# Patient Record
Sex: Male | Born: 1940 | Race: White | Hispanic: No | Marital: Married | Smoking: Former smoker
Health system: Southern US, Community
[De-identification: ages and names within clinical notes are randomized; demographics above are authoritative.]

## PROBLEM LIST (undated history)

## (undated) DIAGNOSIS — E119 Type 2 diabetes mellitus without complications: Secondary | ICD-10-CM

## (undated) DIAGNOSIS — M6281 Muscle weakness (generalized): Secondary | ICD-10-CM

## (undated) DIAGNOSIS — E7800 Pure hypercholesterolemia, unspecified: Secondary | ICD-10-CM

## (undated) DIAGNOSIS — Z77098 Contact with and (suspected) exposure to other hazardous, chiefly nonmedicinal, chemicals: Secondary | ICD-10-CM

## (undated) DIAGNOSIS — Z7739 Contact with and (suspected) exposure to other war theater: Secondary | ICD-10-CM

## (undated) DIAGNOSIS — Z86010 Personal history of colonic polyps: Secondary | ICD-10-CM

## (undated) DIAGNOSIS — Z860101 Personal history of adenomatous and serrated colon polyps: Secondary | ICD-10-CM

## (undated) DIAGNOSIS — C4490 Unspecified malignant neoplasm of skin, unspecified: Secondary | ICD-10-CM

## (undated) DIAGNOSIS — I872 Venous insufficiency (chronic) (peripheral): Secondary | ICD-10-CM

## (undated) DIAGNOSIS — J189 Pneumonia, unspecified organism: Secondary | ICD-10-CM

## (undated) DIAGNOSIS — I739 Peripheral vascular disease, unspecified: Secondary | ICD-10-CM

## (undated) DIAGNOSIS — E785 Hyperlipidemia, unspecified: Secondary | ICD-10-CM

## (undated) DIAGNOSIS — E559 Vitamin D deficiency, unspecified: Secondary | ICD-10-CM

## (undated) DIAGNOSIS — R0600 Dyspnea, unspecified: Secondary | ICD-10-CM

## (undated) DIAGNOSIS — Z8551 Personal history of malignant neoplasm of bladder: Secondary | ICD-10-CM

## (undated) DIAGNOSIS — N529 Male erectile dysfunction, unspecified: Secondary | ICD-10-CM

## (undated) DIAGNOSIS — E669 Obesity, unspecified: Secondary | ICD-10-CM

## (undated) DIAGNOSIS — Z961 Presence of intraocular lens: Secondary | ICD-10-CM

## (undated) DIAGNOSIS — I219 Acute myocardial infarction, unspecified: Secondary | ICD-10-CM

## (undated) DIAGNOSIS — N200 Calculus of kidney: Secondary | ICD-10-CM

## (undated) DIAGNOSIS — I2510 Atherosclerotic heart disease of native coronary artery without angina pectoris: Secondary | ICD-10-CM

## (undated) DIAGNOSIS — E1151 Type 2 diabetes mellitus with diabetic peripheral angiopathy without gangrene: Secondary | ICD-10-CM

## (undated) DIAGNOSIS — R32 Unspecified urinary incontinence: Secondary | ICD-10-CM

## (undated) DIAGNOSIS — D696 Thrombocytopenia, unspecified: Secondary | ICD-10-CM

## (undated) DIAGNOSIS — I10 Essential (primary) hypertension: Secondary | ICD-10-CM

## (undated) DIAGNOSIS — R062 Wheezing: Secondary | ICD-10-CM

## (undated) DIAGNOSIS — R809 Proteinuria, unspecified: Secondary | ICD-10-CM

## (undated) DIAGNOSIS — J449 Chronic obstructive pulmonary disease, unspecified: Secondary | ICD-10-CM

## (undated) HISTORY — DX: Venous insufficiency (chronic) (peripheral): I87.2

## (undated) HISTORY — DX: Dyspnea, unspecified: R06.00

## (undated) HISTORY — DX: Unspecified malignant neoplasm of skin, unspecified: C44.90

## (undated) HISTORY — DX: Peripheral vascular disease, unspecified: I73.9

## (undated) HISTORY — DX: Obesity, unspecified: E66.9

## (undated) HISTORY — DX: Male erectile dysfunction, unspecified: N52.9

## (undated) HISTORY — DX: Unspecified urinary incontinence: R32

## (undated) HISTORY — DX: Type 2 diabetes mellitus with diabetic peripheral angiopathy without gangrene: E11.51

## (undated) HISTORY — DX: Personal history of colonic polyps: Z86.010

## (undated) HISTORY — DX: Personal history of adenomatous and serrated colon polyps: Z86.0101

## (undated) HISTORY — DX: Proteinuria, unspecified: R80.9

## (undated) HISTORY — DX: Essential (primary) hypertension: I10

## (undated) HISTORY — DX: Hyperlipidemia, unspecified: E78.5

## (undated) HISTORY — DX: Personal history of malignant neoplasm of bladder: Z85.51

## (undated) HISTORY — DX: Contact with and (suspected) exposure to other hazardous, chiefly nonmedicinal, chemicals: Z77.098

## (undated) HISTORY — PX: OTHER SURGICAL HISTORY: SHX169

## (undated) HISTORY — DX: Pure hypercholesterolemia, unspecified: E78.00

## (undated) HISTORY — DX: Calculus of kidney: N20.0

## (undated) HISTORY — DX: Thrombocytopenia, unspecified: D69.6

## (undated) HISTORY — DX: Vitamin D deficiency, unspecified: E55.9

## (undated) HISTORY — DX: Muscle weakness (generalized): M62.81

## (undated) HISTORY — DX: Wheezing: R06.2

## (undated) HISTORY — DX: Pneumonia, unspecified organism: J18.9

## (undated) HISTORY — DX: Atherosclerotic heart disease of native coronary artery without angina pectoris: I25.10

## (undated) HISTORY — DX: Acute myocardial infarction, unspecified: I21.9

## (undated) HISTORY — DX: Presence of intraocular lens: Z96.1

## (undated) HISTORY — DX: Contact with and (suspected) exposure to other war theater: Z77.39

## (undated) HISTORY — DX: Type 2 diabetes mellitus without complications: E11.9

## (undated) HISTORY — PX: CARDIAC CATHETERIZATION: SHX172

---

## 2010-09-09 ENCOUNTER — Encounter: Payer: Self-pay | Admitting: Interventional Cardiology

## 2011-01-25 ENCOUNTER — Emergency Department (HOSPITAL_BASED_OUTPATIENT_CLINIC_OR_DEPARTMENT_OTHER)
Admission: EM | Admit: 2011-01-25 | Discharge: 2011-01-25 | Disposition: A | Discharge status: Home / Self Care | Payer: Medicare Other | Attending: Emergency Medicine | Admitting: Emergency Medicine

## 2011-01-25 DIAGNOSIS — I10 Essential (primary) hypertension: Secondary | ICD-10-CM | POA: Insufficient documentation

## 2011-01-25 DIAGNOSIS — R220 Localized swelling, mass and lump, head: Secondary | ICD-10-CM | POA: Insufficient documentation

## 2011-01-25 DIAGNOSIS — I2510 Atherosclerotic heart disease of native coronary artery without angina pectoris: Secondary | ICD-10-CM | POA: Insufficient documentation

## 2011-01-25 DIAGNOSIS — Z79899 Other long term (current) drug therapy: Secondary | ICD-10-CM | POA: Insufficient documentation

## 2011-01-25 DIAGNOSIS — X58XXXA Exposure to other specified factors, initial encounter: Secondary | ICD-10-CM | POA: Insufficient documentation

## 2011-01-25 DIAGNOSIS — T783XXA Angioneurotic edema, initial encounter: Secondary | ICD-10-CM | POA: Insufficient documentation

## 2011-09-29 ENCOUNTER — Other Ambulatory Visit: Payer: Self-pay | Admitting: Gastroenterology

## 2011-09-29 DIAGNOSIS — Z87891 Personal history of nicotine dependence: Secondary | ICD-10-CM

## 2011-09-29 DIAGNOSIS — Z136 Encounter for screening for cardiovascular disorders: Secondary | ICD-10-CM

## 2011-10-01 ENCOUNTER — Ambulatory Visit
Admission: RE | Admit: 2011-10-01 | Discharge: 2011-10-01 | Disposition: A | Discharge status: Home / Self Care | Payer: PRIVATE HEALTH INSURANCE | Source: Ambulatory Visit | Attending: Gastroenterology | Admitting: Gastroenterology

## 2011-10-01 DIAGNOSIS — Z136 Encounter for screening for cardiovascular disorders: Secondary | ICD-10-CM

## 2011-10-01 DIAGNOSIS — Z87891 Personal history of nicotine dependence: Secondary | ICD-10-CM

## 2011-10-01 IMAGING — US US AORTA
1 series · 14 of 17 positions shown · non-contrast
Comparison: None.

CLINICAL DATA: Former smoker.  Screening for abdominal aortic
aneurysm.

ULTRASOUND OF ABDOMINAL AORTA
TECHNIQUE: Ultrasound examination of the abdominal aorta was
performed to evaluate for abdominal aortic aneurysm.

[Series 1: us aorta · 14 of 17 slices shown]
[im 1/17]
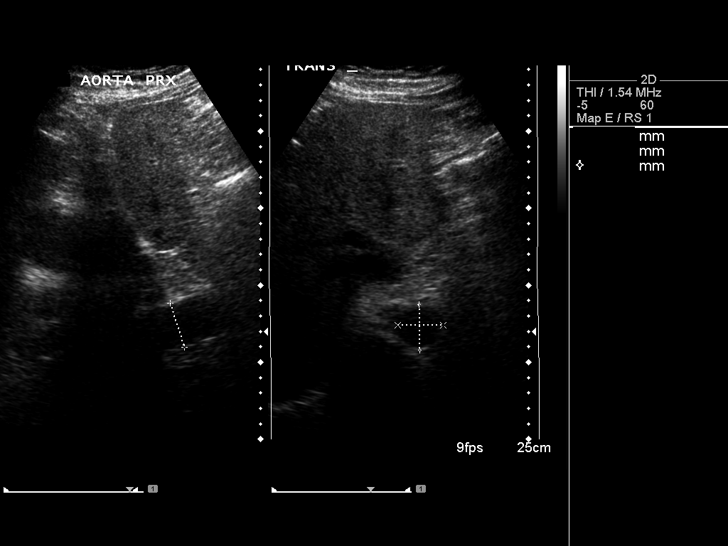
[im 2/17]
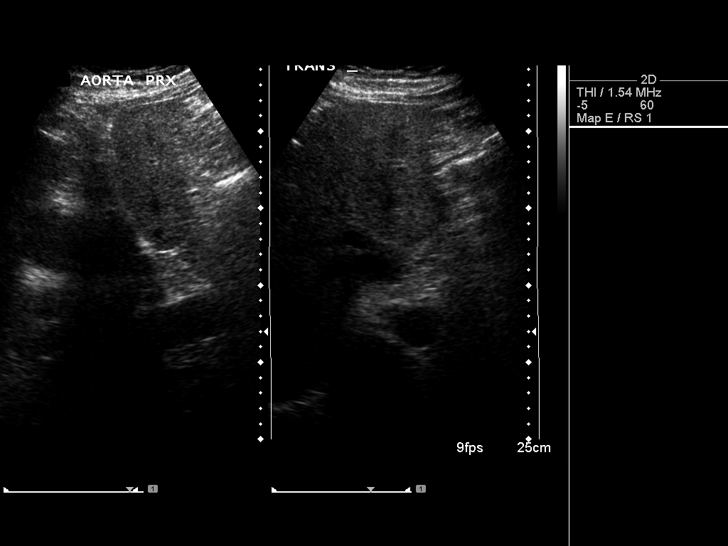
[im 4/17]
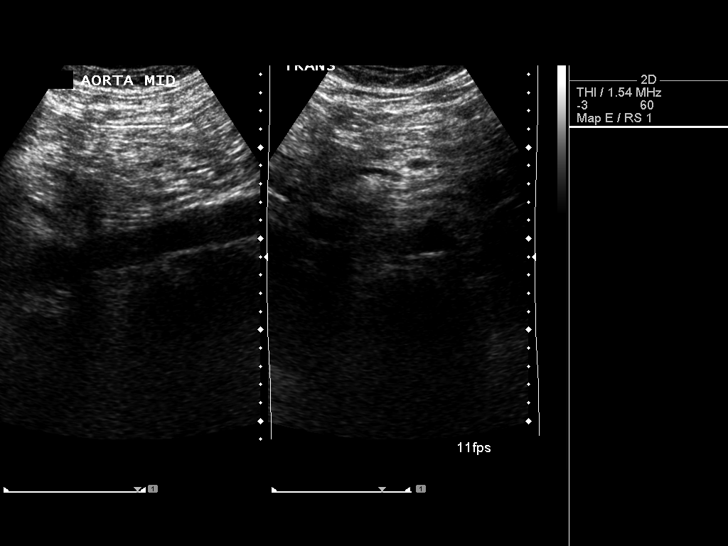
[im 5/17]
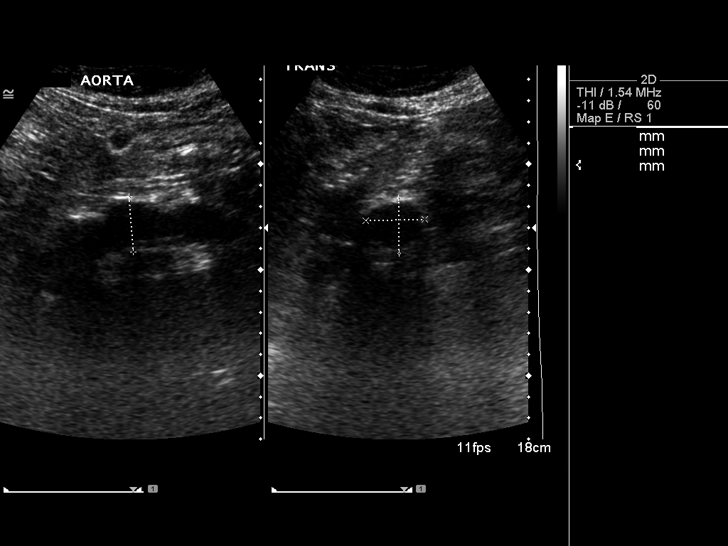
[im 6/17]
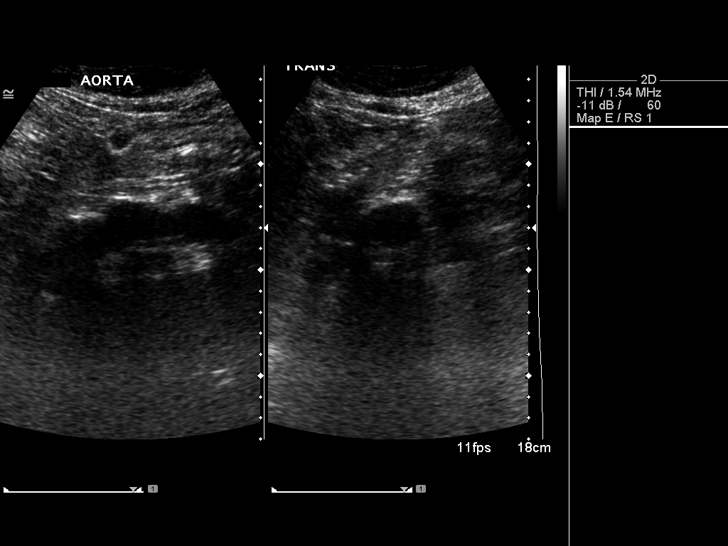
[im 7/17]
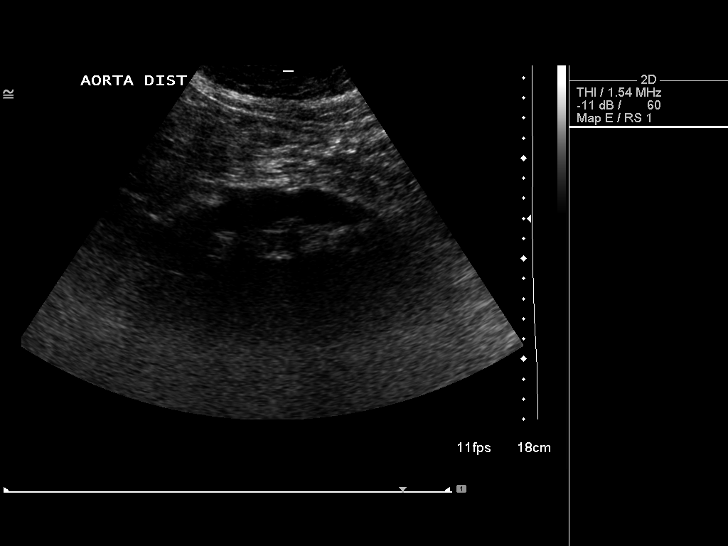
[im 8/17]
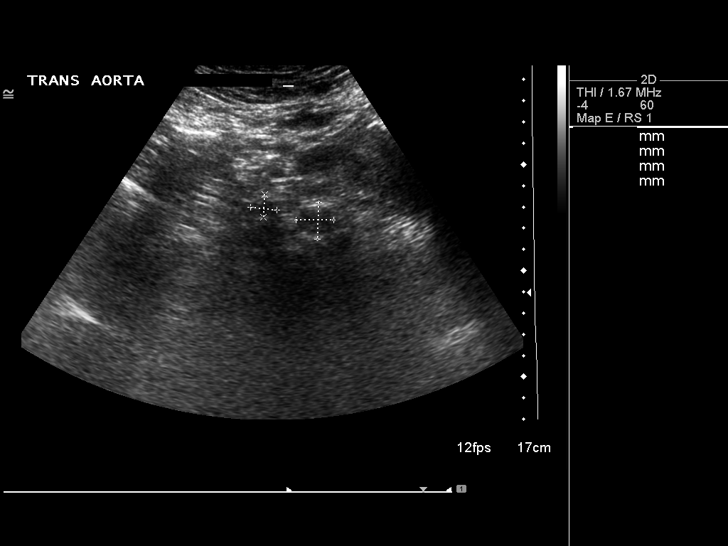
[im 10/17]
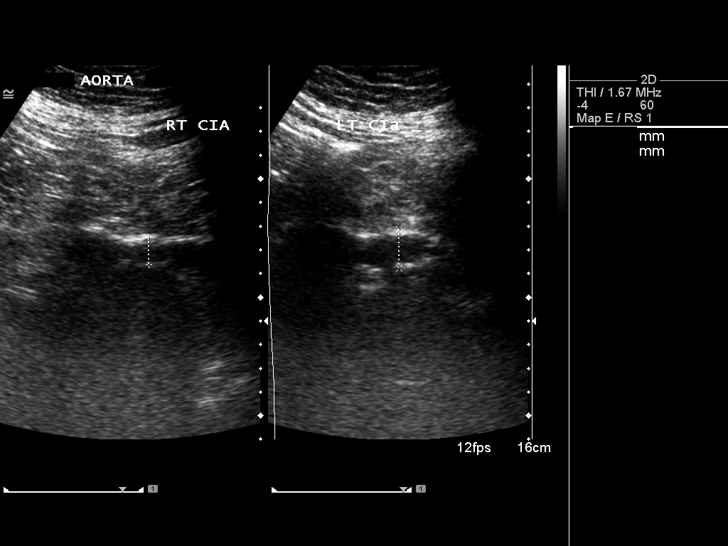
[im 11/17]
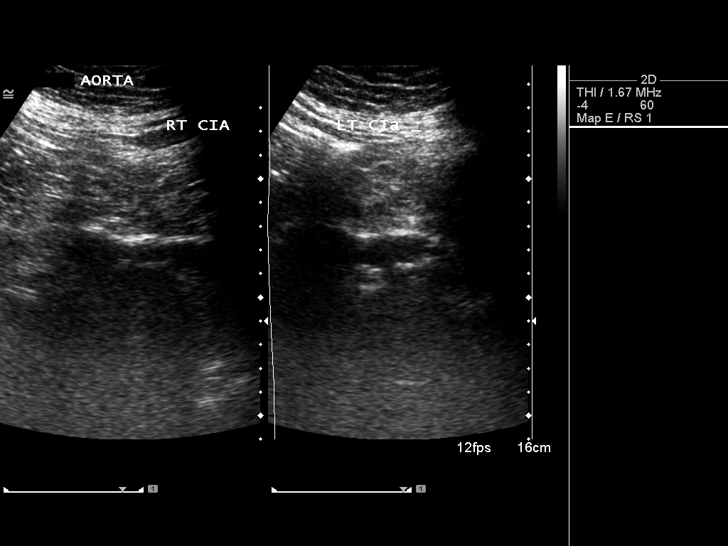
[im 12/17]
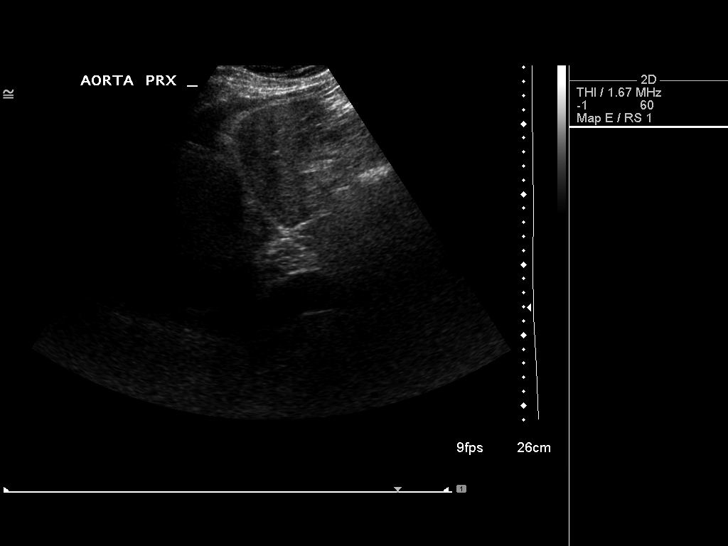
[im 13/17]
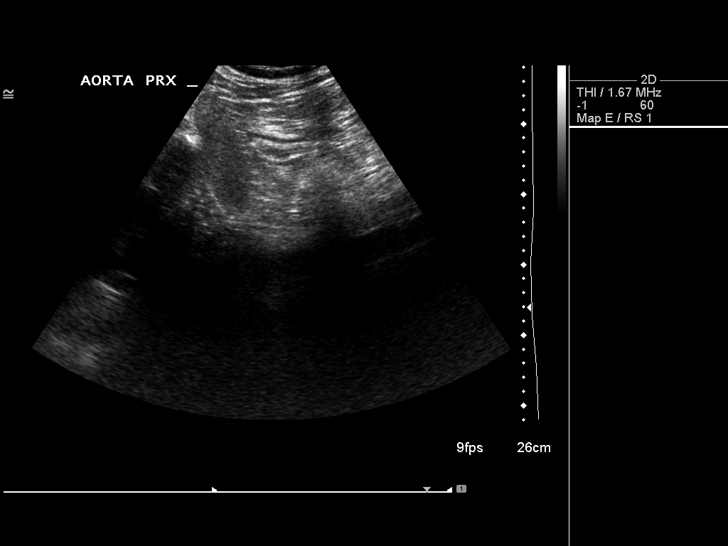
[im 14/17]
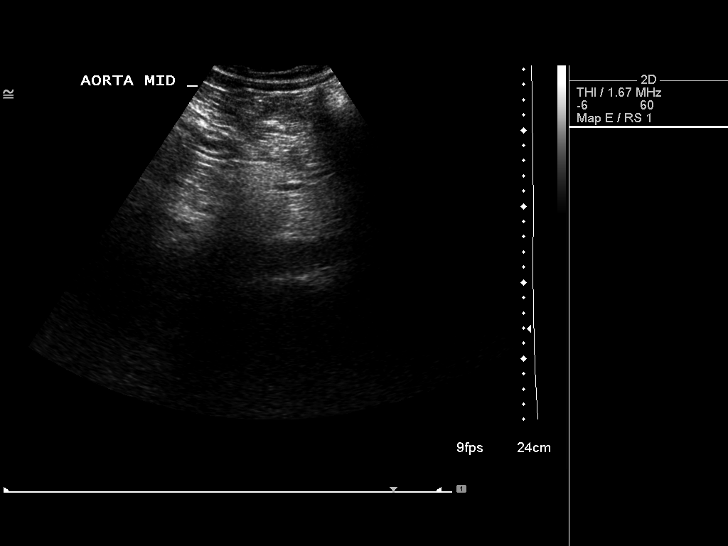
[im 16/17]
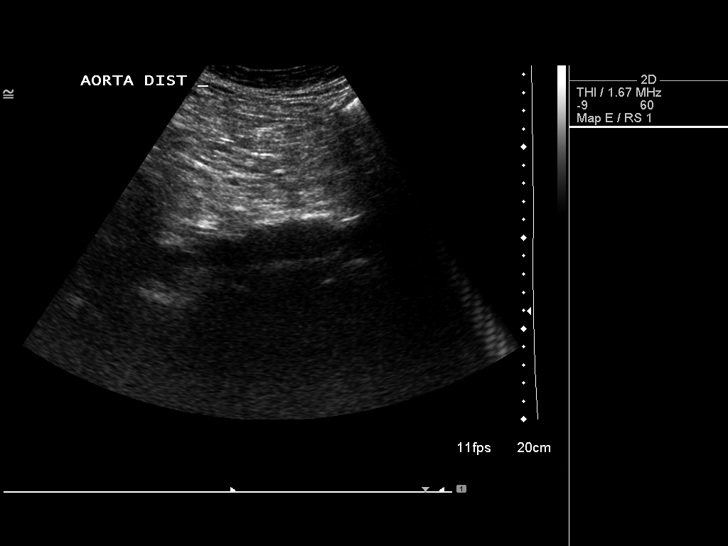
[im 17/17]
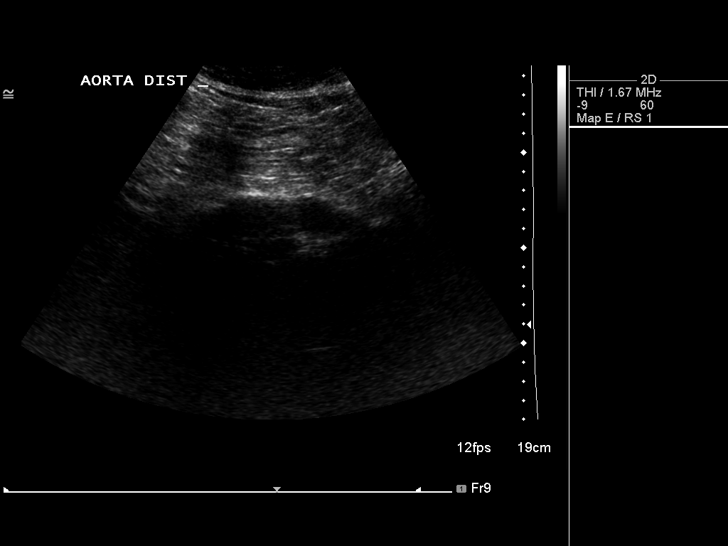

[14 of 17 positions shown; findings below may reference images not displayed]

Abdominal Aorta:  There is anatomic tapering of the abdominal
aorta.  Proximal aorta is 3 cm maximal diameter.  There is
atherosclerosis in the proximal abdominal aorta.  There is tapering
in the mid abdominal aorta.  There is a small aneurysm of the
distal abdominal aorta measuring 28 mm maximally.  This constitutes
aneurysmal dilation based on expansion of the aorta from 24 mm in
the mid abdominal aorta to 28 mm distally.

      Maximum AP diameter:  Distal aneurysm measures 2.6 cm.
      Maximum TRV diameter:  2.8 cm.
IMPRESSION: Small infrarenal of the distal abdominal aortic aneurysm maximally
measuring 26 mm x 28 mm.

## 2011-10-08 ENCOUNTER — Other Ambulatory Visit: Payer: Self-pay | Admitting: Gastroenterology

## 2011-10-08 DIAGNOSIS — I719 Aortic aneurysm of unspecified site, without rupture: Secondary | ICD-10-CM

## 2011-12-10 DIAGNOSIS — J069 Acute upper respiratory infection, unspecified: Secondary | ICD-10-CM | POA: Diagnosis not present

## 2012-03-22 DIAGNOSIS — H1030 Unspecified acute conjunctivitis, unspecified eye: Secondary | ICD-10-CM | POA: Diagnosis not present

## 2012-03-29 DIAGNOSIS — I2510 Atherosclerotic heart disease of native coronary artery without angina pectoris: Secondary | ICD-10-CM | POA: Diagnosis not present

## 2012-03-29 DIAGNOSIS — E7800 Pure hypercholesterolemia, unspecified: Secondary | ICD-10-CM | POA: Diagnosis not present

## 2012-03-29 DIAGNOSIS — I10 Essential (primary) hypertension: Secondary | ICD-10-CM | POA: Diagnosis not present

## 2012-04-07 DIAGNOSIS — R7989 Other specified abnormal findings of blood chemistry: Secondary | ICD-10-CM | POA: Diagnosis not present

## 2012-06-21 DIAGNOSIS — M999 Biomechanical lesion, unspecified: Secondary | ICD-10-CM | POA: Diagnosis not present

## 2012-06-21 DIAGNOSIS — M5137 Other intervertebral disc degeneration, lumbosacral region: Secondary | ICD-10-CM | POA: Diagnosis not present

## 2012-06-21 DIAGNOSIS — IMO0002 Reserved for concepts with insufficient information to code with codable children: Secondary | ICD-10-CM | POA: Diagnosis not present

## 2012-06-22 DIAGNOSIS — M999 Biomechanical lesion, unspecified: Secondary | ICD-10-CM | POA: Diagnosis not present

## 2012-06-22 DIAGNOSIS — IMO0002 Reserved for concepts with insufficient information to code with codable children: Secondary | ICD-10-CM | POA: Diagnosis not present

## 2012-06-22 DIAGNOSIS — M5137 Other intervertebral disc degeneration, lumbosacral region: Secondary | ICD-10-CM | POA: Diagnosis not present

## 2012-06-23 DIAGNOSIS — M999 Biomechanical lesion, unspecified: Secondary | ICD-10-CM | POA: Diagnosis not present

## 2012-06-23 DIAGNOSIS — IMO0002 Reserved for concepts with insufficient information to code with codable children: Secondary | ICD-10-CM | POA: Diagnosis not present

## 2012-06-23 DIAGNOSIS — M5137 Other intervertebral disc degeneration, lumbosacral region: Secondary | ICD-10-CM | POA: Diagnosis not present

## 2012-06-26 DIAGNOSIS — M999 Biomechanical lesion, unspecified: Secondary | ICD-10-CM | POA: Diagnosis not present

## 2012-06-26 DIAGNOSIS — IMO0002 Reserved for concepts with insufficient information to code with codable children: Secondary | ICD-10-CM | POA: Diagnosis not present

## 2012-06-26 DIAGNOSIS — M5137 Other intervertebral disc degeneration, lumbosacral region: Secondary | ICD-10-CM | POA: Diagnosis not present

## 2012-06-27 DIAGNOSIS — E119 Type 2 diabetes mellitus without complications: Secondary | ICD-10-CM | POA: Diagnosis not present

## 2012-06-28 DIAGNOSIS — M999 Biomechanical lesion, unspecified: Secondary | ICD-10-CM | POA: Diagnosis not present

## 2012-06-28 DIAGNOSIS — IMO0002 Reserved for concepts with insufficient information to code with codable children: Secondary | ICD-10-CM | POA: Diagnosis not present

## 2012-06-28 DIAGNOSIS — M5137 Other intervertebral disc degeneration, lumbosacral region: Secondary | ICD-10-CM | POA: Diagnosis not present

## 2012-06-29 DIAGNOSIS — IMO0002 Reserved for concepts with insufficient information to code with codable children: Secondary | ICD-10-CM | POA: Diagnosis not present

## 2012-06-29 DIAGNOSIS — M999 Biomechanical lesion, unspecified: Secondary | ICD-10-CM | POA: Diagnosis not present

## 2012-06-29 DIAGNOSIS — M5137 Other intervertebral disc degeneration, lumbosacral region: Secondary | ICD-10-CM | POA: Diagnosis not present

## 2012-07-04 DIAGNOSIS — M5137 Other intervertebral disc degeneration, lumbosacral region: Secondary | ICD-10-CM | POA: Diagnosis not present

## 2012-07-04 DIAGNOSIS — M999 Biomechanical lesion, unspecified: Secondary | ICD-10-CM | POA: Diagnosis not present

## 2012-07-04 DIAGNOSIS — IMO0002 Reserved for concepts with insufficient information to code with codable children: Secondary | ICD-10-CM | POA: Diagnosis not present

## 2012-07-07 DIAGNOSIS — R809 Proteinuria, unspecified: Secondary | ICD-10-CM | POA: Diagnosis not present

## 2012-07-07 DIAGNOSIS — E119 Type 2 diabetes mellitus without complications: Secondary | ICD-10-CM | POA: Diagnosis not present

## 2012-07-07 DIAGNOSIS — T465X5A Adverse effect of other antihypertensive drugs, initial encounter: Secondary | ICD-10-CM | POA: Diagnosis not present

## 2012-07-07 DIAGNOSIS — I10 Essential (primary) hypertension: Secondary | ICD-10-CM | POA: Diagnosis not present

## 2012-07-25 DIAGNOSIS — D1801 Hemangioma of skin and subcutaneous tissue: Secondary | ICD-10-CM | POA: Diagnosis not present

## 2012-07-25 DIAGNOSIS — L570 Actinic keratosis: Secondary | ICD-10-CM | POA: Diagnosis not present

## 2012-07-25 DIAGNOSIS — D239 Other benign neoplasm of skin, unspecified: Secondary | ICD-10-CM | POA: Diagnosis not present

## 2012-07-25 DIAGNOSIS — L578 Other skin changes due to chronic exposure to nonionizing radiation: Secondary | ICD-10-CM | POA: Diagnosis not present

## 2012-07-25 DIAGNOSIS — D0420 Carcinoma in situ of skin of unspecified ear and external auricular canal: Secondary | ICD-10-CM | POA: Diagnosis not present

## 2012-07-25 DIAGNOSIS — C44221 Squamous cell carcinoma of skin of unspecified ear and external auricular canal: Secondary | ICD-10-CM | POA: Diagnosis not present

## 2012-08-14 DIAGNOSIS — R0602 Shortness of breath: Secondary | ICD-10-CM | POA: Diagnosis not present

## 2012-08-14 DIAGNOSIS — I714 Abdominal aortic aneurysm, without rupture: Secondary | ICD-10-CM | POA: Diagnosis not present

## 2012-08-14 DIAGNOSIS — F17200 Nicotine dependence, unspecified, uncomplicated: Secondary | ICD-10-CM | POA: Diagnosis not present

## 2012-08-14 DIAGNOSIS — I2510 Atherosclerotic heart disease of native coronary artery without angina pectoris: Secondary | ICD-10-CM | POA: Diagnosis not present

## 2012-10-02 ENCOUNTER — Other Ambulatory Visit: Payer: PRIVATE HEALTH INSURANCE

## 2012-10-17 ENCOUNTER — Ambulatory Visit
Admission: RE | Admit: 2012-10-17 | Discharge: 2012-10-17 | Disposition: A | Discharge status: Home / Self Care | Payer: Medicare Other | Source: Ambulatory Visit | Attending: Gastroenterology | Admitting: Gastroenterology

## 2012-10-17 DIAGNOSIS — I77811 Abdominal aortic ectasia: Secondary | ICD-10-CM | POA: Diagnosis not present

## 2012-10-17 DIAGNOSIS — I719 Aortic aneurysm of unspecified site, without rupture: Secondary | ICD-10-CM

## 2012-10-17 IMAGING — US US AORTA
1 series · 14 of 23 positions shown · non-contrast
Comparison: 10/01/2011

CLINICAL DATA: Abdominal aortic atherosclerosis, history smoking,
the small aortic aneurysm

ULTRASOUND OF ABDOMINAL AORTA
TECHNIQUE: Ultrasound examination of the abdominal aorta was
performed to evaluate for abdominal aortic aneurysm.

[Series 1: us aorta · 0.34mm/px · 14 of 23 slices shown]
[im 1/23]
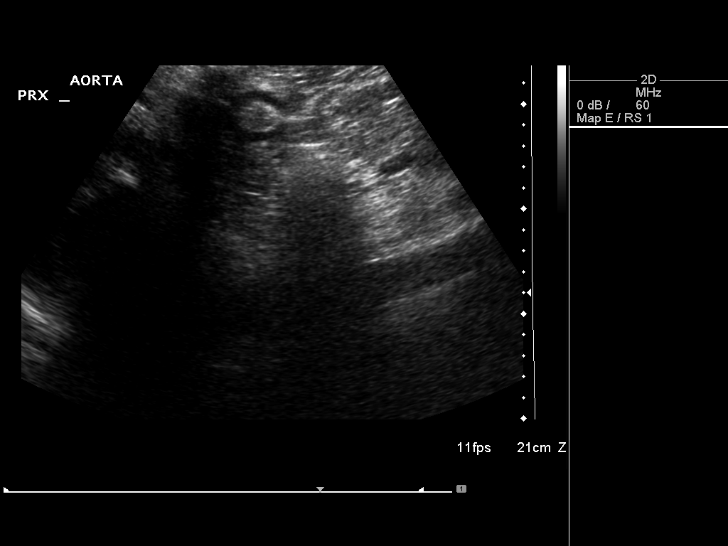
[im 3/23]
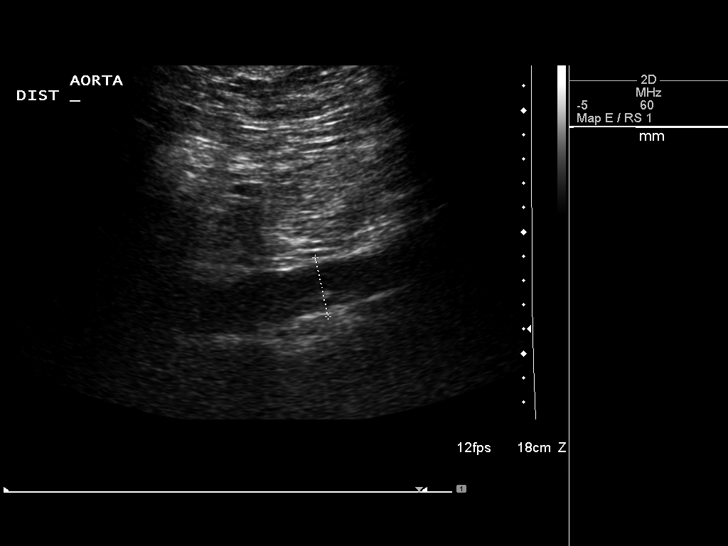
[im 5/23]
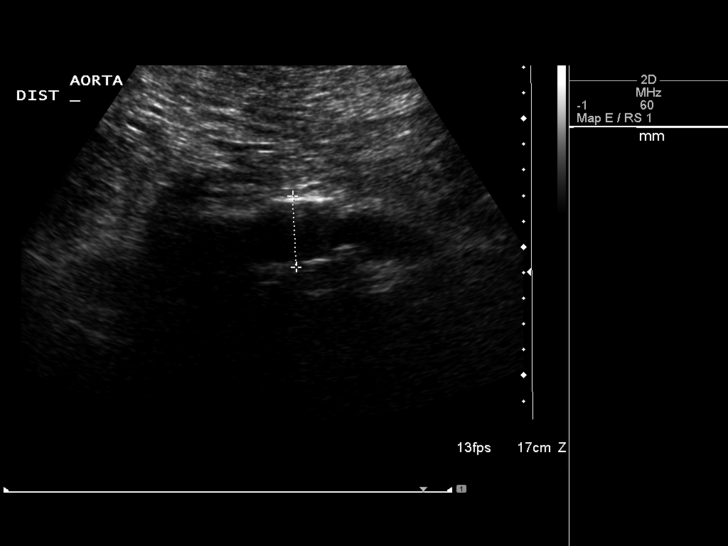
[im 6/23]
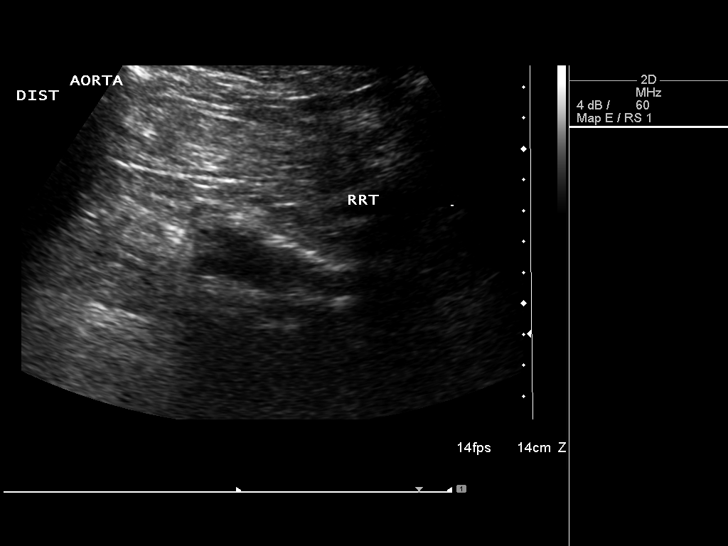
[im 8/23]
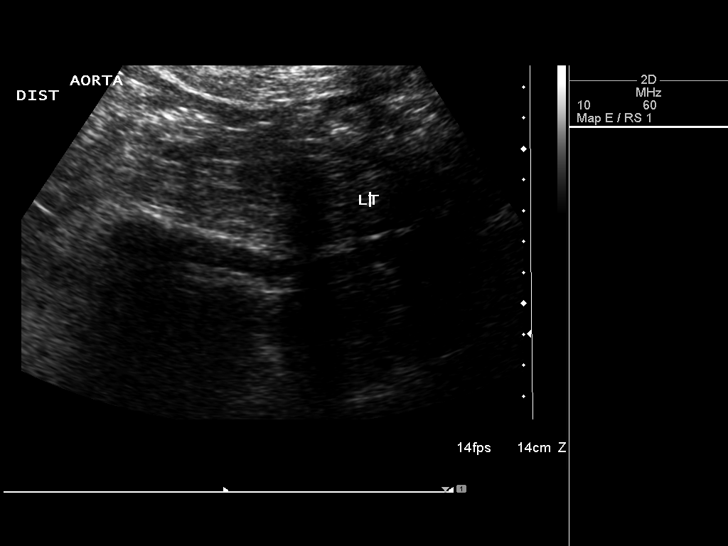
[im 10/23]
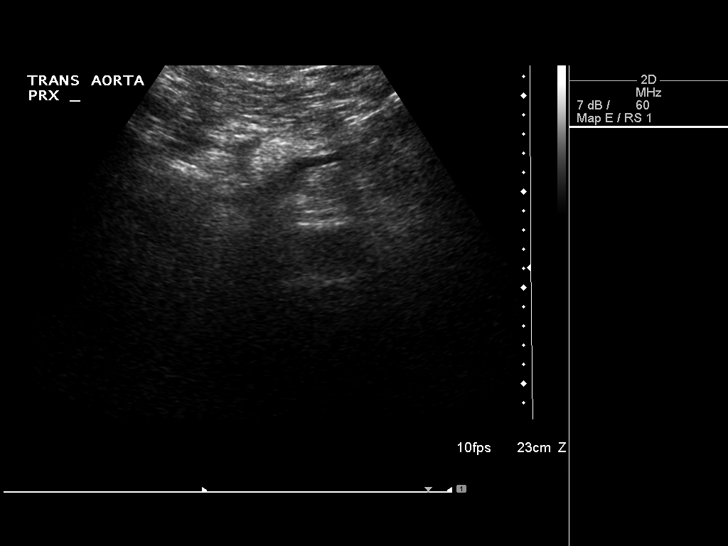
[im 11/23]
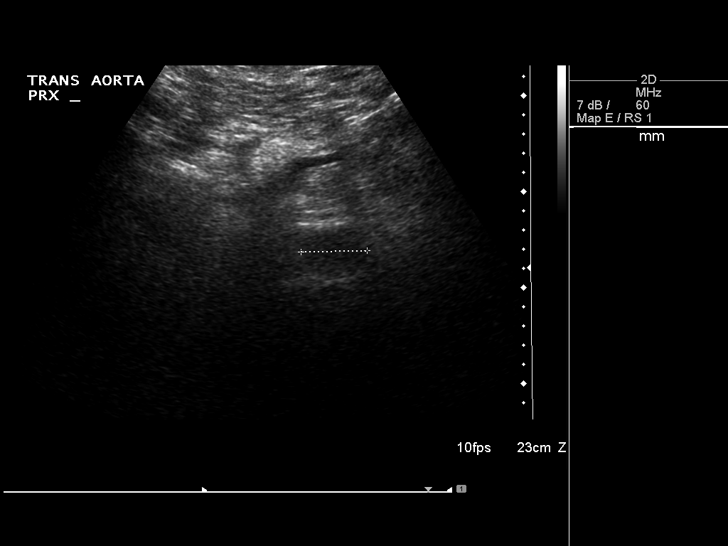
[im 13/23]
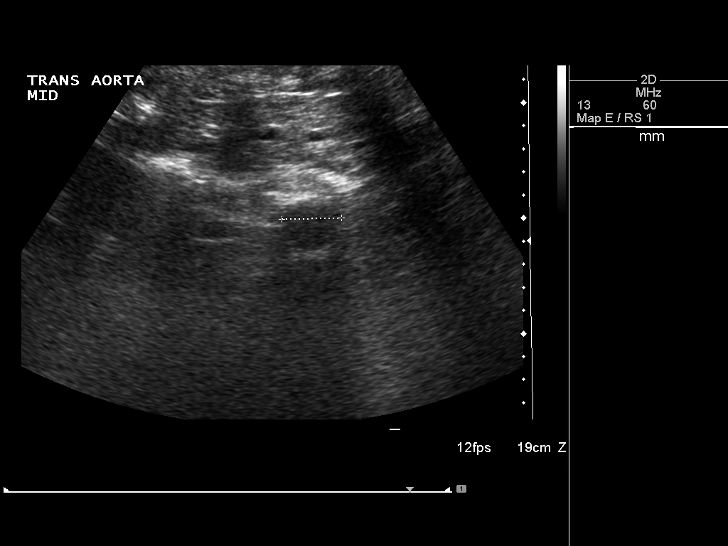
[im 14/23]
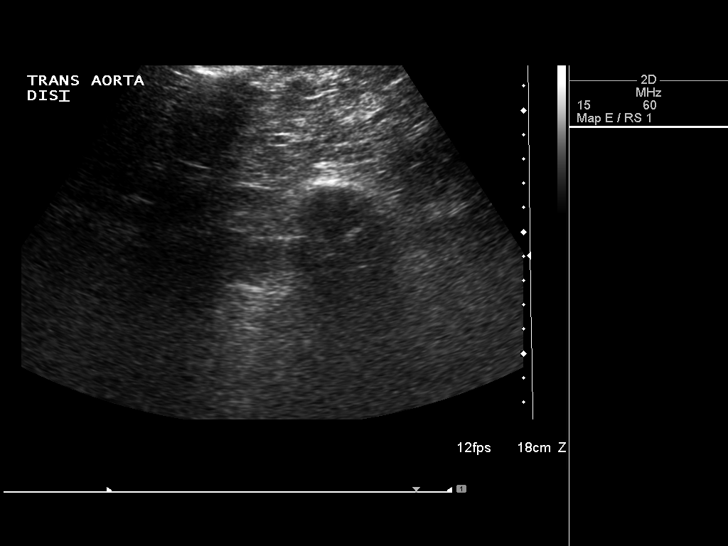
[im 16/23]
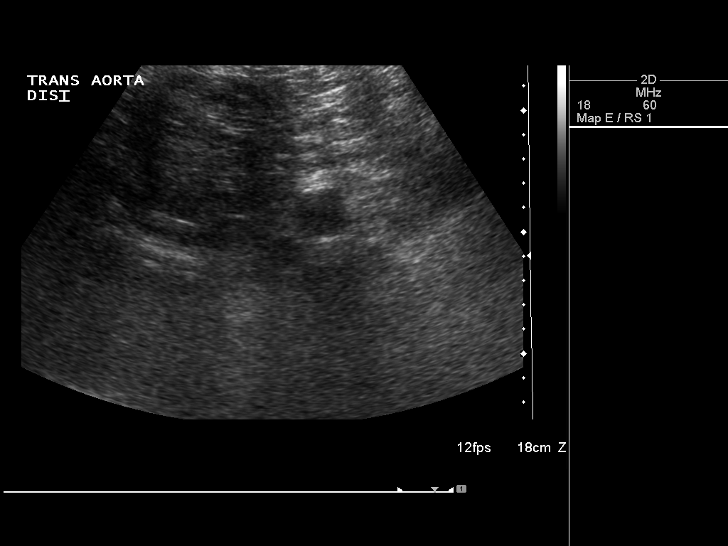
[im 18/23]
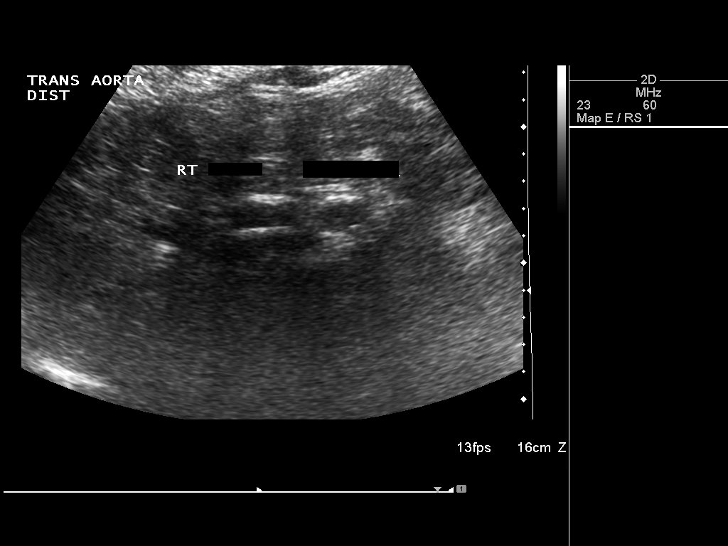
[im 19/23]
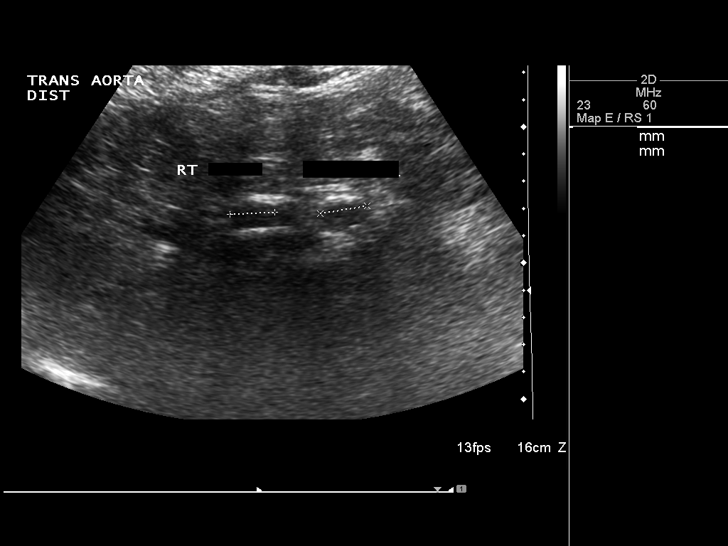
[im 21/23]
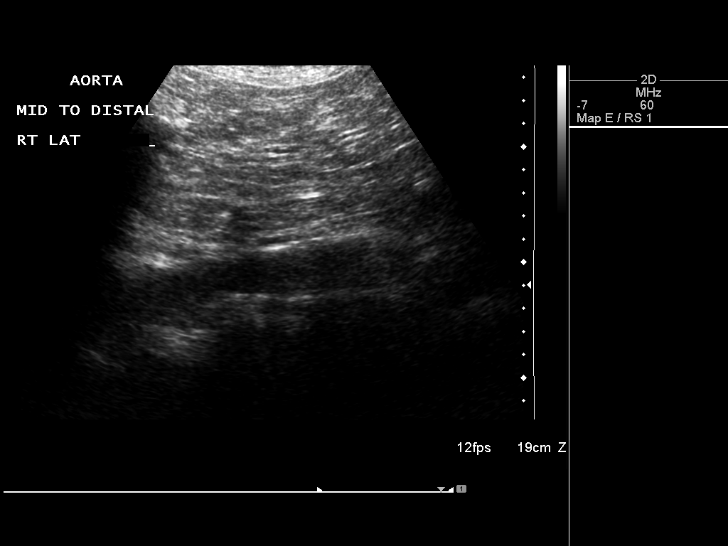
[im 23/23]
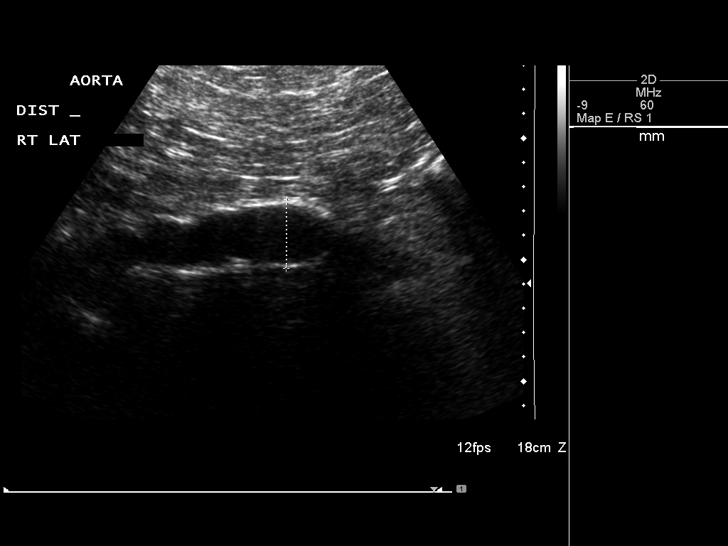

[14 of 23 positions shown; findings below may reference images not displayed]

Abdominal Aorta:  Diffuse atherosclerotic changes.  Mild ectasia
distally.  Stable appearance.

      Maximum AP diameter:  2.9 cm.
      Maximum TRV diameter:  3.5 cm.

Right common iliac maximal diameter:  1.2 cm
Left common iliac maximal diameter 1.2 cm.
IMPRESSION: Stable distal abdominal aortic mild ectasia.  No interval change.

## 2012-11-06 DIAGNOSIS — L570 Actinic keratosis: Secondary | ICD-10-CM | POA: Diagnosis not present

## 2012-11-06 DIAGNOSIS — L578 Other skin changes due to chronic exposure to nonionizing radiation: Secondary | ICD-10-CM | POA: Diagnosis not present

## 2012-11-06 DIAGNOSIS — Z85828 Personal history of other malignant neoplasm of skin: Secondary | ICD-10-CM | POA: Diagnosis not present

## 2012-11-06 DIAGNOSIS — L819 Disorder of pigmentation, unspecified: Secondary | ICD-10-CM | POA: Diagnosis not present

## 2012-11-06 DIAGNOSIS — D1801 Hemangioma of skin and subcutaneous tissue: Secondary | ICD-10-CM | POA: Diagnosis not present

## 2012-11-06 DIAGNOSIS — L738 Other specified follicular disorders: Secondary | ICD-10-CM | POA: Diagnosis not present

## 2012-11-06 DIAGNOSIS — L821 Other seborrheic keratosis: Secondary | ICD-10-CM | POA: Diagnosis not present

## 2013-01-01 DIAGNOSIS — R809 Proteinuria, unspecified: Secondary | ICD-10-CM | POA: Diagnosis not present

## 2013-01-01 DIAGNOSIS — I10 Essential (primary) hypertension: Secondary | ICD-10-CM | POA: Diagnosis not present

## 2013-01-01 DIAGNOSIS — E7800 Pure hypercholesterolemia, unspecified: Secondary | ICD-10-CM | POA: Diagnosis not present

## 2013-02-07 DIAGNOSIS — Z961 Presence of intraocular lens: Secondary | ICD-10-CM | POA: Diagnosis not present

## 2013-05-21 DIAGNOSIS — Z85828 Personal history of other malignant neoplasm of skin: Secondary | ICD-10-CM | POA: Diagnosis not present

## 2013-05-21 DIAGNOSIS — L819 Disorder of pigmentation, unspecified: Secondary | ICD-10-CM | POA: Diagnosis not present

## 2013-05-21 DIAGNOSIS — L821 Other seborrheic keratosis: Secondary | ICD-10-CM | POA: Diagnosis not present

## 2013-05-21 DIAGNOSIS — L570 Actinic keratosis: Secondary | ICD-10-CM | POA: Diagnosis not present

## 2013-05-21 DIAGNOSIS — L578 Other skin changes due to chronic exposure to nonionizing radiation: Secondary | ICD-10-CM | POA: Diagnosis not present

## 2013-05-22 DIAGNOSIS — J40 Bronchitis, not specified as acute or chronic: Secondary | ICD-10-CM | POA: Diagnosis not present

## 2013-05-29 ENCOUNTER — Encounter: Payer: Self-pay | Admitting: Interventional Cardiology

## 2013-05-29 DIAGNOSIS — F17200 Nicotine dependence, unspecified, uncomplicated: Secondary | ICD-10-CM | POA: Diagnosis not present

## 2013-05-29 DIAGNOSIS — I714 Abdominal aortic aneurysm, without rupture: Secondary | ICD-10-CM | POA: Diagnosis not present

## 2013-05-29 DIAGNOSIS — R0602 Shortness of breath: Secondary | ICD-10-CM | POA: Diagnosis not present

## 2013-05-29 DIAGNOSIS — I2510 Atherosclerotic heart disease of native coronary artery without angina pectoris: Secondary | ICD-10-CM | POA: Diagnosis not present

## 2013-05-30 DIAGNOSIS — J441 Chronic obstructive pulmonary disease with (acute) exacerbation: Secondary | ICD-10-CM | POA: Diagnosis not present

## 2013-06-15 DIAGNOSIS — J069 Acute upper respiratory infection, unspecified: Secondary | ICD-10-CM | POA: Diagnosis not present

## 2013-06-15 DIAGNOSIS — R062 Wheezing: Secondary | ICD-10-CM | POA: Diagnosis not present

## 2013-06-15 DIAGNOSIS — I10 Essential (primary) hypertension: Secondary | ICD-10-CM | POA: Diagnosis not present

## 2013-07-02 DIAGNOSIS — R809 Proteinuria, unspecified: Secondary | ICD-10-CM | POA: Diagnosis not present

## 2013-07-02 DIAGNOSIS — I10 Essential (primary) hypertension: Secondary | ICD-10-CM | POA: Diagnosis not present

## 2013-07-02 DIAGNOSIS — Z87891 Personal history of nicotine dependence: Secondary | ICD-10-CM | POA: Diagnosis not present

## 2013-07-02 DIAGNOSIS — E7800 Pure hypercholesterolemia, unspecified: Secondary | ICD-10-CM | POA: Diagnosis not present

## 2013-07-02 DIAGNOSIS — E119 Type 2 diabetes mellitus without complications: Secondary | ICD-10-CM | POA: Diagnosis not present

## 2013-07-02 DIAGNOSIS — Z86010 Personal history of colonic polyps: Secondary | ICD-10-CM | POA: Diagnosis not present

## 2013-07-02 DIAGNOSIS — I2510 Atherosclerotic heart disease of native coronary artery without angina pectoris: Secondary | ICD-10-CM | POA: Diagnosis not present

## 2013-07-02 DIAGNOSIS — Z0000 Encounter for general adult medical examination without abnormal findings: Secondary | ICD-10-CM | POA: Diagnosis not present

## 2013-07-12 DIAGNOSIS — E875 Hyperkalemia: Secondary | ICD-10-CM | POA: Diagnosis not present

## 2013-07-30 ENCOUNTER — Encounter: Payer: Self-pay | Admitting: Interventional Cardiology

## 2013-07-30 DIAGNOSIS — E7800 Pure hypercholesterolemia, unspecified: Secondary | ICD-10-CM | POA: Diagnosis not present

## 2013-07-30 DIAGNOSIS — R809 Proteinuria, unspecified: Secondary | ICD-10-CM | POA: Diagnosis not present

## 2013-07-30 DIAGNOSIS — R062 Wheezing: Secondary | ICD-10-CM | POA: Diagnosis not present

## 2013-07-30 DIAGNOSIS — I10 Essential (primary) hypertension: Secondary | ICD-10-CM | POA: Diagnosis not present

## 2013-08-08 ENCOUNTER — Encounter: Payer: Self-pay | Admitting: Critical Care Medicine

## 2013-08-09 ENCOUNTER — Ambulatory Visit (INDEPENDENT_AMBULATORY_CARE_PROVIDER_SITE_OTHER): Payer: Medicare Other | Admitting: Critical Care Medicine

## 2013-08-09 ENCOUNTER — Encounter: Payer: Self-pay | Admitting: Critical Care Medicine

## 2013-08-09 VITALS — BP 150/86 | HR 61 | Temp 97.8°F | Ht 70.0 in | Wt 230.0 lb

## 2013-08-09 DIAGNOSIS — G4733 Obstructive sleep apnea (adult) (pediatric): Secondary | ICD-10-CM | POA: Insufficient documentation

## 2013-08-09 DIAGNOSIS — J438 Other emphysema: Secondary | ICD-10-CM | POA: Diagnosis not present

## 2013-08-09 DIAGNOSIS — E1151 Type 2 diabetes mellitus with diabetic peripheral angiopathy without gangrene: Secondary | ICD-10-CM | POA: Insufficient documentation

## 2013-08-09 DIAGNOSIS — E119 Type 2 diabetes mellitus without complications: Secondary | ICD-10-CM | POA: Insufficient documentation

## 2013-08-09 DIAGNOSIS — Z23 Encounter for immunization: Secondary | ICD-10-CM

## 2013-08-09 DIAGNOSIS — E785 Hyperlipidemia, unspecified: Secondary | ICD-10-CM

## 2013-08-09 DIAGNOSIS — E669 Obesity, unspecified: Secondary | ICD-10-CM | POA: Insufficient documentation

## 2013-08-09 DIAGNOSIS — I10 Essential (primary) hypertension: Secondary | ICD-10-CM | POA: Insufficient documentation

## 2013-08-09 DIAGNOSIS — Z8551 Personal history of malignant neoplasm of bladder: Secondary | ICD-10-CM | POA: Insufficient documentation

## 2013-08-09 DIAGNOSIS — Z85828 Personal history of other malignant neoplasm of skin: Secondary | ICD-10-CM | POA: Insufficient documentation

## 2013-08-09 DIAGNOSIS — I2510 Atherosclerotic heart disease of native coronary artery without angina pectoris: Secondary | ICD-10-CM | POA: Insufficient documentation

## 2013-08-09 DIAGNOSIS — J439 Emphysema, unspecified: Secondary | ICD-10-CM

## 2013-08-09 DIAGNOSIS — C4490 Unspecified malignant neoplasm of skin, unspecified: Secondary | ICD-10-CM | POA: Insufficient documentation

## 2013-08-09 DIAGNOSIS — N529 Male erectile dysfunction, unspecified: Secondary | ICD-10-CM | POA: Insufficient documentation

## 2013-08-09 HISTORY — DX: Emphysema, unspecified: J43.9

## 2013-08-09 HISTORY — DX: Obstructive sleep apnea (adult) (pediatric): G47.33

## 2013-08-09 MED ORDER — BUDESONIDE-FORMOTEROL FUMARATE 160-4.5 MCG/ACT IN AERO: 2.0000 | INHALATION_SPRAY | Freq: Two times a day (BID) | RESPIRATORY_TRACT | Status: DC

## 2013-08-09 NOTE — Assessment & Plan Note (Signed)
History most compatible with obstructive sleep apnea Plan Obtain sleep study 

## 2013-08-09 NOTE — Assessment & Plan Note (Signed)
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

## 2013-08-09 NOTE — Progress Notes (Signed)
Subjective:    Patient ID: Steven Newman, male    DOB: 11-30-1941, 72 y.o.   MRN: [id number]  HPI Comments: Dyspnea and wheezing started June, 2014.  Similar issues prior year.  Wrose if gets a URI and ppts.   Shortness of Breath This is a new problem. The current episode started more than 1 month ago. The problem occurs daily (dyspnea exertion, occ at rest). The problem has been rapidly improving. Associated symptoms include leg swelling, sputum production and wheezing. Pertinent negatives include no abdominal pain, chest pain, claudication, coryza, ear pain, fever, headaches, hemoptysis, leg pain, neck pain, orthopnea, PND, rash, rhinorrhea, sore throat, swollen glands, syncope or vomiting. The symptoms are aggravated by any activity, exercise, weather changes and URIs. Associated symptoms comments: Pt did have a cough but now is gone, mucus was present when ill yellow /green Hx of heartburn esp with certain foods Pt stays hoarse. Risk factors include smoking. He has tried beta agonist inhalers and oral steroids (ABX, several rounds of ABX/Steroids in combination over 5 weeks) for the symptoms. The treatment provided significant relief. His past medical history is significant for CAD and pneumonia. There is no history of allergies, aspirin allergies, asthma, bronchiolitis, chronic lung disease, COPD, DVT, a heart failure, PE or a recent surgery.     Past Medical History  Diagnosis Date  . Hx of colonic polyps   . Hyperlipidemia   . Erectile dysfunction   . Hypertension   . MI (myocardial infarction)     1976, 1985  . CAD (coronary artery disease)     coronary angioplasty 1995  . Obesity   . Pneumonia   . History of bladder cancer   . Skin cancer   . Kidney stones   . Dyspnea   . Wheezing   . Type 2 diabetes mellitus      Family History  Problem Relation Age of Onset  . Heart disease Mother   . Rheumatic fever Father   . Rheumatic fever Brother   . Diabetes Sister   . Cancer  Sister     cancer on eyelid     History   Social History  . Marital Status: Married    Spouse Name: N/A    Number of Children: N/A  . Years of Education: N/A   Occupational History  . Retired    Social History Main Topics  . Smoking status: Former Smoker -- 2.00 packs/day for 50 years    Types: Cigarettes    Quit date: 07/06/2013  . Smokeless tobacco: Never Used  . Alcohol Use: Yes     Comment: socially  . Drug Use: No  . Sexual Activity: Not on file   Other Topics Concern  . Not on file   Social History Narrative  . No narrative on file     Allergies  Allergen Reactions  . Ace Inhibitors     Angioedema      Outpatient Prescriptions Prior to Visit  Medication Sig Dispense Refill  . albuterol (PROVENTIL HFA;VENTOLIN HFA) 108 (90 BASE) MCG/ACT inhaler Inhale 2 puffs into the lungs every 4 (four) hours as needed for wheezing.      Marland Kitchen aspirin 81 MG tablet Take 81 mg by mouth daily.      Marland Kitchen atorvastatin (LIPITOR) 40 MG tablet Take 40 mg by mouth daily.      . cholecalciferol (VITAMIN D) 1000 UNITS tablet Take 1,000 Units by mouth daily.      . Coenzyme Q10 (CO Q-10)  100 MG CAPS Take 1 capsule by mouth daily.      . nadolol (CORGARD) 40 MG tablet Take 40 mg by mouth daily.      . nitroGLYCERIN (NITROSTAT) 0.4 MG SL tablet Place 0.4 mg under the tongue every 5 (five) minutes as needed for chest pain.      . Omega-3 Fatty Acids (FISH OIL) 1360 MG CAPS Take 1 capsule by mouth daily.      Marland Kitchen Spacer/Aero-Holding Chambers (AEROCHAMBER PLUS) inhaler 1 each by Other route once. Use as instructed      . atorvastatin (LIPITOR) 80 MG tablet Take 80 mg by mouth daily.       No facility-administered medications prior to visit.     Review of Systems  Constitutional: Negative for fever, chills, diaphoresis, activity change, appetite change, fatigue and unexpected weight change.  HENT: Positive for congestion, sneezing and postnasal drip. Negative for hearing loss, ear pain,  nosebleeds, sore throat, facial swelling, rhinorrhea, mouth sores, trouble swallowing, neck pain, neck stiffness, dental problem, voice change, sinus pressure, tinnitus and ear discharge.   Eyes: Negative for photophobia, discharge, itching and visual disturbance.  Respiratory: Positive for cough, sputum production, shortness of breath and wheezing. Negative for apnea, hemoptysis, choking, chest tightness and stridor.   Cardiovascular: Positive for leg swelling. Negative for chest pain, palpitations, orthopnea, claudication, syncope and PND.  Gastrointestinal: Negative for nausea, vomiting, abdominal pain, constipation, blood in stool and abdominal distention.  Genitourinary: Negative for dysuria, urgency, frequency, hematuria, flank pain, decreased urine volume and difficulty urinating.  Musculoskeletal: Positive for myalgias and arthralgias. Negative for back pain, joint swelling and gait problem.  Skin: Negative for color change, pallor and rash.  Neurological: Negative for dizziness, tremors, seizures, syncope, speech difficulty, weakness, light-headedness, numbness and headaches.  Hematological: Negative for adenopathy. Bruises/bleeds easily.  Psychiatric/Behavioral: Positive for sleep disturbance. Negative for confusion and agitation. The patient is not nervous/anxious.        Objective:   Physical Exam Filed Vitals:   08/09/13 0951  BP: 150/86  Pulse: 61  Temp: 97.8 F (36.6 C)  TempSrc: Oral  Height: 5\' 10"  (1.778 m)  Weight: 230 lb (104.327 kg)  SpO2: 95%    Gen: Pleasant, well-nourished, in no distress,  normal affect  ENT: No lesions,  mouth clear,  oropharynx clear, no postnasal drip  Neck: No JVD, no TMG, no carotid bruits  Lungs: No use of accessory muscles, no dullness to percussion, distant breath sounds  Cardiovascular: RRR, heart sounds normal, no murmur or gallops, no peripheral edema  Abdomen: soft and NT, no HSM,  BS normal  Musculoskeletal: No  deformities, no cyanosis or clubbing  Neuro: alert, non focal  Skin: Warm, no lesions or rashes  No results found. 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 albuterol (PROVENTIL HFA;VENTOLIN HFA)  108 (90 BASE) MCG/ACT inhaler Inhale 2 puffs into the lungs every 4 (four) hours as needed for wheezing.      Marland Kitchen aspirin 81 MG tablet Take 81 mg by mouth daily.      Marland Kitchen atorvastatin (LIPITOR) 40 MG tablet Take 40 mg by mouth daily.      . cholecalciferol (VITAMIN D) 1000 UNITS tablet Take 1,000 Units by mouth daily.      . Coenzyme Q10 (CO Q-10) 100 MG CAPS Take 1 capsule by mouth daily.      . nadolol (CORGARD) 40 MG tablet Take 40 mg by mouth daily.      . nitroGLYCERIN (NITROSTAT) 0.4 MG SL tablet Place 0.4 mg under the tongue every 5 (five) minutes as needed for chest pain.      . Omega-3 Fatty Acids (FISH OIL) 1360 MG CAPS Take 1 capsule by mouth daily.      Marland Kitchen PRESCRIPTION MEDICATION Triflex for arthritis as needed      . Spacer/Aero-Holding Chambers (AEROCHAMBER PLUS) inhaler 1 each by Other route once. Use as instructed      . budesonide-formoterol (SYMBICORT) 160-4.5 MCG/ACT inhaler Inhale 2 puffs into the lungs 2 (two) times daily.  1 Inhaler  12  . budesonide-formoterol (SYMBICORT) 160-4.5  MCG/ACT inhaler Inhale 2 puffs into the lungs 2 (two) times daily.  1 Inhaler  0  . [DISCONTINUED] atorvastatin (LIPITOR) 80 MG tablet Take 80 mg by mouth daily.       No facility-administered encounter medications on file as of 08/09/2013.

## 2013-08-09 NOTE — Patient Instructions (Addendum)
Start Symbicort two puff twice daily Use albuterol as needed A referral to pulmonary rehabiliation will be made Follow a reflux diet A sleep study referral will be made A flu vaccine will be given Return 6 weeks

## 2013-08-22 ENCOUNTER — Telehealth: Payer: Self-pay | Admitting: Critical Care Medicine

## 2013-08-22 NOTE — Telephone Encounter (Signed)
Left msg with Shanda Bumps at [facility] for [place] [facility]  Pulm ZOXWR'U # is [phone]

## 2013-08-23 NOTE — Telephone Encounter (Signed)
ATC Phyllis @ number provided NO answer [email or web address]

## 2013-08-24 NOTE — Telephone Encounter (Signed)
ATC Phyllis, no answer [email or web address]

## 2013-08-27 NOTE — Telephone Encounter (Signed)
Called [facility], spoke with [name] as [name] isn't in. They have received the return fax that they faxed concerning this re: pt's diagnosis for pulm rehab. Per [name], nothing further is needed. Will sign off.

## 2013-09-05 DIAGNOSIS — K921 Melena: Secondary | ICD-10-CM | POA: Diagnosis not present

## 2013-09-05 DIAGNOSIS — Z86010 Personal history of colonic polyps: Secondary | ICD-10-CM | POA: Diagnosis not present

## 2013-09-05 DIAGNOSIS — K5730 Diverticulosis of large intestine without perforation or abscess without bleeding: Secondary | ICD-10-CM | POA: Diagnosis not present

## 2013-09-12 ENCOUNTER — Ambulatory Visit (HOSPITAL_BASED_OUTPATIENT_CLINIC_OR_DEPARTMENT_OTHER): Payer: Medicare Other | Attending: Critical Care Medicine | Admitting: Radiology

## 2013-09-12 VITALS — Ht 70.0 in | Wt 230.0 lb

## 2013-09-12 DIAGNOSIS — Z6833 Body mass index (BMI) 33.0-33.9, adult: Secondary | ICD-10-CM | POA: Diagnosis not present

## 2013-09-12 DIAGNOSIS — J449 Chronic obstructive pulmonary disease, unspecified: Secondary | ICD-10-CM | POA: Diagnosis not present

## 2013-09-12 DIAGNOSIS — G4733 Obstructive sleep apnea (adult) (pediatric): Secondary | ICD-10-CM

## 2013-09-12 DIAGNOSIS — J4489 Other specified chronic obstructive pulmonary disease: Secondary | ICD-10-CM | POA: Insufficient documentation

## 2013-09-12 DIAGNOSIS — E669 Obesity, unspecified: Secondary | ICD-10-CM | POA: Diagnosis not present

## 2013-09-12 DIAGNOSIS — R259 Unspecified abnormal involuntary movements: Secondary | ICD-10-CM | POA: Diagnosis not present

## 2013-09-14 ENCOUNTER — Telehealth: Payer: Self-pay | Admitting: Pulmonary Disease

## 2013-09-14 DIAGNOSIS — G4733 Obstructive sleep apnea (adult) (pediatric): Secondary | ICD-10-CM

## 2013-09-14 NOTE — Telephone Encounter (Signed)
He did not sleep much at all. Moderate obstructive sleep apnea was noted but would recommend home study ideally as a repeat , if clinical suspicion is high. Can make appt to discuss

## 2013-09-15 NOTE — Procedures (Signed)
Steven Newman, Steven Newman NO.:  [id number]  MEDICAL RECORD NO.:  [id number]          PATIENT TYPE:  OUT  LOCATION:  SLEEP CENTER                 FACILITY:  [facility]  PHYSICIAN:  Oretha Milch, MD      DATE OF BIRTH:  [birth year]  DATE OF STUDY:  09/12/2013                           NOCTURNAL POLYSOMNOGRAM  REFERRING PHYSICIAN:  Charlcie Cradle. Delford Field, MD, FCCP  INDICATION FOR STUDY:  Excessive daytime fatigue, loud snoring, and obesity in this 72 year old gentleman with COPD.  At the time of this study, he weighed 228 pounds with a height of 5 feet 10 inches, BMI of 33, neck size of 18 inches.  This nocturnal polysomnogram was performed with sleep technologist in attendance.  EEG, EOG, EMG, EKG, and respiratory parameters were recorded.  Sleep stages, arousals, limb movements, and respiratory data were scored according to criteria laid out by the American Academy of Sleep Medicine.  EPWORTH SLEEPINESS SCORE:  5.  SLEEP ARCHITECTURE:  Lights out was at 10:05 p.m., lights on was at 4:55 a.m.  Total sleep time was 78 minutes with a sleep period time of 314 minutes and a sleep efficiency of 19%.  Sleep latency was 93 minutes. REM sleep was not noted.  Wake after sleep onset was 239 minutes.  Sleep stages as a percentage of total sleep time was N1 58%, N2 42%.  Supine sleep was only noted for 1 minute.  AROUSAL DATA:  There were 98 arousals with an arousal index of 75 events per hour, of these 68 were spontaneous and the rest were associated with respiratory or limb movements.  RESPIRATORY DATA:  There were total of 10 obstructive apneas, 1 central apnea, 2 mixed apneas, and 5 hypopneas with an apnea-hypopnea index of 14 events per hour.  33 RERAs were noted with an RDI of 40 events per hour.  LIMB MOVEMENT DATA:  The limb movement index was 54 events per hour. The PLM arousal index was 11 events per hour.  OXYGEN DATA:  The desaturation index was 11 events per hour.   Lowest desaturation was 88%.  He spent 0 minutes with a saturation less than 88%.  CARDIAC DATA:  The low heart rate was 31 beats per minute.  The high heart rate recorded was an artifact.  No arrhythmias were noted.  DISCUSSION:  He did not meet criteria for intervention.  IMPRESSIONS: 1. Mild-to-moderate obstructive sleep apnea with predominant hypopneas     and respiratory effort related arousals. 2. Sleep efficiency was poor, frequent arousals were noted.  This may     be a first night effect. 3. Few periodic limb movements were noted with arousals, the     significance of this is unclear. 4. No evidence of cardiac arrhythmias or behavioral disturbance during     sleep.  RECOMMENDATIONS: 1. The treatment options for this degree of sleep-disordered breathing     include weight loss alone and/or CPAP therapy and oral appliance. 2. However, I would recommend a home study given the poor sleep     efficiency noted in this study. 3. He should be cautioned against driving when sleepy.  He should be  asked to avoid medications with sedative side effects.     Oretha Milch, MD    RVA/MEDQ  D:  09/14/2013 17:28:56  T:  09/15/2013 09:32:02  Job:  [id number]

## 2013-09-17 ENCOUNTER — Ambulatory Visit (INDEPENDENT_AMBULATORY_CARE_PROVIDER_SITE_OTHER): Payer: Medicare Other | Admitting: Critical Care Medicine

## 2013-09-17 ENCOUNTER — Encounter: Payer: Self-pay | Admitting: Critical Care Medicine

## 2013-09-17 VITALS — BP 126/76 | HR 65 | Temp 98.2°F | Ht 70.0 in | Wt 233.0 lb

## 2013-09-17 DIAGNOSIS — M545 Low back pain: Secondary | ICD-10-CM | POA: Diagnosis not present

## 2013-09-17 DIAGNOSIS — J438 Other emphysema: Secondary | ICD-10-CM

## 2013-09-17 DIAGNOSIS — M999 Biomechanical lesion, unspecified: Secondary | ICD-10-CM | POA: Diagnosis not present

## 2013-09-17 DIAGNOSIS — G4733 Obstructive sleep apnea (adult) (pediatric): Secondary | ICD-10-CM

## 2013-09-17 DIAGNOSIS — IMO0002 Reserved for concepts with insufficient information to code with codable children: Secondary | ICD-10-CM | POA: Diagnosis not present

## 2013-09-17 DIAGNOSIS — J439 Emphysema, unspecified: Secondary | ICD-10-CM

## 2013-09-17 NOTE — Patient Instructions (Signed)
Use symbicort twice daily two puff  Obtain sleep medicine evaluation with Dr Vassie Loll tomorrow Return 4 months

## 2013-09-17 NOTE — Assessment & Plan Note (Signed)
Chronic obstructive lung disease gold stage B. with associated emphysema stable at this time Plan Maintain Symbicort twice daily

## 2013-09-17 NOTE — Assessment & Plan Note (Signed)
Significant obstructive sleep apnea was diagnosed on recent sleep study Plan Referral to sleep medicine for CPAP treatment and evaluation

## 2013-09-17 NOTE — Progress Notes (Signed)
Subjective:    Patient ID: Steven Newman, male    DOB: 03/30/1941, 72 y.o.   MRN: [id number]  HPI Comments: Dyspnea and wheezing started June, 2014.  Similar issues prior year.  Wrose if gets a URI and ppts.   09/17/2013 Chief Complaint  Patient presents with  . 6 wk follow up    Breathing is a little better. Hurt back since last OV so hasn't been doing much activity.  Has a little wheezing.  No chest tightness, chest pain, or cough at this time.    Overall the patient's wheezing and dyspnea have improved on Symbicort. There is less cough. There is less wheezing or chest tightness. The patient did complete a sleep study which showed moderate obstructive sleep apnea.   Past Medical History  Diagnosis Date  . Hx of colonic polyps   . Hyperlipidemia   . Erectile dysfunction   . Hypertension   . MI (myocardial infarction)     1976, 1985  . CAD (coronary artery disease)     coronary angioplasty 1995  . Obesity   . Pneumonia   . History of bladder cancer   . Skin cancer   . Kidney stones   . Dyspnea   . Wheezing   . Type 2 diabetes mellitus      Family History  Problem Relation Age of Onset  . Heart disease Mother   . Rheumatic fever Father   . Rheumatic fever Brother   . Diabetes Sister   . Cancer Sister     cancer on eyelid     History   Social History  . Marital Status: Married    Spouse Name: N/A    Number of Children: N/A  . Years of Education: N/A   Occupational History  . Retired    Social History Main Topics  . Smoking status: Former Smoker -- 2.00 packs/day for 50 years    Types: Cigarettes    Quit date: 07/06/2013  . Smokeless tobacco: Never Used  . Alcohol Use: Yes     Comment: socially  . Drug Use: No  . Sexual Activity: Not on file   Other Topics Concern  . Not on file   Social History Narrative  . No narrative on file     Allergies  Allergen Reactions  . Ace Inhibitors     Angioedema      Outpatient Prescriptions Prior to Visit   Medication Sig Dispense Refill  . albuterol (PROVENTIL HFA;VENTOLIN HFA) 108 (90 BASE) MCG/ACT inhaler Inhale 2 puffs into the lungs every 4 (four) hours as needed for wheezing.      Marland Kitchen aspirin 81 MG tablet Take 81 mg by mouth daily.      Marland Kitchen atorvastatin (LIPITOR) 40 MG tablet Take 40 mg by mouth daily.      . budesonide-formoterol (SYMBICORT) 160-4.5 MCG/ACT inhaler Inhale 2 puffs into the lungs 2 (two) times daily.  1 Inhaler  12  . cholecalciferol (VITAMIN D) 1000 UNITS tablet Take 1,000 Units by mouth daily.      . Coenzyme Q10 (CO Q-10) 100 MG CAPS Take 1 capsule by mouth daily.      . nadolol (CORGARD) 40 MG tablet Take 40 mg by mouth daily.      . nitroGLYCERIN (NITROSTAT) 0.4 MG SL tablet Place 0.4 mg under the tongue every 5 (five) minutes as needed for chest pain.      . Omega-3 Fatty Acids (FISH OIL) 1360 MG CAPS Take 1 capsule by  mouth daily.      Marland Kitchen PRESCRIPTION MEDICATION Triflex for arthritis as needed      . Spacer/Aero-Holding Chambers (AEROCHAMBER PLUS) inhaler 1 each by Other route once. Use as instructed      . budesonide-formoterol (SYMBICORT) 160-4.5 MCG/ACT inhaler Inhale 2 puffs into the lungs 2 (two) times daily.  1 Inhaler  0   No facility-administered medications prior to visit.     Review of Systems  Constitutional: Negative for chills, diaphoresis, activity change, appetite change, fatigue and unexpected weight change.  HENT: Positive for congestion, postnasal drip and sneezing. Negative for dental problem, ear discharge, facial swelling, hearing loss, mouth sores, nosebleeds, sinus pressure, tinnitus, trouble swallowing and voice change.   Eyes: Negative for photophobia, discharge, itching and visual disturbance.  Respiratory: Positive for cough. Negative for apnea, choking, chest tightness and stridor.   Cardiovascular: Negative for palpitations.  Gastrointestinal: Negative for nausea, constipation, blood in stool and abdominal distention.  Genitourinary:  Negative for dysuria, urgency, frequency, hematuria, flank pain, decreased urine volume and difficulty urinating.  Musculoskeletal: Positive for arthralgias and myalgias. Negative for back pain, gait problem, joint swelling and neck stiffness.  Skin: Negative for color change and pallor.  Neurological: Negative for dizziness, tremors, seizures, syncope, speech difficulty, weakness, light-headedness and numbness.  Hematological: Negative for adenopathy. Bruises/bleeds easily.  Psychiatric/Behavioral: Positive for sleep disturbance. Negative for confusion and agitation. The patient is not nervous/anxious.        Objective:   Physical Exam  Filed Vitals:   09/17/13 0901  BP: 126/76  Pulse: 65  Temp: 98.2 F (36.8 C)  TempSrc: Oral  Height: 5\' 10"  (1.778 m)  Weight: 233 lb (105.688 kg)  SpO2: 95%    Gen: Pleasant, well-nourished, in no distress,  normal affect  ENT: No lesions,  mouth clear,  oropharynx clear, no postnasal drip  Neck: No JVD, no TMG, no carotid bruits  Lungs: No use of accessory muscles, no dullness to percussion, distant breath sounds  Cardiovascular: RRR, heart sounds normal, no murmur or gallops, no peripheral edema  Abdomen: soft and NT, no HSM,  BS normal  Musculoskeletal: No deformities, no cyanosis or clubbing  Neuro: alert, non focal  Skin: Warm, no lesions or rashes  No results found. 08/09/2013: Spirometry reveals FEV1 49% predicted FVC 55% predicted FEV1 to FVC ratio 68% predicted  Chest x-ray showed no active disease  Sleep study from October 2014 showed moderate obstructive sleep apnea     Assessment & Plan:   OSA (obstructive sleep apnea) Significant obstructive sleep apnea was diagnosed on recent sleep study Plan Referral to sleep medicine for CPAP treatment and evaluation  COPD with emphysema Chronic obstructive lung disease gold stage B. with associated emphysema stable at this time Plan Maintain Symbicort twice  daily    Updated Medication List Outpatient Encounter Prescriptions as of 09/17/2013  Medication Sig Dispense Refill  . albuterol (PROVENTIL HFA;VENTOLIN HFA) 108 (90 BASE) MCG/ACT inhaler Inhale 2 puffs into the lungs every 4 (four) hours as needed for wheezing.      Marland Kitchen aspirin 81 MG tablet Take 81 mg by mouth daily.      Marland Kitchen atorvastatin (LIPITOR) 40 MG tablet Take 40 mg by mouth daily.      . budesonide-formoterol (SYMBICORT) 160-4.5 MCG/ACT inhaler Inhale 2 puffs into the lungs 2 (two) times daily.  1 Inhaler  12  . cholecalciferol (VITAMIN D) 1000 UNITS tablet Take 1,000 Units by mouth daily.      . Coenzyme Q10 (  CO Q-10) 100 MG CAPS Take 1 capsule by mouth daily.      . nadolol (CORGARD) 40 MG tablet Take 40 mg by mouth daily.      . nitroGLYCERIN (NITROSTAT) 0.4 MG SL tablet Place 0.4 mg under the tongue every 5 (five) minutes as needed for chest pain.      . Omega-3 Fatty Acids (FISH OIL) 1360 MG CAPS Take 1 capsule by mouth daily.      Marland Kitchen PRESCRIPTION MEDICATION Triflex for arthritis as needed      . Spacer/Aero-Holding Chambers (AEROCHAMBER PLUS) inhaler 1 each by Other route once. Use as instructed      . [DISCONTINUED] budesonide-formoterol (SYMBICORT) 160-4.5 MCG/ACT inhaler Inhale 2 puffs into the lungs 2 (two) times daily.  1 Inhaler  0   No facility-administered encounter medications on file as of 09/17/2013.

## 2013-09-17 NOTE — Telephone Encounter (Signed)
He will be on your schedule for slee peval tomorrow  pw

## 2013-09-18 ENCOUNTER — Ambulatory Visit (INDEPENDENT_AMBULATORY_CARE_PROVIDER_SITE_OTHER): Payer: Medicare Other | Admitting: Pulmonary Disease

## 2013-09-18 ENCOUNTER — Encounter: Payer: Self-pay | Admitting: Pulmonary Disease

## 2013-09-18 VITALS — BP 132/66 | HR 62 | Temp 98.1°F | Ht 70.0 in | Wt 234.0 lb

## 2013-09-18 DIAGNOSIS — G4733 Obstructive sleep apnea (adult) (pediatric): Secondary | ICD-10-CM

## 2013-09-18 NOTE — Progress Notes (Signed)
Subjective:    Patient ID: Steven Newman, male    DOB: December 01, 1941, 72 y.o.   MRN: [id number]  HPI 72 year old, ex-smoker referred for evaluation of obstructive sleep apnea. PFTs showed FEV1 49% predicted FVC 55% predicted FEV1 to FVC ratio 68  Epworth sleepiness score is 5/24. Bedtime is 10 PM, sleep latency is 10 minutes to an hour, he sleeps prone with one pillow, has 3-5 awakenings to the left upper extremity arthritic pain and nocturia. Is out of bed by 9 AM with dryness of mouth, denies headaches. Is disabled since 1996 after a myocardial infarction. At the time of his sleep study, he weighed 228 pounds with a height of 5 feet 10 inches, BMI of  33, neck size of 18 inches.There were total of 10 obstructive apneas, 1 central apnea, 2 mixed apneas, and 5 hypopneas with an apnea-hypopnea index of 14 events per hour. 33 RERAs were noted with an RDI of 40 events per hour.Total sleep time was 78 minutes with a sleep period time of 314 minutes and a sleep efficiency of 19% only. There is no history suggestive of cataplexy, sleep paralysis or parasomnias   Past Medical History  Diagnosis Date  . Hx of colonic polyps   . Hyperlipidemia   . Erectile dysfunction   . Hypertension   . MI (myocardial infarction)     1976, 1985  . CAD (coronary artery disease)     coronary angioplasty 1995  . Obesity   . Pneumonia   . History of bladder cancer   . Skin cancer   . Kidney stones   . Dyspnea   . Wheezing   . Type 2 diabetes mellitus     Past Surgical History  Procedure Laterality Date  . Bladder cancer surgery    . Neck tumor    . 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 Ace Inhibitors     Angioedema     History   Social History  . Marital Status: Married    Spouse Name: N/A    Number of Children: N/A  . Years of Education: N/A   Occupational History  . Retired    Social History Main Topics  . Smoking status: Former Smoker -- 2.00 packs/day  for 50 years    Types: Cigarettes    Quit date: 07/06/2013  . Smokeless tobacco: Never Used  . Alcohol Use: Yes     Comment: socially  . Drug Use: No  . Sexual Activity: Not on file   Other Topics Concern  . Not on file   Social History Narrative  . No narrative on file    Family History  Problem Relation Age of Onset  . Heart disease Mother   . Rheumatic fever Father   . Rheumatic fever Brother   . Diabetes Sister   . Cancer Sister     cancer on eyelid        Review of Systems  Constitutional: Negative for fever and unexpected weight change.  HENT: Negative for congestion, dental problem, ear pain, nosebleeds, postnasal drip, rhinorrhea, sinus pressure, sneezing, sore throat and trouble swallowing.   Eyes: Negative for redness and itching.  Respiratory: Negative for cough, chest tightness, shortness of breath and wheezing.   Cardiovascular: Negative for palpitations and leg swelling.  Gastrointestinal: Negative for nausea and vomiting.  Genitourinary: Negative for dysuria.  Musculoskeletal: Positive for arthralgias. Negative for joint swelling.  Skin: Negative for rash.  Neurological:  Negative for headaches.  Hematological: Does not bruise/bleed easily.  Psychiatric/Behavioral: Negative for dysphoric mood. The patient is not nervous/anxious.        Objective:   Physical Exam  Gen. Pleasant, well-nourished, in no distress, normal affect ENT - no lesions, no post nasal drip, class 2 airway Neck: No JVD, no thyromegaly, no carotid bruits Lungs: no use of accessory muscles, no dullness to percussion, clear without rales or rhonchi  Cardiovascular: Rhythm regular, heart sounds  normal, no murmurs or gallops, no peripheral edema Abdomen: soft and non-tender, no hepatosplenomegaly, BS normal. Musculoskeletal: No deformities, no cyanosis or clubbing Neuro:  alert, non focal       Assessment & Plan:

## 2013-09-18 NOTE — Telephone Encounter (Signed)
Pt is scheduled to come in and see Dr. Vassie Loll today. Will sign off message

## 2013-09-18 NOTE — Patient Instructions (Addendum)
We discussed that you have at least mild obstructive sleep apnea  CPAP therapy would be recommended Call us if you are ready to proceed

## 2013-09-19 NOTE — Assessment & Plan Note (Signed)
The treatment options for this degree of sleep-disordered breathing include weight loss alone and/or CPAP therapy and oral appliance. . However, I would recommend a home study given the poor sleep  efficiency noted in this study, probably a first night effect.  He was cautioned against driving when sleepy. He was asked to avoid medications with sedative side effects. Alternatively we will proceed with a trial of auto CPAP with nasal pillows, and review in one month for compliance and efficacy

## 2013-09-21 DIAGNOSIS — M999 Biomechanical lesion, unspecified: Secondary | ICD-10-CM | POA: Diagnosis not present

## 2013-09-21 DIAGNOSIS — IMO0002 Reserved for concepts with insufficient information to code with codable children: Secondary | ICD-10-CM | POA: Diagnosis not present

## 2013-09-21 DIAGNOSIS — M545 Low back pain: Secondary | ICD-10-CM | POA: Diagnosis not present

## 2013-10-01 ENCOUNTER — Telehealth (HOSPITAL_COMMUNITY): Payer: Self-pay | Admitting: *Deleted

## 2013-10-08 ENCOUNTER — Ambulatory Visit: Payer: Medicare Other | Admitting: Internal Medicine

## 2013-10-12 ENCOUNTER — Ambulatory Visit (INDEPENDENT_AMBULATORY_CARE_PROVIDER_SITE_OTHER): Payer: Medicare Other | Admitting: *Deleted

## 2013-10-12 DIAGNOSIS — I10 Essential (primary) hypertension: Secondary | ICD-10-CM | POA: Diagnosis not present

## 2013-10-12 DIAGNOSIS — E785 Hyperlipidemia, unspecified: Secondary | ICD-10-CM | POA: Diagnosis not present

## 2013-10-15 LAB — NMR LIPOPROFILE WITH LIPIDS
HDL Size: 8.7 nm — ABNORMAL LOW (ref >=9.2)
LDL Particle Number: 1537 nmol/L — ABNORMAL HIGH (ref <1000)
LDL Size: 20 nm — ABNORMAL LOW (ref >20.5)
Large HDL-P: 2.5 umol/L — ABNORMAL LOW (ref >=4.8)
Large VLDL-P: 2.5 nmol/L (ref <=2.7)
Small LDL Particle Number: 1029 nmol/L — ABNORMAL HIGH (ref <=527)
Triglycerides: 115 mg/dL (ref <150)
VLDL Size: 47.6 nm — ABNORMAL HIGH (ref <=46.6)

## 2013-10-18 ENCOUNTER — Telehealth: Payer: Self-pay | Admitting: Cardiology

## 2013-10-18 DIAGNOSIS — E785 Hyperlipidemia, unspecified: Secondary | ICD-10-CM

## 2013-10-18 MED ORDER — EZETIMIBE 10 MG PO TABS: 10.0000 mg | ORAL_TABLET | Freq: Every day | ORAL | Status: DC

## 2013-10-18 NOTE — Telephone Encounter (Signed)
Pt notified. Rx for zetia sent in. Labs ordered for 01/21/14.

## 2013-10-18 NOTE — Telephone Encounter (Signed)
Message copied by Theda Sers on Thu Oct 18, 2013  2:51 PM ------      Message from: SMART, [place] G      Created: Tue Oct 16, 2013  9:06 AM       RF:  CAD, AAA, tobacco use, insulin resistance, age - LDL goal < 70, LDL-P goal < 1000      Meds:  Lipitor 80 mg qd, fish oil 1 daily      LDL improved from 120 mg/dL down to 84 mg/dL since increasing lipitor to 80 mg which is great.  We checked an NMR today given his h/o insulin resistance (glucose > 100 mg/dL consistently) and CAD.      LDL-P well above goal at 1500 (goal < 1000).  Patient has some "hidden / stealth" LDL which needs to be treated more aggressively.  Has a h/o prostate cancer, so PCSK-9 may not be an option.  Would like to add ZEtia today.      ALT normal      Plan:      1. Add Zetia 10 mg qd.      2.  Continue atorvastatin 80 mg qd.      3.  Recheck NMR LipoProfile and hepatic panel in 3 months.      Please notify patient, update meds, and set up labs. Thanks. ------

## 2013-12-24 ENCOUNTER — Encounter (HOSPITAL_COMMUNITY)
Admission: RE | Admit: 2013-12-24 | Discharge: 2013-12-24 | Disposition: A | Discharge status: Home / Self Care | Payer: Medicare Other | Source: Ambulatory Visit | Attending: Critical Care Medicine | Admitting: Critical Care Medicine

## 2013-12-24 DIAGNOSIS — Z5189 Encounter for other specified aftercare: Secondary | ICD-10-CM | POA: Insufficient documentation

## 2013-12-24 DIAGNOSIS — J438 Other emphysema: Secondary | ICD-10-CM | POA: Diagnosis not present

## 2013-12-24 NOTE — Progress Notes (Signed)
Mardene Sayer Forgione 72 y.o. male Pulmonary Rehab Orientation Note Patient arrived today in [facility] for orientation to Pulmonary Rehab. He walked from [employer] without distress.   He does not carry portable oxygen. Per pt, he never uses oxygen.  Color good, skin warm and dry. Patient is oriented to time and place. Patient's medical history and medications reviewed. Heart rate is normal, breath sounds clear to auscultation, no wheezes, rales, or rhonchi. Grip strength equal, strong. Distal pulses 2+ posterior tibial bilaterally.  Patient reports he does take medications as prescribed. Patient states he follows a Regular. The patient reports no specific efforts to gain or lose weight.  Patient's weight will be monitored closely. Demonstration and practice of PLB using pulse oximeter. Patient able to return demonstration satisfactorily. Safety and hand hygiene in the exercise area reviewed with patient. Patient voices understanding of the information reviewed. Department expectations discussed with patient and achievable goals were set. The patient shows enthusiasm about attending the program and we look forward to working with this nice gentleman. The patient is scheduled for a 6 min walk test on Tuesday, January 20 at 3:30pm  and to begin exercise on Thursday, January 22 at 10:30am.   [id number] Rosebud Poles RN

## 2013-12-25 ENCOUNTER — Encounter (HOSPITAL_COMMUNITY)
Admission: RE | Admit: 2013-12-25 | Discharge: 2013-12-25 | Disposition: A | Discharge status: Home / Self Care | Payer: Medicare Other | Source: Ambulatory Visit | Attending: Critical Care Medicine | Admitting: Critical Care Medicine

## 2013-12-25 ENCOUNTER — Encounter (HOSPITAL_COMMUNITY): Payer: Self-pay

## 2013-12-25 DIAGNOSIS — J438 Other emphysema: Secondary | ICD-10-CM | POA: Diagnosis not present

## 2013-12-25 DIAGNOSIS — Z5189 Encounter for other specified aftercare: Secondary | ICD-10-CM | POA: Diagnosis not present

## 2013-12-25 NOTE — Progress Notes (Signed)
Steven Newman completed a Six-Minute Walk Test on 12/25/13 . Steven Newman walked 1,126 feet with 0 breaks.  The patient's lowest oxygen saturation was 94% , highest heart rate was 91 bpm, and highest blood pressure was 160/80. The patient was on 0 liters. Steven Newman stated that nothing hindered his walk test.

## 2013-12-27 ENCOUNTER — Encounter (HOSPITAL_COMMUNITY)
Admission: RE | Admit: 2013-12-27 | Discharge: 2013-12-27 | Disposition: A | Discharge status: Home / Self Care | Payer: Medicare Other | Source: Ambulatory Visit | Attending: Critical Care Medicine | Admitting: Critical Care Medicine

## 2013-12-27 DIAGNOSIS — Z5189 Encounter for other specified aftercare: Secondary | ICD-10-CM | POA: Diagnosis not present

## 2013-12-27 DIAGNOSIS — J438 Other emphysema: Secondary | ICD-10-CM | POA: Diagnosis not present

## 2013-12-27 NOTE — Progress Notes (Signed)
First day of exercise for Steven Newman.  He attended Education Class on Pursed Lip and Diaphragmatic Breathing.  At check in BP was elevated in 235T systolic.  Exercise delayed until BP down to 140/70.  We did see increase in walking track one lap and on recumbent bike.  He exercised 2 min and rest 89min.  We will continue to monitor closely and contact MD if this is repeated on second visit.    Leverne Humbles RN

## 2014-01-01 ENCOUNTER — Encounter (HOSPITAL_COMMUNITY)
Admission: RE | Admit: 2014-01-01 | Discharge: 2014-01-01 | Disposition: A | Discharge status: Home / Self Care | Payer: Medicare Other | Source: Ambulatory Visit | Attending: Critical Care Medicine | Admitting: Critical Care Medicine

## 2014-01-01 DIAGNOSIS — Z5189 Encounter for other specified aftercare: Secondary | ICD-10-CM | POA: Diagnosis not present

## 2014-01-01 DIAGNOSIS — J438 Other emphysema: Secondary | ICD-10-CM | POA: Diagnosis not present

## 2014-01-03 ENCOUNTER — Encounter (HOSPITAL_COMMUNITY)
Admission: RE | Admit: 2014-01-03 | Discharge: 2014-01-03 | Disposition: A | Discharge status: Home / Self Care | Payer: Medicare Other | Source: Ambulatory Visit | Attending: Critical Care Medicine | Admitting: Critical Care Medicine

## 2014-01-03 DIAGNOSIS — J438 Other emphysema: Secondary | ICD-10-CM | POA: Diagnosis not present

## 2014-01-03 DIAGNOSIS — Z5189 Encounter for other specified aftercare: Secondary | ICD-10-CM | POA: Diagnosis not present

## 2014-01-08 ENCOUNTER — Encounter (HOSPITAL_COMMUNITY)
Admission: RE | Admit: 2014-01-08 | Discharge: 2014-01-08 | Disposition: A | Discharge status: Home / Self Care | Payer: Medicare Other | Source: Ambulatory Visit | Attending: Critical Care Medicine | Admitting: Critical Care Medicine

## 2014-01-08 DIAGNOSIS — Z5189 Encounter for other specified aftercare: Secondary | ICD-10-CM | POA: Diagnosis not present

## 2014-01-08 DIAGNOSIS — J438 Other emphysema: Secondary | ICD-10-CM | POA: Diagnosis not present

## 2014-01-10 ENCOUNTER — Encounter (HOSPITAL_COMMUNITY)
Admission: RE | Admit: 2014-01-10 | Discharge: 2014-01-10 | Disposition: A | Discharge status: Home / Self Care | Payer: Medicare Other | Source: Ambulatory Visit | Attending: Critical Care Medicine | Admitting: Critical Care Medicine

## 2014-01-15 ENCOUNTER — Encounter (HOSPITAL_COMMUNITY)
Admission: RE | Admit: 2014-01-15 | Discharge: 2014-01-15 | Disposition: A | Discharge status: Home / Self Care | Payer: Medicare Other | Source: Ambulatory Visit | Attending: Critical Care Medicine | Admitting: Critical Care Medicine

## 2014-01-15 ENCOUNTER — Encounter (HOSPITAL_COMMUNITY): Payer: Self-pay

## 2014-01-15 NOTE — Progress Notes (Signed)
I have reviewed a Home Exercise Prescription with Steven Newman . Teal is walking 3-5 days a week for 20 minutes for exercise at home.  The patient was advised to walk at least 3 days a week for 25 minutes.  Steven Newman and I discussed how to progress their exercise prescription.  The patient stated that their goals were to breathe better and sleep better.  The patient stated that they understand the exercise prescription.  We reviewed exercise guidelines, target heart rate during exercise, oxygen use, weather, home pulse oximeter, endpoints for exercise, and goals.  Patient is encouraged to come to me with any questions. I will continue to follow up with the patient to assist them with progression and safety.

## 2014-01-17 ENCOUNTER — Encounter (HOSPITAL_COMMUNITY)
Admission: RE | Admit: 2014-01-17 | Discharge: 2014-01-17 | Disposition: A | Discharge status: Home / Self Care | Payer: Medicare Other | Source: Ambulatory Visit | Attending: Critical Care Medicine | Admitting: Critical Care Medicine

## 2014-01-17 NOTE — Progress Notes (Signed)
Steven Newman 73 y.o. male Nutrition Note Spoke with pt. Pt is obese. Making healthy food choices the majority of the time.  Pt's Rate Your Plate results reviewed with pt.  Pt expressed understanding.  Pt does not avoid salty food; uses some canned food. According to pt, "we rinse the canned vegetables." Pt encouraged to try fresh/frozen vegetables or no added salt canned vegetables. Pt adds salt to food.  The role of sodium in lung disease reviewed with pt.  Pt is a diet-controlled diabetic. Pt checks fasting CBG's daily.  CBG's reportedly  106-134- mg/dL. Recommended CBG ranges reviewed with pt.Pt expressed understanding of the information reviewed. Nutrition Diagnosis   Excessive sodium intake related to over consumption of processed food as evidenced by frequent consumption of convenience food/ canned vegetables.   Food-and nutrition-related knowledge deficit related to lack of exposure to information as related to diagnosis of pulmonary disease   Obesity related to excessive energy intake as evidenced by a BMI of 34.6 Nutrition Rx/Est. Daily Nutrition Needs for: ? wt loss 1500-2000 Kcal  85-105 gm protein   1500-2000 mg or less sodium     175-250 gm CHO Nutrition Intervention   Pt's individual nutrition plan and goals reviewed with pt.   Benefits of adopting healthy eating habits discussed when pt's Rate Your Plate reviewed.   Pt to attend the Nutrition and Lung Disease class - met 01/03/14   Continual client-centered nutrition education by RD, as part of interdisciplinary care. Goal(s) 1. Pt to identify and limit food sources of sodium. 2. Identify food quantities necessary to achieve wt loss of  -2# per week to a goal wt of 211-229 lb (96.1-104.3 kg) at graduation from pulmonary rehab. Monitor and Evaluate progress toward nutrition goal with team.   Derek Mound, M.Ed, RD, LDN, CDE 01/17/2014 12:05 PM

## 2014-01-21 ENCOUNTER — Other Ambulatory Visit (INDEPENDENT_AMBULATORY_CARE_PROVIDER_SITE_OTHER): Payer: Medicare Other

## 2014-01-21 DIAGNOSIS — E785 Hyperlipidemia, unspecified: Secondary | ICD-10-CM

## 2014-01-21 LAB — HEPATIC FUNCTION PANEL
ALT: 36 U/L (ref 0–53)
AST: 37 U/L (ref 0–37)
Albumin: 3.6 g/dL (ref 3.5–5.2)
Alkaline Phosphatase: 59 U/L (ref 39–117)
Bilirubin, Direct: 0.2 mg/dL (ref 0.0–0.3)
TOTAL PROTEIN: 7.4 g/dL (ref 6.0–8.3)
Total Bilirubin: 0.9 mg/dL (ref 0.3–1.2)

## 2014-01-22 ENCOUNTER — Encounter (HOSPITAL_COMMUNITY): Payer: Medicare Other

## 2014-01-23 LAB — NMR LIPOPROFILE WITH LIPIDS
Cholesterol, Total: 153 mg/dL (ref <200)
HDL Particle Number: 31.8 umol/L (ref >=30.5)
HDL Size: 8.8 nm — ABNORMAL LOW (ref >=9.2)
HDL-C: 44 mg/dL (ref >=40)
LARGE HDL: 3.6 umol/L — AB (ref >=4.8)
LDL (calc): 84 mg/dL (ref <100)
LDL PARTICLE NUMBER: 1456 nmol/L — AB (ref <1000)
LDL SIZE: 20.2 nm — AB (ref >20.5)
LP-IR Score: 64 — ABNORMAL HIGH (ref <=45)
Large VLDL-P: 4.2 nmol/L — ABNORMAL HIGH (ref <=2.7)
Small LDL Particle Number: 929 nmol/L — ABNORMAL HIGH (ref <=527)
Triglycerides: 127 mg/dL (ref <150)
VLDL SIZE: 48.3 nm — AB (ref <=46.6)

## 2014-01-24 ENCOUNTER — Encounter (HOSPITAL_COMMUNITY)
Admission: RE | Admit: 2014-01-24 | Discharge: 2014-01-24 | Disposition: A | Discharge status: Home / Self Care | Payer: Medicare Other | Source: Ambulatory Visit | Attending: Critical Care Medicine | Admitting: Critical Care Medicine

## 2014-01-29 ENCOUNTER — Encounter (HOSPITAL_COMMUNITY): Payer: Medicare Other

## 2014-01-30 DIAGNOSIS — J449 Chronic obstructive pulmonary disease, unspecified: Secondary | ICD-10-CM | POA: Diagnosis not present

## 2014-01-30 DIAGNOSIS — G4733 Obstructive sleep apnea (adult) (pediatric): Secondary | ICD-10-CM | POA: Diagnosis not present

## 2014-01-31 ENCOUNTER — Encounter (HOSPITAL_COMMUNITY): Payer: Medicare Other

## 2014-02-05 ENCOUNTER — Encounter (HOSPITAL_COMMUNITY)
Admission: RE | Admit: 2014-02-05 | Discharge: 2014-02-05 | Disposition: A | Discharge status: Home / Self Care | Payer: Medicare Other | Source: Ambulatory Visit | Attending: Critical Care Medicine | Admitting: Critical Care Medicine

## 2014-02-05 DIAGNOSIS — Z5189 Encounter for other specified aftercare: Secondary | ICD-10-CM | POA: Diagnosis not present

## 2014-02-05 DIAGNOSIS — J438 Other emphysema: Secondary | ICD-10-CM | POA: Diagnosis not present

## 2014-02-07 ENCOUNTER — Encounter (HOSPITAL_COMMUNITY)
Admission: RE | Admit: 2014-02-07 | Discharge: 2014-02-07 | Disposition: A | Discharge status: Home / Self Care | Payer: Medicare Other | Source: Ambulatory Visit | Attending: Critical Care Medicine | Admitting: Critical Care Medicine

## 2014-02-09 ENCOUNTER — Encounter: Payer: Self-pay | Admitting: Cardiology

## 2014-02-12 ENCOUNTER — Encounter (HOSPITAL_COMMUNITY)
Admission: RE | Admit: 2014-02-12 | Discharge: 2014-02-12 | Disposition: A | Discharge status: Home / Self Care | Payer: Medicare Other | Source: Ambulatory Visit | Attending: Critical Care Medicine | Admitting: Critical Care Medicine

## 2014-02-14 ENCOUNTER — Encounter (HOSPITAL_COMMUNITY)
Admission: RE | Admit: 2014-02-14 | Discharge: 2014-02-14 | Disposition: A | Discharge status: Home / Self Care | Payer: Medicare Other | Source: Ambulatory Visit | Attending: Critical Care Medicine | Admitting: Critical Care Medicine

## 2014-02-14 ENCOUNTER — Telehealth: Payer: Self-pay | Admitting: Cardiology

## 2014-02-14 DIAGNOSIS — J441 Chronic obstructive pulmonary disease with (acute) exacerbation: Secondary | ICD-10-CM | POA: Diagnosis not present

## 2014-02-14 DIAGNOSIS — E785 Hyperlipidemia, unspecified: Secondary | ICD-10-CM

## 2014-02-14 MED ORDER — COLESEVELAM HCL 3.75 G PO PACK: PACK | ORAL | Status: DC

## 2014-02-14 NOTE — Telephone Encounter (Signed)
Message copied by Alcario Drought on Thu Feb 14, 2014  1:48 PM ------      Message from: SMART, Maralyn Sago      Created: Sun Feb 10, 2014 12:31 PM       Given length of time of MI and h/o bladder cancer, patient would not likely be candidate for PCSK-9 study unfortunately.  Will continue lipitor 80 mg qd, but given LDL-P > 1400 nmol/L, would like patient to add Welchol once daily as well.  Welchol has extemely low side effect profile.  The only thing he may experience would be constipation.      Plan:      1.  Add Welchol 3.75 g powder for suspension.  Mix one packet in 4-8 ounces of water or juice, and drink once daily.      2.  Continue atorvastatin 80 mg once daily.      3.  Recheck NMR LipoProfile and hepatic panel 3 months later.      Please notify patient, update meds, and set up labs. Thanks. ------

## 2014-02-14 NOTE — Telephone Encounter (Signed)
Pts wife notified. Labs ordered and rx sent in.

## 2014-02-19 ENCOUNTER — Telehealth (HOSPITAL_COMMUNITY): Payer: Self-pay | Admitting: Gastroenterology

## 2014-02-19 ENCOUNTER — Encounter (HOSPITAL_COMMUNITY): Payer: Medicare Other

## 2014-02-21 ENCOUNTER — Encounter (HOSPITAL_COMMUNITY)
Admission: RE | Admit: 2014-02-21 | Discharge: 2014-02-21 | Disposition: A | Discharge status: Home / Self Care | Payer: Medicare Other | Source: Ambulatory Visit | Attending: Critical Care Medicine | Admitting: Critical Care Medicine

## 2014-02-21 ENCOUNTER — Encounter (HOSPITAL_COMMUNITY): Payer: Self-pay

## 2014-02-21 NOTE — Progress Notes (Signed)
Today, Steven Newman exercised at [employer] [facility].  The patient exercised for more than 31 minutes on different pieces of equipment. Oxygen saturation, heart rate, blood pressure, rate of perceived exertion, and shortness of breath were all monitored before, during, and after exercise. Steven Newman presented with no problems at today's exercise session.    Dr. Brand Males, Medical Director Dr. Aileen Fass is immediately available during today's Pulmonary Rehab session for [place].

## 2014-02-25 DIAGNOSIS — L509 Urticaria, unspecified: Secondary | ICD-10-CM | POA: Diagnosis not present

## 2014-02-26 ENCOUNTER — Ambulatory Visit (INDEPENDENT_AMBULATORY_CARE_PROVIDER_SITE_OTHER): Payer: Medicare Other | Admitting: Interventional Cardiology

## 2014-02-26 ENCOUNTER — Encounter (HOSPITAL_COMMUNITY)
Admission: RE | Admit: 2014-02-26 | Discharge: 2014-02-26 | Disposition: A | Discharge status: Home / Self Care | Payer: Medicare Other | Source: Ambulatory Visit | Attending: Critical Care Medicine | Admitting: Critical Care Medicine

## 2014-02-26 ENCOUNTER — Encounter: Payer: Self-pay | Admitting: Interventional Cardiology

## 2014-02-26 ENCOUNTER — Encounter (HOSPITAL_COMMUNITY): Payer: Self-pay

## 2014-02-26 VITALS — BP 140/82 | HR 79 | Ht 70.0 in | Wt 229.8 lb

## 2014-02-26 DIAGNOSIS — E785 Hyperlipidemia, unspecified: Secondary | ICD-10-CM

## 2014-02-26 DIAGNOSIS — R0602 Shortness of breath: Secondary | ICD-10-CM

## 2014-02-26 DIAGNOSIS — I10 Essential (primary) hypertension: Secondary | ICD-10-CM | POA: Diagnosis not present

## 2014-02-26 DIAGNOSIS — I2510 Atherosclerotic heart disease of native coronary artery without angina pectoris: Secondary | ICD-10-CM

## 2014-02-26 NOTE — Progress Notes (Signed)
Patient ID: Steven Newman, male   DOB: 1941/10/15, 73 y.o.   MRN: [id number]    [place], [place], [place]  [address] Phone: [phone] Fax:  [phone]  Date:  02/26/2014   ID:  Steven Newman, DOB 18-Feb-1941, MRN [id number]  PCP:  Garlan Fair, MD      History of Present Illness: Steven Newman is a 73 y.o. male with CAD. His SOB persists. He has had a cold and BP has been higher. Just finished antibiotic. He had a reaction to penicillin, augmentin-rash. Now on cough medicine. He has not had any sx like he had before his MI. At that time, he felt indigestion with arm pain. He has had some back pain but this is better. Not checking BP at home regularly. He had angioedema with ACE-I in the past. Lowest BP at home was 94/70, about a few months ago. Typical reading was usually 124/76 in the past. Now up to 301S systolic. He has decreased cigarette use significantly. CAD/ASCVD:  c/o Leg edema more at the end of the day.  Denies : Dizziness.  Nitroglycerin.  Orthopnea.  Palpitations.  Syncope.   DOing pulmonary rehab.  BP controlled there. Blood sugar is good after exercise.  Had severe rash with Augmentin/PCN.  Wt Readings from Last 3 Encounters:  02/26/14 229 lb 12.8 oz (104.237 kg)  09/18/13 234 lb (106.142 kg)  09/17/13 233 lb (105.688 kg)     Past Medical History  Diagnosis Date  . Hx of colonic polyps   . Hyperlipidemia   . Erectile dysfunction   . Hypertension   . MI (myocardial infarction)     1976, 1985  . CAD (coronary artery disease)     coronary angioplasty 1995  . Obesity   . Pneumonia   . History of bladder cancer   . Skin cancer   . Kidney stones   . Dyspnea   . Wheezing   . Type 2 diabetes mellitus     Current Outpatient Prescriptions  Medication Sig Dispense Refill  . albuterol (PROVENTIL HFA;VENTOLIN HFA) 108 (90 BASE) MCG/ACT inhaler Inhale 2 puffs into the lungs every 4 (four) hours as needed for wheezing.      Marland Kitchen aspirin 81 MG  tablet Take 81 mg by mouth daily.      Marland Kitchen atorvastatin (LIPITOR) 40 MG tablet Take 40 mg by mouth daily.      . budesonide-formoterol (SYMBICORT) 160-4.5 MCG/ACT inhaler Inhale 2 puffs into the lungs 2 (two) times daily.  1 Inhaler  12  . cholecalciferol (VITAMIN D) 1000 UNITS tablet Take 1,000 Units by mouth daily.      . Coenzyme Q10 (CO Q-10) 100 MG CAPS Take 1 capsule by mouth daily.      . nadolol (CORGARD) 40 MG tablet Take 40 mg by mouth daily.      . nitroGLYCERIN (NITROSTAT) 0.4 MG SL tablet Place 0.4 mg under the tongue every 5 (five) minutes as needed for chest pain.      . Omega-3 Fatty Acids (FISH OIL) 1360 MG CAPS Take 1 capsule by mouth daily.      Marland Kitchen PRESCRIPTION MEDICATION Triflex for arthritis as needed      . Spacer/Aero-Holding Chambers (AEROCHAMBER PLUS) inhaler 1 each by Other route once. Use as instructed      . Colesevelam HCl 3.75 G PACK Mix one packet in 4-8 ounces of water or juice, and drink once daily.  30 each  6  No current facility-administered medications for this visit.    Allergies:    Allergies  Allergen Reactions  . Ace Inhibitors     Angioedema   . Penicillins     Social History:  The patient  reports that he quit smoking about 7 months ago. His smoking use included Cigarettes. He has a 100 pack-year smoking history. He has never used smokeless tobacco. He reports that he drinks alcohol. He reports that he does not use illicit drugs.   Family History:  The patient's family history includes Cancer in his sister; Diabetes in his sister; Heart disease in his mother; Rheumatic fever in his brother and father.   ROS:  Please see the history of present illness.  No nausea, vomiting.  No fevers, chills.  No focal weakness.  No dysuria.    All other systems reviewed and negative.   PHYSICAL EXAM: VS:  BP 140/82  Pulse 79  Ht 5\' 10"  (1.778 m)  Wt 229 lb 12.8 oz (104.237 kg)  BMI 32.97 kg/m2  SpO2 95% Well nourished, well developed, in no acute  distress HEENT: normal Neck: no JVD, no carotid bruits Cardiac:  normal S1, S2; RRR;  Lungs:  clear to auscultation bilaterally, no wheezing, rhonchi or rales Abd: soft, nontender, no hepatomegaly Ext: no edema Skin: warm and dry Neuro:   no focal abnormalities noted  EKG:  6/14; ECG revealed SB with inferior MI.    ASSESSMENT AND PLAN:  Shortness of breath dyspnea  Notes: Likely multifactorial. He wants to get back to exercise. Limited most recently due to back pain and then by knee pain. He does have a treadmill at home.  He is not taking Advair. Discussed PFTs. He wants to try exercise first. Getting better with pulmonary rehab .  Follows with Dr. Joya Gaskins.   2. CAD in native artery  Continue Lipitor Tablet, 80 MG, 1 tablet, Orally, Once a day Notes: No angina. No sx like what he had before MI. PTCA in 1995. LAst LDL-p >1400. WOuld like to see LDL around 70 given prior MI.    3. Smoker  Notes: He had cut back significnatly. A few months ago, he stopped completely.    4. AAA  Notes: Small infrarenal area of ectasia, up to 28 mm.        5. Hyperlipidemia: COntinue lipitor 80 mg daily. Welchol added.  He will start this tomorrow.  LDL-p > 1400. Preventive Medicine  Adult topics discussed:  Diet: healthy diet.  Exercise: at least 30 minutes of aerobic exercise, 5 days a week.      Signed, Mina Marble, MD, [facility] 02/26/2014 4:31 PM

## 2014-02-26 NOTE — Progress Notes (Signed)
Today, Vasco exercised at [facility]. Service time was from 10:30 to 12:00.  The patient exercised for more than 31 minutes on different pieces of equipment. Oxygen saturation, heart rate, blood pressure, rate of perceived exertion, and shortness of breath were all monitored before, during, and after exercise. Deston presented with no problems at today's exercise session.   Pre-exercise vitals:   Weight: 103.9   Liters of O2: 0   SpO2: 95   HR: 65   BP: 132/70   CGB: 133  Exercise vitals:   Highest heartrate:  79   Lowest oxygen saturation: 94   Highest blood pressure: 136/70   Liters of O2: 0  Post-exercise vitals:   SpO2: 95   HR: 71   BP: 124/62   CGB: 87

## 2014-02-26 NOTE — Patient Instructions (Signed)
Your physician recommends that you continue on your current medications as directed. Please refer to the Current Medication list given to you today.  Your physician wants you to follow-up in: 9 months with Dr. Irish Lack. You will receive a reminder letter in the mail two months in advance. If you don't receive a letter, please call our office to schedule the follow-up appointment.

## 2014-02-28 ENCOUNTER — Encounter (HOSPITAL_COMMUNITY)
Admission: RE | Admit: 2014-02-28 | Discharge: 2014-02-28 | Disposition: A | Discharge status: Home / Self Care | Payer: Medicare Other | Source: Ambulatory Visit | Attending: Critical Care Medicine | Admitting: Critical Care Medicine

## 2014-02-28 ENCOUNTER — Encounter (HOSPITAL_COMMUNITY): Payer: Self-pay

## 2014-02-28 NOTE — Progress Notes (Signed)
Today, Uziel exercised at [employer]. Service time was from 10:30 to 12:30.  The patient exercised for more than 31 minutes on different pieces of equipment. Dawid also performed strength training for 10 minutes.  Oxygen saturation, heart rate, blood pressure, rate of perceived exertion, and shortness of breath were all monitored before, during, and after exercise. Antwaine presented with no problems at today's exercise session.   Pre-exercise vitals:   Weight kg: 103.2   Liters of O2: 0   SpO2: 97   HR: 59   BP: 124/62   CGB: 142  Exercise vitals:   Highest heartrate:  86   Lowest oxygen saturation: 95   Highest blood pressure: 136/70   Liters of O2: 0  Post-exercise vitals:   SpO2: 93   HR: 67   BP: 122/48   CGB: 94  Dr. Brand Males, Medical Director Dr. Ree Kida is immediately available during today's Pulmonary Rehab session for [place].

## 2014-03-05 ENCOUNTER — Encounter (HOSPITAL_COMMUNITY): Payer: Self-pay

## 2014-03-05 ENCOUNTER — Encounter (HOSPITAL_COMMUNITY)
Admission: RE | Admit: 2014-03-05 | Discharge: 2014-03-05 | Disposition: A | Discharge status: Home / Self Care | Payer: Medicare Other | Source: Ambulatory Visit | Attending: Critical Care Medicine | Admitting: Critical Care Medicine

## 2014-03-05 NOTE — Progress Notes (Signed)
Today, Steven Newman exercised at [employer]. Steven Newman. Service time was from 10:30 to 12:30.  The patient exercised for more than 31 minutes performing aerobic, strengthening, and stretching exercises. Oxygen saturation, heart rate, blood pressure, rate of perceived exertion, and shortness of breath were all monitored before, during, and after exercise. Steven Newman presented with no problems at today's exercise session.   Pre-exercise vitals:   Weight kg: 103.9   Liters of O2: 0   SpO2: 95   HR: 63   BP: 130/64   CBG: 135  Exercise vitals:   Highest heartrate:  75   Lowest oxygen saturation: 94   Highest blood pressure: 140/70   Liters of 02: 0  Post-exercise vitals:   SpO2: 96   HR: 66   BP: 118/64   Liters of O2: 0   CBG: 86   Dr. Brand Males, Medical Director Dr. Ree Kida is immediately available during today's Pulmonary Newman session for Steven Newman on 03/05/14 at 10:30am class time.

## 2014-03-07 ENCOUNTER — Encounter (HOSPITAL_COMMUNITY): Payer: Self-pay

## 2014-03-07 ENCOUNTER — Encounter (HOSPITAL_COMMUNITY)
Admission: RE | Admit: 2014-03-07 | Discharge: 2014-03-07 | Disposition: A | Discharge status: Home / Self Care | Payer: Medicare Other | Source: Ambulatory Visit | Attending: Critical Care Medicine | Admitting: Critical Care Medicine

## 2014-03-07 DIAGNOSIS — Z5189 Encounter for other specified aftercare: Secondary | ICD-10-CM | POA: Diagnosis not present

## 2014-03-07 DIAGNOSIS — J438 Other emphysema: Secondary | ICD-10-CM | POA: Diagnosis not present

## 2014-03-07 NOTE — Progress Notes (Signed)
Today, [name] exercised at [facility] [facility]. Service time was from 10:30 to 12:30.  The patient exercised for more than 31 minutes performing aerobic, strengthening, and stretching exercises. Oxygen saturation, heart rate, blood pressure, rate of perceived exertion, and shortness of breath were all monitored before, during, and after exercise. Steven Newman presented with no problems at today's exercise session. The patient attended Warning Signs, Symptoms, and the Environment education session.  There was no workload change during today's exercise session.  Pre-exercise vitals:   Weight kg: 103.6   Liters of O2: 0   SpO2: 94   HR: 63   BP: 118/60   CBG: 124  Exercise vitals:   Highest heartrate:  68   Lowest oxygen saturation: 95   Highest blood pressure: 142/86   Liters of 02: 0  Post-exercise vitals:   SpO2: 96   HR: 65   BP: 122/62   Liters of O2: 0   CBG: 76  Dr. Brand Males, Medical Director Dr. Aileen Fass is immediately available during today's Pulmonary Rehab session for [place] on 03/07/14 at 10:30am class time.

## 2014-03-12 ENCOUNTER — Encounter (HOSPITAL_COMMUNITY): Payer: Self-pay

## 2014-03-12 ENCOUNTER — Encounter (HOSPITAL_COMMUNITY)
Admission: RE | Admit: 2014-03-12 | Discharge: 2014-03-12 | Disposition: A | Discharge status: Home / Self Care | Payer: Medicare Other | Source: Ambulatory Visit | Attending: Critical Care Medicine | Admitting: Critical Care Medicine

## 2014-03-12 NOTE — Progress Notes (Signed)
Today, [place] exercised at [facility] [facility]. Service time was from 10:30 to 12:30.  The patient exercised for more than 31 minutes performing aerobic, strengthening, and stretching exercises. Oxygen saturation, heart rate, blood pressure, rate of perceived exertion, and shortness of breath were all monitored before, during, and after exercise. [place] presented with no problems at today's exercise session.   There was no workload change during today's exercise session.  Pre-exercise vitals:   Weight kg: 103.5   Liters of O2: 0   SpO2: 95   HR: 64   BP: 112/72   CBG: 132  Exercise vitals:   Highest heartrate:  106   Lowest oxygen saturation: 95   Highest blood pressure: 124/66   Liters of 02: 0  Post-exercise vitals:   SpO2: 96   HR: 68   BP: 120/78   Liters of O2: 0   CBG: 87 given snack  Dr. Brand Males, Medical Director Dr. Aileen Fass is immediately available during today's Pulmonary Rehab session for [place] on 03/12/14 at 10:30 class time.

## 2014-03-14 ENCOUNTER — Encounter (HOSPITAL_COMMUNITY): Payer: Self-pay

## 2014-03-14 ENCOUNTER — Encounter (HOSPITAL_COMMUNITY)
Admission: RE | Admit: 2014-03-14 | Discharge: 2014-03-14 | Disposition: A | Discharge status: Home / Self Care | Payer: Medicare Other | Source: Ambulatory Visit | Attending: Critical Care Medicine | Admitting: Critical Care Medicine

## 2014-03-14 NOTE — Progress Notes (Signed)
Today, Steven Newman exercised at [facility] [facility]. Service time was from 10:30 to 12:30.  The patient exercised for more than 31 minutes performing aerobic, strengthening, and stretching exercises. Oxygen saturation, heart rate, blood pressure, rate of perceived exertion, and shortness of breath were all monitored before, during, and after exercise. Steven Newman presented with no problems at today's exercise session. The patient attended the education class "Advanced Directives" with Jeanella Craze today.  There was an increase workload change during today's exercise session.  Pre-exercise vitals:   Weight kg: 102.7   Liters of O2: 0   SpO2: 96   HR: 59   BP: 118/60   CBG: 140  Exercise vitals:   Highest heartrate:  67   Lowest oxygen saturation: 93   Highest blood pressure: 118/72   Liters of 02: 0  Post-exercise vitals:   SpO2: 95   HR: 63   BP: 102/72   Liters of O2: 0   CBG: 79  Dr. Brand Males, Medical Director Dr. Dyann Kief is immediately available during today's Pulmonary Rehab session for [place] on 03/14/14 at 1030 class time.

## 2014-03-19 ENCOUNTER — Encounter (HOSPITAL_COMMUNITY)
Admission: RE | Admit: 2014-03-19 | Discharge: 2014-03-19 | Disposition: A | Discharge status: Home / Self Care | Payer: Medicare Other | Source: Ambulatory Visit | Attending: Critical Care Medicine | Admitting: Critical Care Medicine

## 2014-03-19 ENCOUNTER — Encounter (HOSPITAL_COMMUNITY): Payer: Self-pay

## 2014-03-19 NOTE — Progress Notes (Signed)
Today, Steven Newman exercised at [employer]. [facility]. Service time was from 10:30 to 12:30.  The patient exercised for more than 31 minutes performing aerobic, strengthening, and stretching exercises. Oxygen saturation, heart rate, blood pressure, rate of perceived exertion, and shortness of breath were all monitored before, during, and after exercise. Steven Newman presented with no problems at today's exercise session.   There was no workload change during today's exercise session.  Pre-exercise vitals:   Weight kg: 103.8   Liters of O2: ra   SpO2: 96   HR: 57   BP: 122/66   CBG: 135  Exercise vitals:   Highest heartrate:  75   Lowest oxygen saturation: 96   Highest blood pressure: 132/70   Liters of 02: ra  Post-exercise vitals:   SpO2: 93   HR: 65   BP: 108/72   Liters of O2: ra   CBG: 81  Dr. Brand Males, Medical Director Dr. Dyann Kief is immediately available during today's Pulmonary Rehab session for [place] on 03/19/14 at 10:30 class time.

## 2014-03-21 ENCOUNTER — Encounter (HOSPITAL_COMMUNITY): Payer: Self-pay

## 2014-03-21 ENCOUNTER — Encounter (HOSPITAL_COMMUNITY)
Admission: RE | Admit: 2014-03-21 | Discharge: 2014-03-21 | Disposition: A | Discharge status: Home / Self Care | Payer: Medicare Other | Source: Ambulatory Visit | Attending: Critical Care Medicine | Admitting: Critical Care Medicine

## 2014-03-21 NOTE — Progress Notes (Signed)
Today, Steven Newman exercised at [employer]. Steven Newman. Service time was from 10:30 to 12:30.  The patient exercised for more than 31 minutes performing aerobic, strengthening, and stretching exercises. Oxygen saturation, heart rate, blood pressure, rate of perceived exertion, and shortness of breath were all monitored before, during, and after exercise. Steven Newman presented with no problems at today's exercise session.   There was no workload change during today's exercise session.  Pre-exercise vitals:   Weight kg: 103.0   Liters of O2: ra   SpO2: 97   HR: 61   BP: 118/70   CBG: 133  Exercise vitals:   Highest heartrate:  70   Lowest oxygen saturation: 96   Highest blood pressure: 116/62   Liters of 02: ra  Post-exercise vitals:   SpO2: 96   HR: 68   BP: 115/74   Liters of O2: ra   CBG: 91  Dr. Brand Males, Medical Director Dr. Aileen Fass is immediately available during today's Pulmonary Newman session for [place] on 03/21/14 at 10:30am class time.

## 2014-03-26 ENCOUNTER — Encounter (HOSPITAL_COMMUNITY)
Admission: RE | Admit: 2014-03-26 | Discharge: 2014-03-26 | Disposition: A | Discharge status: Home / Self Care | Payer: Medicare Other | Source: Ambulatory Visit | Attending: Critical Care Medicine | Admitting: Critical Care Medicine

## 2014-03-26 NOTE — Progress Notes (Signed)
Today, [name] exercised at [employer]. [facility]. Service time was from 1030 to 1230.  The patient exercised for more than 31 minutes performing aerobic, strengthening, and stretching exercises. Oxygen saturation, heart rate, blood pressure, rate of perceived exertion, and shortness of breath were all monitored before, during, and after exercise. Steven Newman presented with no problems at today's exercise session, he did complain of left knee pain due to yard work at home over the last few days..   There was no workload change during today's exercise session.  Pre-exercise vitals:   Weight kg: 103.8   Liters of O2: room air   SpO2: 95   HR: 59   BP: 112/72   CBG: 135  Exercise vitals:   Highest heartrate:  79   Lowest oxygen saturation: 94   Highest blood pressure: 108/70   Liters of 02: Room Air  Post-exercise vitals:   SpO2: 95   HR: 64   BP: 98/60   Liters of O2: Room Air   CBG: 91 Dr. Brand Males, Medical Director Dr. Aileen Fass is immediately available during today's Pulmonary Rehab session for [name] on 03/26/2014 at 1030 class time.

## 2014-03-28 ENCOUNTER — Encounter (HOSPITAL_COMMUNITY)
Admission: RE | Admit: 2014-03-28 | Discharge: 2014-03-28 | Disposition: A | Discharge status: Home / Self Care | Payer: Medicare Other | Source: Ambulatory Visit | Attending: Critical Care Medicine | Admitting: Critical Care Medicine

## 2014-03-28 NOTE — Progress Notes (Signed)
Today, Lemon exercised at [employer]. [facility]. Service time was from 10:30 to 12:30.  The patient exercised for more than 31 minutes performing aerobic, strengthening, and stretching exercises. Oxygen saturation, heart rate, blood pressure, rate of perceived exertion, and shortness of breath were all monitored before, during, and after exercise. Steven Newman presented with no problems at today's exercise session.   There was no workload change during today's exercise session.  Pre-exercise vitals:   Weight kg: 104.1   Liters of O2: ra   SpO2: 94   HR: 58   BP: 114/70   CBG: 140  Exercise vitals:   Highest heartrate:  70   Lowest oxygen saturation: 95   Highest blood pressure: 138/68   Liters of 02: ra  Post-exercise vitals:   SpO2: 94   HR: 66   BP: 114/64   Liters of O2: ra   CBG: 91  Dr. Brand Males, Medical Director Dr. Sheran Fava is immediately available during today's Pulmonary Rehab session for [place] on 03/28/14 at 10:30 class time.

## 2014-04-02 ENCOUNTER — Encounter (HOSPITAL_COMMUNITY)
Admission: RE | Admit: 2014-04-02 | Discharge: 2014-04-02 | Disposition: A | Discharge status: Home / Self Care | Payer: Medicare Other | Source: Ambulatory Visit | Attending: Critical Care Medicine | Admitting: Critical Care Medicine

## 2014-04-02 NOTE — Progress Notes (Signed)
Today, Helio exercised at [employer]. [facility]. Service time was from 10:30 to 12:15.  The patient exercised for more than 31 minutes performing aerobic, strengthening, and stretching exercises. Oxygen saturation, heart rate, blood pressure, rate of perceived exertion, and shortness of breath were all monitored before, during, and after exercise. Gal presented with no problems at today's exercise session.   There was no workload change during today's exercise session.  Pre-exercise vitals:   Weight kg: 104.3   Liters of O2: ra   SpO2: 95   HR: 62   BP: 124/62   CBG: 141  Exercise vitals:   Highest heartrate:  72   Lowest oxygen saturation: 95   Highest blood pressure: 126/64   Liters of 02: ra  Post-exercise vitals:   SpO2: 94   HR: 64   BP: 114/72   Liters of O2: ra   CBG: 92  Dr. Brand Males, Medical Director Dr. Sheran Fava is immediately available during today's Pulmonary Rehab session for [place] on 04/02/14 at 10:30am class time.

## 2014-04-04 ENCOUNTER — Encounter (HOSPITAL_COMMUNITY)
Admission: RE | Admit: 2014-04-04 | Discharge: 2014-04-04 | Disposition: A | Discharge status: Home / Self Care | Payer: Medicare Other | Source: Ambulatory Visit | Attending: Critical Care Medicine | Admitting: Critical Care Medicine

## 2014-04-04 NOTE — Progress Notes (Signed)
Today, Steven Newman exercised at [employer]. [facility]. Service time was from 10:30 to 12:15.  The patient exercised for more than 31 minutes performing aerobic, strengthening, and stretching exercises. Oxygen saturation, heart rate, blood pressure, rate of perceived exertion, and shortness of breath were all monitored before, during, and after exercise. [name] presented with no problems at today's exercise session. The patient attended the education class "Exercise for the Pulmonary Patient" today.   There was an increase in workload change during today's exercise session.  Pre-exercise vitals:   Weight kg: 103.6   Liters of O2: ra   SpO2: 93   HR: 68   BP: 126/60   CBG: 141  Exercise vitals:   Highest heartrate:  84   Lowest oxygen saturation: 95   Highest blood pressure: 148/72   Liters of 02: ra  Post-exercise vitals:   SpO2: 95   HR: 76   BP: 126/64   Liters of O2: ra   CBG: 97  Dr. Brand Males, Medical Director Dr. Aileen Fass is immediately available during today's Pulmonary Rehab session for Steven Newman on 04/04/14 at 10:30am class time.

## 2014-04-09 ENCOUNTER — Encounter (HOSPITAL_COMMUNITY)
Admission: RE | Admit: 2014-04-09 | Discharge: 2014-04-09 | Disposition: A | Discharge status: Home / Self Care | Payer: Medicare Other | Source: Ambulatory Visit | Attending: Critical Care Medicine | Admitting: Critical Care Medicine

## 2014-04-09 DIAGNOSIS — J438 Other emphysema: Secondary | ICD-10-CM | POA: Insufficient documentation

## 2014-04-09 DIAGNOSIS — Z5189 Encounter for other specified aftercare: Secondary | ICD-10-CM | POA: Insufficient documentation

## 2014-04-09 NOTE — Progress Notes (Signed)
Steven Newman completed a Six-Minute Walk Test on 04/09/14 . Steven Newman walked 1,479 feet with 0 breaks.  The patient's lowest oxygen saturation was 94% , highest heart rate was 83 , and highest blood pressure was 180/66. The patient was on ra liters of oxygen with a nasal cannula. Steven Newman stated that knee pain hindered their walk test.  This is an additional 353 feet from his post walk test.

## 2014-05-20 ENCOUNTER — Other Ambulatory Visit: Payer: Medicare Other

## 2014-05-20 ENCOUNTER — Encounter: Payer: Self-pay | Admitting: Interventional Cardiology

## 2014-05-20 ENCOUNTER — Other Ambulatory Visit: Payer: Self-pay | Admitting: Interventional Cardiology

## 2014-05-20 DIAGNOSIS — E785 Hyperlipidemia, unspecified: Secondary | ICD-10-CM | POA: Diagnosis not present

## 2014-05-27 ENCOUNTER — Telehealth: Payer: Self-pay | Admitting: Interventional Cardiology

## 2014-05-27 DIAGNOSIS — E785 Hyperlipidemia, unspecified: Secondary | ICD-10-CM

## 2014-05-27 NOTE — Telephone Encounter (Signed)
05/2014 NMR lab:   LDL-P 1087, LDL 94, TC 165, HDL 47, TG 119, LFTs normal.  LDL-P goal < 1000, LDL goal at least < 100, and prefer < 70 Meds:  Lipitor 80 mg qd, and suppose to also be taking Welchol 3.75 g daily. Intolerant:  Zetia (dizziness) Given length of time of MI and h/o bladder cancer, patient would not likely be candidate for PCSK-9 study unfortunately. LDL-P improved from 1400 to < 1100.  LDL and non-HDL still above goal, however given LDL-P trending down, will not make any other changes. Plan: 1.  Confirm if patient taking Welchol or not and update med list accordingly. 2.  Continue regimen he is currently taking. 3.  Recheck NMR LipoProfile and hepatic panel in 6 months. Please notify patient, update meds (Welchol - taking yes or no?), and set up labs. Thanks.

## 2014-05-27 NOTE — Telephone Encounter (Signed)
Please see scanned labs from [place] re : lipids

## 2014-05-31 NOTE — Telephone Encounter (Signed)
Spoke with pt and he is taking atorvastatin 40 mg daily and fish oil 1400 mg daily. He has been off welchol for a while now. Meds updated and labs ordered.

## 2014-05-31 NOTE — Addendum Note (Signed)
Addended byUlla Potash H on: 05/31/2014 02:37 PM   Modules accepted: Orders, Medications

## 2014-06-04 DIAGNOSIS — H43399 Other vitreous opacities, unspecified eye: Secondary | ICD-10-CM | POA: Diagnosis not present

## 2014-06-06 LAB — NMR, LIPOPROFILE
CHOLESTEROL: 165 mg/dL (ref 100–199)
HDL Cholesterol by NMR: 47 mg/dL (ref >39)
HDL Particle Number: 30 umol/L — ABNORMAL LOW (ref >=30.5)
LDL PARTICLE NUMBER: 1087 nmol/L — AB (ref <1000)
LDL Size: 20.3 nm (ref >20.5)
LDLC SERPL CALC-MCNC: 94 mg/dL (ref 0–99)
LP-IR Score: 41 (ref <=45)
Small LDL Particle Number: 630 nmol/L — ABNORMAL HIGH (ref <=527)
TRIGLYCERIDES BY NMR: 119 mg/dL (ref 0–149)

## 2014-06-06 LAB — HEPATIC FUNCTION PANEL
ALT: 24 IU/L (ref 0–44)
AST: 23 IU/L (ref 0–40)
Albumin: 4.2 g/dL (ref 3.5–4.8)
Alkaline Phosphatase: 90 IU/L (ref 39–117)
Bilirubin, Direct: 0.21 mg/dL (ref 0.00–0.40)
Total Bilirubin: 0.8 mg/dL (ref 0.0–1.2)
Total Protein: 7.1 g/dL (ref 6.0–8.5)

## 2014-06-24 ENCOUNTER — Other Ambulatory Visit: Payer: Self-pay

## 2014-06-24 MED ORDER — ATORVASTATIN CALCIUM 40 MG PO TABS: 40.0000 mg | ORAL_TABLET | Freq: Every day | ORAL | Status: DC

## 2014-07-08 DIAGNOSIS — Z23 Encounter for immunization: Secondary | ICD-10-CM | POA: Diagnosis not present

## 2014-07-08 DIAGNOSIS — E119 Type 2 diabetes mellitus without complications: Secondary | ICD-10-CM | POA: Diagnosis not present

## 2014-07-08 DIAGNOSIS — Z0000 Encounter for general adult medical examination without abnormal findings: Secondary | ICD-10-CM | POA: Diagnosis not present

## 2014-07-08 DIAGNOSIS — J449 Chronic obstructive pulmonary disease, unspecified: Secondary | ICD-10-CM | POA: Diagnosis not present

## 2014-07-08 DIAGNOSIS — R809 Proteinuria, unspecified: Secondary | ICD-10-CM | POA: Diagnosis not present

## 2014-07-08 DIAGNOSIS — G4733 Obstructive sleep apnea (adult) (pediatric): Secondary | ICD-10-CM | POA: Diagnosis not present

## 2014-07-08 DIAGNOSIS — I2510 Atherosclerotic heart disease of native coronary artery without angina pectoris: Secondary | ICD-10-CM | POA: Diagnosis not present

## 2014-07-08 DIAGNOSIS — E7800 Pure hypercholesterolemia, unspecified: Secondary | ICD-10-CM | POA: Diagnosis not present

## 2014-07-08 DIAGNOSIS — I10 Essential (primary) hypertension: Secondary | ICD-10-CM | POA: Diagnosis not present

## 2014-11-21 ENCOUNTER — Encounter: Payer: Self-pay | Admitting: Interventional Cardiology

## 2014-11-21 ENCOUNTER — Ambulatory Visit (INDEPENDENT_AMBULATORY_CARE_PROVIDER_SITE_OTHER): Payer: Medicare Other | Admitting: Interventional Cardiology

## 2014-11-21 VITALS — BP 150/82 | HR 62 | Ht 70.0 in | Wt 235.0 lb

## 2014-11-21 DIAGNOSIS — I10 Essential (primary) hypertension: Secondary | ICD-10-CM

## 2014-11-21 DIAGNOSIS — I714 Abdominal aortic aneurysm, without rupture, unspecified: Secondary | ICD-10-CM

## 2014-11-21 DIAGNOSIS — E785 Hyperlipidemia, unspecified: Secondary | ICD-10-CM | POA: Diagnosis not present

## 2014-11-21 DIAGNOSIS — I2510 Atherosclerotic heart disease of native coronary artery without angina pectoris: Secondary | ICD-10-CM | POA: Diagnosis not present

## 2014-11-21 HISTORY — DX: Abdominal aortic aneurysm, without rupture, unspecified: I71.40

## 2014-11-21 NOTE — Progress Notes (Signed)
Patient ID: MASSAI HANKERSON, male   DOB: 13-Feb-1941, 73 y.o.   MRN: [id number]     [place], [place], [place]  [address] Phone: [phone] Fax:  [phone]  Date:  11/21/2014   ID:  JACEION ADAY, DOB 1941/11/21, MRN [id number]  PCP:  Garlan Fair, MD      History of Present Illness: ZOLTON DOWSON is a 73 y.o. male with CAD. His SOB persists. He has had a cold and BP has been higher. Just finished antibiotic. He had a reaction to penicillin, augmentin-rash. Now on cough medicine. He has not had any sx like he had before his MI. At that time, he felt indigestion with arm pain. He has had some back pain but this is better. Checking BP at home regularly- every 2 weeks. He had angioedema with ACE-I in the past. Lowest BP at home was 94/70, about a few months ago. Typical reading was usually 124/76 in the past. Highest reading is 694 systolic. Quit cigarettes for the past year. CAD/ASCVD:  c/o Leg edema more at the end of the day.  Denies : Dizziness.  Nitroglycerin.  Orthopnea.  Palpitations.  Syncope.  Finished pulmonary rehab.  BP controlled there. Blood sugar is good after exercise. Breathing was better in the summertime.  He will be restarting some inhalers after contacting Dr. Joya Gaskins.  He wants to play golf at a par 3 course.  He goes to [place] in the spring for fishing season.   Wt Readings from Last 3 Encounters:  02/26/14 229 lb 12.8 oz (104.237 kg)  09/18/13 234 lb (106.142 kg)  09/17/13 233 lb (105.688 kg)     Past Medical History  Diagnosis Date  . Hx of colonic polyps   . Hyperlipidemia   . Erectile dysfunction   . Hypertension   . MI (myocardial infarction)     1976, 1985  . CAD (coronary artery disease)     coronary angioplasty 1995  . Obesity   . Pneumonia   . History of bladder cancer   . Skin cancer   . Kidney stones   . Dyspnea   . Wheezing   . Type 2 diabetes mellitus     Current Outpatient Prescriptions  Medication Sig Dispense  Refill  . albuterol (PROVENTIL HFA;VENTOLIN HFA) 108 (90 BASE) MCG/ACT inhaler Inhale 2 puffs into the lungs every 4 (four) hours as needed for wheezing.    Marland Kitchen aspirin 81 MG tablet Take 81 mg by mouth daily.    Marland Kitchen atorvastatin (LIPITOR) 40 MG tablet Take 1 tablet (40 mg total) by mouth daily. 30 tablet 6  . budesonide-formoterol (SYMBICORT) 160-4.5 MCG/ACT inhaler Inhale 2 puffs into the lungs 2 (two) times daily. 1 Inhaler 12  . cholecalciferol (VITAMIN D) 1000 UNITS tablet Take 1,000 Units by mouth daily.    . Coenzyme Q10 (CO Q-10) 100 MG CAPS Take 1 capsule by mouth daily.    . nadolol (CORGARD) 40 MG tablet Take 40 mg by mouth daily.    . nitroGLYCERIN (NITROSTAT) 0.4 MG SL tablet Place 0.4 mg under the tongue every 5 (five) minutes as needed for chest pain.    . Omega-3 Fatty Acids (RA FISH OIL) 1400 MG CPDR Take by mouth daily.    Marland Kitchen PRESCRIPTION MEDICATION Triflex for arthritis as needed    . Spacer/Aero-Holding Chambers (AEROCHAMBER PLUS) inhaler 1 each by Other route once. Use as instructed     No current facility-administered medications for this visit.  Allergies:    Allergies  Allergen Reactions  . Ace Inhibitors     Angioedema   . Penicillins     Social History:  The patient  reports that he quit smoking about 16 months ago. His smoking use included Cigarettes. He has a 100 pack-year smoking history. He has never used smokeless tobacco. He reports that he drinks alcohol. He reports that he does not use illicit drugs.   Family History:  The patient's family history includes Cancer in his sister; Diabetes in his sister; Heart disease in his mother; Rheumatic fever in his brother and father.   ROS:  Please see the history of present illness.  No nausea, vomiting.  No fevers, chills.  No focal weakness.  No dysuria.    All other systems reviewed and negative.   PHYSICAL EXAM: VS:  There were no vitals taken for this visit. Well nourished, well developed, in no acute  distress HEENT: normal Neck: no JVD, no carotid bruits Cardiac:  normal S1, S2; RRR;  Lungs:  clear to auscultation bilaterally, no wheezing, rhonchi or rales Abd: soft, nontender, no hepatomegaly Ext: no edema Skin: warm and dry Neuro:   no focal abnormalities noted Psych: normal affect   EKG:  6/14; ECG revealed SB with inferior MI. RBBB   ASSESSMENT AND PLAN:  Shortness of breath dyspnea  Improved. Was Likely multifactorial. He wants to get back to more exercise.  MIL is living with him and he has less flexibility to exercise. Marland Kitchen He does have a treadmill at home.  He may restart an inhaler in the winter. Better after pulmonary rehab .  Follows with Dr. Joya Gaskins.   2. CAD in native artery  Continue Lipitor Tablet, 80 MG, 1 tablet, [place], Once a day Notes: No angina. No sx like what he had before MI. PTCA in 1995.  LDL-p to be rechecked next week. . WOuld like to see LDL around 70 given prior MI.    3. Smoker   About a year ago, he stopped completely.    4. AAA /aortic atherosclerosis Notes: Small infrarenal area of ectasia, up to 28-35 mm. Last checked in 11/13.  WIll recheck for aortic aneurysm.       5. Hyperlipidemia: COntinue lipitor 80 mg daily. Welchol added but this was stopped due side effects.  LDL-p > 1400; LDL-p down to 1087 in June 2015 on atorvastatin and fish oil.  Recheck NMR LipoProfile and hepatic panel in 12/15. Encouraged exercise.   Preventive Medicine  Adult topics discussed:  Diet: healthy diet.  Exercise: at least 30 minutes of aerobic exercise, 5 days a week.      Signed, Mina Marble, MD, [facility] 11/21/2014 9:37 AM

## 2014-11-21 NOTE — Patient Instructions (Signed)
Your physician recommends that you continue on your current medications as directed. Please refer to the Current Medication list given to you today.  Your physician has requested that you have an abdominal aorta duplex. During this test, an ultrasound is used to evaluate the aorta. Allow 30 minutes for this exam. Do not eat after midnight the day before and avoid carbonated beverages  Your physician wants you to follow-up in: 1 year with Dr. Irish Lack. You will receive a reminder letter in the mail two months in advance. If you don't receive a letter, please call our office to schedule the follow-up appointment.

## 2014-11-25 ENCOUNTER — Other Ambulatory Visit: Payer: Medicare Other

## 2014-12-23 ENCOUNTER — Other Ambulatory Visit (INDEPENDENT_AMBULATORY_CARE_PROVIDER_SITE_OTHER): Payer: Medicare Other | Admitting: *Deleted

## 2014-12-23 ENCOUNTER — Ambulatory Visit (HOSPITAL_COMMUNITY): Payer: Medicare Other | Attending: Internal Medicine | Admitting: Cardiology

## 2014-12-23 DIAGNOSIS — Z87891 Personal history of nicotine dependence: Secondary | ICD-10-CM | POA: Diagnosis not present

## 2014-12-23 DIAGNOSIS — E119 Type 2 diabetes mellitus without complications: Secondary | ICD-10-CM | POA: Insufficient documentation

## 2014-12-23 DIAGNOSIS — I714 Abdominal aortic aneurysm, without rupture, unspecified: Secondary | ICD-10-CM

## 2014-12-23 DIAGNOSIS — E785 Hyperlipidemia, unspecified: Secondary | ICD-10-CM | POA: Diagnosis not present

## 2014-12-23 DIAGNOSIS — I2510 Atherosclerotic heart disease of native coronary artery without angina pectoris: Secondary | ICD-10-CM | POA: Diagnosis not present

## 2014-12-23 DIAGNOSIS — I10 Essential (primary) hypertension: Secondary | ICD-10-CM | POA: Diagnosis not present

## 2014-12-23 LAB — HEPATIC FUNCTION PANEL
ALBUMIN: 3.8 g/dL (ref 3.5–5.2)
ALT: 32 U/L (ref 0–53)
AST: 27 U/L (ref 0–37)
Alkaline Phosphatase: 99 U/L (ref 39–117)
Bilirubin, Direct: 0.2 mg/dL (ref 0.0–0.3)
TOTAL PROTEIN: 7.6 g/dL (ref 6.0–8.3)
Total Bilirubin: 0.7 mg/dL (ref 0.2–1.2)

## 2014-12-23 NOTE — Progress Notes (Signed)
Aorto-iliac duplex performed 

## 2014-12-25 LAB — NMR LIPOPROFILE WITH LIPIDS
Cholesterol, Total: 159 mg/dL (ref 100–199)
HDL Particle Number: 28.8 umol/L — ABNORMAL LOW (ref >=30.5)
HDL SIZE: 8.7 nm — AB (ref >=9.2)
HDL-C: 45 mg/dL (ref >39)
LDL (calc): 88 mg/dL (ref 0–99)
LDL PARTICLE NUMBER: 1272 nmol/L — AB (ref <1000)
LDL Size: 20.5 nm (ref >=20.8)
LP-IR Score: 69 — ABNORMAL HIGH (ref <=45)
Large HDL-P: 3.1 umol/L — ABNORMAL LOW (ref >=4.8)
Large VLDL-P: 4 nmol/L — ABNORMAL HIGH (ref <=2.7)
Small LDL Particle Number: 531 nmol/L — ABNORMAL HIGH (ref <=527)
Triglycerides: 129 mg/dL (ref 0–149)
VLDL SIZE: 49.5 nm — AB (ref <=46.6)

## 2014-12-31 ENCOUNTER — Other Ambulatory Visit: Payer: Self-pay | Admitting: *Deleted

## 2014-12-31 MED ORDER — ATORVASTATIN CALCIUM 80 MG PO TABS: 80.0000 mg | ORAL_TABLET | Freq: Every day | ORAL | Status: DC

## 2015-02-22 ENCOUNTER — Emergency Department (HOSPITAL_BASED_OUTPATIENT_CLINIC_OR_DEPARTMENT_OTHER): Payer: Medicare Other

## 2015-02-22 ENCOUNTER — Encounter (HOSPITAL_BASED_OUTPATIENT_CLINIC_OR_DEPARTMENT_OTHER): Payer: Self-pay | Admitting: Emergency Medicine

## 2015-02-22 ENCOUNTER — Emergency Department (HOSPITAL_BASED_OUTPATIENT_CLINIC_OR_DEPARTMENT_OTHER)
Admission: EM | Admit: 2015-02-22 | Discharge: 2015-02-23 | Disposition: A | Discharge status: Home / Self Care | Payer: Medicare Other | Attending: Emergency Medicine | Admitting: Emergency Medicine

## 2015-02-22 DIAGNOSIS — Z86010 Personal history of colonic polyps: Secondary | ICD-10-CM | POA: Diagnosis not present

## 2015-02-22 DIAGNOSIS — E669 Obesity, unspecified: Secondary | ICD-10-CM | POA: Insufficient documentation

## 2015-02-22 DIAGNOSIS — Z87438 Personal history of other diseases of male genital organs: Secondary | ICD-10-CM | POA: Insufficient documentation

## 2015-02-22 DIAGNOSIS — S66919A Strain of unspecified muscle, fascia and tendon at wrist and hand level, unspecified hand, initial encounter: Secondary | ICD-10-CM | POA: Diagnosis not present

## 2015-02-22 DIAGNOSIS — Z7982 Long term (current) use of aspirin: Secondary | ICD-10-CM | POA: Diagnosis not present

## 2015-02-22 DIAGNOSIS — Z8701 Personal history of pneumonia (recurrent): Secondary | ICD-10-CM | POA: Diagnosis not present

## 2015-02-22 DIAGNOSIS — S199XXA Unspecified injury of neck, initial encounter: Secondary | ICD-10-CM | POA: Insufficient documentation

## 2015-02-22 DIAGNOSIS — I10 Essential (primary) hypertension: Secondary | ICD-10-CM | POA: Insufficient documentation

## 2015-02-22 DIAGNOSIS — S46912A Strain of unspecified muscle, fascia and tendon at shoulder and upper arm level, left arm, initial encounter: Secondary | ICD-10-CM | POA: Diagnosis not present

## 2015-02-22 DIAGNOSIS — X58XXXA Exposure to other specified factors, initial encounter: Secondary | ICD-10-CM | POA: Insufficient documentation

## 2015-02-22 DIAGNOSIS — E785 Hyperlipidemia, unspecified: Secondary | ICD-10-CM | POA: Insufficient documentation

## 2015-02-22 DIAGNOSIS — Z87442 Personal history of urinary calculi: Secondary | ICD-10-CM | POA: Diagnosis not present

## 2015-02-22 DIAGNOSIS — Z79899 Other long term (current) drug therapy: Secondary | ICD-10-CM | POA: Diagnosis not present

## 2015-02-22 DIAGNOSIS — R52 Pain, unspecified: Secondary | ICD-10-CM

## 2015-02-22 DIAGNOSIS — Z880 Allergy status to penicillin: Secondary | ICD-10-CM | POA: Insufficient documentation

## 2015-02-22 DIAGNOSIS — Z87891 Personal history of nicotine dependence: Secondary | ICD-10-CM | POA: Insufficient documentation

## 2015-02-22 DIAGNOSIS — S46919A Strain of unspecified muscle, fascia and tendon at shoulder and upper arm level, unspecified arm, initial encounter: Secondary | ICD-10-CM | POA: Diagnosis not present

## 2015-02-22 DIAGNOSIS — S161XXA Strain of muscle, fascia and tendon at neck level, initial encounter: Secondary | ICD-10-CM | POA: Diagnosis not present

## 2015-02-22 DIAGNOSIS — S4992XA Unspecified injury of left shoulder and upper arm, initial encounter: Secondary | ICD-10-CM | POA: Diagnosis present

## 2015-02-22 DIAGNOSIS — I4510 Unspecified right bundle-branch block: Secondary | ICD-10-CM | POA: Diagnosis not present

## 2015-02-22 DIAGNOSIS — Y998 Other external cause status: Secondary | ICD-10-CM | POA: Diagnosis not present

## 2015-02-22 DIAGNOSIS — Y9389 Activity, other specified: Secondary | ICD-10-CM | POA: Diagnosis not present

## 2015-02-22 DIAGNOSIS — Z8551 Personal history of malignant neoplasm of bladder: Secondary | ICD-10-CM | POA: Insufficient documentation

## 2015-02-22 DIAGNOSIS — Z85828 Personal history of other malignant neoplasm of skin: Secondary | ICD-10-CM | POA: Diagnosis not present

## 2015-02-22 DIAGNOSIS — E119 Type 2 diabetes mellitus without complications: Secondary | ICD-10-CM | POA: Insufficient documentation

## 2015-02-22 DIAGNOSIS — I2510 Atherosclerotic heart disease of native coronary artery without angina pectoris: Secondary | ICD-10-CM | POA: Diagnosis not present

## 2015-02-22 DIAGNOSIS — J449 Chronic obstructive pulmonary disease, unspecified: Secondary | ICD-10-CM | POA: Diagnosis not present

## 2015-02-22 DIAGNOSIS — I252 Old myocardial infarction: Secondary | ICD-10-CM | POA: Insufficient documentation

## 2015-02-22 DIAGNOSIS — T148XXA Other injury of unspecified body region, initial encounter: Secondary | ICD-10-CM

## 2015-02-22 DIAGNOSIS — Y9289 Other specified places as the place of occurrence of the external cause: Secondary | ICD-10-CM | POA: Insufficient documentation

## 2015-02-22 DIAGNOSIS — M19012 Primary osteoarthritis, left shoulder: Secondary | ICD-10-CM | POA: Diagnosis not present

## 2015-02-22 IMAGING — DX DG CHEST 2V
2 series · 2 of 2 positions shown · non-contrast
Comparison: Chest radiograph June 15, 2013

ADDENDUM:
Upon re-review, LEFT upper lobe granuloma.

  By: [REDACTED]AL DATA:  LEFT shoulder pain beginning this evening, heavy
lifting yesterday. History of hypertension, coronary artery disease,
COPD, and myocardial infarction.
EXAM:
CHEST  2 VIEW

[chest pa]
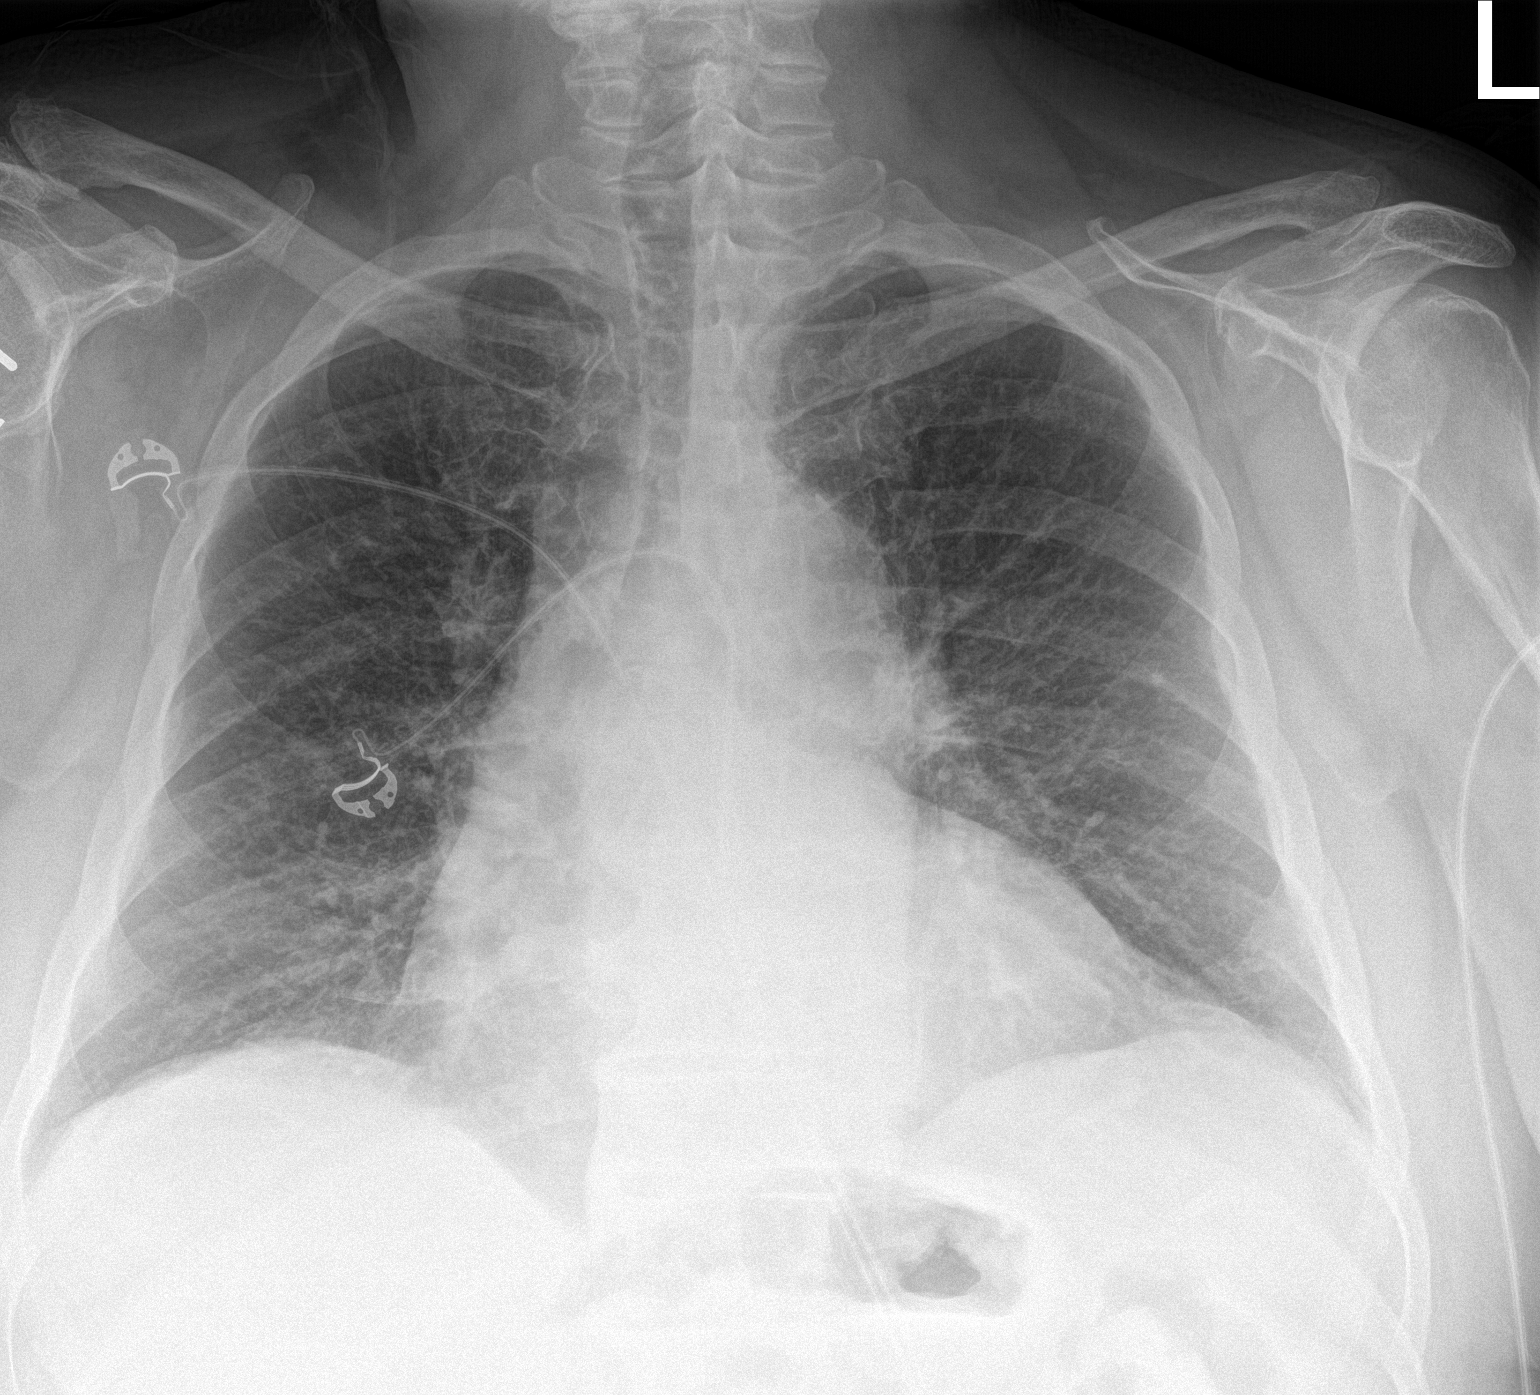

[chest lat]
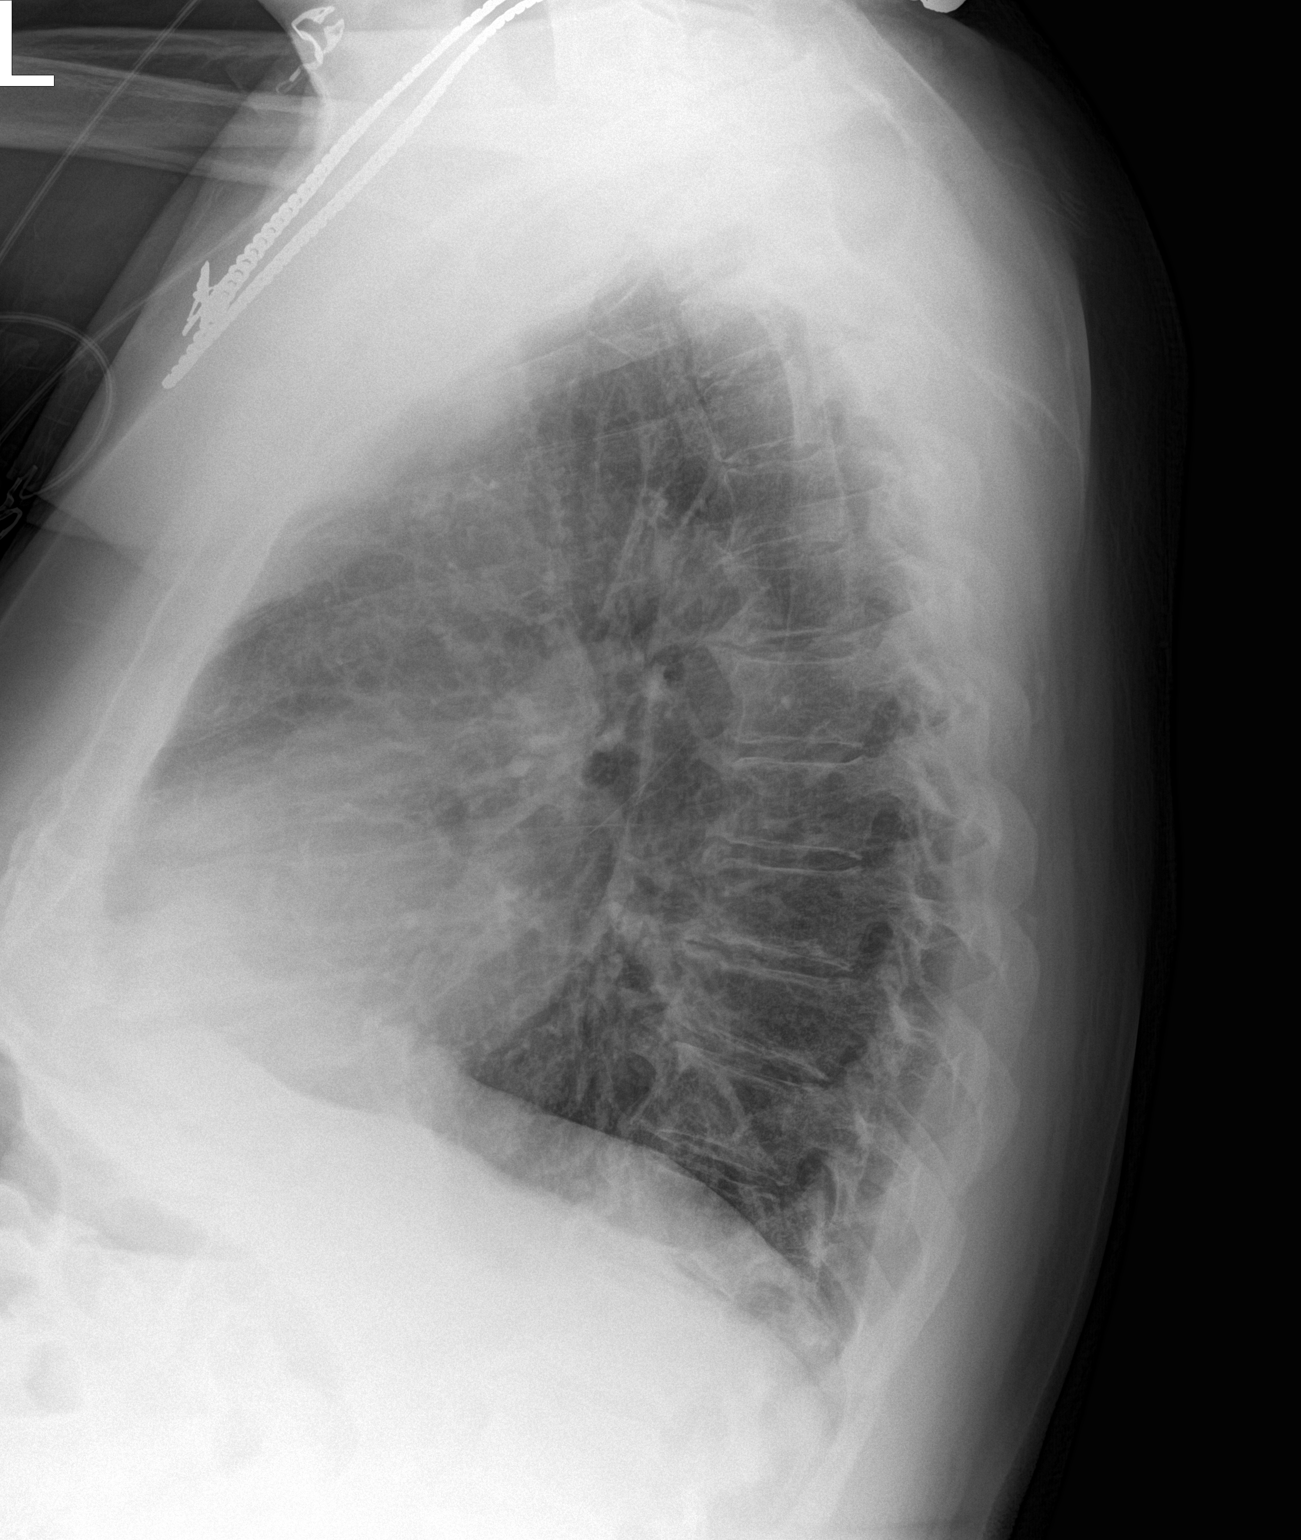

[2 of 2 positions shown; findings below may reference images not displayed]

FINDINGS: The cardiac silhouette is moderately enlarged, unchanged. Mild
chronic interstitial changes with increased lung volumes, flattened
hemidiaphragm which can be seen with COPD. No pleural effusion or
focal consolidation. No pneumothorax. Mild degenerative change of
the thoracic spine.
IMPRESSION: Stable cardiomegaly, COPD.

By: Freddi Chai

## 2015-02-22 IMAGING — DX DG SHOULDER 2+V*L*
3 series · 5 of 5 positions shown · non-contrast
Comparison: None.

CLINICAL DATA: LEFT shoulder pain beginning this evening, heavy
lifting yesterday.

EXAM:
LEFT SHOULDER - 2+ VIEW

[shoulder grashey]
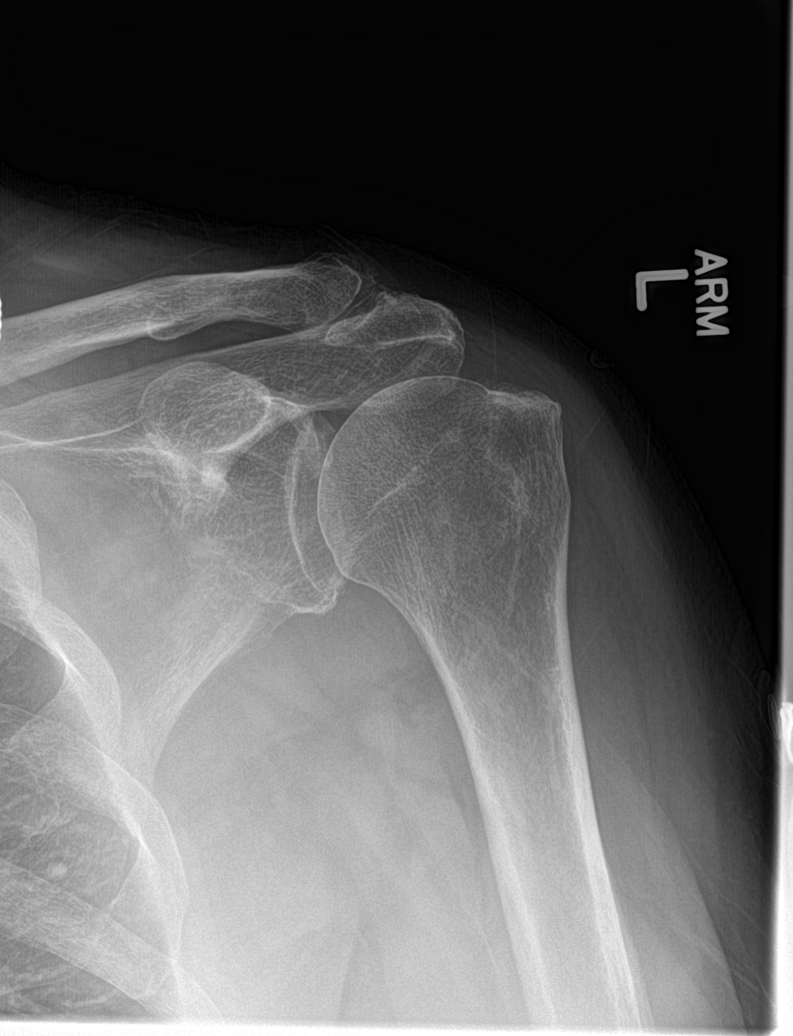

[Series 2: shoulder y view · 0.14mm/px · 2 of 2 slices shown]
[im 1/2]
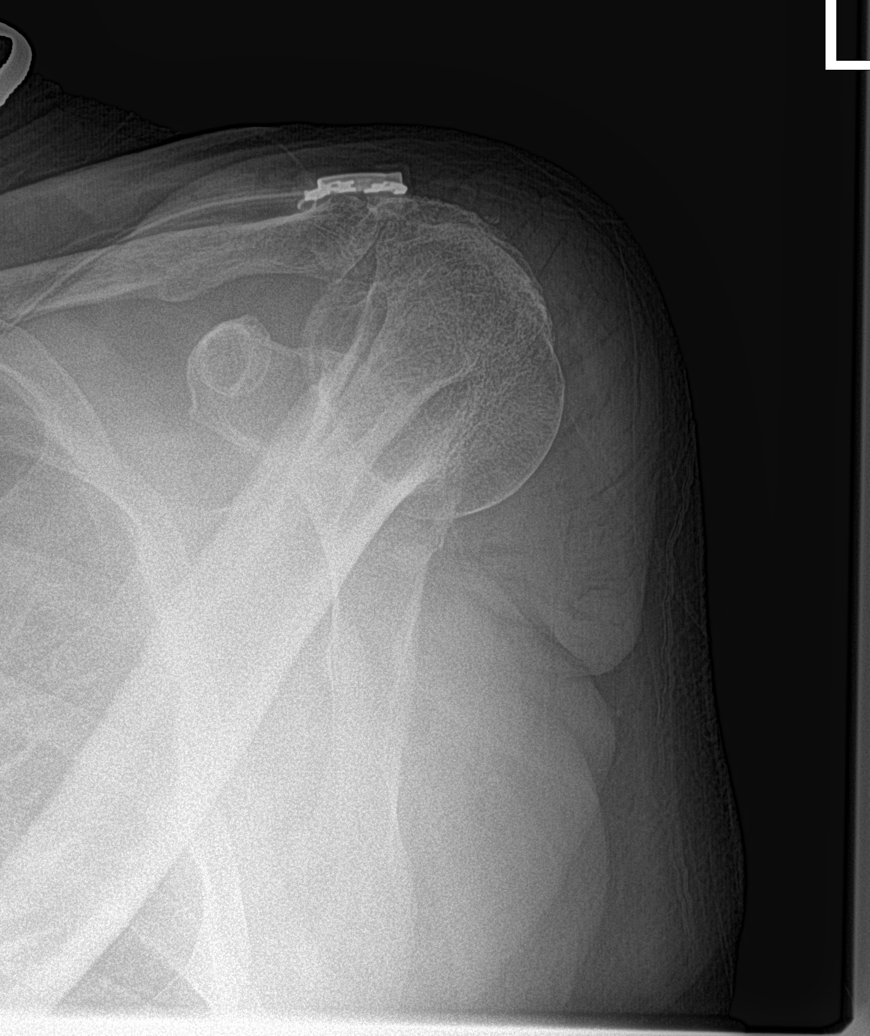
[im 2/2]
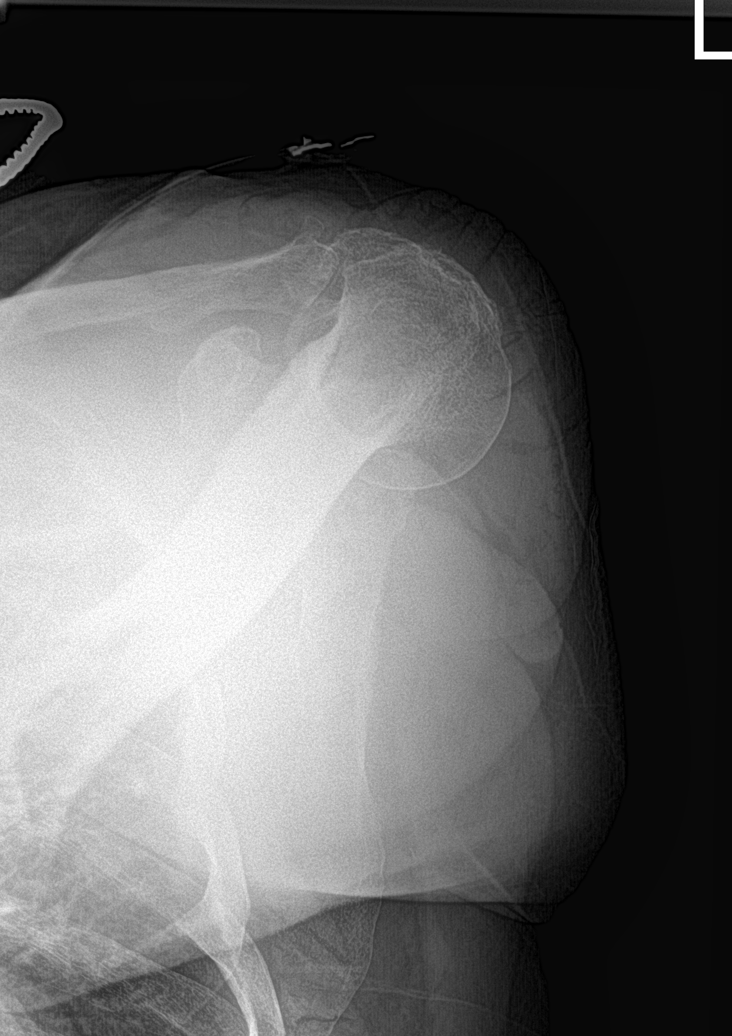

[Series 3: shoulder axillary · 0.14mm/px · 2 of 2 slices shown]
[im 1/2]
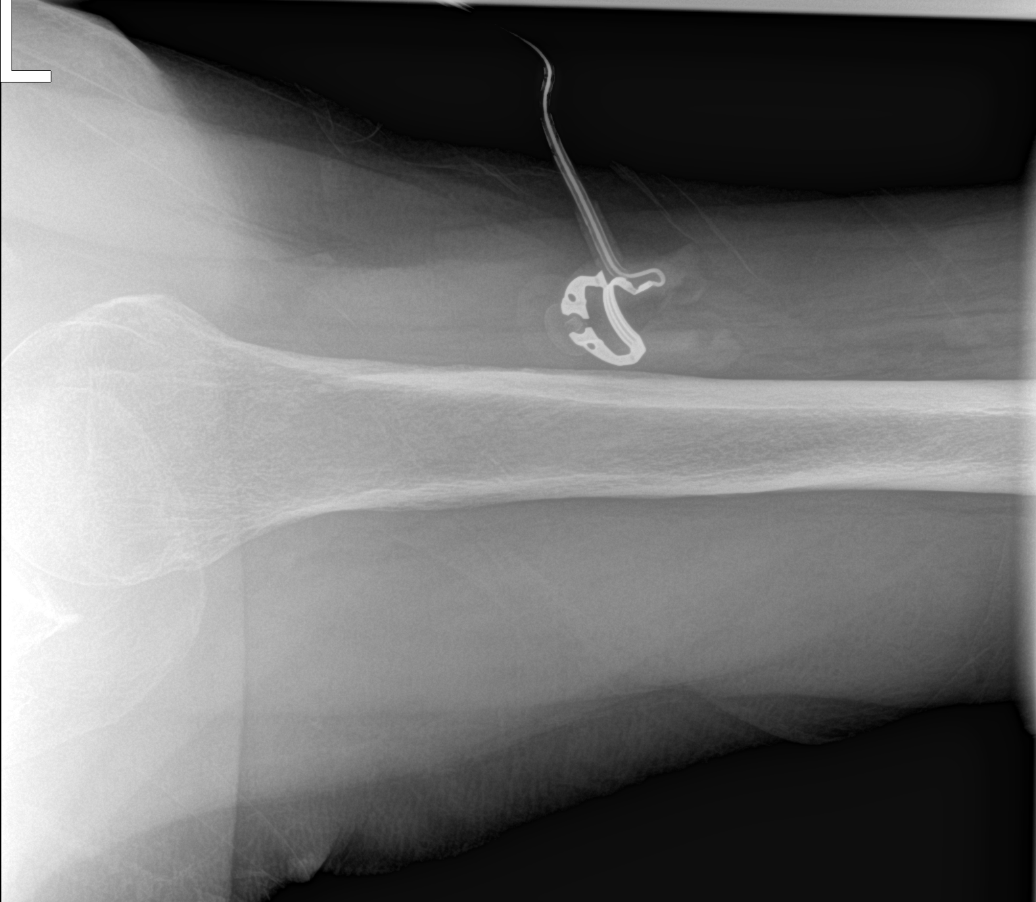
[im 2/2]
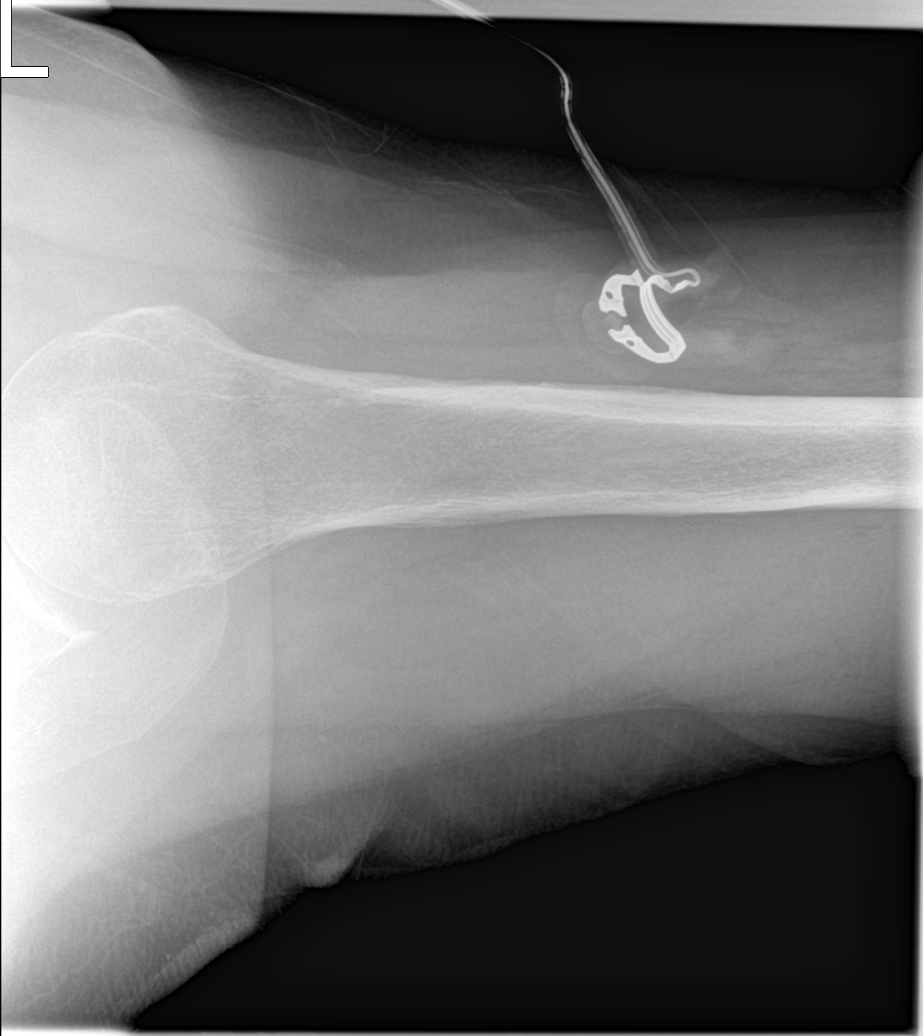

[5 of 5 positions shown; findings below may reference images not displayed]

FINDINGS: No acute fracture deformity or dislocation. Moderate
acromioclavicular joint space narrowing, marginal spurring
consistent with osteoarthrosis. Joint space intact without erosions.
No destructive bony lesions. Soft tissue planes are not suspicious.
IMPRESSION: No acute osseous process. Moderate acromioclavicular osteoarthrosis.

  By: Don Lolito Onacram

## 2015-02-22 MED ORDER — METHOCARBAMOL 1000 MG/10ML IJ SOLN
1000.0000 mg | Freq: Once | INTRAMUSCULAR | Status: AC
  Administered 2015-02-23: 1000 mg via INTRAVENOUS
  Filled 2015-02-22: qty 10

## 2015-02-22 MED ORDER — KETOROLAC TROMETHAMINE 30 MG/ML IJ SOLN
30.0000 mg | Freq: Once | INTRAMUSCULAR | Status: AC
  Administered 2015-02-23: 30 mg via INTRAVENOUS
  Filled 2015-02-22: qty 1

## 2015-02-22 NOTE — ED Provider Notes (Signed)
CSN: [id number]     Arrival date & time 02/22/15  2113 History  This chart was scribed for Taahir Grisby, MD by Edison Simon, ED Scribe. This patient was seen in room MH10/MH10 and the patient's care was started at 11:22 PM.    Chief Complaint  Patient presents with  . Shoulder Pain   Patient is a 74 y.o. male presenting with shoulder pain. The history is provided by the patient. No language interpreter was used.  Shoulder Pain Location:  Shoulder Time since incident:  1 day Injury: yes   Mechanism of injury comment:  Lifting heavy objects Shoulder location:  L shoulder Pain details:    Quality:  Sharp   Radiates to:  L wrist   Severity:  Moderate   Onset quality:  Gradual   Duration:  1 day   Timing:  Intermittent   Progression:  Unchanged Chronicity:  New Dislocation: no   Foreign body present:  No foreign bodies Tetanus status:  Unknown Prior injury to area:  No Relieved by:  None tried Exacerbated by: twisting. Ineffective treatments:  None tried Associated symptoms: neck pain   Associated symptoms: no fever     HPI Comments: Steven Newman is a 74 y.o. male with history of MI, who presents to the Emergency Department complaining of pain to left neck, shoulder, and wrist at times with onset this morning, worsening at 1400 while watching television. He states sharp pain always occurs when turning his head; when still, he states it is intermittently sharp when still. He denies any injury but notes he was lifting heavy bags of fertilizer yesterday, the heaviest of which were 50 lb. He denies any recent medication changes. He states he has used Tylenol and Aleve without remission and states ice has made the pain worse. He denies fever, congestion, or rash.  PCP: Garlan Fair, MD   Past Medical History  Diagnosis Date  . Hx of colonic polyps   . Hyperlipidemia   . Erectile dysfunction   . Hypertension   . MI (myocardial infarction)     1976, 1985  . CAD (coronary artery  disease)     coronary angioplasty 1995  . Obesity   . Pneumonia   . History of bladder cancer   . Skin cancer   . Kidney stones   . Dyspnea   . Wheezing   . Type 2 diabetes mellitus    Past Surgical History  Procedure Laterality Date  . Bladder cancer surgery    . Neck tumor    . 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 Heart disease Mother   . Rheumatic fever Father   . Rheumatic fever Brother   . Diabetes Sister   . Cancer Sister     cancer on eyelid   History  Substance Use Topics  . Smoking status: Former Smoker -- 2.00 packs/day for 50 years    Types: Cigarettes    Quit date: 07/06/2013  . Smokeless tobacco: Never Used  . Alcohol Use: Yes     Comment: socially    Review of Systems  Constitutional: Negative for fever.  HENT: Negative for congestion.   Musculoskeletal: Positive for neck pain.       Pain to left neck, shoulder, and wrist  Skin: Negative for rash.  All other systems reviewed and are negative.     Allergies  Ace inhibitors and Penicillins  Home Medications   Prior  to Admission medications   Medication Sig Start Date End Date Taking? Authorizing Provider  aspirin 81 MG tablet Take 81 mg by mouth daily.    Historical Provider, MD  atorvastatin (LIPITOR) 80 MG tablet Take 1 tablet (80 mg total) by mouth daily. 12/31/14   Jettie Booze, MD  Cholecalciferol (VITAMIN D) 2000 UNITS tablet Take 2,000 Units by mouth daily.    Historical Provider, MD  Coenzyme Q10 (CO Q-10) 100 MG CAPS Take 1 capsule by mouth daily.    Historical Provider, MD  nadolol (CORGARD) 40 MG tablet Take 40 mg by mouth daily.    Historical Provider, MD  nitroGLYCERIN (NITROSTAT) 0.4 MG SL tablet Place 0.4 mg under the tongue every 5 (five) minutes as needed for chest pain.    Historical Provider, MD  Omega-3 Fatty Acids (RA FISH OIL) 1400 MG CPDR Take by mouth daily.    Historical Provider, MD  [place]  for arthritis as needed    Historical Provider, MD   BP 168/89 mmHg  Pulse 80  Temp(Src) 98.6 F (37 C) (Oral)  Wt 221 lb (100.245 kg)  SpO2 94% Physical Exam  Constitutional: He is oriented to person, place, and time. He appears well-developed and well-nourished. No distress.  HENT:  Head: Normocephalic and atraumatic.  Mouth/Throat: Oropharynx is clear and moist.  Eyes: Conjunctivae are normal. Pupils are equal, round, and reactive to light.  Neck: Normal range of motion. Neck supple. No JVD present. No tracheal deviation present.  Pain along border of l trapezius muscle  Cardiovascular: Normal rate, regular rhythm and normal heart sounds.   No murmur heard. Pulmonary/Chest: Effort normal and breath sounds normal. No stridor. No respiratory distress. He has no wheezes. He has no rales.  Abdominal: Soft. There is no tenderness. There is no rebound and no guarding.  hyperactive bowel sounds  Musculoskeletal: Normal range of motion.  Neer test negative on left Clavicle is intact No winging of the scapula  Lymphadenopathy:    He has no cervical adenopathy.  Neurological: He is alert and oriented to person, place, and time. He has normal reflexes.  Good DTRs, 5/5 throughout  Skin: Skin is warm and dry.  Psychiatric: He has a normal mood and affect.  Nursing note and vitals reviewed.   ED Course  Procedures (including critical care time)  DIAGNOSTIC STUDIES: Oxygen Saturation is 94% on room air, adequate by my interpretation.    COORDINATION OF CARE: 11:30 PM Discussed treatment plan with patient at beside, the patient agrees with the plan and has no further questions at this time.   Labs Review Labs Reviewed - No data to display  Imaging Review No results found.   EKG Interpretation None      MDM   Final diagnoses:  None     EKG Interpretation  Date/Time:  Saturday February 22 2015 23:19:45 EDT Ventricular Rate:  74 PR Interval:  170 QRS Duration: 134 QT  Interval:  404 QTC Calculation: 448 R Axis:   -71 Text Interpretation:  Normal sinus rhythm Left axis deviation Right bundle branch block Inferior infarct , age undetermined Confirmed by [facility]  MD, Zhaire Locker ([id number]) on 02/22/2015 11:37:36 PM      Rules out for MI with 2 negative sets of enzymes considering pain is worse with neck movement the symptoms are consistent with MSK injury.  Will give ultram and diclofenac patches and refer to sports medicine.  Pain markedly improved post medication  I personally performed the services  described in this documentation, which was scribed in my presence. The recorded information has been reviewed and is accurate.     Veatrice Kells, MD 02/23/15 [phone]

## 2015-02-22 NOTE — ED Notes (Signed)
Pt reports left shoulder pain onset this PM states was lifting bags of fertilizer yesterday but pain started this evening denies event or injury

## 2015-02-23 ENCOUNTER — Encounter (HOSPITAL_BASED_OUTPATIENT_CLINIC_OR_DEPARTMENT_OTHER): Payer: Self-pay | Admitting: Emergency Medicine

## 2015-02-23 DIAGNOSIS — M19012 Primary osteoarthritis, left shoulder: Secondary | ICD-10-CM | POA: Diagnosis not present

## 2015-02-23 DIAGNOSIS — J449 Chronic obstructive pulmonary disease, unspecified: Secondary | ICD-10-CM | POA: Diagnosis not present

## 2015-02-23 DIAGNOSIS — S46912A Strain of unspecified muscle, fascia and tendon at shoulder and upper arm level, left arm, initial encounter: Secondary | ICD-10-CM | POA: Diagnosis not present

## 2015-02-23 LAB — CBC WITH DIFFERENTIAL/PLATELET
BASOS PCT: 0 % (ref 0–1)
Basophils Absolute: 0 10*3/uL (ref 0.0–0.1)
Eosinophils Absolute: 0.4 10*3/uL (ref 0.0–0.7)
Eosinophils Relative: 4 % (ref 0–5)
HCT: 45.2 % (ref 39.0–52.0)
HEMOGLOBIN: 14.7 g/dL (ref 13.0–17.0)
Lymphocytes Relative: 15 % (ref 12–46)
Lymphs Abs: 1.7 10*3/uL (ref 0.7–4.0)
MCH: 30.8 pg (ref 26.0–34.0)
MCHC: 32.5 g/dL (ref 30.0–36.0)
MCV: 94.8 fL (ref 78.0–100.0)
Monocytes Absolute: 1.7 10*3/uL — ABNORMAL HIGH (ref 0.1–1.0)
Monocytes Relative: 15 % — ABNORMAL HIGH (ref 3–12)
NEUTROS ABS: 7.3 10*3/uL (ref 1.7–7.7)
Neutrophils Relative %: 66 % (ref 43–77)
Platelets: 122 10*3/uL — ABNORMAL LOW (ref 150–400)
RBC: 4.77 MIL/uL (ref 4.22–5.81)
RDW: 15 % (ref 11.5–15.5)
WBC: 11 10*3/uL — ABNORMAL HIGH (ref 4.0–10.5)

## 2015-02-23 LAB — BASIC METABOLIC PANEL
Anion gap: 8 (ref 5–15)
BUN: 32 mg/dL — ABNORMAL HIGH (ref 6–23)
CO2: 23 mmol/L (ref 19–32)
Calcium: 9 mg/dL (ref 8.4–10.5)
Chloride: 110 mmol/L (ref 96–112)
Creatinine, Ser: 1.27 mg/dL (ref 0.50–1.35)
GFR calc Af Amer: 62 mL/min — ABNORMAL LOW (ref >90)
GFR, EST NON AFRICAN AMERICAN: 54 mL/min — AB (ref >90)
Glucose, Bld: 137 mg/dL — ABNORMAL HIGH (ref 70–99)
Potassium: 4.4 mmol/L (ref 3.5–5.1)
Sodium: 141 mmol/L (ref 135–145)

## 2015-02-23 LAB — TROPONIN I
TROPONIN I: 0.03 ng/mL (ref <0.031)
Troponin I: 0.03 ng/mL (ref <0.031)

## 2015-02-23 MED ORDER — DICLOFENAC EPOLAMINE 1.3 % TD PTCH: 1.0000 | MEDICATED_PATCH | Freq: Two times a day (BID) | TRANSDERMAL | Status: DC

## 2015-02-23 MED ORDER — TRAMADOL HCL 50 MG PO TABS
50.0000 mg | ORAL_TABLET | Freq: Once | ORAL | Status: AC
  Administered 2015-02-23: 50 mg via ORAL
  Filled 2015-02-23: qty 1

## 2015-02-23 MED ORDER — TRAMADOL HCL 50 MG PO TABS: 50.0000 mg | ORAL_TABLET | Freq: Four times a day (QID) | ORAL | Status: DC | PRN

## 2015-02-27 ENCOUNTER — Encounter: Payer: Self-pay | Admitting: Family Medicine

## 2015-02-27 ENCOUNTER — Ambulatory Visit (INDEPENDENT_AMBULATORY_CARE_PROVIDER_SITE_OTHER): Payer: Medicare Other | Admitting: Family Medicine

## 2015-02-27 VITALS — BP 166/97 | HR 62 | Ht 69.0 in | Wt 228.0 lb

## 2015-02-27 DIAGNOSIS — M542 Cervicalgia: Secondary | ICD-10-CM

## 2015-02-27 MED ORDER — TRAMADOL HCL 50 MG PO TABS: 50.0000 mg | ORAL_TABLET | Freq: Four times a day (QID) | ORAL | Status: DC | PRN

## 2015-02-27 NOTE — Patient Instructions (Signed)
Your pain is likely due to an irritated nerve in your neck - either from arthritis or a bulging disc. Take tylenol 500mg  1-2 tabs three times a day for pain as needed. Voltaren gel topically up to 4 times a day if needed. Tramadol as needed for severe pain (only if the tylenol, voltaren gel aren't working). Heat as needed 15 minutes at a time for spasms. If you don't continue to improve over the next couple weeks call me. Options at that point would include prednisone dose pack or physical therapy. Follow up with me in 1 month or as needed otherwise.

## 2015-03-05 DIAGNOSIS — M542 Cervicalgia: Secondary | ICD-10-CM

## 2015-03-05 HISTORY — DX: Cervicalgia: M54.2

## 2015-03-05 NOTE — Progress Notes (Signed)
PCP: Garlan Fair, MD  Subjective:   HPI: Patient is a 74 y.o. male here for left neck/shoulder pain.  Patient reports on 3/19 in the afternoon he started to develop left sided neck and shoulder pain. Pain worse turning his neck to the left. No numbness/tingling. Had something similar about 10-12 years ago but improved. Recalls the day before he was lifting some heavy fertilizer bags. Feels improved today at 2/10 level of pain. Took some tramadol as needed.  Past Medical History  Diagnosis Date  . Hx of colonic polyps   . Hyperlipidemia   . Erectile dysfunction   . Hypertension   . MI (myocardial infarction)     1976, 1985  . CAD (coronary artery disease)     coronary angioplasty 1995  . Obesity   . Pneumonia   . History of bladder cancer   . Skin cancer   . Kidney stones   . Dyspnea   . Wheezing   . Type 2 diabetes mellitus     Current Outpatient Prescriptions on File Prior to Visit  Medication Sig Dispense Refill  . aspirin 81 MG tablet Take 81 mg by mouth daily.    Marland Kitchen atorvastatin (LIPITOR) 80 MG tablet Take 1 tablet (80 mg total) by mouth daily. 30 tablet 6  . Cholecalciferol (VITAMIN D) 2000 UNITS tablet Take 2,000 Units by mouth daily.    . Coenzyme Q10 (CO Q-10) 100 MG CAPS Take 1 capsule by mouth daily.    . diclofenac (FLECTOR) 1.3 % PTCH Place 1 patch onto the skin 2 (two) times daily. 20 patch 0  . nadolol (CORGARD) 40 MG tablet Take 40 mg by mouth daily.    . nitroGLYCERIN (NITROSTAT) 0.4 MG SL tablet Place 0.4 mg under the tongue every 5 (five) minutes as needed for chest pain.    . Omega-3 Fatty Acids (RA FISH OIL) 1400 MG CPDR Take by mouth daily.    Marland Kitchen PRESCRIPTION MEDICATION Triflex for arthritis as needed     No current facility-administered medications on file prior to visit.    Past Surgical History  Procedure Laterality Date  . Bladder cancer surgery    . Neck tumor    . 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 Ace Inhibitors     Angioedema   . Penicillins     History   Social History  . Marital Status: Married    Spouse Name: N/A  . Number of Children: N/A  . Years of Education: N/A   Occupational History  . Retired    Social History Main Topics  . Smoking status: Former Smoker -- 2.00 packs/day for 50 years    Types: Cigarettes    Quit date: 07/06/2013  . Smokeless tobacco: Never Used  . Alcohol Use: 0.0 oz/week    0 Standard drinks or equivalent per week     Comment: socially  . Drug Use: No  . Sexual Activity: Not on file   Other Topics Concern  . Not on file   Social History Narrative    Family History  Problem Relation Age of Onset  . Heart disease Mother   . Rheumatic fever Father   . Rheumatic fever Brother   . Diabetes Sister   . Cancer Sister     cancer on eyelid    BP 166/97 mmHg  Pulse 62  Ht 5\' 9"  (1.753 m)  Wt 228 lb (103.42 kg)  BMI 33.65 kg/m2  Review of Systems: See HPI above.    Objective:  Physical Exam:  Gen: NAD  Neck: No gross deformity, swelling, bruising. TTP left paraspinal region.  No midline/bony TTP. FROM neck - pain on left lateral rotation. BUE strength 5/5.   Sensation intact to light touch.   2+ equal reflexes in triceps, biceps, brachioradialis tendons. Negative spurlings. NV intact distal BUEs.  Left shoulder: No swelling, ecchymoses.  No gross deformity. No TTP. FROM. Negative Hawkins, Neers. Negative Speeds, Yergasons. Strength 5/5 with empty can and resisted internal/external rotation. Negative apprehension. NV intact distally.    Assessment & Plan:  1. Neck pain - consistent with degenerative disc disease likely irritating a cervical nerve root.  Improved today.  Tylenol with voltaren gel, tramadol as needed.  Heat as needed for spasms.  Consider prednisone, physical therapy if not improving.  F/u in 1 month or prn.

## 2015-03-05 NOTE — Assessment & Plan Note (Signed)
consistent with degenerative disc disease likely irritating a cervical nerve root.  Improved today.  Tylenol with voltaren gel, tramadol as needed.  Heat as needed for spasms.  Consider prednisone, physical therapy if not improving.  F/u in 1 month or prn.

## 2015-03-24 DIAGNOSIS — M9903 Segmental and somatic dysfunction of lumbar region: Secondary | ICD-10-CM | POA: Diagnosis not present

## 2015-03-24 DIAGNOSIS — M545 Low back pain: Secondary | ICD-10-CM | POA: Diagnosis not present

## 2015-03-24 DIAGNOSIS — M791 Myalgia: Secondary | ICD-10-CM | POA: Diagnosis not present

## 2015-03-24 DIAGNOSIS — M9904 Segmental and somatic dysfunction of sacral region: Secondary | ICD-10-CM | POA: Diagnosis not present

## 2015-03-24 DIAGNOSIS — M9905 Segmental and somatic dysfunction of pelvic region: Secondary | ICD-10-CM | POA: Diagnosis not present

## 2015-03-25 DIAGNOSIS — M9905 Segmental and somatic dysfunction of pelvic region: Secondary | ICD-10-CM | POA: Diagnosis not present

## 2015-03-25 DIAGNOSIS — M9904 Segmental and somatic dysfunction of sacral region: Secondary | ICD-10-CM | POA: Diagnosis not present

## 2015-03-25 DIAGNOSIS — M791 Myalgia: Secondary | ICD-10-CM | POA: Diagnosis not present

## 2015-03-25 DIAGNOSIS — M545 Low back pain: Secondary | ICD-10-CM | POA: Diagnosis not present

## 2015-03-25 DIAGNOSIS — M9903 Segmental and somatic dysfunction of lumbar region: Secondary | ICD-10-CM | POA: Diagnosis not present

## 2015-03-27 DIAGNOSIS — M9903 Segmental and somatic dysfunction of lumbar region: Secondary | ICD-10-CM | POA: Diagnosis not present

## 2015-03-27 DIAGNOSIS — M791 Myalgia: Secondary | ICD-10-CM | POA: Diagnosis not present

## 2015-03-27 DIAGNOSIS — M9904 Segmental and somatic dysfunction of sacral region: Secondary | ICD-10-CM | POA: Diagnosis not present

## 2015-03-27 DIAGNOSIS — M545 Low back pain: Secondary | ICD-10-CM | POA: Diagnosis not present

## 2015-03-27 DIAGNOSIS — M9905 Segmental and somatic dysfunction of pelvic region: Secondary | ICD-10-CM | POA: Diagnosis not present

## 2015-03-31 DIAGNOSIS — M545 Low back pain: Secondary | ICD-10-CM | POA: Diagnosis not present

## 2015-03-31 DIAGNOSIS — M791 Myalgia: Secondary | ICD-10-CM | POA: Diagnosis not present

## 2015-03-31 DIAGNOSIS — M9903 Segmental and somatic dysfunction of lumbar region: Secondary | ICD-10-CM | POA: Diagnosis not present

## 2015-03-31 DIAGNOSIS — M9904 Segmental and somatic dysfunction of sacral region: Secondary | ICD-10-CM | POA: Diagnosis not present

## 2015-03-31 DIAGNOSIS — M9905 Segmental and somatic dysfunction of pelvic region: Secondary | ICD-10-CM | POA: Diagnosis not present

## 2015-04-02 DIAGNOSIS — M545 Low back pain: Secondary | ICD-10-CM | POA: Diagnosis not present

## 2015-04-02 DIAGNOSIS — M9903 Segmental and somatic dysfunction of lumbar region: Secondary | ICD-10-CM | POA: Diagnosis not present

## 2015-04-02 DIAGNOSIS — M9904 Segmental and somatic dysfunction of sacral region: Secondary | ICD-10-CM | POA: Diagnosis not present

## 2015-04-02 DIAGNOSIS — M791 Myalgia: Secondary | ICD-10-CM | POA: Diagnosis not present

## 2015-04-02 DIAGNOSIS — M9905 Segmental and somatic dysfunction of pelvic region: Secondary | ICD-10-CM | POA: Diagnosis not present

## 2015-04-04 DIAGNOSIS — M9905 Segmental and somatic dysfunction of pelvic region: Secondary | ICD-10-CM | POA: Diagnosis not present

## 2015-04-04 DIAGNOSIS — M545 Low back pain: Secondary | ICD-10-CM | POA: Diagnosis not present

## 2015-04-04 DIAGNOSIS — M791 Myalgia: Secondary | ICD-10-CM | POA: Diagnosis not present

## 2015-04-04 DIAGNOSIS — M9904 Segmental and somatic dysfunction of sacral region: Secondary | ICD-10-CM | POA: Diagnosis not present

## 2015-04-04 DIAGNOSIS — M9903 Segmental and somatic dysfunction of lumbar region: Secondary | ICD-10-CM | POA: Diagnosis not present

## 2015-04-08 DIAGNOSIS — M9904 Segmental and somatic dysfunction of sacral region: Secondary | ICD-10-CM | POA: Diagnosis not present

## 2015-04-08 DIAGNOSIS — M791 Myalgia: Secondary | ICD-10-CM | POA: Diagnosis not present

## 2015-04-08 DIAGNOSIS — M545 Low back pain: Secondary | ICD-10-CM | POA: Diagnosis not present

## 2015-04-08 DIAGNOSIS — M9905 Segmental and somatic dysfunction of pelvic region: Secondary | ICD-10-CM | POA: Diagnosis not present

## 2015-04-08 DIAGNOSIS — M9903 Segmental and somatic dysfunction of lumbar region: Secondary | ICD-10-CM | POA: Diagnosis not present

## 2015-07-16 ENCOUNTER — Telehealth: Payer: Self-pay | Admitting: Pulmonary Disease

## 2015-07-16 NOTE — Telephone Encounter (Signed)
Patient was seeing Dr. Joya Gaskins, just received a letter about Dr. Joya Gaskins leaving.  Patient has seen Dr. Elsworth Soho for sleep related, but has not seen him for pulmonary.  Patients daughter wants patient to switch to Dr. Lake Bells from Dr. Joya Gaskins for pulmonary.  Dr. Lake Bells sees her mom and she wanted him to also see her dad.  Patient needs to be seen for an appointment this month.  Dr. Lake Bells - ok to switch?   Ok to schedule appointment?

## 2015-07-16 NOTE — Telephone Encounter (Signed)
Fine by me 

## 2015-07-16 NOTE — Telephone Encounter (Signed)
Error pt changed there mind

## 2015-07-16 NOTE — Telephone Encounter (Signed)
Patient scheduled to see Dr. Lake Bells on 08/05/15 at 1115.  ,Nothing further needed.

## 2015-08-05 ENCOUNTER — Encounter: Payer: Self-pay | Admitting: Pulmonary Disease

## 2015-08-05 ENCOUNTER — Ambulatory Visit (INDEPENDENT_AMBULATORY_CARE_PROVIDER_SITE_OTHER): Payer: Medicare Other | Admitting: Pulmonary Disease

## 2015-08-05 VITALS — BP 130/72 | HR 62 | Ht 70.0 in | Wt 226.0 lb

## 2015-08-05 DIAGNOSIS — G4733 Obstructive sleep apnea (adult) (pediatric): Secondary | ICD-10-CM

## 2015-08-05 DIAGNOSIS — Z87891 Personal history of nicotine dependence: Secondary | ICD-10-CM | POA: Insufficient documentation

## 2015-08-05 DIAGNOSIS — J432 Centrilobular emphysema: Secondary | ICD-10-CM

## 2015-08-05 DIAGNOSIS — R0602 Shortness of breath: Secondary | ICD-10-CM | POA: Diagnosis not present

## 2015-08-05 MED ORDER — TIOTROPIUM BROMIDE-OLODATEROL 2.5-2.5 MCG/ACT IN AERS: 2.0000 | INHALATION_SPRAY | Freq: Every day | RESPIRATORY_TRACT | Status: DC

## 2015-08-05 NOTE — Progress Notes (Signed)
Subjective:    Patient ID: Steven Newman, male    DOB: 07-11-1941, 74 y.o.   MRN: [id number]  Synopsis: Came back to  pulmonary in 2016 after previously being followed by Dr. Joya Newman and Dr. Elsworth Newman for COPD and obstructive sleep apnea. Has a lengthy smoking history where he smoked as much as 2 packs of cigarettes daily and quit in 2014. Simple spirometry August 2016 showed clear airflow obstruction with an FEV1 of 1.3 L (42% predicted), FVC 1.7 L, 47% predicted  HPI  Chief Complaint  Patient presents with  . Follow-up    former PW pt l/s 09/2013-pt has increased DOE, wheezing, occasional prod cough with clear-brown mucus.     Steven Newman comes to clinic today with his wife. She has prompted him to come in because they recently lost her son from untreated COPD. He tells me that he does not have specific complaints. He notices from time to time that his oxygen level will drop down into the high 80s when he is walking. However, he notes that he does not notice any shortness of breath. His wife states that quite to the contrary he gets short of breath with minimal exertion or if carrying anything with any weight. She notices coughing and wheezing. He does cough up brown sputum from time to time. He does not cough up mucus on a daily basis. He only uses Symbicort on an as-needed basis. Apparently about 6 weeks ago had an episode of bronchitis. He never sought treatment for this. He's never been hospitalized for respiratory problem. He quit smoking in 2014. He says that he did smoke a little bit when he was on vacation in 2016.    Past Medical History  Diagnosis Date  . Hx of colonic polyps   . Hyperlipidemia   . Erectile dysfunction   . Hypertension   . MI (myocardial infarction)     1976, 1985  . CAD (coronary artery disease)     coronary angioplasty 1995  . Obesity   . Pneumonia   . History of bladder cancer   . Skin cancer   . Kidney stones   . Dyspnea   . Wheezing   . Type 2 diabetes  mellitus      Review of Systems  Constitutional: Positive for fatigue. Negative for fever and chills.  HENT: Negative for postnasal drip, rhinorrhea and sinus pressure.   Respiratory: Positive for cough, shortness of breath and wheezing.   Cardiovascular: Negative for chest pain, palpitations and leg swelling.       Objective:   Physical Exam Filed Vitals:   08/05/15 1132  BP: 130/72  Pulse: 62  Height: 5\' 10"  (1.778 m)  Weight: 226 lb (102.513 kg)  SpO2: 93%   room air  And related 500 feet on room air and O2 saturation remained above 97%  Gen: well appearing, no acute distress HENT: NCAT, OP clear, neck supple without masses Eyes: PERRL, EOMi Lymph: no cervical lymphadenopathy PULM: CTA B CV: RRR, no mgr, no JVD GI: BS+, soft, nontender, no hsm Derm: no rash or skin breakdown MSK: normal bulk and tone Neuro: A&Ox4, CN II-XII intact, strength 5/5 in all 4 extremities Psyche: normal mood and affect  CXR images from March 2016 images personally reviewed showing emphysema and chronic bronchitis changes Previous notes from my partners were reviewed were he was treated for obstructive sleep apnea and COPD       Assessment & Plan:  COPD with emphysema [name] has  been prompted by his wife to come back in the clinic today to reestablish care for his COPD. Today he were counts very few symptoms but she states that he has been quite short of breath with minimal activity at home. He has not remained active. Certainly his dyspnea is due in part to his obesity and deconditioning. Today his simple spirometry showed an FEV1 of 42% predicted which needs to be interpreted with caution because the [facility] was low. It's not clear to me if this represents true severe airflow obstruction with air trapping from emphysema or if he has concomitant restrictive lung disease.  He's not really excited about being here and is quite reticent so I don't think that performing a full pulmonary  function test is necessary right now. Further, I don't think he would comply with it. However, we'll try to get a sense of whether or not there is other lung disease when he has his CT scan of his chest.  Plan: Start Stiolto 2 puffs daily Hold Symbicort Flu shot in the fall Stay active Follow-up 2 months  OSA (obstructive sleep apnea) He refuses to use CPAP because he thinks it'll interfere with the quality of his sleep. He was counseled today on the medical benefits of it. For now, we will hold off on referring him back to the sleep doctor as he is really reluctant to be here at all. On the next visit we'll discuss going back seeing our sleep physicians or starting CPAP.  Personal history of tobacco use, presenting hazards to health He smoked as much as 2 packs of cigarettes daily for many years and finally quit in 2014. It sounds as if he smoked greater than 50 years. He was advised at length today to continue to stay away from cigarettes as he stated that he did smoke a little bit when he was on vacation in [place] recently. We also discussed the risks and benefits of lung cancer screening. He will be referred to our lung cancer screening program.    Current outpatient prescriptions:  .  aspirin 81 MG tablet, Take 81 mg by mouth daily., Disp: , Rfl:  .  atorvastatin (LIPITOR) 80 MG tablet, Take 1 tablet (80 mg total) by mouth daily., Disp: 30 tablet, Rfl: 6 .  Cholecalciferol (VITAMIN D) 2000 UNITS tablet, Take 2,000 Units by mouth daily., Disp: , Rfl:  .  Coenzyme Q10 (CO Q-10) 100 MG CAPS, Take 1 capsule by mouth daily., Disp: , Rfl:  .  nadolol (CORGARD) 40 MG tablet, Take 40 mg by mouth daily., Disp: , Rfl:  .  nitroGLYCERIN (NITROSTAT) 0.4 MG SL tablet, Place 0.4 mg under the tongue every 5 (five) minutes as needed for chest pain., Disp: , Rfl:  .  Omega-3 Fatty Acids (RA FISH OIL) 1400 MG CPDR, Take by mouth daily., Disp: , Rfl:  .  PRESCRIPTION MEDICATION, Triflex for arthritis as  needed, Disp: , Rfl:  .  Tiotropium Bromide-Olodaterol (STIOLTO RESPIMAT) 2.5-2.5 MCG/ACT AERS, Inhale 2 puffs into the lungs daily., Disp: 4 g, Rfl: 5

## 2015-08-05 NOTE — Assessment & Plan Note (Signed)
He smoked as much as 2 packs of cigarettes daily for many years and finally quit in 2014. It sounds as if he smoked greater than 50 years. He was advised at length today to continue to stay away from cigarettes as he stated that he did smoke a little bit when he was on vacation in [place] recently. We also discussed the risks and benefits of lung cancer screening. He will be referred to our lung cancer screening program.

## 2015-08-05 NOTE — Assessment & Plan Note (Signed)
He refuses to use CPAP because he thinks it'll interfere with the quality of his sleep. He was counseled today on the medical benefits of it. For now, we will hold off on referring him back to the sleep doctor as he is really reluctant to be here at all. On the next visit we'll discuss going back seeing our sleep physicians or starting CPAP.

## 2015-08-05 NOTE — Patient Instructions (Signed)
Take Stiolto 2 puffs daily no matter how you feel Stay active We'll call you with the results of the lung cancer screenings CT scan We will see you back in 2 months or sooner if needed

## 2015-08-05 NOTE — Assessment & Plan Note (Signed)
[name] has been prompted by his wife to come back in the clinic today to reestablish care for his COPD. Today he were counts very few symptoms but she states that he has been quite short of breath with minimal activity at home. He has not remained active. Certainly his dyspnea is due in part to his obesity and deconditioning. Today his simple spirometry showed an FEV1 of 42% predicted which needs to be interpreted with caution because the [facility] was low. It's not clear to me if this represents true severe airflow obstruction with air trapping from emphysema or if he has concomitant restrictive lung disease.  He's not really excited about being here and is quite reticent so I don't think that performing a full pulmonary function test is necessary right now. Further, I don't think he would comply with it. However, we'll try to get a sense of whether or not there is other lung disease when he has his CT scan of his chest.  Plan: Start Stiolto 2 puffs daily Hold Symbicort Flu shot in the fall Stay active Follow-up 2 months

## 2015-08-07 ENCOUNTER — Telehealth: Payer: Self-pay | Admitting: Pulmonary Disease

## 2015-08-07 NOTE — Telephone Encounter (Signed)
Called and spoke to pt. Informed him of the recs per CY. Pt verbalized understanding and denied any further questions or concerns at this time.

## 2015-08-07 NOTE — Telephone Encounter (Signed)
Per 08/05/15 OV; Patient Instructions       Take Stiolto 2 puffs daily no matter how you feel Stay active We'll call you with the results of the lung cancer screenings CT scan We will see you back in 2 months or sooner if needed  ---  Called spoke with pt. He reports he took the stiolto for the 2nd time last night. This morning he woke up with bottom lip swollen. He reports he doesn't notice any change after he takes the medication and did not have this reaction yesterday morning. He denies any other changes in meds/daily routine. Denies any SOB, no itchy/stratchy throat, no slurred speech. BQ is 3pm elink so will forward to DOD. Please advise Dr. Annamaria Boots thanks  Allergies  Allergen Reactions  . Ace Inhibitors     Angioedema   . Penicillins      Current Outpatient Prescriptions on File Prior to Visit  Medication Sig Dispense Refill  . aspirin 81 MG tablet Take 81 mg by mouth daily.    Marland Kitchen atorvastatin (LIPITOR) 80 MG tablet Take 1 tablet (80 mg total) by mouth daily. 30 tablet 6  . Cholecalciferol (VITAMIN D) 2000 UNITS tablet Take 2,000 Units by mouth daily.    . Coenzyme Q10 (CO Q-10) 100 MG CAPS Take 1 capsule by mouth daily.    . nadolol (CORGARD) 40 MG tablet Take 40 mg by mouth daily.    . nitroGLYCERIN (NITROSTAT) 0.4 MG SL tablet Place 0.4 mg under the tongue every 5 (five) minutes as needed for chest pain.    . Omega-3 Fatty Acids (RA FISH OIL) 1400 MG CPDR Take by mouth daily.    Marland Kitchen PRESCRIPTION MEDICATION Triflex for arthritis as needed    . Tiotropium Bromide-Olodaterol (STIOLTO RESPIMAT) 2.5-2.5 MCG/ACT AERS Inhale 2 puffs into the lungs daily. 4 g 5   No current facility-administered medications on file prior to visit.

## 2015-08-07 NOTE — Telephone Encounter (Signed)
If lip is still swollen, take antihistamine such as allegra, claritin, benadryl daily till swelling is gone.  Don't take Spiriva through weekend.  Consider trying it again next week to see if this happens again.  Call Dr Lake Bells for follow-up advice about next steps or alternatives.

## 2015-08-15 ENCOUNTER — Telehealth: Payer: Self-pay | Admitting: Pulmonary Disease

## 2015-08-15 MED ORDER — TIOTROPIUM BROMIDE MONOHYDRATE 1.25 MCG/ACT IN AERS: 2.0000 | INHALATION_SPRAY | Freq: Every day | RESPIRATORY_TRACT | Status: DC

## 2015-08-15 NOTE — Telephone Encounter (Signed)
Called and spoke with pt's daughter Daughter stated that pt's pharmacy does not have spiriva resp until Monday Daughter is requesting order be routed to [facility] on [address] in [place] Advised daughter that medication would be sent  Order sent to pharmacy  Nothing further is needed

## 2015-08-15 NOTE — Telephone Encounter (Signed)
Spiriva respimat 2 puff daily refill 5

## 2015-08-15 NOTE — Telephone Encounter (Signed)
Called and spoke with spouse Informed to d/c stiolto and begin spiriva resp 2 puff QD Spouse voiced understanding of instructions  Nothing further is needed at this time

## 2015-08-15 NOTE — Telephone Encounter (Signed)
Called spoke with spouse. She reports pt took stiolto on Tuesday. Pt reports he broke out in hives on chest, stomach, arms. He took a Claritin and stopped the stiolto. He has no cleared up since taking the Claritin. Pt has had spiriva and symbicort in the past. Wants alternative to inhaler. Please advise Dr. Lake Bells thanks

## 2015-08-18 ENCOUNTER — Other Ambulatory Visit: Payer: Self-pay | Admitting: Acute Care

## 2015-08-18 ENCOUNTER — Ambulatory Visit (INDEPENDENT_AMBULATORY_CARE_PROVIDER_SITE_OTHER): Payer: Medicare Other | Admitting: Acute Care

## 2015-08-18 ENCOUNTER — Telehealth: Payer: Self-pay | Admitting: Acute Care

## 2015-08-18 ENCOUNTER — Encounter: Payer: Self-pay | Admitting: Acute Care

## 2015-08-18 ENCOUNTER — Ambulatory Visit (HOSPITAL_BASED_OUTPATIENT_CLINIC_OR_DEPARTMENT_OTHER)
Admission: RE | Admit: 2015-08-18 | Discharge: 2015-08-18 | Disposition: A | Discharge status: Home / Self Care | Payer: Medicare Other | Source: Ambulatory Visit | Attending: Acute Care | Admitting: Acute Care

## 2015-08-18 DIAGNOSIS — F17210 Nicotine dependence, cigarettes, uncomplicated: Secondary | ICD-10-CM

## 2015-08-18 DIAGNOSIS — Z122 Encounter for screening for malignant neoplasm of respiratory organs: Secondary | ICD-10-CM | POA: Diagnosis not present

## 2015-08-18 DIAGNOSIS — I2510 Atherosclerotic heart disease of native coronary artery without angina pectoris: Secondary | ICD-10-CM | POA: Diagnosis not present

## 2015-08-18 DIAGNOSIS — Z87891 Personal history of nicotine dependence: Secondary | ICD-10-CM | POA: Diagnosis not present

## 2015-08-18 DIAGNOSIS — R911 Solitary pulmonary nodule: Secondary | ICD-10-CM | POA: Diagnosis not present

## 2015-08-18 IMAGING — CT CT CHEST LUNG CANCER SCREENING LOW DOSE W/O CM
3 of 4 series · 16 of 40 positions shown, 18 images · non-contrast
Comparison: None.

CLINICAL DATA: 73-year-old male with 50 pack-year history of
smoking. Lung cancer screening.

EXAM:
CT CHEST WITHOUT CONTRAST
TECHNIQUE: Multidetector CT imaging of the chest was performed following the
standard protocol without IV contrast.

[Series 2: ldct soft tissues · axial · 0.73mm/px · z∈[+1134,+1369]mm · 8 of 57 slices shown]
[im 5/57  mediastinal]
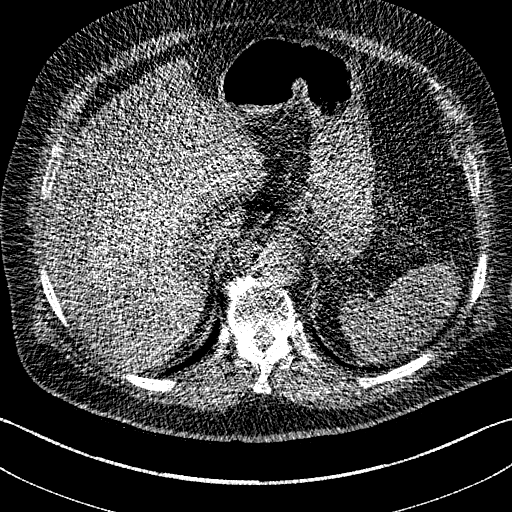
[im 11/57  mediastinal]
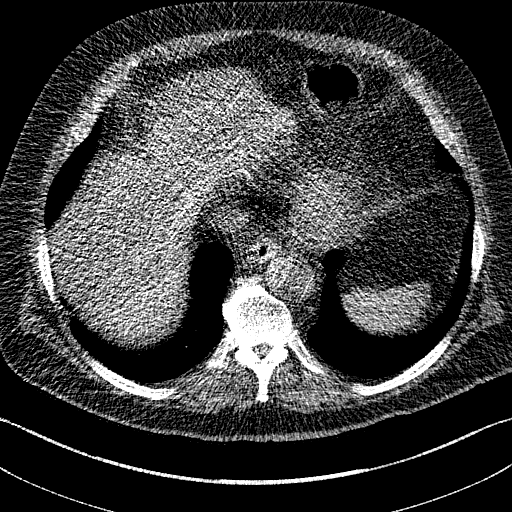
[im 18/57  mediastinal]
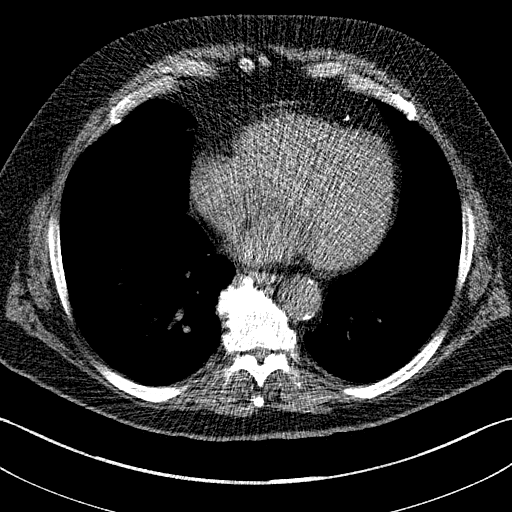
[im 24/57  mediastinal]
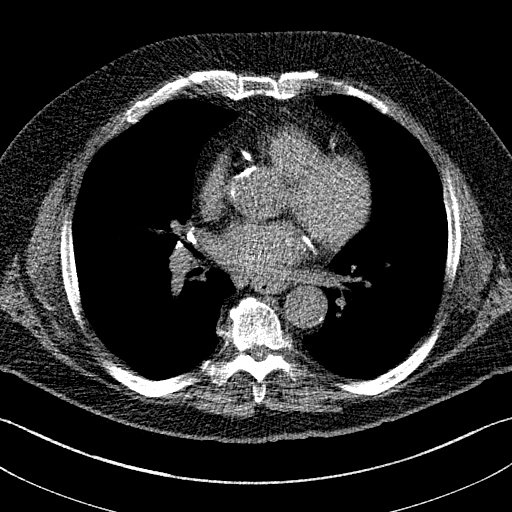
[im 33/57  mediastinal]
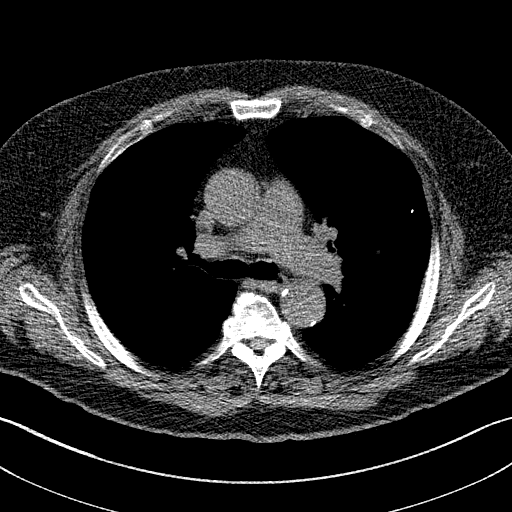
[im 39/57  mediastinal]
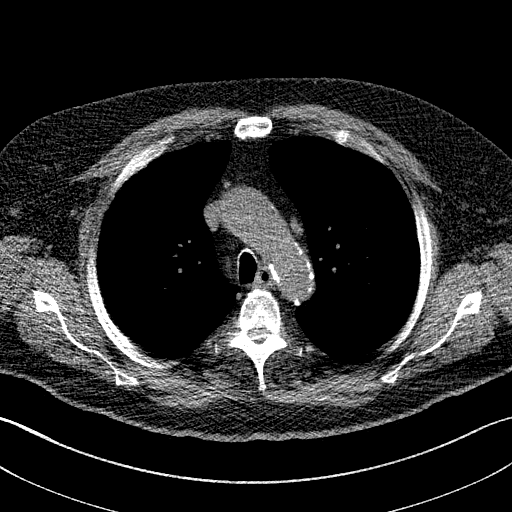
[im 46/57  mediastinal]
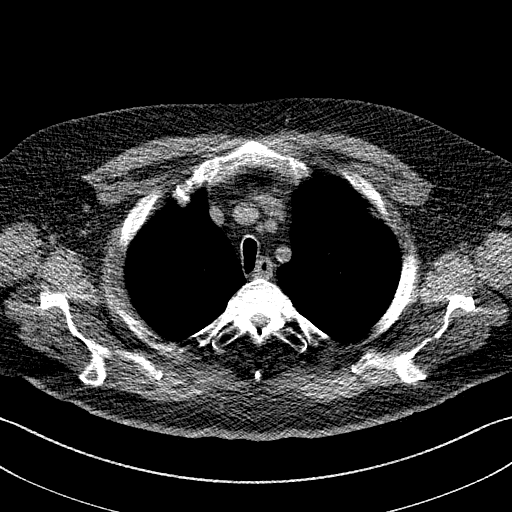
[im 52/57  mediastinal]
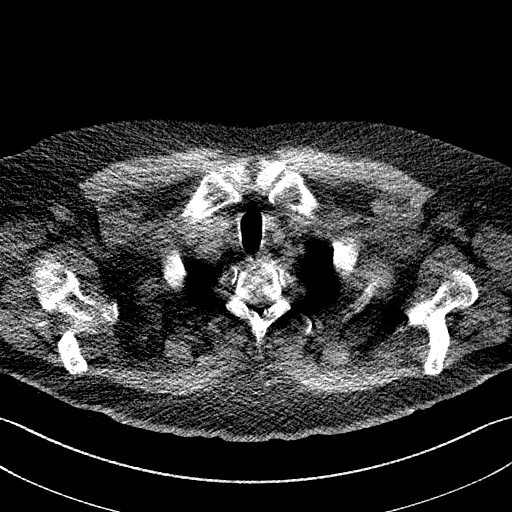

[Series 5: chest 1.0 coronal · coronal · 0.59mm/px · 3 of 295 slices shown]
[im 59/295  lung]
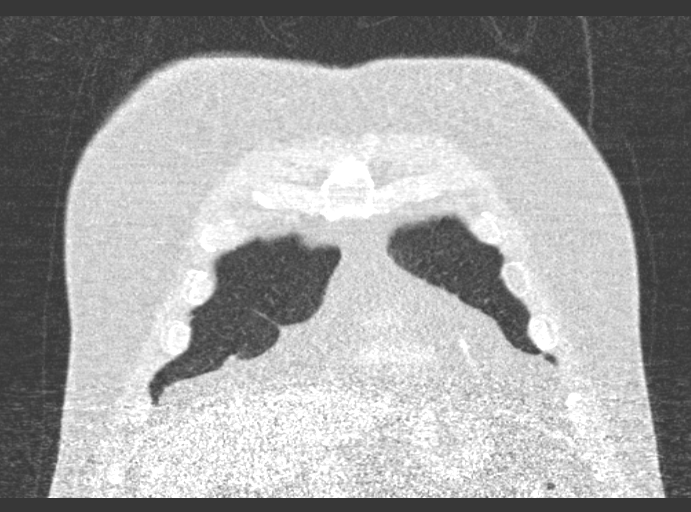
[im 118/295  lung]
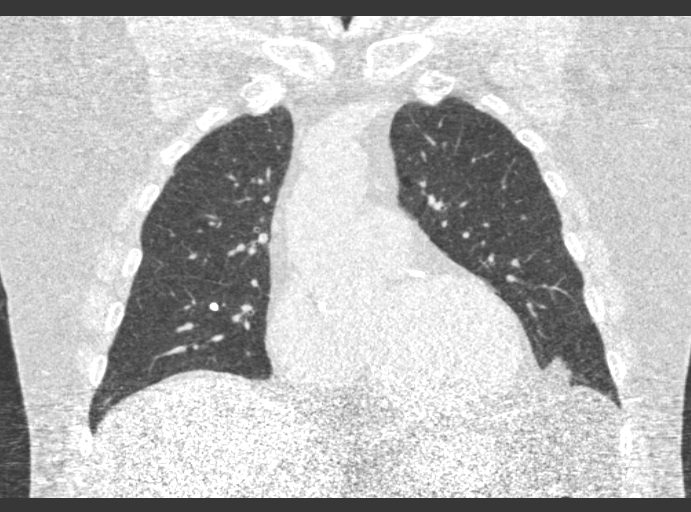
[im 177/295  lung]
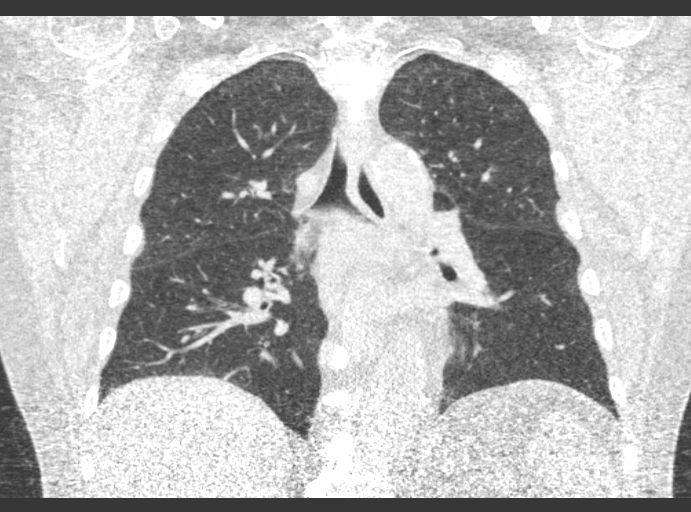

[ct lung segmentation data · axial · 0.73mm/px · z∈[+1113,+1113]mm · 5 of 282 frames shown]
[frame 1/282  mediastinal]
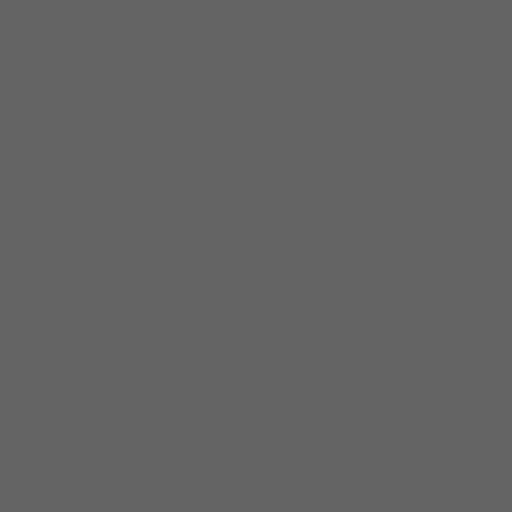
[frame 1/282  lung]
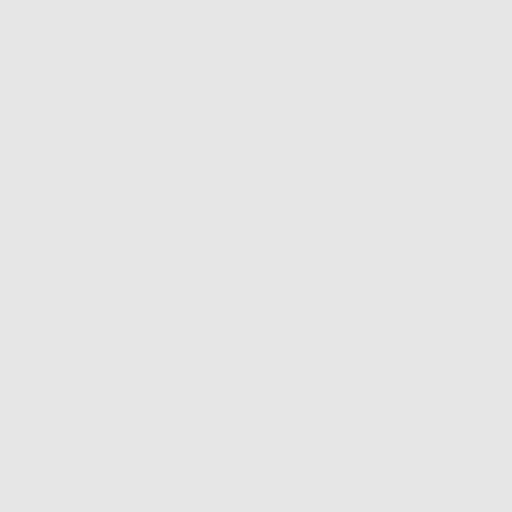
[frame 32/282  lung]
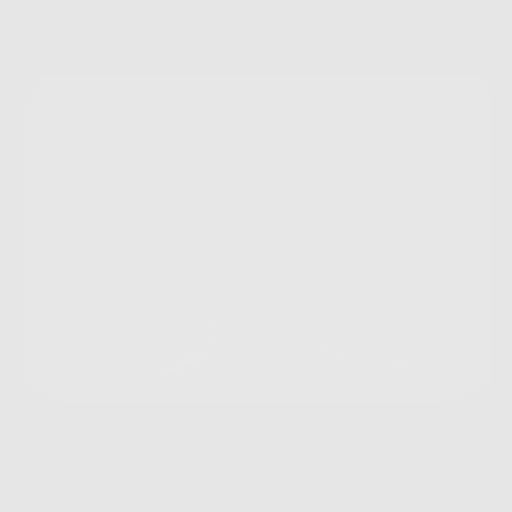
[frame 63/282  lung]
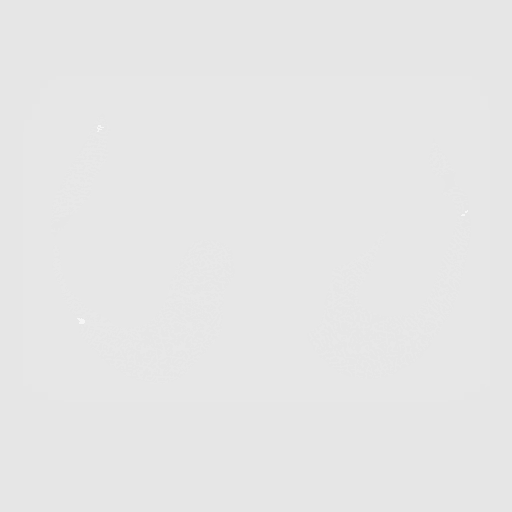
[frame 94/282  lung]
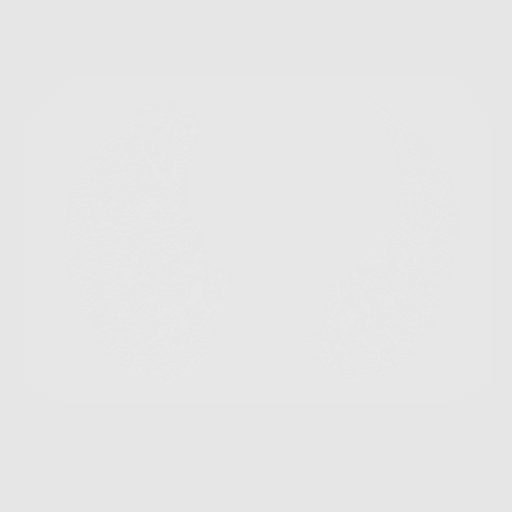
[frame 125/282  mediastinal]
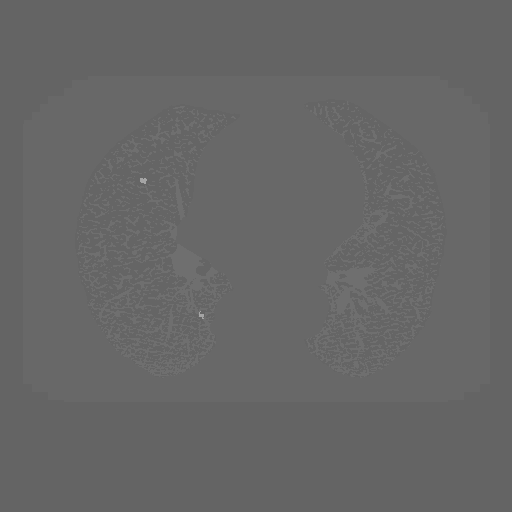
[frame 125/282  lung]
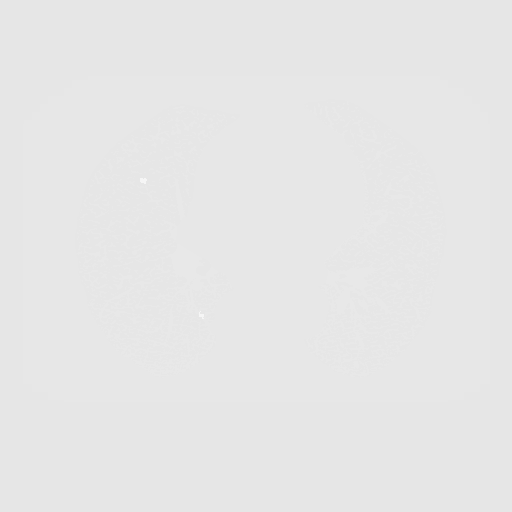

[16 of 40 positions shown; findings below may reference images not displayed]

FINDINGS: Mediastinum / Lymph Nodes: There is no axillary lymphadenopathy. No
mediastinal lymphadenopathy. There is no hilar lymphadenopathy.
Coronary artery calcification is noted. Esophagus is normal in
appearance.

Lungs / Pleura: Scattered bilateral calcified granulomata are
evident. There is a noncalcified perifissural nodule seen along the
minor fissure (image 158 series 3). No focal airspace consolidation.
No pulmonary edema or pleural effusion. Chronic bronchial wall
thickening is evident.

[HOSPITAL] / Soft Tissues: Bone windows reveal no worrisome lytic or
sclerotic osseous lesions.

Upper Abdomen:  Unremarkable.
IMPRESSION: Lung-RADS Category 2, benign appearance or behavior. Continue annual
screening with low-dose chest CT without contrast in 12 months.

Coronary artery atherosclerosis.

## 2015-08-18 NOTE — Telephone Encounter (Signed)
I called to give Steven Newman the results of his scan. I explained that the scan was read as a Lung RADS 2, nodules with a very low likelihood of becoming a clinically active cancer due to size and lack of growth. I explained that the recommendation is for a repeat scan in 12 months. He verbalized understanding and had no further questions. I told him I would call him in 1 year to schedule him for his next screening scan. He verbalized understanding.I also gave his wife the results and offered her the chance to ask questions. She had none, but has my contact information in the event she has questions. I will let Dr. Lake Bells know the results.

## 2015-08-18 NOTE — Progress Notes (Signed)
Shared Decision Making Visit Lung Cancer Screening Program [phone])   Eligibility:  Age 74 y.o.  Pack Years Smoking History Calculation : 100 pack year (# packs/per year x # years smoked)  Recent History of coughing up blood?  no  Unexplained weight loss? no ( >Than 15 pounds within the last 6 months )  Prior History Lung / other cancer No; bladder cancer 2002. Resolved 2002 (Diagnosis within the last 5 years already requiring surveillance chest CT Scans).  Smoking Status Current Smoker  Former Smokers: Years since quit:NA  Quit Date: NA  Visit Components:  Discussion included one or more decision making aids. yes  Discussion included risk/benefits of screening. yes  Discussion included potential follow up diagnostic testing for abnormal scans. yes  Discussion included meaning and risk of over diagnosis. yes  Discussion included meaning and risk of False Positives. yes  Discussion included meaning of total radiation exposure. yes  Counseling Included:  Importance of adherence to annual lung cancer LDCT screening. yes  Impact of comorbidities on ability to participate in the program. yes  Ability and willingness to under diagnostic treatment. yes  Smoking Cessation Counseling:  Current Smokers:   Discussed importance of smoking cessation. yes  Information about tobacco cessation classes and interventions provided to patient. yes  Patient provided with "ticket" for LDCT Scan. yes  Symptomatic Patient. no  Counseling  Diagnosis Code: Tobacco Use Z72.0  Asymptomatic Patient yes  Counseling (Intermediate counseling: > three minutes counseling) [id number]  Former Smokers:   Discussed the importance of maintaining cigarette abstinence. NA  Diagnosis Code: Personal History of Nicotine Dependence. E10.071  Information about tobacco cessation classes and interventions provided to patient. See above  Patient provided with "ticket" for LDCT Scan. yes  Written  Order for Lung Cancer Screening with LDCT placed in Epic. Yes (CT Chest Lung Cancer Screening Low Dose W/O CM) [id number] Z12.2-Screening of respiratory organs Z87.891-Personal history of nicotine dependence  I spent 15 minutes of face to face time with Mr. Sasaki and his wife discussing the risks and benefits of the Lung Screening Program. We viewed a power point together that reviewed all points noted above. We paused at intervals to allow for questions to be asked and answered to allow for full understanding of the concepts discussed. We discussed the fact that the single most powerful action that Mr. Keatts can take to decrease his risk of lung cancer is to quit smoking. He had been down to just a  few cigarettes a day but his son died 4 months ago, and he has started smoking again. We discussed the role of grief counseling to help with the profound sadness both he and his wife are dealing with at the loss of their son. We also discussed the role of Wellbutrin and how that may help with his situation in regard to smoking cessation. He has agreed to reach out to either me or Dr. Lake Bells when he is ready to quit again for some help. We confirmed both the time and location of the scan at [facility]I explained that I will call with the results of the scan either this afternoon or tomorrow. They have my contact number and know they can call me with any questions or concerns.They verbalized understanding of all of the above and had no further questions before leaving the office.  Magdalen Spatz, NP

## 2015-09-10 DIAGNOSIS — I2510 Atherosclerotic heart disease of native coronary artery without angina pectoris: Secondary | ICD-10-CM | POA: Diagnosis not present

## 2015-09-10 DIAGNOSIS — J449 Chronic obstructive pulmonary disease, unspecified: Secondary | ICD-10-CM | POA: Diagnosis not present

## 2015-09-10 DIAGNOSIS — Z23 Encounter for immunization: Secondary | ICD-10-CM | POA: Diagnosis not present

## 2015-09-10 DIAGNOSIS — R809 Proteinuria, unspecified: Secondary | ICD-10-CM | POA: Diagnosis not present

## 2015-09-10 DIAGNOSIS — G4733 Obstructive sleep apnea (adult) (pediatric): Secondary | ICD-10-CM | POA: Diagnosis not present

## 2015-09-10 DIAGNOSIS — I10 Essential (primary) hypertension: Secondary | ICD-10-CM | POA: Diagnosis not present

## 2015-09-10 DIAGNOSIS — I252 Old myocardial infarction: Secondary | ICD-10-CM | POA: Diagnosis not present

## 2015-09-10 DIAGNOSIS — E119 Type 2 diabetes mellitus without complications: Secondary | ICD-10-CM | POA: Diagnosis not present

## 2015-09-10 DIAGNOSIS — Z86010 Personal history of colonic polyps: Secondary | ICD-10-CM | POA: Diagnosis not present

## 2015-09-10 DIAGNOSIS — E7800 Pure hypercholesterolemia, unspecified: Secondary | ICD-10-CM | POA: Diagnosis not present

## 2015-09-10 DIAGNOSIS — Z125 Encounter for screening for malignant neoplasm of prostate: Secondary | ICD-10-CM | POA: Diagnosis not present

## 2015-09-17 DIAGNOSIS — M2550 Pain in unspecified joint: Secondary | ICD-10-CM | POA: Diagnosis not present

## 2015-09-17 DIAGNOSIS — M150 Primary generalized (osteo)arthritis: Secondary | ICD-10-CM | POA: Diagnosis not present

## 2015-09-27 DIAGNOSIS — Z6841 Body Mass Index (BMI) 40.0 and over, adult: Secondary | ICD-10-CM | POA: Diagnosis not present

## 2015-09-27 DIAGNOSIS — J9601 Acute respiratory failure with hypoxia: Secondary | ICD-10-CM | POA: Diagnosis not present

## 2015-09-27 DIAGNOSIS — I2510 Atherosclerotic heart disease of native coronary artery without angina pectoris: Secondary | ICD-10-CM | POA: Diagnosis not present

## 2015-09-27 DIAGNOSIS — J441 Chronic obstructive pulmonary disease with (acute) exacerbation: Secondary | ICD-10-CM | POA: Diagnosis not present

## 2015-09-27 DIAGNOSIS — R05 Cough: Secondary | ICD-10-CM | POA: Diagnosis not present

## 2015-09-27 DIAGNOSIS — R0902 Hypoxemia: Secondary | ICD-10-CM | POA: Diagnosis not present

## 2015-09-27 DIAGNOSIS — E785 Hyperlipidemia, unspecified: Secondary | ICD-10-CM | POA: Diagnosis not present

## 2015-09-27 DIAGNOSIS — J069 Acute upper respiratory infection, unspecified: Secondary | ICD-10-CM | POA: Diagnosis not present

## 2015-09-27 DIAGNOSIS — J449 Chronic obstructive pulmonary disease, unspecified: Secondary | ICD-10-CM | POA: Diagnosis not present

## 2015-09-27 DIAGNOSIS — R0602 Shortness of breath: Secondary | ICD-10-CM | POA: Diagnosis not present

## 2015-09-27 DIAGNOSIS — I10 Essential (primary) hypertension: Secondary | ICD-10-CM | POA: Diagnosis not present

## 2015-09-28 DIAGNOSIS — Z6841 Body Mass Index (BMI) 40.0 and over, adult: Secondary | ICD-10-CM | POA: Diagnosis not present

## 2015-09-28 DIAGNOSIS — I2510 Atherosclerotic heart disease of native coronary artery without angina pectoris: Secondary | ICD-10-CM | POA: Diagnosis not present

## 2015-09-28 DIAGNOSIS — J9601 Acute respiratory failure with hypoxia: Secondary | ICD-10-CM | POA: Diagnosis present

## 2015-09-28 DIAGNOSIS — E785 Hyperlipidemia, unspecified: Secondary | ICD-10-CM | POA: Diagnosis present

## 2015-09-28 DIAGNOSIS — R0902 Hypoxemia: Secondary | ICD-10-CM | POA: Diagnosis not present

## 2015-09-28 DIAGNOSIS — E669 Obesity, unspecified: Secondary | ICD-10-CM | POA: Diagnosis present

## 2015-09-28 DIAGNOSIS — J441 Chronic obstructive pulmonary disease with (acute) exacerbation: Secondary | ICD-10-CM | POA: Diagnosis not present

## 2015-09-28 DIAGNOSIS — J9611 Chronic respiratory failure with hypoxia: Secondary | ICD-10-CM | POA: Diagnosis not present

## 2015-09-28 DIAGNOSIS — I10 Essential (primary) hypertension: Secondary | ICD-10-CM | POA: Diagnosis not present

## 2015-09-29 ENCOUNTER — Telehealth: Payer: Self-pay | Admitting: Pulmonary Disease

## 2015-09-29 NOTE — Telephone Encounter (Signed)
Spoke with pt's wife, states that pt is hospitalized in [place] since Saturday d/t increased sob, wheezing.  Pt has been given breathing treatments in the hospital and is being kept overnight.  States that she has requested a pulmonologist in [place] to round on him but has not yet.  Pt has been told that he will be hospitalized until his 10 sats are stable.    To BQ as FYI.

## 2015-09-30 NOTE — Telephone Encounter (Signed)
It sounds like he will need oxygen when he comes back to town which we can prescribe (if he will use it, last time he really didn't want to even talk to me) Tell her to keep [place] posted on his progress

## 2015-10-02 ENCOUNTER — Telehealth: Payer: Self-pay | Admitting: Pulmonary Disease

## 2015-10-02 NOTE — Telephone Encounter (Signed)
LMTCB

## 2015-10-02 NOTE — Telephone Encounter (Signed)
Called and spoke with [name] with [employer] she states that a prior auth was started by the pt for Atrovent. Claim # [id number] I explained to her that I do not see any records of BQ placing the pt on Atrovent and that I would contact the pt for clarification.   Called and spoke with the pt's wife. She stated that her and the pt were in [place] visiting family and he was hospitalized on 09/27/15 to 10/02/15 due to extreme shortness of breath and wheezing. She stated that the MD in [place] tried using albuterol on him and it made his shortness of breath worse.The MD switched him to Atrovent and it seems to be helping him. She states that they are on the way back from [place] today and they have an appointment with PM on 10/06/15 to discuss the situation. She did initiate the PA and would like to have BQ's input on whether or not the pt should stay on Atrovent. BQ please advise

## 2015-10-02 NOTE — Telephone Encounter (Signed)
Atrovent OK by me

## 2015-10-03 NOTE — Telephone Encounter (Signed)
Patient's wife returning call, may be reached at [phone].

## 2015-10-03 NOTE — Telephone Encounter (Signed)
Spoke with pt's wife, aware that BQ is ok with assuming care of pt's atrovent.   Called [facility], states that atrovent does not need a PA, it needs a tier cost exception.  This is being reviewed and has a 24-72 hour turnaround time.  Decision will be faxed to our office.  Will await decision.  Reference #: [id number]

## 2015-10-06 ENCOUNTER — Ambulatory Visit (INDEPENDENT_AMBULATORY_CARE_PROVIDER_SITE_OTHER): Payer: Medicare Other | Admitting: Pulmonary Disease

## 2015-10-06 ENCOUNTER — Encounter: Payer: Self-pay | Admitting: Pulmonary Disease

## 2015-10-06 VITALS — BP 124/80 | HR 66 | Ht 69.5 in | Wt 226.0 lb

## 2015-10-06 DIAGNOSIS — R0602 Shortness of breath: Secondary | ICD-10-CM | POA: Diagnosis not present

## 2015-10-06 NOTE — Progress Notes (Signed)
Subjective:    Patient ID: Steven Newman, male    DOB: 1941-03-07, 74 y.o.   MRN: [id number]  HPI Acute visit for management of COPD.  Steven Newman is a 74 year old with COPD, ex-smoker. He is a patient of Dr. Lake Bells. He was first seen in the clinic in 08/05/15. Started on Stiolto but developed hives after using it. Inhalers were switched to Spiriva. He was also referred to lung cancer screening program.  He was hospitalized in [place] for 4 days on 110/23 while visiting his children. I do not have all the records but as per his wife the chest x-ray is negative. He was treated with  IV steroids, azithromycin. He was tried on albuterol while inpatient but became more dyspneic and this was switched to Atrovent. He was discharged on a prednisone taper and Atrovent inhaler. He has since finished his prednisone and antibiotics. He feels much better. In the office today does not complain of any dyspnea, wheezing, cough, sputum production, fever, chills. He feels that he is getting closer to his baseline. I'm not sure if a flu test a virus panel was done during the hospitalization. He is up-to-date with his flu vaccination this year.  PFTs (08/05/15) FVC 1.90 (45%) FEV1 1.29 (42%) F/F 68.  Past Medical History  Diagnosis Date  . Hx of colonic polyps   . Hyperlipidemia   . Erectile dysfunction   . Hypertension   . MI (myocardial infarction) ([place])     1976, 1985  . CAD (coronary artery disease)     coronary angioplasty 1995  . Obesity   . Pneumonia   . History of bladder cancer   . Skin cancer   . Kidney stones   . Dyspnea   . Wheezing   . Type 2 diabetes mellitus ([place])     Current outpatient prescriptions:  .  aspirin 81 MG tablet, Take 81 mg by mouth daily., Disp: , Rfl:  .  atorvastatin (LIPITOR) 80 MG tablet, Take 1 tablet (80 mg total) by mouth daily., Disp: 30 tablet, Rfl: 6 .  ATROVENT HFA 17 MCG/ACT inhaler, , Disp: , Rfl:  .  Cholecalciferol (VITAMIN D) 2000 UNITS tablet, Take  2,000 Units by mouth daily., Disp: , Rfl:  .  Coenzyme Q10 (CO Q-10) 100 MG CAPS, Take 1 capsule by mouth daily., Disp: , Rfl:  .  nadolol (CORGARD) 40 MG tablet, Take 40 mg by mouth daily., Disp: , Rfl:  .  nitroGLYCERIN (NITROSTAT) 0.4 MG SL tablet, Place 0.4 mg under the tongue every 5 (five) minutes as needed for chest pain., Disp: , Rfl:  .  Omega-3 Fatty Acids (RA FISH OIL) 1400 MG CPDR, Take by mouth daily., Disp: , Rfl:  .  PRESCRIPTION MEDICATION, Triflex for arthritis as needed, Disp: , Rfl:  .  Tiotropium Bromide Monohydrate (SPIRIVA RESPIMAT) 1.25 MCG/ACT AERS, Inhale 2 puffs into the lungs daily., Disp: 1 Inhaler, Rfl: 5 .  Tiotropium Bromide-Olodaterol (STIOLTO RESPIMAT) 2.5-2.5 MCG/ACT AERS, Inhale 2 puffs into the lungs daily. (Patient not taking: Reported on 10/06/2015), Disp: 4 g, Rfl: 5   Review of Systems Has dyspnea on exertion. Next and denies any cough, sputum production, wheezing, chills, hemoptysis. Denies any chest pain, palpitations. Denies any fevers, chills, malaise, weight loss, fatigue. Denies any nausea, vomiting, diarrhea, constipation. All other review of systems are negative.    Objective:   Physical Exam Blood pressure 124/80, pulse 66, height 5' 9.5" (1.765 m), weight 226 lb (102.513 kg), SpO2 95 %.  Gen.: Pleasant elderly male. No apparent distress  Neuro: No gross focal deficits. Neck: No JVD, lymphadenopathy, thyromegaly. RS: Clear, No wheeze or crackles, noin-laboured breathing. CVS: S1-S2 heard, no murmurs rubs gallops. Abdomen: Soft, positive bowel sounds. Extremities: No edema.    Assessment & Plan:  COPD, acute exacerbation. He is recovering well from his recent hospitalization with exacerbation. He has completed his course of antibiotics and prednisone and is not wheezing in the office today. He is on Atrovent in addition to Spiriva. I'm not sure if he needs any additional anticholinergic but he has not tolerated the albuterol while in the  hospital. I'll continue the Atrovent inhaler and give him a prescription for Atrovent nebulizers to be used as needed.  He is enrolled in the lung cancer screening program. The last CT scan does not show any worrisome findings. He will need full PFTs and a referral for sleep study as per the previous note. This can be assessed after he recovers from this acute episode.  Plan: - Continue spiriva - Continue atrovent inhaler. Prescribe atrovent nebs PRN  Follow up with Dr. Lake Bells.  Marshell Garfinkel MD [facility] Pager [phone] If no answer or after 3pm call: [phone] 10/06/2015, 4:26 PM

## 2015-10-06 NOTE — Patient Instructions (Signed)
Continue using the Spiriva and Atrovent as prescribed. We will give you a prescription for Atrovent nebulizer to be used as needed for shortness of breath.  Follow-up with Dr. Lake Bells.

## 2015-10-07 NOTE — Telephone Encounter (Signed)
Received a fax stating that the Atrovent HFA had been denied.

## 2015-10-07 NOTE — Telephone Encounter (Signed)
I need to talk to someone in person about this in clinic

## 2015-10-07 NOTE — Telephone Encounter (Signed)
Have another stating pt must try and fail the following Tier 3 medications:  Advair Diskus 250-63mcg Anoro Ellipta Breo Ellipta Servent Diskus Spiriva HH    FYI

## 2015-10-07 NOTE — Telephone Encounter (Signed)
I have sent Steven Newman a skype asking for her to send these faxes over. Will also forward to [place] to speak with BQ when he is in clinic this afternoon.

## 2015-10-08 ENCOUNTER — Telehealth: Payer: Self-pay | Admitting: Pulmonary Disease

## 2015-10-08 MED ORDER — ALBUTEROL SULFATE (2.5 MG/3ML) 0.083% IN NEBU: 2.5000 mg | INHALATION_SOLUTION | RESPIRATORY_TRACT | Status: DC | PRN

## 2015-10-08 NOTE — Telephone Encounter (Signed)
Pharmacy needs DOW form signed prior to giving Rx to patient. Form has been given to [place] to have BQ sign and fax back to [employer]. [name] is aware that BQ is back in office tomorrow afternoon and will be taken care of.

## 2015-10-08 NOTE — Telephone Encounter (Signed)
No atrovent, not a good idea to use with Spiriva Make sure he has an Rx for albuterol nebulizer q4h prn dyspnea

## 2015-10-08 NOTE — Telephone Encounter (Signed)
Called and spoke with the pt's wife. I informed her of BQ;s recs. I verified the pharmacy as [facility] [place]. She stated that at the ov with PM, he stated that the pt needs to keep his ov with BQ for 10/15/15. I explained to her that the ov for 10/15/15 at 10:30 am was canceled, but I rescheduled her for 10/15/15 at 9:15 with BQ. She verified that the pt does have a neb machine at home.  She voiced understanding and had no further questions.   Called and spoke with Sharyn Lull at [facility] and informed her that pt is using spirvia and that the prior auth for Atrovent can be canceled. She voiced understanding and had no further questions.   Albuterol nebulizer was sent to pharmacy Nothing further needed. Will sign off on message.

## 2015-10-08 NOTE — Telephone Encounter (Signed)
Spoke with BQ about this yesterday afternoon in clinic, states that pt needs rov with him to discuss options. BQ, pt saw Dr. Vaughan Browner on 10/06/15 with the below recs: Patient Instructions     Continue using the Spiriva and Atrovent as prescribed. We will give you a prescription for Atrovent nebulizer to be used as needed for shortness of breath.  Follow-up with Dr. Lake Bells.   Please advise if pt needs to come in again since being seen on Monday.  Thanks!

## 2015-10-09 NOTE — Telephone Encounter (Signed)
Patient's wife called to check the status.  She is anxious to get this. Call back # [phone]

## 2015-10-09 NOTE — Telephone Encounter (Signed)
Spoke with the pt's spouse  I advised her that BQ is here this pm and we will have him sign the form  Caryl Pina is aware of this and will forward to her to f/u on Thanks

## 2015-10-09 NOTE — Telephone Encounter (Signed)
Spoke with Caryl Pina she states she has the DOW form and will have it signed this afternoon when BQ is in office.

## 2015-10-10 DIAGNOSIS — L821 Other seborrheic keratosis: Secondary | ICD-10-CM | POA: Diagnosis not present

## 2015-10-10 DIAGNOSIS — D225 Melanocytic nevi of trunk: Secondary | ICD-10-CM | POA: Diagnosis not present

## 2015-10-10 DIAGNOSIS — B353 Tinea pedis: Secondary | ICD-10-CM | POA: Diagnosis not present

## 2015-10-10 DIAGNOSIS — D1801 Hemangioma of skin and subcutaneous tissue: Secondary | ICD-10-CM | POA: Diagnosis not present

## 2015-10-10 DIAGNOSIS — Z85828 Personal history of other malignant neoplasm of skin: Secondary | ICD-10-CM | POA: Diagnosis not present

## 2015-10-10 DIAGNOSIS — L570 Actinic keratosis: Secondary | ICD-10-CM | POA: Diagnosis not present

## 2015-10-10 NOTE — Telephone Encounter (Signed)
This form has been signed by BQ and faxed back to [facility].

## 2015-10-10 NOTE — Telephone Encounter (Signed)
Caryl Pina, was this signed?

## 2015-10-13 ENCOUNTER — Telehealth: Payer: Self-pay | Admitting: Pulmonary Disease

## 2015-10-13 NOTE — Telephone Encounter (Signed)
Called and spoke with patient, he says that his mother in law purchased a new neb machine and gave her his old machine in Feb 2016.  Called and advised Tiara at [facility]. Nothing further needed. Closing encounter

## 2015-10-15 ENCOUNTER — Telehealth: Payer: Self-pay | Admitting: Pulmonary Disease

## 2015-10-15 ENCOUNTER — Encounter: Payer: Self-pay | Admitting: Pulmonary Disease

## 2015-10-15 ENCOUNTER — Ambulatory Visit: Payer: PRIVATE HEALTH INSURANCE | Admitting: Pulmonary Disease

## 2015-10-15 ENCOUNTER — Ambulatory Visit (INDEPENDENT_AMBULATORY_CARE_PROVIDER_SITE_OTHER): Payer: Medicare Other | Admitting: Pulmonary Disease

## 2015-10-15 VITALS — BP 144/76 | HR 60 | Ht 70.0 in | Wt 234.0 lb

## 2015-10-15 DIAGNOSIS — J432 Centrilobular emphysema: Secondary | ICD-10-CM

## 2015-10-15 MED ORDER — UMECLIDINIUM-VILANTEROL 62.5-25 MCG/INH IN AEPB: 1.0000 | INHALATION_SPRAY | Freq: Every day | RESPIRATORY_TRACT | Status: DC

## 2015-10-15 NOTE — Progress Notes (Signed)
Subjective:    Patient ID: Steven Newman, male    DOB: March 23, 1941, 74 y.o.   MRN: [id number]  Synopsis: Came back to [facility] in 2016 after previously being followed by Dr. Joya Gaskins and Dr. Elsworth Soho for COPD and obstructive sleep apnea. Has a lengthy smoking history where he smoked as much as 2 packs of cigarettes daily and quit in 2014. Simple spirometry August 2016 showed clear airflow obstruction with an FEV1 of 1.3 L (42% predicted), FVC 1.7 L, 47% predicted  HPI  Chief Complaint  Patient presents with  . Follow-up    pt doing well, started taking albuterol yesterday.  states his 02 sats are staying in mid to upper 90%.    Steven Newman was hospitalized for 5 days in [place] while he was visiting his grandchildren. He had a cold which was mild, but then after arrival his dyspnea progressed and he was wheezing. He was discharged on Atrovent and Spiriva.  He is no longer using the atrovent, it caused dry mouth.   Past Medical History  Diagnosis Date  . Hx of colonic polyps   . Hyperlipidemia   . Erectile dysfunction   . Hypertension   . MI (myocardial infarction) ([place])     1976, 1985  . CAD (coronary artery disease)     coronary angioplasty 1995  . Obesity   . Pneumonia   . History of bladder cancer   . Skin cancer   . Kidney stones   . Dyspnea   . Wheezing   . Type 2 diabetes mellitus ([place])      Review of Systems  Constitutional: Positive for fatigue. Negative for fever and chills.  HENT: Negative for postnasal drip, rhinorrhea and sinus pressure.   Respiratory: Positive for cough, shortness of breath and wheezing.   Cardiovascular: Negative for chest pain, palpitations and leg swelling.       Objective:   Physical Exam Filed Vitals:   10/15/15 0908  BP: 144/76  Pulse: 60  Height: 5\' 10"  (1.778 m)  Weight: 234 lb (106.142 kg)  SpO2: 94%   room air   Gen: chronically ill appearing, but not acutely ill HENT: OP clear, neck supple PULM: CTA B, normal  percussion CV: RRR, no mgr, trace edema GI: BS+, soft, nontender Derm: no cyanosis or rash Psyche: normal mood and affect   Dr. Matilde Bash note from last week reviewed       Assessment & Plan:  COPD with emphysema [facility]) [name] had a severe exacerbation leading to a hospitalization in [place]. He has recovered and his lungs sound fine today. He does have severe COPD and this is a poor prognostic sign. However, he seems to be doing well today. He had a reaction to Stiolto, so we will not use that moving forward. His flu shot is up to date.  Plan: Stop Spriva Start Anoro Stop atrovent (reaction with Anoro/Spiriva) Use albuterol as needed for shortness of breath F/u late December or early January     Current outpatient prescriptions:  .  aspirin 81 MG tablet, Take 81 mg by mouth daily., Disp: , Rfl:  .  atorvastatin (LIPITOR) 80 MG tablet, Take 1 tablet (80 mg total) by mouth daily., Disp: 30 tablet, Rfl: 6 .  Cholecalciferol (VITAMIN D) 2000 UNITS tablet, Take 2,000 Units by mouth daily., Disp: , Rfl:  .  Coenzyme Q10 (CO Q-10) 100 MG CAPS, Take 1 capsule by mouth daily., Disp: , Rfl:  .  nadolol (CORGARD) 40 MG  tablet, Take 40 mg by mouth daily., Disp: , Rfl:  .  nitroGLYCERIN (NITROSTAT) 0.4 MG SL tablet, Place 0.4 mg under the tongue every 5 (five) minutes as needed for chest pain., Disp: , Rfl:  .  Omega-3 Fatty Acids (RA FISH OIL) 1400 MG CPDR, Take by mouth daily., Disp: , Rfl:  .  PRESCRIPTION MEDICATION, Triflex for arthritis as needed, Disp: , Rfl:  .  albuterol (PROVENTIL) (2.5 MG/3ML) 0.083% nebulizer solution, Take 3 mLs (2.5 mg total) by nebulization every 4 (four) hours as needed for shortness of breath. (Patient not taking: Reported on 10/15/2015), Disp: 75 mL, Rfl: 3 .  Umeclidinium-Vilanterol (ANORO ELLIPTA) 62.5-25 MCG/INH AEPB, Inhale 1 puff into the lungs daily., Disp: 30 each, Rfl: 5

## 2015-10-15 NOTE — Telephone Encounter (Signed)
Rec'd from [facility] forward 26 pages to Dr. Lake Bells

## 2015-10-15 NOTE — Assessment & Plan Note (Signed)
Steven Newman had a severe exacerbation leading to a hospitalization in [place]. He has recovered and his lungs sound fine today. He does have severe COPD and this is a poor prognostic sign. However, he seems to be doing well today. He had a reaction to Stiolto, so we will not use that moving forward. His flu shot is up to date.  Plan: Stop Spriva Start Anoro Stop atrovent (reaction with Anoro/Spiriva) Use albuterol as needed for shortness of breath F/u late December or early January

## 2015-10-15 NOTE — Patient Instructions (Signed)
Stop taking Spiriva Use Anoro 1 puff daily Use albuterol as needed for shortness of breath, wheezing, chest tightness We will see you back in late December or early January

## 2015-10-16 ENCOUNTER — Other Ambulatory Visit: Payer: Self-pay

## 2015-10-17 ENCOUNTER — Telehealth (HOSPITAL_COMMUNITY): Payer: Self-pay

## 2015-10-17 ENCOUNTER — Telehealth: Payer: Self-pay | Admitting: Pulmonary Disease

## 2015-10-17 NOTE — Telephone Encounter (Signed)
Called patient to discuss Pulmonary Rehab referral from Dr. Chase Caller. Spoke with patient's wife. Patient's wife states that [facility] wants to begin rehab at the first of the year. Will follow up with patient at the time.

## 2015-10-17 NOTE — Telephone Encounter (Signed)
Received call from [facility], she sent form for [place] to sign ASAP.  Dr. Lake Bells is not here today, had Dr. Chase Caller (DOD) to sign form on his behalf. Called Molly to notify her of above and let her know above and that form has been faxed back to her. Left detailed message for Cloyde Reams, advised her to call me back if she didn't get the form Nothing further needed. Closing encounter

## 2015-12-17 DIAGNOSIS — R809 Proteinuria, unspecified: Secondary | ICD-10-CM | POA: Diagnosis not present

## 2015-12-17 DIAGNOSIS — E119 Type 2 diabetes mellitus without complications: Secondary | ICD-10-CM | POA: Diagnosis not present

## 2015-12-17 DIAGNOSIS — I10 Essential (primary) hypertension: Secondary | ICD-10-CM | POA: Diagnosis not present

## 2015-12-19 ENCOUNTER — Encounter (HOSPITAL_COMMUNITY): Payer: Self-pay

## 2015-12-19 ENCOUNTER — Encounter (HOSPITAL_COMMUNITY)
Admission: RE | Admit: 2015-12-19 | Discharge: 2015-12-19 | Disposition: A | Discharge status: Home / Self Care | Payer: Medicare Other | Source: Ambulatory Visit | Attending: Pulmonary Disease | Admitting: Pulmonary Disease

## 2015-12-19 VITALS — BP 183/82 | HR 63 | Resp 18 | Ht 69.25 in | Wt 245.6 lb

## 2015-12-19 DIAGNOSIS — J439 Emphysema, unspecified: Secondary | ICD-10-CM | POA: Insufficient documentation

## 2015-12-19 DIAGNOSIS — J438 Other emphysema: Secondary | ICD-10-CM

## 2015-12-19 NOTE — Progress Notes (Signed)
Steven Newman 75 y.o. male Pulmonary Rehab Orientation Note Patient arrived today in Cardiac and Pulmonary Rehab for orientation to Pulmonary Rehab. He ambulated from [place] [facility] accompanied by his wife. He complains of only mild shortness of breath from the walk. He has not been prescribed portable oxygen or oxygen for home use. He has been diagnosed with OSA, but rejected the use of the CPAP. He was encouraged during this appointment to reconsider as better fitting mask have been designed since his sleep study. He was also educated on the health benefits of treating his OSA. Verbalized understanding. Color good, skin warm and dry. Patient is oriented to time and place. Patient's medical history, psychosocial health, and medications reviewed. Psychosocial assessment reveals pt lives with their spouse. Pt is currently unemployed, disabled. Pt hobbies include woodworking, golf, and crossword puzzles, however he has not been able to play golf or do woodworking in the past couple of years because of his deconditioning and the loss of close loved ones. He most recently lost his son 8 months ago to a hemorraghic stroke. He and his wife are still morning the loss of their child. Pt reports his stress level is moderate because of this loss. Areas of stress/anxiety also include Health.  Pt does not exhibit or describe signs of depression. PHQ2/9 score 0/NA. Pt shows good  coping skills with positive outlook . He was offered emotional support and reassurance. Will continue to monitor and evaluate progress toward psychosocial goal(s) of continuing to positively manage his grief and stress. Physical assessment reveals heart rate is normal, S1S2 present. Brreath sounds clear to auscultation, no wheezes, rales, or rhonchi. Grip strength equal, strong. Distal pulses palpable. No edema noted. Patient reports he does take medications as prescribed. Patient states he follows a Regular diet, although he should be following a  diabetic, cardiac diet. The patient reports no specific efforts to gain or lose weight. he has gained a significant amount of weight since his sons death. He blames this on a sedentary lifestyle and too much TV watching. Patient's weight will be monitored closely. Demonstration and practice of PLB using pulse oximeter. Patient able to return demonstration satisfactorily. Safety and hand hygiene in the exercise area reviewed with patient. Patient voices understanding of the information reviewed. Department expectations discussed with patient and achievable goals were set. The patient shows enthusiasm about attending the program and we look forward to working with this nice gentleman. The patient is scheduled for a 6 min walk test on Tuesday 1/17 at 3:30 and to begin exercise on Thursday 1/19 in the 10:30 class.   45 minutes was spent on a variety of activities such as assessment of the patient, obtaining baseline data including height, weight, BMI, and grip strength, verifying medical history, allergies, and current medications, and teaching patient strategies for performing tasks with less respiratory effort with emphasis on pursed lip breathing.

## 2015-12-22 ENCOUNTER — Ambulatory Visit (INDEPENDENT_AMBULATORY_CARE_PROVIDER_SITE_OTHER): Payer: Medicare Other | Admitting: Pulmonary Disease

## 2015-12-22 ENCOUNTER — Encounter: Payer: Self-pay | Admitting: Pulmonary Disease

## 2015-12-22 VITALS — BP 146/78 | HR 59 | Ht 70.0 in | Wt 245.0 lb

## 2015-12-22 DIAGNOSIS — J432 Centrilobular emphysema: Secondary | ICD-10-CM

## 2015-12-22 DIAGNOSIS — Z87891 Personal history of nicotine dependence: Secondary | ICD-10-CM | POA: Diagnosis not present

## 2015-12-22 NOTE — Patient Instructions (Signed)
Keep taking the Anoro daily  Keep the appointment for pulmonary rehab We will see you back in 6 months or sooner if needed

## 2015-12-22 NOTE — Progress Notes (Signed)
Subjective:    Patient ID: Steven Newman, male    DOB: [birth year], 75 y.o.   MRN: [id number]  Synopsis: Came back to [facility] in 2016 after previously being followed by Dr. Joya Gaskins and Dr. Elsworth Soho for COPD and obstructive sleep apnea. Has a lengthy smoking history where he smoked as much as 2 packs of cigarettes daily and quit in 2014.  He started back smoking briefly but he quit again in July 2016. Simple spirometry August 2016 showed clear airflow obstruction with an FEV1 of 1.3 L (42% predicted), FVC 1.7 L, 47% predicted  HPI  Chief Complaint  Patient presents with  . Follow-up    pt states he is doing well-no complaints today. Starts pulmonary rehab tomorrow.    Steven Newman has been doing OK.  He says taht he starts rehab tomorrow.  He is set up to do the first full assessment tomorrow.  He hasn't had to use the albuterol nebulizer at all because his breathing has been better.  No flare ups since the last visit.  He saw his PCP last week after his yearly visit with the [place].  His BP was up and his kidney function wasn't great.   Past Medical History  Diagnosis Date  . Hx of colonic polyps   . Hyperlipidemia   . Erectile dysfunction   . Hypertension   . MI (myocardial infarction) ([place])     1976, 1985  . CAD (coronary artery disease)     coronary angioplasty 1995  . Obesity   . Pneumonia   . History of bladder cancer   . Skin cancer   . Kidney stones   . Dyspnea   . Wheezing   . Type 2 diabetes mellitus ([place])      Review of Systems  Constitutional: Positive for fatigue. Negative for fever and chills.  HENT: Negative for postnasal drip, rhinorrhea and sinus pressure.   Respiratory: Negative for cough, shortness of breath and wheezing.   Cardiovascular: Negative for chest pain, palpitations and leg swelling.       Objective:   Physical Exam Filed Vitals:   12/22/15 1005  BP: 146/78  Pulse: 59  Height: 5\' 10"  (1.778 m)  Weight: 111.131 kg (245 lb)  SpO2: 95%   room  air   Gen: well appearing today HENT: OP clear, neck supple PULM: CTA B, normal percussion CV: RRR, no mgr, trace edema GI: BS+, soft, nontender Derm: no cyanosis or rash Psyche: normal mood and affect        Assessment & Plan:  COPD with emphysema ([place]) Thi has been a stable interval for [place].  He has been tolerating the Anoro well without difficulty.  He is to start pulmonary rehab tomorrow.    His vaccines are up to date.  Plan: Start pulmonary rehab tomorrow  Continue Anoro F/u 6 months  Personal history of tobacco use, presenting hazards to health He has quit smoking as of 06/2015.  I congratulated him.  Continue f/u with LDCT screening program.      Current outpatient prescriptions:  .  albuterol (PROVENTIL) (2.5 MG/3ML) 0.083% nebulizer solution, Take 3 mLs (2.5 mg total) by nebulization every 4 (four) hours as needed for shortness of breath., Disp: 75 mL, Rfl: 3 .  aspirin 81 MG tablet, Take 81 mg by mouth daily., Disp: , Rfl:  .  atorvastatin (LIPITOR) 80 MG tablet, Take 1 tablet (80 mg total) by mouth daily., Disp: 30 tablet, Rfl: 6 .  Cholecalciferol (VITAMIN  D) 2000 UNITS tablet, Take 2,000 Units by mouth daily., Disp: , Rfl:  .  Coenzyme Q10 (CO Q-10) 100 MG CAPS, Take 1 capsule by mouth daily., Disp: , Rfl:  .  nadolol (CORGARD) 40 MG tablet, Take 40 mg by mouth daily., Disp: , Rfl:  .  nitroGLYCERIN (NITROSTAT) 0.4 MG SL tablet, Place 0.4 mg under the tongue every 5 (five) minutes as needed for chest pain., Disp: , Rfl:  .  Omega-3 Fatty Acids (RA FISH OIL) 1400 MG CPDR, Take by mouth daily., Disp: , Rfl:  .  PRESCRIPTION MEDICATION, Triflex for arthritis as needed, Disp: , Rfl:  .  Umeclidinium-Vilanterol (ANORO ELLIPTA) 62.5-25 MCG/INH AEPB, Inhale 1 puff into the lungs daily., Disp: 30 each, Rfl: 5

## 2015-12-22 NOTE — Assessment & Plan Note (Signed)
Thi has been a stable interval for Steven Newman.  He has been tolerating the Anoro well without difficulty.  He is to start pulmonary rehab tomorrow.    His vaccines are up to date.  Plan: Start pulmonary rehab tomorrow  Continue Anoro F/u 6 months

## 2015-12-22 NOTE — Assessment & Plan Note (Signed)
He has quit smoking as of 06/2015.  I congratulated him.  Continue f/u with LDCT screening program.

## 2015-12-23 ENCOUNTER — Encounter (HOSPITAL_COMMUNITY)
Admission: RE | Admit: 2015-12-23 | Discharge: 2015-12-23 | Disposition: A | Discharge status: Home / Self Care | Payer: Medicare Other | Source: Ambulatory Visit | Attending: Pulmonary Disease | Admitting: Pulmonary Disease

## 2015-12-23 ENCOUNTER — Ambulatory Visit (HOSPITAL_COMMUNITY): Payer: Medicare Other

## 2015-12-23 DIAGNOSIS — J439 Emphysema, unspecified: Secondary | ICD-10-CM | POA: Diagnosis not present

## 2015-12-25 ENCOUNTER — Encounter (HOSPITAL_COMMUNITY): Payer: Medicare Other

## 2015-12-30 ENCOUNTER — Encounter (HOSPITAL_COMMUNITY)
Admission: RE | Admit: 2015-12-30 | Discharge: 2015-12-30 | Disposition: A | Discharge status: Home / Self Care | Payer: Medicare Other | Source: Ambulatory Visit | Attending: Pulmonary Disease | Admitting: Pulmonary Disease

## 2015-12-30 DIAGNOSIS — J439 Emphysema, unspecified: Secondary | ICD-10-CM | POA: Diagnosis not present

## 2015-12-30 NOTE — Progress Notes (Signed)
Today, [place] exercised at [employer]. [facility]. Service time was from 1030 to 1200.  The patient exercised by performing aerobic, strengthening, and stretching exercises. Oxygen saturation, heart rate, blood pressure, rate of perceived exertion, and shortness of breath were all monitored before, during, and after exercise. Steven Newman presented with no problems at today's exercise session.  The patient did have a decrease in workload intensity during today's exercise session. He was removed from the upright bike for bp and placed on the walking track.  Pre-exercise vitals: . Weight kg: 111.9 . Liters of O2: ra . SpO2: 96 . HR: 69 . BP: 148/80 . CBG: 138  Exercise vitals: . Highest heartrate:  85 . Lowest oxygen saturation: 94 . Highest blood pressure: 186/96, reck 174/88 . Liters of 02: ra  Post-exercise vitals: . SpO2: 95 . HR: 72 . BP: 114/70 . Liters of O2: ra . CBG: na  Dr. Rush Farmer, Medical Director Dr. Marily Memos is immediately available during today's Pulmonary Rehab session for Andric Hotz Schepers on 12/30/2015 at 1030 class time

## 2016-01-01 ENCOUNTER — Encounter (HOSPITAL_COMMUNITY)
Admission: RE | Admit: 2016-01-01 | Discharge: 2016-01-01 | Disposition: A | Discharge status: Home / Self Care | Payer: Medicare Other | Source: Ambulatory Visit | Attending: Pulmonary Disease | Admitting: Pulmonary Disease

## 2016-01-01 DIAGNOSIS — J439 Emphysema, unspecified: Secondary | ICD-10-CM | POA: Diagnosis not present

## 2016-01-01 NOTE — Progress Notes (Signed)
Today, Tivis exercised at [employer]. Service time was from 1030 to 1230.  The patient exercised by performing aerobic, strengthening, and stretching exercises. Oxygen saturation, heart rate, blood pressure, rate of perceived exertion, and shortness of breath were all monitored before, during, and after exercise. Steven Newman presented with no problems at today's exercise session.  Patient attended the Oxygen Safety class today.  The patient did not have an increase in workload intensity during today's exercise session.  Pre-exercise vitals: . Weight kg: 111.2 . Liters of O2: ra . SpO2: 93 . HR: 62 . BP: 138/80 . CBG: 120  Exercise vitals: . Highest heartrate:  71 . Lowest oxygen saturation: 94 . Highest blood pressure: 140/80 . Liters of 02: ra  Post-exercise vitals: . SpO2: 96 . HR: 63 . BP: 136/78 . Liters of O2: ra . CBG: 122 Dr. Rush Farmer, Medical Director Dr. Sheran Fava is immediately available during today's Pulmonary Rehab session for [name] on 01/01/2016  at 1030 class time.  Marland Kitchen

## 2016-01-06 ENCOUNTER — Encounter (HOSPITAL_COMMUNITY)
Admission: RE | Admit: 2016-01-06 | Discharge: 2016-01-06 | Disposition: A | Discharge status: Home / Self Care | Payer: Medicare Other | Source: Ambulatory Visit | Attending: Pulmonary Disease | Admitting: Pulmonary Disease

## 2016-01-06 DIAGNOSIS — J439 Emphysema, unspecified: Secondary | ICD-10-CM | POA: Diagnosis not present

## 2016-01-06 NOTE — Progress Notes (Signed)
I have reviewed a Home Exercise Prescription with [name] . Colwyn is currently exercising at home on the treadmill.  The patient was advised to walk 2-3 days a week for 30-45 minutes.  Carloyn Manner and I discussed how to progress their exercise prescription.  The patient stated that their goals were to lose weight and improve overall shortness of breath.  He said the PLB was the biggest thing that helped him last time, and hopes to carry it forward again this time.  The patient stated that they understand the exercise prescription.  We reviewed exercise guidelines, target heart rate during exercise, oxygen use, weather, home pulse oximeter, endpoints for exercise, and goals.  Patient is encouraged to come to me with any questions. I will continue to follow up with the patient to assist them with progression and safety.   Alberteen Sam, MA, ACSM RCEP [phone]

## 2016-01-06 NOTE — Progress Notes (Signed)
Today, Steven Newman exercised at [employer]. [facility]. Service time was from 1030 to 1200.  The patient exercised by performing aerobic, strengthening, and stretching exercises. Oxygen saturation, heart rate, blood pressure, rate of perceived exertion, and shortness of breath were all monitored before, during, and after exercise. Steven Newman presented with no problems at today's exercise session.  The patient did not have an increase in workload intensity during today's exercise session.  Pre-exercise vitals: . Weight kg: 110.5 . Liters of O2: ra . SpO2: 93 . HR: 76 . BP: 140/70 . CBG: 142  Exercise vitals: . Highest heartrate:  102 . Lowest oxygen saturation: 94 . Highest blood pressure: 166/80 . Liters of 02: ra  Post-exercise vitals: . SpO2: 95 . HR: 75 . BP: 122/70 . Liters of O2: ra . CBG: 58  Dr. Rush Farmer, Medical Director Dr. Marily Memos is immediately available during today's Pulmonary Rehab session for Steven Newman on 01/06/2016 at 1030 class time

## 2016-01-07 DIAGNOSIS — Z961 Presence of intraocular lens: Secondary | ICD-10-CM | POA: Diagnosis not present

## 2016-01-07 DIAGNOSIS — H43393 Other vitreous opacities, bilateral: Secondary | ICD-10-CM | POA: Diagnosis not present

## 2016-01-08 ENCOUNTER — Encounter (HOSPITAL_COMMUNITY)
Admission: RE | Admit: 2016-01-08 | Discharge: 2016-01-08 | Disposition: A | Discharge status: Home / Self Care | Payer: Medicare Other | Source: Ambulatory Visit | Attending: Pulmonary Disease | Admitting: Pulmonary Disease

## 2016-01-08 DIAGNOSIS — J439 Emphysema, unspecified: Secondary | ICD-10-CM | POA: Diagnosis not present

## 2016-01-08 NOTE — Progress Notes (Signed)
Today, [place] exercised at [employer]. Service time was from 1030 to 1220.  The patient exercised by performing aerobic, strengthening, and stretching exercises. Oxygen saturation, heart rate, blood pressure, rate of perceived exertion, and shortness of breath were all monitored before, during, and after exercise. [name] presented with no problems at today's exercise session.  Patient attended the Diaphragmatic , purse lip breathing and pulmonary meds class today.  The patient did not have an increase in workload intensity during today's exercise session.  Pre-exercise vitals: . Weight kg: 109.9 . Liters of O2: ra . SpO2: 96 . HR: 66 . BP: 134/70 . CBG: 153  Exercise vitals: . Highest heartrate:  73 . Lowest oxygen saturation: 94 . Highest blood pressure: 156/70 . Liters of 02: ra  Post-exercise vitals: . SpO2: 95 . HR: 65 . BP: 118/64 . Liters of O2: ra . CBG: 95 Dr. Rush Farmer, Medical Director Dr. Jerilee Hoh is immediately available during today's Pulmonary Rehab session for Steven Newman on 01/08/2016  at 1030 class time.  Marland Kitchen

## 2016-01-08 NOTE — Progress Notes (Signed)
Steven Newman 75 y.o. male Nutrition Note Spoke with pt. Pt is obese. Making healthy food choices the majority of the time. Pt's Rate Your Plate results reviewed with pt. Pt tries to avoid most salty food. Pt uses low sodium or no added salt canned food if canned food is eaten and rinses canned foods well. Pt adds salt to food occasionally. The role of sodium in lung disease reviewed with pt. Pt is a diet-controlled diabetic.  Pt checks CBG's once a day.  Fasting CBG's reportedly 135 -156 mg/dL. Pt does not recall what his last A1c was. Pt wt is up 11 lb over the past 2 months. Pt reports wt gain due to eating out more often with his mother-in-law being sick and a recent death in the family. Pt states he wants to lose wt but has had several barriers to his wt loss effort including the death of his son and brother-in-law and his mother-in-laws illness. Pt is in the contemplation state of change. Pt expressed understanding of the information reviewed.  No results found for: HGBA1C  Nutrition Diagnosis ? Food-and nutrition-related knowledge deficit related to lack of exposure to information as related to diagnosis of pulmonary disease ? Obesity related to excessive energy intake as evidenced by a BMI of 36.0 ?  Nutrition Rx/Est. Daily Nutrition Needs for: ? wt loss 1600-2100 Kcal  90-110 gm protein   1500 mg or less sodium    Nutrition Intervention ? Pt's individual nutrition plan and goals reviewed with pt. ? Benefits of adopting healthy eating habits discussed when pt's Rate Your Plate reviewed. ? Pt to attend the Nutrition and Lung Disease class ? Continual client-centered nutrition education by RD, as part of interdisciplinary care. Goal(s) 1. Identify food quantities necessary to achieve wt loss of  -2# per week to a goal wt loss of 2.7-10.9 kg (6-24 lb) at graduation from pulmonary rehab. 2. CBG's in the normal range or as close to normal as is safely possible. Monitor and Evaluate progress  toward nutrition goal with team.   Derek Mound, M.Ed, RD, LDN, CDE 01/08/2016 11:51 AM

## 2016-01-13 ENCOUNTER — Encounter (HOSPITAL_COMMUNITY): Payer: Medicare Other

## 2016-01-15 ENCOUNTER — Encounter (HOSPITAL_COMMUNITY)
Admission: RE | Admit: 2016-01-15 | Discharge: 2016-01-15 | Disposition: A | Discharge status: Home / Self Care | Payer: Medicare Other | Source: Ambulatory Visit | Attending: Pulmonary Disease | Admitting: Pulmonary Disease

## 2016-01-15 DIAGNOSIS — J439 Emphysema, unspecified: Secondary | ICD-10-CM | POA: Diagnosis not present

## 2016-01-15 NOTE — Progress Notes (Addendum)
Today, Steven Newman exercised at [employer] [facility]. Service time was from 1030 to 1255.  The patient exercised by performing aerobic, strengthening, and stretching exercises. Oxygen saturation, heart rate, blood pressure, rate of perceived exertion, and shortness of breath were all monitored before, during, and after exercise. Steven Newman presented with no problems at today's exercise session. Steven Newman also attended an education session with [place] on oxygen equipment.  The patient did not have an increase in workload intensity during today's exercise session.  Pre-exercise vitals: . Weight kg: 109.3 . Liters of O2: ra . SpO2: 94 . HR: 66 . BP: 138/80 . CBG: 149  Exercise vitals: . Highest heartrate:  74 . Lowest oxygen saturation: 94 . Highest blood pressure: 130/70 . Liters of 02: ra  Post-exercise vitals: . SpO2: 98 . HR: 67 . BP: 128/70 . Liters of O2: ra . CBG: 83, increased to 132 with gatorade  Dr. Rush Farmer, Medical Director Dr. Waldron Labs is immediately available during today's Pulmonary Rehab session for Steven Newman on 01/15/2016 at 1030 class time.

## 2016-01-20 ENCOUNTER — Encounter (HOSPITAL_COMMUNITY)
Admission: RE | Admit: 2016-01-20 | Discharge: 2016-01-20 | Disposition: A | Discharge status: Home / Self Care | Payer: Medicare Other | Source: Ambulatory Visit | Attending: Pulmonary Disease | Admitting: Pulmonary Disease

## 2016-01-20 DIAGNOSIS — J439 Emphysema, unspecified: Secondary | ICD-10-CM | POA: Diagnosis not present

## 2016-01-20 NOTE — Progress Notes (Addendum)
Today, Steven Newman exercised at [employer]. Service time was from 1030 to 1215.  The patient exercised by performing aerobic, strengthening, and stretching exercises. Oxygen saturation, heart rate, blood pressure, rate of perceived exertion, and shortness of breath were all monitored before, during, and after exercise. Steven Newman did complain of bilateral leg/groin pain at today's exercise session. R leg 4/10, L leg 7/10. This is a chronic problem but intensified at his last exercise session while using the NuStep. He did not use the NuStep today.  The patient did not have an increase in workload intensity during today's exercise session.  Pre-exercise vitals: . Weight kg: 110.5 . Liters of O2: ra . SpO2: 94 . HR: 65 . BP: 116/70 . CBG: 137  Exercise vitals: . Highest heartrate:  73 . Lowest oxygen saturation: 94 . Highest blood pressure: 142/78 . Liters of 02: ra  Post-exercise vitals: . SpO2: 96 . HR: 66 . BP: 118/68 . Liters of O2: ra . CBG: 96, snack eaten before returning home  Dr. Rush Farmer, Medical Director Dr. Marily Memos is immediately available during today's Pulmonary Rehab session for Steven Newman on 01/20/2016 at 1030 class time

## 2016-01-22 ENCOUNTER — Encounter (HOSPITAL_COMMUNITY)
Admission: RE | Admit: 2016-01-22 | Discharge: 2016-01-22 | Disposition: A | Discharge status: Home / Self Care | Payer: Medicare Other | Source: Ambulatory Visit | Attending: Pulmonary Disease | Admitting: Pulmonary Disease

## 2016-01-22 DIAGNOSIS — J439 Emphysema, unspecified: Secondary | ICD-10-CM | POA: Diagnosis not present

## 2016-01-22 NOTE — Progress Notes (Signed)
Today, [place] exercised at [employer]. Service time was from 1030 to 1200.  The patient exercised by performing aerobic, strengthening, and stretching exercises. Oxygen saturation, heart rate, blood pressure, rate of perceived exertion, and shortness of breath were all monitored before, during, and after exercise. Stanislaw presented with no problems at today's exercise session. He attended warning signs and symptoms class today.  The patient did not have an increase in workload intensity during today's exercise session.  Pre-exercise vitals: . Weight kg: 111.1 . Liters of O2: RA . SpO2: 94 . HR: 66 . BP: 132/70 . CBG: 170  Exercise vitals: . Highest heartrate:  80 . Lowest oxygen saturation: 95 . Highest blood pressure: 146/82 . Liters of 02: RA  Post-exercise vitals: . SpO2: 93 . HR: 64 . BP: 138/72 . Liters of O2: RA . CBG: 92 pt ate protein bar before leaving department after exercise Dr. Rush Farmer, Medical Director Dr. Eliseo Squires is immediately available during today's Pulmonary Rehab session for [place] on 01/22/2016  at 1030 class time  .

## 2016-01-27 ENCOUNTER — Encounter (HOSPITAL_COMMUNITY): Payer: Medicare Other

## 2016-01-28 ENCOUNTER — Ambulatory Visit (INDEPENDENT_AMBULATORY_CARE_PROVIDER_SITE_OTHER): Payer: Medicare Other | Admitting: Adult Health

## 2016-01-28 ENCOUNTER — Encounter: Payer: Self-pay | Admitting: Adult Health

## 2016-01-28 ENCOUNTER — Telehealth: Payer: Self-pay | Admitting: Adult Health

## 2016-01-28 VITALS — BP 128/72 | HR 65 | Ht 70.0 in | Wt 242.2 lb

## 2016-01-28 DIAGNOSIS — J441 Chronic obstructive pulmonary disease with (acute) exacerbation: Secondary | ICD-10-CM

## 2016-01-28 DIAGNOSIS — M150 Primary generalized (osteo)arthritis: Secondary | ICD-10-CM | POA: Diagnosis not present

## 2016-01-28 MED ORDER — DOXYCYCLINE HYCLATE 100 MG PO TABS: 100.0000 mg | ORAL_TABLET | Freq: Two times a day (BID) | ORAL | Status: DC

## 2016-01-28 MED ORDER — PREDNISONE 10 MG PO TABS: ORAL_TABLET | ORAL | Status: DC

## 2016-01-28 NOTE — Patient Instructions (Addendum)
Prednisone --Take 4 tabs PO daily x 3 days, then 3 tabs PO daily x 3 days, then 2 tabs PO daily x 3 days, then 1 tab PO daily x 3 days, then STOP. (30 tabs total) Doxycycline 1 tab twice daily x 7 days  Continue albuterol nebs every 4 hours as needed  Continue anoro ellipta 1 puff daily  Would skip pulmonary rehab tomorrow  Follow up with Dr. Lake Bells in 1 month Please contact office for sooner follow up if symptoms do not improve or worsen or seek emergency care

## 2016-01-28 NOTE — Telephone Encounter (Signed)
Per OV today: Patient Instructions       Prednisone --Take 4 tabs PO daily x 3 days, then 3 tabs PO daily x 3 days, then 2 tabs PO daily x 3 days, then 1 tab PO daily x 3 days, then STOP. (30 tabs total) Doxycycline 1 tab twice daily x 7 days   Continue albuterol nebs every 4 hours as needed   Continue anoro ellipta 1 puff daily   Would skip pulmonary rehab tomorrow   Follow up with Dr. Lake Bells in 1 month Please contact office for sooner follow up if symptoms do not improve or worsen or seek emergency care  ---  [facility] x1

## 2016-01-28 NOTE — Progress Notes (Signed)
Chart reviewed, Steven Newman is back again with yet another flare. I agree with this plan. Roselie Awkward, MD [facility] Pager: [phone] Cell: [phone] After 3pm or if no response, call [phone]

## 2016-01-28 NOTE — Progress Notes (Signed)
Chief Complaint  Patient presents with  . Acute Visit    BQ pt. Pt c/o cough with clear mucus, sinus pain/pressure, wheeze and SOB. Pt denies CP/tightness/f/n/v. Pt has been using albuterol nebs, Mucinex and albuterol HFA.      Tests Spirometry 07/2015>> clear airflow obstruction with an FEV1 of 1.3 L (42% predicted), FVC 1.7 L, 47% predicted  Past medical hx Past Medical History  Diagnosis Date  . Hx of colonic polyps   . Hyperlipidemia   . Erectile dysfunction   . Hypertension   . MI (myocardial infarction) ([place])     1976, 1985  . CAD (coronary artery disease)     coronary angioplasty 1995  . Obesity   . Pneumonia   . History of bladder cancer   . Skin cancer   . Kidney stones   . Dyspnea   . Wheezing   . Type 2 diabetes mellitus ([facility])      Past surgical hx, Allergies, Family hx, Social hx all reviewed.  Current Outpatient Prescriptions on File Prior to Visit  Medication Sig  . albuterol (PROVENTIL) (2.5 MG/3ML) 0.083% nebulizer solution Take 3 mLs (2.5 mg total) by nebulization every 4 (four) hours as needed for shortness of breath.  Marland Kitchen aspirin 81 MG tablet Take 81 mg by mouth daily.  . Cholecalciferol (VITAMIN D) 2000 UNITS tablet Take 2,000 Units by mouth daily.  . Coenzyme Q10 (CO Q-10) 100 MG CAPS Take 1 capsule by mouth daily.  . nadolol (CORGARD) 40 MG tablet Take 40 mg by mouth daily.  . nitroGLYCERIN (NITROSTAT) 0.4 MG SL tablet Place 0.4 mg under the tongue every 5 (five) minutes as needed for chest pain.  . Omega-3 Fatty Acids (RA FISH OIL) 1400 MG CPDR Take by mouth daily.  Marland Kitchen PRESCRIPTION MEDICATION Triflex for arthritis as needed  . Umeclidinium-Vilanterol (ANORO ELLIPTA) 62.5-25 MCG/INH AEPB Inhale 1 puff into the lungs daily.   No current facility-administered medications on file prior to visit.     Vital Signs BP 128/72 mmHg  Pulse 65  Ht 5\' 10"  (1.778 m)  Wt 242 lb 3.2 oz (109.861 kg)  BMI 34.75 kg/m2  SpO2 95%  History of Present  Illness Steven Newman is a 75 y.o. male with extensive smoking hx, followed by Dr. Lake Bells for COPD and Dr. Elsworth Soho for OSA.  Hx also includes HTN, CAD, DM2.  He is currently participating in pulmonary rehab.  Presents today for acute OV with c/o 3 day hx cough with clear sputum, more SOB than usual, decreased activity tolerance.  He had a "cold" a few weeks ago which he assumes he got from someone at pulmonary rehab.  It improved with mucinex, albuterol, rest and he returned to baseline.  He has not been able to "get over this round" with the same.   Denies fevers, chills, chest pain, hemoptysis, n/v/d, nasal drainage.   He had his flu shot this year.  No recent antibiotics - last abx were in October 2016.  Has NOT been smoking.  Does not wear O2 at home.     Physical Exam  General - Pleasant, chronically ill appearing male, No distress  ENT - No sinus tenderness, no oral exudate, no LAN Cardiac - s1s2 regular, no murmur Chest - resps even, non labored, mildly prolonged exhalation, scattered bilat exp wheeze  Back - No focal tenderness Abd - Soft, non-tender Ext - No edema Neuro - Normal strength Skin - No rashes Psych - normal mood, and behavior  Assessment/Plan  AECOPD  Patient Instructions  Prednisone --Take 4 tabs PO daily x 3 days, then 3 tabs PO daily x 3 days, then 2 tabs PO daily x 3 days, then 1 tab PO daily x 3 days, then STOP. (30 tabs total) Doxycycline 1 tab twice daily x 7 days  Continue albuterol nebs every 4 hours as needed  Continue anoro ellipta 1 puff daily  Would skip pulmonary rehab tomorrow  Follow up with Dr. Lake Bells in 1 month Please contact office for sooner follow up if symptoms do not improve or worsen or seek emergency care       Nickolas Madrid, NP 01/28/2016  11:30 AM

## 2016-01-29 ENCOUNTER — Encounter (HOSPITAL_COMMUNITY): Payer: Medicare Other

## 2016-01-29 NOTE — Telephone Encounter (Signed)
Patient's wife notified. \Nothing further needed.  

## 2016-01-29 NOTE — Telephone Encounter (Signed)
[place] for pt to continue mucinex

## 2016-01-29 NOTE — Telephone Encounter (Signed)
lmtcb x2 for pt. 

## 2016-01-29 NOTE — Telephone Encounter (Signed)
Pt wife return call.Hillery Hunter

## 2016-01-29 NOTE — Telephone Encounter (Signed)
Patient's wife calling to see if it is okay for patient to continue taking Mucinex.  Not DM, but regular. Katie - please advise if okay to continue Mucinex.

## 2016-02-03 ENCOUNTER — Telehealth (HOSPITAL_COMMUNITY): Payer: Self-pay | Admitting: *Deleted

## 2016-02-03 ENCOUNTER — Encounter (HOSPITAL_COMMUNITY): Payer: Medicare Other

## 2016-02-05 ENCOUNTER — Encounter (HOSPITAL_COMMUNITY)
Admission: RE | Admit: 2016-02-05 | Discharge: 2016-02-05 | Disposition: A | Discharge status: Home / Self Care | Payer: Medicare Other | Source: Ambulatory Visit | Attending: Pulmonary Disease | Admitting: Pulmonary Disease

## 2016-02-05 DIAGNOSIS — J439 Emphysema, unspecified: Secondary | ICD-10-CM | POA: Insufficient documentation

## 2016-02-05 NOTE — Progress Notes (Signed)
Daily Session Note  Patient Details  Name: Steven Newman MRN: [id number] Date of Birth: October 15, 1941 Referring Provider:  Garlan Fair, MD  Encounter Date: 02/05/2016  Check In:     Session Check In - 02/05/16 1133    Check-In   Location [facility]   Staff Present Rosebud Poles, RN, Luisa Hart, RN, BSN;Ramon Dredge, RN, MHA;Jessica Luan Pulling, MA, ACSM RCEP, Exercise Physiologist   Supervising physician immediately available to respond to emergencies [facility] immediately available   Physician(s) Dr. Marily Memos   Medication changes reported     No   Fall or balance concerns reported    No   Warm-up and Cool-down Performed as group-led instruction   Resistance Training Performed Yes   VAD Patient? No   Pain Assessment   Currently in Pain? No/denies   Multiple Pain Sites No      Capillary Blood Glucose: No results found for this or any previous visit (from the past 24 hour(s)).     POCT Glucose - 02/05/16 1249    POCT Blood Glucose   Pre-Exercise 155 mg/dL         Exercise Prescription Changes - 02/05/16 1200    Exercise Review   Progression No   Response to Exercise   Blood Pressure (Admit) 134/66 mmHg   Blood Pressure (Exercise) 148/84 mmHg   Blood Pressure (Exit) 118/70 mmHg   Heart Rate (Admit) 61 bpm   Heart Rate (Exercise) 74 bpm   Heart Rate (Exit) 64 bpm   Oxygen Saturation (Admit) 94 %   Oxygen Saturation (Exercise) 95 %   Oxygen Saturation (Exit) 97 %   Rating of Perceived Exertion (Exercise) 10   Perceived Dyspnea (Exercise) 0   Duration Progress to 45 minutes of aerobic exercise without signs/symptoms of physical distress   Intensity THRR unchanged  40-80% HRR   Resistance Training   Training Prescription Yes   Weight orange bands   Reps 10-12   Treadmill   MPH 2   Grade 1   Minutes 15   Track   Laps 16   Minutes 15     Goals Met:  Independence with exercise equipment Improved SOB with ADL's Using PLB  without cueing & demonstrates good technique Exercise tolerated well No report of cardiac concerns or symptoms Strength training completed today  Goals Unmet:  Not Applicable  Comments: Service time is from 1030 to 1230, see ITP for education documentation   Dr. Rush Farmer is Medical Director for Pulmonary Rehab at [facility].

## 2016-02-06 DIAGNOSIS — Z85828 Personal history of other malignant neoplasm of skin: Secondary | ICD-10-CM | POA: Diagnosis not present

## 2016-02-06 DIAGNOSIS — L821 Other seborrheic keratosis: Secondary | ICD-10-CM | POA: Diagnosis not present

## 2016-02-06 DIAGNOSIS — L720 Epidermal cyst: Secondary | ICD-10-CM | POA: Diagnosis not present

## 2016-02-06 DIAGNOSIS — L570 Actinic keratosis: Secondary | ICD-10-CM | POA: Diagnosis not present

## 2016-02-10 ENCOUNTER — Encounter (HOSPITAL_COMMUNITY)
Admission: RE | Admit: 2016-02-10 | Discharge: 2016-02-10 | Disposition: A | Discharge status: Home / Self Care | Payer: Medicare Other | Source: Ambulatory Visit | Attending: Pulmonary Disease | Admitting: Pulmonary Disease

## 2016-02-10 DIAGNOSIS — J439 Emphysema, unspecified: Secondary | ICD-10-CM | POA: Diagnosis not present

## 2016-02-10 NOTE — Progress Notes (Addendum)
Daily Session Note  Patient Details  Name: Steven Newman MRN: [id number] Date of Birth: [birth year] Referring Provider:  Garlan Fair, MD  Encounter Date: 02/10/2016  Check In:     Session Check In - 02/10/16 1016    Check-In   Location [facility]   Staff Present Rosebud Poles, RN, Levie Heritage, MA, ACSM RCEP, Exercise Physiologist;Portia Rollene Rotunda, RN, BSN   Supervising physician immediately available to respond to emergencies [facility] immediately available   Physician(s) Dr. Marily Memos   Medication changes reported     No   Fall or balance concerns reported    No   Warm-up and Cool-down Performed as group-led instruction   Resistance Training Performed Yes   VAD Patient? No   Pain Assessment   Currently in Pain? No/denies      Capillary Blood Glucose: No results found for this or any previous visit (from the past 24 hour(s)).      Exercise Prescription Changes - 02/10/16 1200    Exercise Review   Progression No   Response to Exercise   Blood Pressure (Admit) 112/70 mmHg   Blood Pressure (Exercise) 142/70 mmHg   Blood Pressure (Exit) 126/74 mmHg   Heart Rate (Admit) 70 bpm   Heart Rate (Exercise) 84 bpm   Heart Rate (Exit) 66 bpm   Oxygen Saturation (Admit) 94 %   Oxygen Saturation (Exercise) 93 %   Oxygen Saturation (Exit) 94 %   Rating of Perceived Exertion (Exercise) 11   Perceived Dyspnea (Exercise) 1   Symptoms none   Comments none   Duration Progress to 45 minutes of aerobic exercise without signs/symptoms of physical distress   Intensity Other (comment)   Progression   Progression --  40-80% HRR   Resistance Training   Training Prescription Yes   Weight orange bands   Reps 10-12   Treadmill   MPH 2   Grade 1   Minutes 15   Track   Laps 24   Minutes 30     Goals Met:  Independence with exercise equipment Using PLB without cueing & demonstrates good technique Strength training completed today  Goals Unmet:  Not  Applicable  Comments: Service time is from 1030 to 1200    Dr. Rush Farmer is Medical Director for Pulmonary Rehab at [facility].

## 2016-02-12 ENCOUNTER — Encounter (HOSPITAL_COMMUNITY)
Admission: RE | Admit: 2016-02-12 | Discharge: 2016-02-12 | Disposition: A | Discharge status: Home / Self Care | Payer: Medicare Other | Source: Ambulatory Visit | Attending: Pulmonary Disease | Admitting: Pulmonary Disease

## 2016-02-12 DIAGNOSIS — J439 Emphysema, unspecified: Secondary | ICD-10-CM | POA: Diagnosis not present

## 2016-02-12 NOTE — Progress Notes (Signed)
Daily Session Note  Patient Details  Name: MARCOS RUELAS MRN: [id number] Date of Birth: 02/22/1941 Referring Provider:  Garlan Fair, MD  Encounter Date: 02/12/2016  Check In:     Session Check In - 02/12/16 1014    Check-In   Location [facility]   Staff Present Rosebud Poles, RN, Levie Heritage, MA, ACSM RCEP, Exercise Physiologist;Portia Rollene Rotunda, RN, BSN   Supervising physician immediately available to respond to emergencies [facility] immediately available   Physician(s)  Dr. Waldron Labs   Medication changes reported     No   Fall or balance concerns reported    No   Warm-up and Cool-down Performed as group-led instruction   Resistance Training Performed Yes   VAD Patient? No   Pain Assessment   Currently in Pain? No/denies   Multiple Pain Sites No      Capillary Blood Glucose: No results found for this or any previous visit (from the past 24 hour(s)).      Exercise Prescription Changes - 02/12/16 1300    Exercise Review   Progression No   Response to Exercise   Blood Pressure (Admit) 122/70 mmHg   Blood Pressure (Exercise) 130/80 mmHg   Blood Pressure (Exit) 104/60 mmHg   Heart Rate (Admit) 63 bpm   Heart Rate (Exercise) 88 bpm   Heart Rate (Exit) 65 bpm   Oxygen Saturation (Admit) 93 %   Oxygen Saturation (Exercise) 93 %   Oxygen Saturation (Exit) 93 %   Rating of Perceived Exertion (Exercise) 11   Perceived Dyspnea (Exercise) 0   Duration Progress to 45 minutes of aerobic exercise without signs/symptoms of physical distress   Intensity THRR unchanged   Progression   Progression Continue to progress workloads to maintain intensity without signs/symptoms of physical distress.   Resistance Training   Training Prescription Yes   Weight orange bands   Reps 10-12   Interval Training   Interval Training No   Track   Laps 30   Minutes 30     Goals Met:  Proper associated with RPD/PD & O2 Sat Independence with exercise  equipment Improved SOB with ADL's Exercise tolerated well Strength training completed today  Goals Unmet:  Not Applicable  Comments: Service time is from 1030 to 1215  SEE PAPER DOCUMENTATION FOR EDUCATION TOPIC TODAY  Dr. Rush Farmer is Medical Director for Pulmonary Rehab at [facility].

## 2016-02-16 DIAGNOSIS — L570 Actinic keratosis: Secondary | ICD-10-CM | POA: Diagnosis not present

## 2016-02-17 ENCOUNTER — Encounter (HOSPITAL_COMMUNITY)
Admission: RE | Admit: 2016-02-17 | Discharge: 2016-02-17 | Disposition: A | Discharge status: Home / Self Care | Payer: Medicare Other | Source: Ambulatory Visit | Attending: Pulmonary Disease | Admitting: Pulmonary Disease

## 2016-02-17 DIAGNOSIS — J439 Emphysema, unspecified: Secondary | ICD-10-CM | POA: Diagnosis not present

## 2016-02-17 NOTE — Progress Notes (Signed)
Daily Session Note  Patient Details  Name: Steven Newman MRN: [id number] Date of Birth: 10-20-1941 Referring Provider:  Garlan Fair, MD  Encounter Date: 02/17/2016  Check In:     Session Check In - 02/17/16 1009    Check-In   Location [facility]   Staff Present Rosebud Poles, RN, Levie Heritage, MA, ACSM RCEP, Exercise Physiologist;Portia Rollene Rotunda, RN, BSN;Ramon Dredge, RN, [facility]   Supervising physician immediately available to respond to emergencies [facility] immediately available   Physician(s) Dr. Marily Memos   Medication changes reported     No   Fall or balance concerns reported    No   Warm-up and Cool-down Performed as group-led instruction   Resistance Training Performed Yes   VAD Patient? No   Pain Assessment   Currently in Pain? No/denies      Capillary Blood Glucose: No results found for this or any previous visit (from the past 24 hour(s)).     POCT Glucose - 02/17/16 1240    POCT Blood Glucose   Pre-Exercise 166 mg/dL         Exercise Prescription Changes - 02/17/16 1200    Exercise Review   Progression No   Response to Exercise   Blood Pressure (Admit) 126/64 mmHg   Blood Pressure (Exercise) 128/78 mmHg   Blood Pressure (Exit) 132/80 mmHg   Heart Rate (Admit) 62 bpm   Heart Rate (Exercise) 77 bpm   Heart Rate (Exit) 65 bpm   Oxygen Saturation (Admit) 93 %   Oxygen Saturation (Exercise) 94 %   Oxygen Saturation (Exit) 95 %   Rating of Perceived Exertion (Exercise) 10   Perceived Dyspnea (Exercise) 0   Symptoms none   Comments none   Duration Progress to 45 minutes of aerobic exercise without signs/symptoms of physical distress   Intensity Other (comment)   Progression   Progression --  40-80% HRR   Resistance Training   Training Prescription Yes   Weight orange bands   Reps 10-12   Interval Training   Interval Training No   Treadmill   MPH 2   Grade 1   Minutes 15   Track   Laps 26   Minutes 30      Goals Met:  Using PLB without cueing & demonstrates good technique Exercise tolerated well No report of cardiac concerns or symptoms Strength training completed today  Goals Unmet:  Not Applicable  Comments: Service time is from 1030 to 1230    Dr. Rush Farmer is Medical Director for Pulmonary Rehab at [facility].

## 2016-02-19 ENCOUNTER — Encounter (HOSPITAL_COMMUNITY)
Admission: RE | Admit: 2016-02-19 | Discharge: 2016-02-19 | Disposition: A | Discharge status: Home / Self Care | Payer: Medicare Other | Source: Ambulatory Visit | Attending: Pulmonary Disease | Admitting: Pulmonary Disease

## 2016-02-19 DIAGNOSIS — J439 Emphysema, unspecified: Secondary | ICD-10-CM | POA: Diagnosis not present

## 2016-02-19 NOTE — Progress Notes (Signed)
Daily Session Note  Patient Details  Name: Steven Newman MRN: [id number] Date of Birth: Mar 11, 1941 Referring Provider:  Garlan Fair, MD  Encounter Date: 02/19/2016  Check In:     Session Check In - 02/19/16 1057    Check-In   [facility]   Staff Present Rosebud Poles, RN, Fletcher Anon, M.Ed., RD, LDN, CDE, Clinical Nutritionist Lamar Sprinkles, MA, ACSM RCEP, Exercise Physiologist;Lisa Ysidro Evert, Felipe Drone, RN, MHA;Dex Blakely Rollene Rotunda, RN, BSN   Supervising physician immediately available to respond to emergencies [facility] immediately available   Physician(s) Dr. Jerilee Hoh   Medication changes reported     No   Fall or balance concerns reported    No   Warm-up and Cool-down Performed as group-led instruction   Resistance Training Performed Yes   VAD Patient? No   Pain Assessment   Currently in Pain? No/denies   Multiple Pain Sites No      Capillary Blood Glucose: No results found for this or any previous visit (from the past 24 hour(s)).      Exercise Prescription Changes - 02/19/16 1300    Exercise Review   Progression No   Response to Exercise   Blood Pressure (Admit) 118/58 mmHg   Blood Pressure (Exercise) 124/80 mmHg   Blood Pressure (Exit) 126/64 mmHg   Heart Rate (Admit) 61 bpm   Heart Rate (Exercise) 88 bpm   Heart Rate (Exit) 65 bpm   Oxygen Saturation (Admit) 95 %   Oxygen Saturation (Exercise) 96 %   Oxygen Saturation (Exit) 95 %   Rating of Perceived Exertion (Exercise) 11   Perceived Dyspnea (Exercise) 0   Symptoms none   Comments none   Duration Progress to 45 minutes of aerobic exercise without signs/symptoms of physical distress   Intensity THRR unchanged   Progression   Progression Continue to progress workloads to maintain intensity without signs/symptoms of physical distress.   Resistance Training   Training Prescription Yes   Weight orange bands   Reps 10-12   Interval Training   Interval  Training No   Treadmill   MPH 2   Grade 1   Minutes 15   Track   Laps 16   Minutes 15     Goals Met:  Independence with exercise equipment Improved SOB with ADL's Using PLB without cueing & demonstrates good technique Exercise tolerated well No report of cardiac concerns or symptoms Strength training completed today  Goals Unmet:  Not Applicable  Comments: Service time is from 1030 to 1240, see paper ITP for education   Dr. Rush Farmer is Medical Director for Pulmonary Rehab at [facility].

## 2016-02-24 ENCOUNTER — Encounter (HOSPITAL_COMMUNITY)
Admission: RE | Admit: 2016-02-24 | Discharge: 2016-02-24 | Disposition: A | Discharge status: Home / Self Care | Payer: Medicare Other | Source: Ambulatory Visit | Attending: Pulmonary Disease | Admitting: Pulmonary Disease

## 2016-02-24 DIAGNOSIS — J439 Emphysema, unspecified: Secondary | ICD-10-CM | POA: Diagnosis not present

## 2016-02-24 NOTE — Progress Notes (Signed)
Daily Session Note  Patient Details  Name: Steven Newman MRN: [id number] Date of Birth: [birth year] Referring Provider:  Garlan Fair, MD  Encounter Date: 02/24/2016  Check In:     Session Check In - 02/24/16 1221    Check-In   [facility]   Staff Present Rosebud Poles, RN, BSN;Ramon Dredge, RN, MHA;Jessica Luan Pulling, MA, ACSM RCEP, Exercise Physiologist;Portia Rollene Rotunda, RN, BSN   Supervising physician immediately available to respond to emergencies [facility] immediately available   Physician(s) Dr. Marily Memos   Medication changes reported     No   Fall or balance concerns reported    No   Warm-up and Cool-down Performed as group-led instruction   Resistance Training Performed Yes   VAD Patient? No   Pain Assessment   Currently in Pain? No/denies   Multiple Pain Sites No      Capillary Blood Glucose: No results found for this or any previous visit (from the past 24 hour(s)).      Exercise Prescription Changes - 02/24/16 1200    Exercise Review   Progression No   Response to Exercise   Blood Pressure (Admit) 130/62 mmHg   Blood Pressure (Exercise) 108/64 mmHg   Blood Pressure (Exit) 118/68 mmHg   Heart Rate (Admit) 68 bpm   Heart Rate (Exercise) 78 bpm   Heart Rate (Exit) 71 bpm   Oxygen Saturation (Admit) 94 %   Oxygen Saturation (Exercise) 94 %   Oxygen Saturation (Exit) 94 %   Rating of Perceived Exertion (Exercise) 10   Perceived Dyspnea (Exercise) 0   Symptoms none   Comments none   Duration Progress to 45 minutes of aerobic exercise without signs/symptoms of physical distress   Intensity THRR unchanged   Progression   Progression Continue to progress workloads to maintain intensity without signs/symptoms of physical distress.   Resistance Training   Training Prescription Yes   Weight orange bands   Reps 10-12   Interval Training   Interval Training No   Oxygen   Oxygen --   Liters --   Treadmill   MPH 2   Grade 1    Minutes 15   Track   Laps 32   Minutes 30     Goals Met:  Independence with exercise equipment Using PLB without cueing & demonstrates good technique Exercise tolerated well No report of cardiac concerns or symptoms Strength training completed today  Goals Unmet:  Not Applicable  Comments: Service time is from 1030 to 1215    Dr. Rush Farmer is Medical Director for Pulmonary Rehab at [facility].

## 2016-02-25 ENCOUNTER — Encounter: Payer: Self-pay | Admitting: Adult Health

## 2016-02-25 ENCOUNTER — Other Ambulatory Visit: Payer: Self-pay | Admitting: Acute Care

## 2016-02-25 ENCOUNTER — Ambulatory Visit (INDEPENDENT_AMBULATORY_CARE_PROVIDER_SITE_OTHER): Payer: Medicare Other | Admitting: Adult Health

## 2016-02-25 VITALS — BP 142/78 | HR 71 | Ht 69.5 in | Wt 243.6 lb

## 2016-02-25 DIAGNOSIS — J432 Centrilobular emphysema: Secondary | ICD-10-CM

## 2016-02-25 DIAGNOSIS — Z87891 Personal history of nicotine dependence: Secondary | ICD-10-CM

## 2016-02-25 NOTE — Progress Notes (Signed)
Chart reviewed, agree with above

## 2016-02-25 NOTE — Progress Notes (Signed)
Chief Complaint  Patient presents with  . Follow-up    breathing has been doing well.  doing pulmonary rehab, started mid Jan through end of April.  no concerns.     Tests Spirometry 07/2015>> clear airflow obstruction with an FEV1 of 1.3 L (42% predicted), FVC 1.7 L, 47% predicted  Past medical hx Past Medical History  Diagnosis Date  . Hx of colonic polyps   . Hyperlipidemia   . Erectile dysfunction   . Hypertension   . MI (myocardial infarction) ([place])     1976, 1985  . CAD (coronary artery disease)     coronary angioplasty 1995  . Obesity   . Pneumonia   . History of bladder cancer   . Skin cancer   . Kidney stones   . Dyspnea   . Wheezing   . Type 2 diabetes mellitus (HCC)      Past surgical hx, Allergies, Family hx, Social hx all reviewed.  Current Outpatient Prescriptions on File Prior to Visit  Medication Sig  . albuterol (PROVENTIL) (2.5 MG/3ML) 0.083% nebulizer solution Take 3 mLs (2.5 mg total) by nebulization every 4 (four) hours as needed for shortness of breath.  Marland Kitchen aspirin 81 MG tablet Take 81 mg by mouth daily.  Marland Kitchen atorvastatin (LIPITOR) 40 MG tablet Take 40 mg by mouth daily.  . Cholecalciferol (VITAMIN D) 2000 UNITS tablet Take 2,000 Units by mouth daily.  . Coenzyme Q10 (CO Q-10) 100 MG CAPS Take 1 capsule by mouth daily.  . nadolol (CORGARD) 40 MG tablet Take 40 mg by mouth daily.  . nitroGLYCERIN (NITROSTAT) 0.4 MG SL tablet Place 0.4 mg under the tongue every 5 (five) minutes as needed for chest pain.  . Omega-3 Fatty Acids (RA FISH OIL) 1400 MG CPDR Take by mouth daily.  Marland Kitchen PRESCRIPTION MEDICATION Triflex for arthritis as needed  . Umeclidinium-Vilanterol (ANORO ELLIPTA) 62.5-25 MCG/INH AEPB Inhale 1 puff into the lungs daily.   No current facility-administered medications on file prior to visit.     Vital Signs BP 142/78 mmHg  Pulse 71  Ht 5' 9.5" (1.765 m)  Wt 243 lb 9.6 oz (110.496 kg)  BMI 35.47 kg/m2  SpO2 97%  History of Present  Illness ASSAEL LOEFFELHOLZ is a 75 y.o. male with extensive smoking hx, followed by Dr. Lake Bells for COPD and Dr. Elsworth Soho for OSA. Hx also includes HTN, CAD, DM2. Had acute OV on 2/22 for AECOPD.  Returns today for follow up.  He is feeling much better, back to baseline and back to pulmonary rehab.    Denies SOB at rest.  Does have some DOE but thinks his exercise tolerance continues to improve with rehab.    Denies chest pain, hemoptysis, purulent sputum, orthopnea, edema.    Rarely uses his albuterol.     Physical Exam  General - pleasant male, No distress  ENT - No sinus tenderness, no oral exudate, no LAN Cardiac - s1s2 regular, no murmur Chest - resps even non labored on RA, No audible wheeze  Back - No focal tenderness Abd - Soft, non-tender Ext - No edema Neuro - Normal strength Skin - No rashes Psych - normal mood, and behavior   Assessment/Plan  COPD - improved after recent flare.  Back to baseline.  Hx tobacco abuse - quit July 2016. Reinforced.   Patient Instructions  Continue pulmonary rehab  Continue anoro 1 puff daily and albuterol as needed  Follow up with Dr. Lake Bells in 6 months  Please contact  office for sooner follow up if needed       Nickolas Madrid, NP 02/25/2016  10:04 AM

## 2016-02-25 NOTE — Patient Instructions (Addendum)
Continue pulmonary rehab  Continue anoro 1 puff daily and albuterol as needed  Follow up with Dr. Lake Bells in 6 months  Please contact office for sooner follow up if needed

## 2016-02-26 ENCOUNTER — Encounter (HOSPITAL_COMMUNITY)
Admission: RE | Admit: 2016-02-26 | Discharge: 2016-02-26 | Disposition: A | Discharge status: Home / Self Care | Payer: Medicare Other | Source: Ambulatory Visit | Attending: Pulmonary Disease | Admitting: Pulmonary Disease

## 2016-02-26 DIAGNOSIS — J439 Emphysema, unspecified: Secondary | ICD-10-CM | POA: Diagnosis not present

## 2016-02-26 NOTE — Progress Notes (Signed)
Daily Session Note  Patient Details  Name: Steven Newman MRN: [id number] Date of Birth: [birth year] Referring Provider:  Garlan Fair, MD  Encounter Date: 02/26/2016  Check In:     Session Check In - 02/26/16 1041    Check-In   [facility]   Staff Present Rosebud Poles, RN, Luisa Hart, RN, BSN;Ramon Dredge, RN, MHA;Jessica Luan Pulling, MA, ACSM RCEP, Exercise Physiologist;Maria Venetia Maxon, RN, BSN   Supervising physician immediately available to respond to emergencies [facility] immediately available   Physician(s) Dr. Waldron Labs   Medication changes reported     No   Fall or balance concerns reported    No   Warm-up and Cool-down Performed as group-led instruction   Resistance Training Performed Yes   VAD Patient? No   Pain Assessment   Currently in Pain? No/denies   Multiple Pain Sites No      Capillary Blood Glucose: No results found for this or any previous visit (from the past 24 hour(s)).      Exercise Prescription Changes - 02/26/16 1200    Exercise Review   Progression No   Response to Exercise   Blood Pressure (Admit) 122/70 mmHg   Blood Pressure (Exercise) 132/60 mmHg   Blood Pressure (Exit) 112/68 mmHg   Heart Rate (Admit) 68 bpm   Heart Rate (Exercise) 75 bpm   Heart Rate (Exit) 63 bpm   Oxygen Saturation (Admit) 94 %   Oxygen Saturation (Exercise) 95 %   Oxygen Saturation (Exit) 95 %   Rating of Perceived Exertion (Exercise) 11   Perceived Dyspnea (Exercise) 0   Symptoms none   Comments none   Duration Progress to 45 minutes of aerobic exercise without signs/symptoms of physical distress   Intensity THRR unchanged   Progression   Progression Continue to progress workloads to maintain intensity without signs/symptoms of physical distress.   Resistance Training   Training Prescription Yes   Weight orange bands   Reps 10-12   Interval Training   Interval Training No   Treadmill   MPH 2   Grade 1   Minutes  15   Track   Laps 16   Minutes 15     Goals Met:  Independence with exercise equipment Using PLB without cueing & demonstrates good technique Exercise tolerated well Strength training completed today  Goals Unmet:  Not Applicable  Comments: Service time is from 1030 to [place] attended the Meditation and Mindfullness class today by Jeanella Craze.   Dr. Rush Farmer is Medical Director for Pulmonary Rehab at [facility].

## 2016-02-27 DIAGNOSIS — L570 Actinic keratosis: Secondary | ICD-10-CM | POA: Diagnosis not present

## 2016-02-27 DIAGNOSIS — L821 Other seborrheic keratosis: Secondary | ICD-10-CM | POA: Diagnosis not present

## 2016-02-27 DIAGNOSIS — Z85828 Personal history of other malignant neoplasm of skin: Secondary | ICD-10-CM | POA: Diagnosis not present

## 2016-03-02 ENCOUNTER — Encounter (HOSPITAL_COMMUNITY)
Admission: RE | Admit: 2016-03-02 | Discharge: 2016-03-02 | Disposition: A | Discharge status: Home / Self Care | Payer: Medicare Other | Source: Ambulatory Visit | Attending: Pulmonary Disease | Admitting: Pulmonary Disease

## 2016-03-02 DIAGNOSIS — J439 Emphysema, unspecified: Secondary | ICD-10-CM | POA: Diagnosis not present

## 2016-03-02 NOTE — Progress Notes (Signed)
Daily Session Note  Patient Details  Name: Steven Newman MRN: [id number] Date of Birth: [birth year] Referring Provider:  Garlan Fair, MD  Encounter Date: 03/02/2016  Check In:     Session Check In - 03/02/16 1206    Check-In   [facility]   Staff Present Rosebud Poles, RN, Levie Heritage, MA, ACSM RCEP, Exercise Physiologist;Portia Rollene Rotunda, RN, BSN;Ramon Dredge, RN, [facility]   Supervising physician immediately available to respond to emergencies [facility] immediately available   Physician(s) Dr. Marily Memos   Medication changes reported     No   Fall or balance concerns reported    No   Warm-up and Cool-down Performed as group-led instruction   Resistance Training Performed Yes   VAD Patient? No   Pain Assessment   Currently in Pain? No/denies   Multiple Pain Sites No      Capillary Blood Glucose: No results found for this or any previous visit (from the past 24 hour(s)).      Exercise Prescription Changes - 03/02/16 1200    Exercise Review   Progression No   Response to Exercise   Blood Pressure (Admit) 124/72 mmHg   Blood Pressure (Exercise) 130/68 mmHg   Blood Pressure (Exit) 124/70 mmHg   Heart Rate (Admit) 69 bpm   Heart Rate (Exercise) 73 bpm   Heart Rate (Exit) 64 bpm   Oxygen Saturation (Admit) 95 %   Oxygen Saturation (Exercise) 93 %   Oxygen Saturation (Exit) 95 %   Rating of Perceived Exertion (Exercise) 11   Perceived Dyspnea (Exercise) 1   Symptoms none   Comments none   Duration Progress to 45 minutes of aerobic exercise without signs/symptoms of physical distress   Intensity THRR unchanged   Progression   Progression Continue to progress workloads to maintain intensity without signs/symptoms of physical distress.   Resistance Training   Training Prescription Yes   Weight orange bands   Reps 10-12   Interval Training   Interval Training No   Treadmill   MPH 2   Grade 1   Minutes 15   Track   Laps 25   Minutes 30     Goals Met:  Independence with exercise equipment Using PLB without cueing & demonstrates good technique Exercise tolerated well No report of cardiac concerns or symptoms Strength training completed today  Goals Unmet:  Not Applicable  Comments: Service time is from 1030 to 1200    Dr. Rush Farmer is Medical Director for Pulmonary Rehab at [facility].

## 2016-03-04 ENCOUNTER — Encounter (HOSPITAL_COMMUNITY)
Admission: RE | Admit: 2016-03-04 | Discharge: 2016-03-04 | Disposition: A | Discharge status: Home / Self Care | Payer: Medicare Other | Source: Ambulatory Visit | Attending: Pulmonary Disease | Admitting: Pulmonary Disease

## 2016-03-04 DIAGNOSIS — J438 Other emphysema: Secondary | ICD-10-CM

## 2016-03-04 DIAGNOSIS — J439 Emphysema, unspecified: Secondary | ICD-10-CM | POA: Diagnosis not present

## 2016-03-04 NOTE — Progress Notes (Signed)
Daily Session Note  Patient Details  Name: Steven Newman MRN: [id number] Date of Birth: Sep 05, 1941 Referring Provider:  Garlan Fair, MD  Encounter Date: 03/04/2016  Check In:     Session Check In - 03/04/16 1649    Check-In   [facility]   Staff Present Rosebud Poles, RN, BSN;Retta Pitcher, MS, ACSM RCEP, Exercise Physiologist;Portia Rollene Rotunda, RN, Roque Cash, RN   Supervising physician immediately available to respond to emergencies [facility] immediately available   Physician(s) Dr. Marily Memos   Medication changes reported     No   Fall or balance concerns reported    No   Warm-up and Cool-down Performed as group-led instruction   Resistance Training Performed No   VAD Patient? No   Pain Assessment   Currently in Pain? No/denies   Multiple Pain Sites No      Capillary Blood Glucose: No results found for this or any previous visit (from the past 24 hour(s)).      Exercise Prescription Changes - 03/04/16 1220    Exercise Review   Progression No   Response to Exercise   Blood Pressure (Admit) 124/86 mmHg   Blood Pressure (Exercise) 130/74 mmHg   Blood Pressure (Exit) 122/72 mmHg   Heart Rate (Admit) 68 bpm   Heart Rate (Exercise) 78 bpm   Heart Rate (Exit) 71 bpm   Oxygen Saturation (Admit) 95 %   Oxygen Saturation (Exercise) 95 %   Oxygen Saturation (Exit) 95 %   Rating of Perceived Exertion (Exercise) 9   Perceived Dyspnea (Exercise) 1   Symptoms none   Comments none   Duration Progress to 45 minutes of aerobic exercise without signs/symptoms of physical distress   Intensity THRR unchanged   Progression   Progression Continue to progress workloads to maintain intensity without signs/symptoms of physical distress.   Resistance Training   Training Prescription Yes   Weight orange bands   Reps 10-12   Interval Training   Interval Training No   Treadmill   MPH --   Grade --   Minutes --   Track   Laps 27   Minutes 30      Goals Met:  Exercise tolerated well No report of cardiac concerns or symptoms Strength training completed today  Goals Unmet:  Not Applicable  Comments: Service time is from 10:30AM to 12:20PM    Dr. Rush Farmer is Medical Director for Pulmonary Rehab at [facility].

## 2016-03-09 ENCOUNTER — Encounter (HOSPITAL_COMMUNITY)
Admission: RE | Admit: 2016-03-09 | Discharge: 2016-03-09 | Disposition: A | Discharge status: Home / Self Care | Payer: Medicare Other | Source: Ambulatory Visit | Attending: Pulmonary Disease | Admitting: Pulmonary Disease

## 2016-03-09 DIAGNOSIS — J439 Emphysema, unspecified: Secondary | ICD-10-CM | POA: Diagnosis not present

## 2016-03-09 NOTE — Progress Notes (Signed)
Daily Session Note  Patient Details  Name: Steven Newman MRN: [id number] Date of Birth: 1941-09-22 Referring Provider:  Lake Bells  Encounter Date: 03/09/2016  Check In:     Session Check In - 03/09/16 1004    Check-In   [facility]   Staff Present Rodney Langton, RN;Makar Slatter Luan Pulling, MA, ACSM RCEP, Exercise Physiologist;Portia Rollene Rotunda, RN, BSN;Ramon Dredge, RN, [facility]   Supervising physician immediately available to respond to emergencies [facility] immediately available   Physician(s) Dr. Marily Memos   Medication changes reported     No   Fall or balance concerns reported    No   Warm-up and Cool-down Performed as group-led instruction   Resistance Training Performed Yes   VAD Patient? No   Pain Assessment   Currently in Pain? No/denies   Multiple Pain Sites No      Capillary Blood Glucose: No results found for this or any previous visit (from the past 24 hour(s)).      Exercise Prescription Changes - 03/09/16 1200    Exercise Review   Progression No   Response to Exercise   Blood Pressure (Admit) 126/62 mmHg   Blood Pressure (Exercise) 132/66 mmHg   Blood Pressure (Exit) 120/64 mmHg   Heart Rate (Admit) 61 bpm   Heart Rate (Exercise) 72 bpm   Heart Rate (Exit) 70 bpm   Oxygen Saturation (Admit) 94 %   Oxygen Saturation (Exercise) 95 %   Oxygen Saturation (Exit) 94 %   Rating of Perceived Exertion (Exercise) 9   Perceived Dyspnea (Exercise) 0   Symptoms none   Comments none   Duration Progress to 45 minutes of aerobic exercise without signs/symptoms of physical distress   Intensity THRR unchanged   Progression   Progression Continue to progress workloads to maintain intensity without signs/symptoms of physical distress.   Resistance Training   Training Prescription Yes   Weight orange bands   Reps 10-12   Treadmill   MPH 2   Grade 1   Minutes 15   Track   Laps 27   Minutes 30     Goals Met:  Using PLB without cueing &  demonstrates good technique Exercise tolerated well No report of cardiac concerns or symptoms Strength training completed today  Goals Unmet:  Not Applicable  Comments: Service time is from 1030 to 1210.    Dr. Rush Farmer is Medical Director for Pulmonary Rehab at [facility].

## 2016-03-11 ENCOUNTER — Encounter (HOSPITAL_COMMUNITY)
Admission: RE | Admit: 2016-03-11 | Discharge: 2016-03-11 | Disposition: A | Discharge status: Home / Self Care | Payer: Medicare Other | Source: Ambulatory Visit | Attending: Pulmonary Disease | Admitting: Pulmonary Disease

## 2016-03-11 VITALS — Wt 241.6 lb

## 2016-03-11 DIAGNOSIS — J439 Emphysema, unspecified: Secondary | ICD-10-CM | POA: Diagnosis not present

## 2016-03-11 DIAGNOSIS — J438 Other emphysema: Secondary | ICD-10-CM

## 2016-03-11 NOTE — Progress Notes (Signed)
Daily Session Note  Patient Details  Name: Steven Newman MRN: [id number] Date of Birth: 07/04/1941 Referring Provider:  Garlan Fair, MD  Encounter Date: 03/11/2016  Check In:     Session Check In - 03/11/16 1211    Check-In   [facility]   Staff Present Rosebud Poles, RN, BSN;Molly diVincenzo, MS, ACSM RCEP, Exercise Physiologist;Portia Rollene Rotunda, RN, Roque Cash, RN   Supervising physician immediately available to respond to emergencies [facility] immediately available   Physician(s) Dr. Loleta Books   Medication changes reported     No   Fall or balance concerns reported    No   Warm-up and Cool-down Performed as group-led instruction   Resistance Training Performed Yes   VAD Patient? No   Pain Assessment   Currently in Pain? No/denies   Multiple Pain Sites No      Capillary Blood Glucose: No results found for this or any previous visit (from the past 24 hour(s)).      Exercise Prescription Changes - 03/11/16 1200    Exercise Review   Progression No   Response to Exercise   Blood Pressure (Admit) 130/70 mmHg   Blood Pressure (Exercise) 130/80 mmHg   Blood Pressure (Exit) 120/70 mmHg   Heart Rate (Admit) 61 bpm   Heart Rate (Exercise) 72 bpm   Heart Rate (Exit) 69 bpm   Oxygen Saturation (Admit) 94 %   Oxygen Saturation (Exercise) 94 %   Oxygen Saturation (Exit) 94 %   Rating of Perceived Exertion (Exercise) 11   Perceived Dyspnea (Exercise) 0   Duration Progress to 45 minutes of aerobic exercise without signs/symptoms of physical distress   Intensity THRR unchanged   Progression   Progression Continue to progress workloads to maintain intensity without signs/symptoms of physical distress.   Resistance Training   Training Prescription Yes   Weight orange bands   Reps 10-12   Interval Training   Interval Training No   Track   Laps 27   Minutes 30     Goals Met:  Exercise tolerated well No report of cardiac concerns or  symptoms Strength training completed today  Goals Unmet:  Not Applicable  Comments: Service time is from 1030 to 1230    Dr. Rush Farmer is Medical Director for Pulmonary Rehab at [facility].

## 2016-03-16 ENCOUNTER — Encounter (HOSPITAL_COMMUNITY)
Admission: RE | Admit: 2016-03-16 | Discharge: 2016-03-16 | Disposition: A | Discharge status: Home / Self Care | Payer: Medicare Other | Source: Ambulatory Visit | Attending: Pulmonary Disease | Admitting: Pulmonary Disease

## 2016-03-16 VITALS — Wt 240.7 lb

## 2016-03-16 DIAGNOSIS — J438 Other emphysema: Secondary | ICD-10-CM

## 2016-03-16 DIAGNOSIS — J439 Emphysema, unspecified: Secondary | ICD-10-CM | POA: Diagnosis not present

## 2016-03-16 NOTE — Progress Notes (Signed)
Daily Session Note  Patient Details  Name: Steven Newman MRN: [id number] Date of Birth: May 12, 1941 Referring Provider:  Garlan Fair, MD  Encounter Date: 03/16/2016  Check In:     Session Check In - 03/16/16 1100    Check-In   Location [facility]   Staff Present Trish Fountain, RN, BSN;Lisa Ysidro Evert, RN;Olinty Emison, MS, ACSM CEP, Exercise Physiologist;Stormee Duda, MS, ACSM RCEP, Exercise Physiologist   Supervising physician immediately available to respond to emergencies [facility] immediately available   Physician(s) Dr. Marily Memos   Medication changes reported     No   Fall or balance concerns reported    No   Warm-up and Cool-down Performed as group-led instruction   Resistance Training Performed Yes   VAD Patient? No   Pain Assessment   Currently in Pain? No/denies   Multiple Pain Sites No      Capillary Blood Glucose: No results found for this or any previous visit (from the past 24 hour(s)).      Exercise Prescription Changes - 03/16/16 1200    Response to Exercise   Blood Pressure (Admit) 118/62 mmHg   Blood Pressure (Exercise) 138/64 mmHg   Blood Pressure (Exit) 106/62 mmHg   Heart Rate (Admit) 63 bpm   Heart Rate (Exercise) 75 bpm   Heart Rate (Exit) 67 bpm   Oxygen Saturation (Admit) 94 %   Oxygen Saturation (Exercise) 93 %   Oxygen Saturation (Exit) 95 %   Rating of Perceived Exertion (Exercise) 11   Perceived Dyspnea (Exercise) 1   Duration Progress to 45 minutes of aerobic exercise without signs/symptoms of physical distress   Intensity THRR unchanged   Progression   Progression Continue to progress workloads to maintain intensity without signs/symptoms of physical distress.   Resistance Training   Training Prescription Yes   Weight blue bands   Reps 10-12   Interval Training   Interval Training No   Treadmill   MPH 2   Grade 1   Minutes 15   Track   Laps 28   Minutes 30     Goals Met:  Using PLB without  cueing & demonstrates good technique No report of cardiac concerns or symptoms Strength training completed today  Goals Unmet:  Not Applicable  Comments: Service time is from 10:30am to 12:05pm    Dr. Rush Farmer is Medical Director for Pulmonary Rehab at [facility].

## 2016-03-18 ENCOUNTER — Encounter (HOSPITAL_COMMUNITY)
Admission: RE | Admit: 2016-03-18 | Discharge: 2016-03-18 | Disposition: A | Discharge status: Home / Self Care | Payer: Medicare Other | Source: Ambulatory Visit | Attending: Pulmonary Disease | Admitting: Pulmonary Disease

## 2016-03-18 VITALS — Wt 237.9 lb

## 2016-03-18 DIAGNOSIS — J438 Other emphysema: Secondary | ICD-10-CM

## 2016-03-18 DIAGNOSIS — J439 Emphysema, unspecified: Secondary | ICD-10-CM | POA: Diagnosis not present

## 2016-03-18 NOTE — Progress Notes (Signed)
Daily Session Note  Patient Details  Name: Steven Newman MRN: [id number] Date of Birth: 05/14/1941 Referring Provider:    Encounter Date: 03/18/2016  Check In:     Session Check In - 03/18/16 1055    Check-In   [facility]   Staff Present Su Hilt, MS, ACSM RCEP, Exercise Physiologist;Portia Rollene Rotunda, Therapist, sports, BSN;Ramon Dredge, RN, MHA;Maria Whitaker, RN, BSN   Supervising physician immediately available to respond to emergencies [facility] immediately available   Physician(s) Dr. Aggie Moats   Medication changes reported     No   Fall or balance concerns reported    No   Warm-up and Cool-down Performed as group-led instruction   Resistance Training Performed Yes   Pain Assessment   Currently in Pain? No/denies   Multiple Pain Sites No      Capillary Blood Glucose: No results found for this or any previous visit (from the past 24 hour(s)).      Exercise Prescription Changes - 03/18/16 1400    Exercise Review   Progression No   Response to Exercise   Blood Pressure (Admit) 110/64 mmHg   Blood Pressure (Exercise) 118/70 mmHg   Blood Pressure (Exit) 126/70 mmHg   Heart Rate (Admit) 62 bpm   Heart Rate (Exercise) 69 bpm   Heart Rate (Exit) 63 bpm   Oxygen Saturation (Admit) 95 %   Oxygen Saturation (Exercise) 96 %   Oxygen Saturation (Exit) 96 %   Rating of Perceived Exertion (Exercise) 9   Perceived Dyspnea (Exercise) 0   Duration Progress to 45 minutes of aerobic exercise without signs/symptoms of physical distress   Intensity THRR unchanged   Progression   Progression Continue to progress workloads to maintain intensity without signs/symptoms of physical distress.   Resistance Training   Training Prescription Yes   Weight blue bands   Reps 10-12   Interval Training   Interval Training No   Treadmill   MPH 2   Grade 1   Minutes 15   Track   Laps 15   Minutes 15     Goals Met:  Exercise tolerated well No report of  cardiac concerns or symptoms Strength training completed today  Goals Unmet:  Not Applicable  Comments: **Service time is from 1030 to 1215    Dr. Rush Farmer is Medical Director for Pulmonary Rehab at [facility].

## 2016-03-23 ENCOUNTER — Telehealth (HOSPITAL_COMMUNITY): Payer: Self-pay | Admitting: *Deleted

## 2016-03-23 ENCOUNTER — Encounter (HOSPITAL_COMMUNITY): Admission: RE | Admit: 2016-03-23 | Payer: Medicare Other | Source: Ambulatory Visit

## 2016-03-24 DIAGNOSIS — M545 Low back pain: Secondary | ICD-10-CM | POA: Diagnosis not present

## 2016-03-24 DIAGNOSIS — M9903 Segmental and somatic dysfunction of lumbar region: Secondary | ICD-10-CM | POA: Diagnosis not present

## 2016-03-24 DIAGNOSIS — M5416 Radiculopathy, lumbar region: Secondary | ICD-10-CM | POA: Diagnosis not present

## 2016-03-24 DIAGNOSIS — M9904 Segmental and somatic dysfunction of sacral region: Secondary | ICD-10-CM | POA: Diagnosis not present

## 2016-03-24 DIAGNOSIS — M791 Myalgia: Secondary | ICD-10-CM | POA: Diagnosis not present

## 2016-03-24 DIAGNOSIS — M9902 Segmental and somatic dysfunction of thoracic region: Secondary | ICD-10-CM | POA: Diagnosis not present

## 2016-03-24 DIAGNOSIS — M546 Pain in thoracic spine: Secondary | ICD-10-CM | POA: Diagnosis not present

## 2016-03-25 ENCOUNTER — Encounter (HOSPITAL_COMMUNITY): Payer: Medicare Other

## 2016-03-25 DIAGNOSIS — M5416 Radiculopathy, lumbar region: Secondary | ICD-10-CM | POA: Diagnosis not present

## 2016-03-25 DIAGNOSIS — M9904 Segmental and somatic dysfunction of sacral region: Secondary | ICD-10-CM | POA: Diagnosis not present

## 2016-03-25 DIAGNOSIS — M546 Pain in thoracic spine: Secondary | ICD-10-CM | POA: Diagnosis not present

## 2016-03-25 DIAGNOSIS — M9902 Segmental and somatic dysfunction of thoracic region: Secondary | ICD-10-CM | POA: Diagnosis not present

## 2016-03-25 DIAGNOSIS — M791 Myalgia: Secondary | ICD-10-CM | POA: Diagnosis not present

## 2016-03-25 DIAGNOSIS — M9903 Segmental and somatic dysfunction of lumbar region: Secondary | ICD-10-CM | POA: Diagnosis not present

## 2016-03-25 DIAGNOSIS — M545 Low back pain: Secondary | ICD-10-CM | POA: Diagnosis not present

## 2016-03-26 DIAGNOSIS — L570 Actinic keratosis: Secondary | ICD-10-CM | POA: Diagnosis not present

## 2016-03-26 DIAGNOSIS — Z85828 Personal history of other malignant neoplasm of skin: Secondary | ICD-10-CM | POA: Diagnosis not present

## 2016-03-26 DIAGNOSIS — D485 Neoplasm of uncertain behavior of skin: Secondary | ICD-10-CM | POA: Diagnosis not present

## 2016-03-29 DIAGNOSIS — M791 Myalgia: Secondary | ICD-10-CM | POA: Diagnosis not present

## 2016-03-29 DIAGNOSIS — M5416 Radiculopathy, lumbar region: Secondary | ICD-10-CM | POA: Diagnosis not present

## 2016-03-29 DIAGNOSIS — M545 Low back pain: Secondary | ICD-10-CM | POA: Diagnosis not present

## 2016-03-29 DIAGNOSIS — M9903 Segmental and somatic dysfunction of lumbar region: Secondary | ICD-10-CM | POA: Diagnosis not present

## 2016-03-29 DIAGNOSIS — M9904 Segmental and somatic dysfunction of sacral region: Secondary | ICD-10-CM | POA: Diagnosis not present

## 2016-03-29 DIAGNOSIS — M9902 Segmental and somatic dysfunction of thoracic region: Secondary | ICD-10-CM | POA: Diagnosis not present

## 2016-03-29 DIAGNOSIS — M546 Pain in thoracic spine: Secondary | ICD-10-CM | POA: Diagnosis not present

## 2016-03-30 ENCOUNTER — Encounter (HOSPITAL_COMMUNITY)
Admission: RE | Admit: 2016-03-30 | Discharge: 2016-03-30 | Disposition: A | Discharge status: Home / Self Care | Payer: Medicare Other | Source: Ambulatory Visit | Attending: Pulmonary Disease | Admitting: Pulmonary Disease

## 2016-03-30 VITALS — Wt 238.3 lb

## 2016-03-30 DIAGNOSIS — J438 Other emphysema: Secondary | ICD-10-CM

## 2016-03-30 DIAGNOSIS — J439 Emphysema, unspecified: Secondary | ICD-10-CM | POA: Diagnosis not present

## 2016-03-30 NOTE — Progress Notes (Signed)
Daily Session Note  Patient Details  Name: Steven Newman MRN: [id number] Date of Birth: Apr 16, 1941 Referring Provider:    Encounter Date: 03/30/2016  Check In:     Session Check In - 03/30/16 1244    Check-In   [facility]   Staff Present Su Hilt, MS, ACSM RCEP, Exercise Physiologist;Joan [place], RN, BSN;Lisa Hughes, RN;Joann Rion, RN, Luisa Hart, RN, BSN   Supervising physician immediately available to respond to emergencies [facility] immediately available   Physician(s) Dr. Marily Memos   Medication changes reported     No   Fall or balance concerns reported    No   Warm-up and Cool-down Performed as group-led instruction   Resistance Training Performed Yes   VAD Patient? No   Pain Assessment   Currently in Pain? No/denies   Multiple Pain Sites No      Capillary Blood Glucose: No results found for this or any previous visit (from the past 24 hour(s)).      Exercise Prescription Changes - 03/30/16 1200    Response to Exercise   Blood Pressure (Admit) 122/70 mmHg   Blood Pressure (Exercise) 130/70 mmHg   Blood Pressure (Exit) 112/56 mmHg   Heart Rate (Admit) 60 bpm   Heart Rate (Exercise) 71 bpm   Heart Rate (Exit) 63 bpm   Oxygen Saturation (Admit) 95 %   Oxygen Saturation (Exercise) 96 %   Oxygen Saturation (Exit) 93 %   Rating of Perceived Exertion (Exercise) 11   Perceived Dyspnea (Exercise) 0   Duration Progress to 45 minutes of aerobic exercise without signs/symptoms of physical distress   Intensity THRR unchanged   Progression   Progression Continue to progress workloads to maintain intensity without signs/symptoms of physical distress.   Resistance Training   Training Prescription Yes   Weight blue bands   Reps 10-12   Interval Training   Interval Training No   Track   Laps 26   Minutes 15     Goals Met:  Independence with exercise equipment Improved SOB with ADL's Exercise tolerated well Personal  goals reviewed No report of cardiac concerns or symptoms Strength training completed today  Goals Unmet:  Not Applicable  Comments: Service time is from 10:30am to 12:05pm    Dr. Rush Farmer is Medical Director for Pulmonary Rehab at [facility].

## 2016-04-01 ENCOUNTER — Encounter (HOSPITAL_COMMUNITY)
Admission: RE | Admit: 2016-04-01 | Discharge: 2016-04-01 | Disposition: A | Discharge status: Home / Self Care | Payer: Medicare Other | Source: Ambulatory Visit | Attending: Pulmonary Disease | Admitting: Pulmonary Disease

## 2016-04-01 VITALS — Wt 238.3 lb

## 2016-04-01 DIAGNOSIS — J438 Other emphysema: Secondary | ICD-10-CM

## 2016-04-01 DIAGNOSIS — J439 Emphysema, unspecified: Secondary | ICD-10-CM | POA: Diagnosis not present

## 2016-04-01 NOTE — Progress Notes (Signed)
Daily Session Note  Patient Details  Name: Steven Newman MRN: [id number] Date of Birth: 06/04/1941 Referring Provider:    Encounter Date: 04/01/2016  Check In:     Session Check In - 04/01/16 1226    Check-In   [facility]   Staff Present Rosebud Poles, RN, BSN;Lisa Ysidro Evert, RN;Portia Rollene Rotunda, RN, BSN;Ramon Dredge, RN, MHA;Molly diVincenzo, MS, ACSM RCEP, Exercise Physiologist   Supervising physician immediately available to respond to emergencies [facility] immediately available   Physician(s) Dr. Eliseo Squires   Medication changes reported     No   Fall or balance concerns reported    No   Warm-up and Cool-down Performed as group-led instruction   Resistance Training Performed Yes   VAD Patient? No   Pain Assessment   Currently in Pain? No/denies   Multiple Pain Sites No      Capillary Blood Glucose: No results found for this or any previous visit (from the past 24 hour(s)).   Goals Met:  Independence with exercise equipment Exercise tolerated well Strength training completed today  Goals Unmet:  Not Applicable  Comments: Service time is from 1030 to 1205    Dr. Rush Farmer is Medical Director for Pulmonary Rehab at [facility]

## 2016-04-02 DIAGNOSIS — L570 Actinic keratosis: Secondary | ICD-10-CM | POA: Diagnosis not present

## 2016-04-06 ENCOUNTER — Encounter (HOSPITAL_COMMUNITY)
Admission: RE | Admit: 2016-04-06 | Discharge: 2016-04-06 | Disposition: A | Discharge status: Home / Self Care | Payer: Medicare Other | Source: Ambulatory Visit | Attending: Pulmonary Disease | Admitting: Pulmonary Disease

## 2016-04-06 DIAGNOSIS — J439 Emphysema, unspecified: Secondary | ICD-10-CM | POA: Diagnosis not present

## 2016-04-06 NOTE — Progress Notes (Signed)
Daily Session Note  Patient Details  Name: Steven Newman MRN: [id number] Date of Birth: 14-Dec-1940 Referring Provider:    Encounter Date: 04/06/2016  Check In:     Session Check In - 04/06/16 1141    Check-In   [facility]   Staff Present Seward Carol, MS, ACSM CEP, Exercise Physiologist;Joan Leonia Reeves, RN, BSN;Molly diVincenzo, MS, ACSM RCEP, Exercise Physiologist;Portia Rollene Rotunda, RN, BSN   Supervising physician immediately available to respond to emergencies [facility] immediately available   Physician(s) Dr. Marily Memos   Medication changes reported     No   Fall or balance concerns reported    No   Warm-up and Cool-down Performed as group-led instruction   Resistance Training Performed Yes   VAD Patient? No   Pain Assessment   Currently in Pain? No/denies   Multiple Pain Sites No      Capillary Blood Glucose: No results found for this or any previous visit (from the past 24 hour(s)).      Exercise Prescription Changes - 04/06/16 1200    Response to Exercise   Blood Pressure (Admit) 110/56 mmHg   Blood Pressure (Exercise) 124/78 mmHg   Blood Pressure (Exit) 122/77 mmHg   Heart Rate (Admit) 59 bpm   Heart Rate (Exercise) 70 bpm   Heart Rate (Exit) 62 bpm   Oxygen Saturation (Admit) 94 %   Oxygen Saturation (Exercise) 92 %   Oxygen Saturation (Exit) 95 %   Rating of Perceived Exertion (Exercise) 11   Perceived Dyspnea (Exercise) 0   Symptoms calf muscle ache   Duration Progress to 45 minutes of aerobic exercise without signs/symptoms of physical distress   Intensity THRR unchanged   Progression   Progression Continue to progress workloads to maintain intensity without signs/symptoms of physical distress.   Resistance Training   Training Prescription Yes   Weight blue bands   Reps 10-12   Interval Training   Interval Training No   Treadmill   MPH 2   Grade 1   Minutes 15   Track   Laps 26   Minutes 30     Goals Met:   Independence with exercise equipment Using PLB without cueing & demonstrates good technique Exercise tolerated well No report of cardiac concerns or symptoms Strength training completed today  Goals Unmet:  Not Applicable  Comments: Service time is from 1030 to 1210    Dr. Rush Farmer is Medical Director for Pulmonary Rehab at [facility].

## 2016-04-08 ENCOUNTER — Encounter (HOSPITAL_COMMUNITY)
Admission: RE | Admit: 2016-04-08 | Discharge: 2016-04-08 | Disposition: A | Discharge status: Home / Self Care | Payer: Medicare Other | Source: Ambulatory Visit | Attending: Pulmonary Disease | Admitting: Pulmonary Disease

## 2016-04-08 DIAGNOSIS — J439 Emphysema, unspecified: Secondary | ICD-10-CM | POA: Diagnosis not present

## 2016-04-08 NOTE — Progress Notes (Signed)
Steven Newman completed a Six-Minute Walk Test on 04/08/16 . Steven Newman walked 1500 feet with 0 breaks.  The patient's lowest oxygen saturation was 93 %, highest heart rate was 78 bpm , and highest blood pressure was 158/78. The patient was on 0 liters of oxygen with a nasal cannula. Patient stated that his knees hindered their walk test.

## 2016-04-13 ENCOUNTER — Encounter (HOSPITAL_COMMUNITY): Payer: Medicare Other

## 2016-04-15 ENCOUNTER — Encounter (HOSPITAL_COMMUNITY): Payer: Medicare Other

## 2016-04-20 ENCOUNTER — Encounter (HOSPITAL_COMMUNITY): Payer: Medicare Other

## 2016-04-22 ENCOUNTER — Encounter (HOSPITAL_COMMUNITY): Payer: Medicare Other

## 2016-04-27 ENCOUNTER — Encounter (HOSPITAL_COMMUNITY): Payer: Medicare Other

## 2016-05-04 NOTE — Addendum Note (Signed)
Encounter addended by: Jewel Baize, RD on: 05/04/2016 12:40 PM<BR>     Documentation filed: Arn Medal VN

## 2016-05-12 ENCOUNTER — Other Ambulatory Visit: Payer: Self-pay | Admitting: *Deleted

## 2016-05-12 MED ORDER — UMECLIDINIUM-VILANTEROL 62.5-25 MCG/INH IN AEPB: 1.0000 | INHALATION_SPRAY | Freq: Every day | RESPIRATORY_TRACT | Status: DC

## 2016-06-24 ENCOUNTER — Encounter (INDEPENDENT_AMBULATORY_CARE_PROVIDER_SITE_OTHER): Payer: Self-pay

## 2016-06-24 ENCOUNTER — Encounter: Payer: Self-pay | Admitting: Pulmonary Disease

## 2016-06-24 ENCOUNTER — Ambulatory Visit (INDEPENDENT_AMBULATORY_CARE_PROVIDER_SITE_OTHER): Payer: Medicare Other | Admitting: Pulmonary Disease

## 2016-06-24 VITALS — BP 148/80 | HR 66 | Ht 69.5 in | Wt 235.0 lb

## 2016-06-24 DIAGNOSIS — J432 Centrilobular emphysema: Secondary | ICD-10-CM | POA: Diagnosis not present

## 2016-06-24 NOTE — Patient Instructions (Signed)
Try taking Stiolto 2 puffs daily instead of Anoro for a week Call me if he find that the [place] does not have the same side effects as Anoro I will see you back in 3 months or sooner if needed

## 2016-06-24 NOTE — Assessment & Plan Note (Signed)
This has been a stable interval for him with the exception of the side effect of dry mouth and throat soreness form this Anoro.  This was so bad that he had to stop taking the medication which puts him at risk considering the severity of his disease.    Plan: Trial of Stiolto instead of Anoro (samples given) If he does not have to throat soreness and pain with Stiolto we will change that medicine Continue pulmonary rehabilitation Follow-up 3 months

## 2016-06-24 NOTE — Progress Notes (Signed)
Subjective:    Patient ID: Steven Newman, male    DOB: Jul 13, 1941, 75 y.o.   MRN: [id number]  Synopsis: Came back to [place] [facility] in 2016 after previously being followed by Dr. Joya Gaskins and Dr. Elsworth Soho for COPD and obstructive sleep apnea. Has a lengthy smoking history where he smoked as much as 2 packs of cigarettes daily and quit in 2014.  He started back smoking briefly but he quit again in July 2016. Simple spirometry August 2016 showed clear airflow obstruction with an FEV1 of 1.3 L (42% predicted), FVC 1.7 L, 47% predicted  HPI  Chief Complaint  Patient presents with  . Follow-up    pt states he is doing well, notes that his anoro gives him a dry throat-states that this is tolerable but wants to make note of it.     Rayan says taht he is doing well.  He went to [place] in May and June and he had a lot of dryness in his throat burn a lot.  He stopped the Anoro during that time because this made his throat dryness worse.  This is better now.  He rinses his mouth frequently after using the Anoro and brushes his teeth.  He has not had to use his albuterol inhaler recently.    No flares of COPD since the last visit.    Past Medical History  Diagnosis Date  . Hx of colonic polyps   . Hyperlipidemia   . Erectile dysfunction   . Hypertension   . MI (myocardial infarction) ([place])     1976, 1985  . CAD (coronary artery disease)     coronary angioplasty 1995  . Obesity   . Pneumonia   . History of bladder cancer   . Skin cancer   . Kidney stones   . Dyspnea   . Wheezing   . Type 2 diabetes mellitus ([place])      Review of Systems  Constitutional: Positive for fatigue. Negative for fever and chills.  HENT: Negative for postnasal drip, rhinorrhea and sinus pressure.   Respiratory: Negative for cough, shortness of breath and wheezing.   Cardiovascular: Negative for chest pain, palpitations and leg swelling.       Objective:   Physical Exam Filed Vitals:   06/24/16 1123  BP:  148/80  Pulse: 66  Height: 5' 9.5" (1.765 m)  Weight: 235 lb (106.595 kg)  SpO2: 96%   room air   Gen: well appearing today HENT: OP clear, neck supple PULM: CTA B, normal percussion CV: RRR, no mgr, trace edema GI: BS+, soft, nontender Derm: no cyanosis or rash Psyche: normal mood and affect        Assessment & Plan:  COPD with emphysema ([place]) This has been a stable interval for him with the exception of the side effect of dry mouth and throat soreness form this Anoro.  This was so bad that he had to stop taking the medication which puts him at risk considering the severity of his disease.    Plan: Trial of Stiolto instead of Anoro (samples given) If he does not have to throat soreness and pain with Stiolto we will change that medicine Continue pulmonary rehabilitation Follow-up 3 months     Current outpatient prescriptions:  .  albuterol (PROVENTIL HFA;VENTOLIN HFA) 108 (90 Base) MCG/ACT inhaler, Inhale 2 puffs into the lungs every 6 (six) hours as needed for wheezing or shortness of breath., Disp: , Rfl:  .  albuterol (PROVENTIL) (2.5 MG/3ML) 0.083%  nebulizer solution, Take 3 mLs (2.5 mg total) by nebulization every 4 (four) hours as needed for shortness of breath., Disp: 75 mL, Rfl: 3 .  aspirin 81 MG tablet, Take 81 mg by mouth daily., Disp: , Rfl:  .  atorvastatin (LIPITOR) 40 MG tablet, Take 40 mg by mouth daily., Disp: , Rfl: 0 .  Cholecalciferol (VITAMIN D) 2000 UNITS tablet, Take 2,000 Units by mouth daily., Disp: , Rfl:  .  Coenzyme Q10 (CO Q-10) 100 MG CAPS, Take 1 capsule by mouth daily., Disp: , Rfl:  .  nadolol (CORGARD) 40 MG tablet, Take 40 mg by mouth daily., Disp: , Rfl:  .  nitroGLYCERIN (NITROSTAT) 0.4 MG SL tablet, Place 0.4 mg under the tongue every 5 (five) minutes as needed for chest pain., Disp: , Rfl:  .  Omega-3 Fatty Acids (RA FISH OIL) 1400 MG CPDR, Take by mouth daily., Disp: , Rfl:  .  PRESCRIPTION MEDICATION, Triflex for arthritis as needed,  Disp: , Rfl:  .  umeclidinium-vilanterol (ANORO ELLIPTA) 62.5-25 MCG/INH AEPB, Inhale 1 puff into the lungs daily., Disp: 30 each, Rfl: 5

## 2016-08-31 ENCOUNTER — Ambulatory Visit (HOSPITAL_BASED_OUTPATIENT_CLINIC_OR_DEPARTMENT_OTHER)
Admission: RE | Admit: 2016-08-31 | Discharge: 2016-08-31 | Disposition: A | Discharge status: Home / Self Care | Payer: Medicare Other | Source: Ambulatory Visit | Attending: Acute Care | Admitting: Acute Care

## 2016-08-31 DIAGNOSIS — Z122 Encounter for screening for malignant neoplasm of respiratory organs: Secondary | ICD-10-CM | POA: Diagnosis not present

## 2016-08-31 DIAGNOSIS — I2510 Atherosclerotic heart disease of native coronary artery without angina pectoris: Secondary | ICD-10-CM | POA: Insufficient documentation

## 2016-08-31 DIAGNOSIS — J439 Emphysema, unspecified: Secondary | ICD-10-CM | POA: Diagnosis not present

## 2016-08-31 DIAGNOSIS — I700 Atherosclerosis of aorta: Secondary | ICD-10-CM | POA: Diagnosis not present

## 2016-08-31 DIAGNOSIS — Z87891 Personal history of nicotine dependence: Secondary | ICD-10-CM | POA: Insufficient documentation

## 2016-08-31 IMAGING — CT CT CHEST LUNG CANCER SCREENING LOW DOSE W/O CM
2 of 4 series · 15 of 40 positions shown, 18 images · non-contrast
Comparison: 08/18/2015 screening chest CT.

CLINICAL DATA: 74-year-old asymptomatic male former smoker (quit 1
year prior) with 30 pack-year smoking history.

EXAM:
CT CHEST WITHOUT CONTRAST LOW-DOSE FOR LUNG CANCER SCREENING
TECHNIQUE: Multidetector CT imaging of the chest was performed following the
standard protocol without IV contrast.

[Series 2: axial st · axial · 0.75mm/px · z∈[-348,-82]mm · 12 of 59 slices shown, 15 images]
[im 3/59  mediastinal]
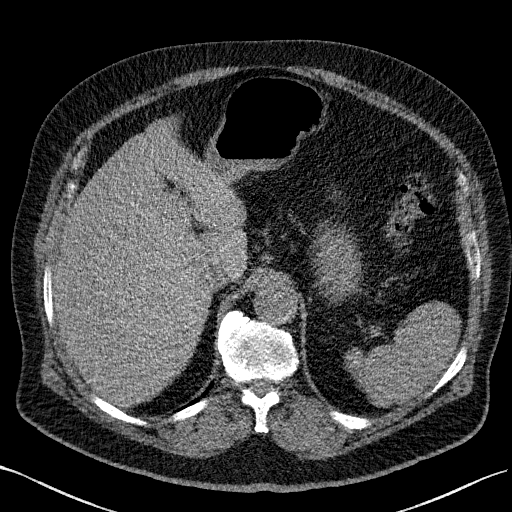
[im 3/59  lung]
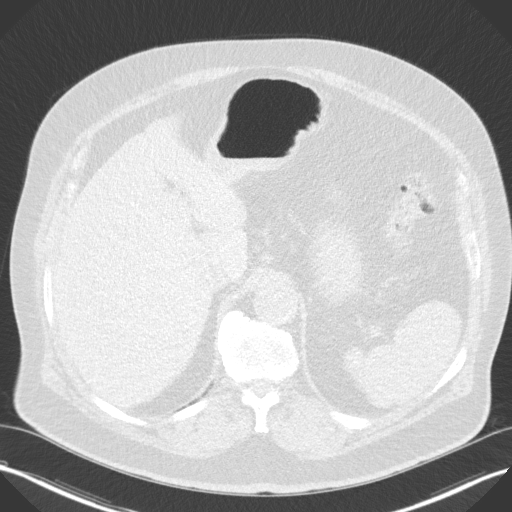
[im 8/59  lung]
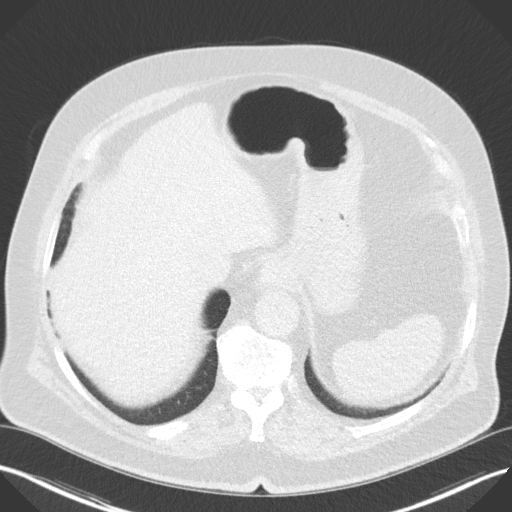
[im 13/59  lung]
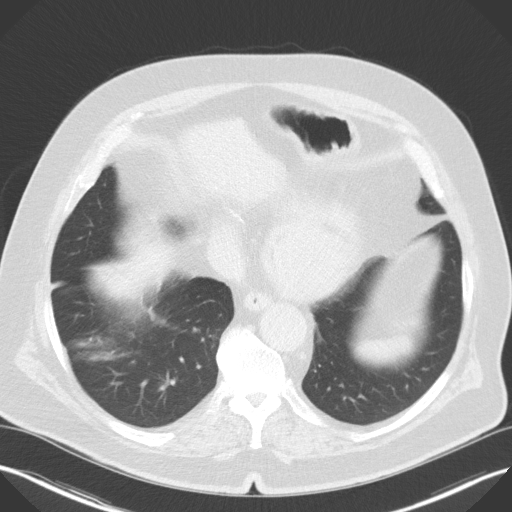
[im 18/59  lung]
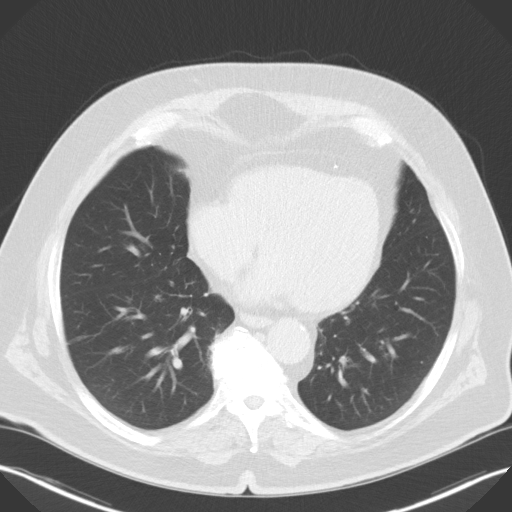
[im 23/59  mediastinal]
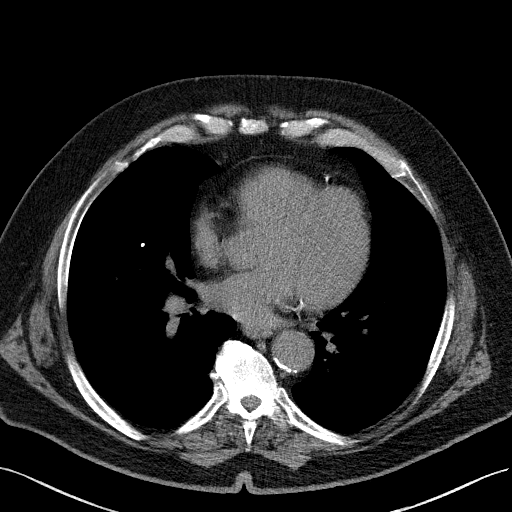
[im 23/59  lung]
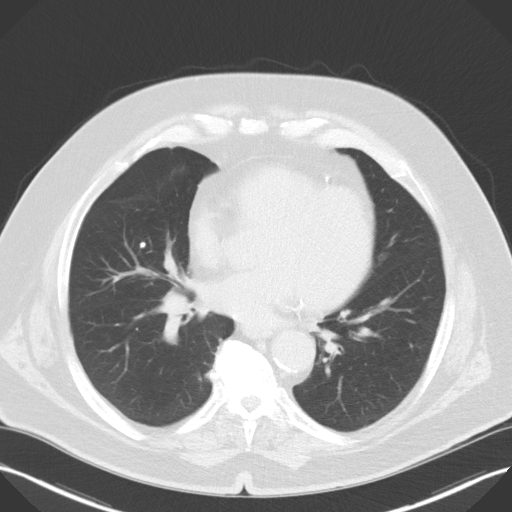
[im 28/59  lung]
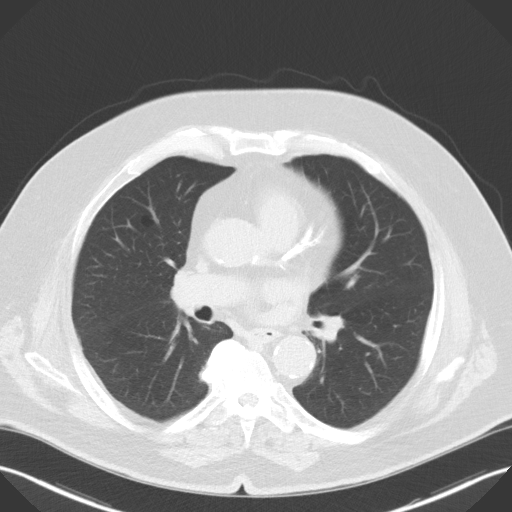
[im 31/59  lung]
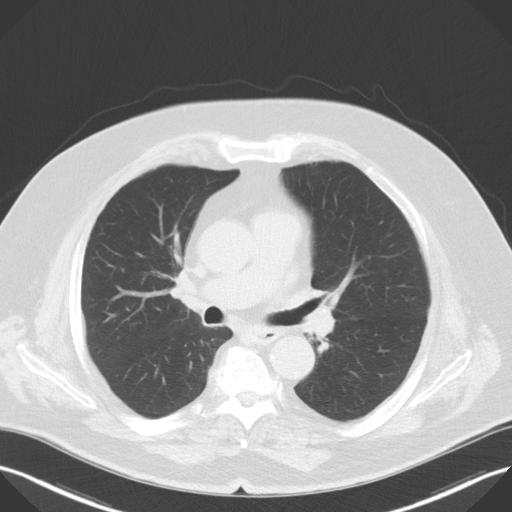
[im 36/59  lung]
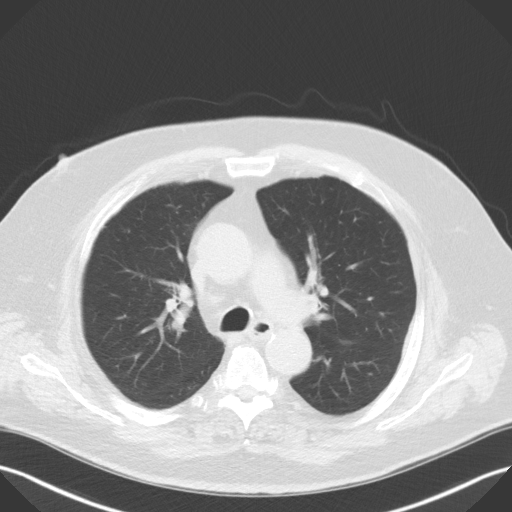
[im 41/59  mediastinal]
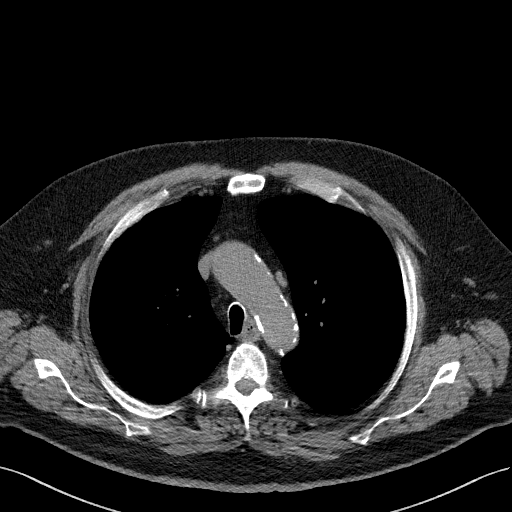
[im 41/59  lung]
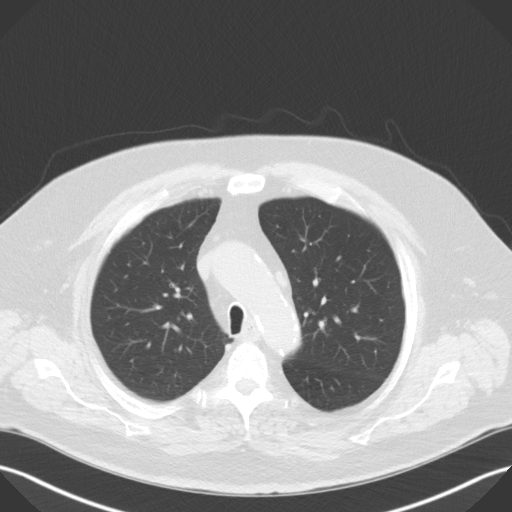
[im 46/59  lung]
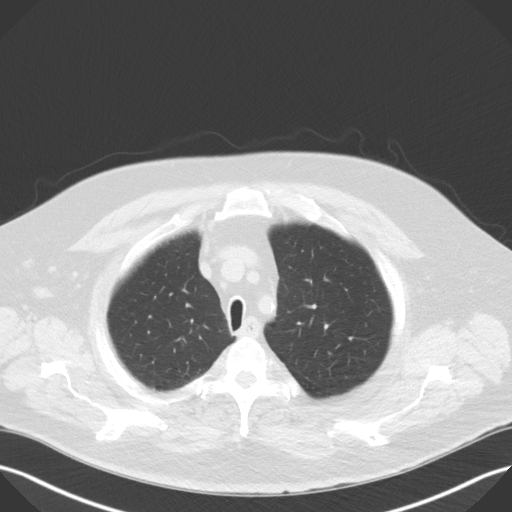
[im 51/59  lung]
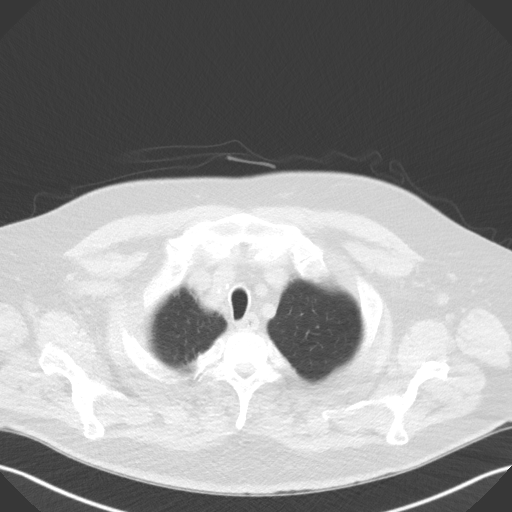
[im 56/59  lung]
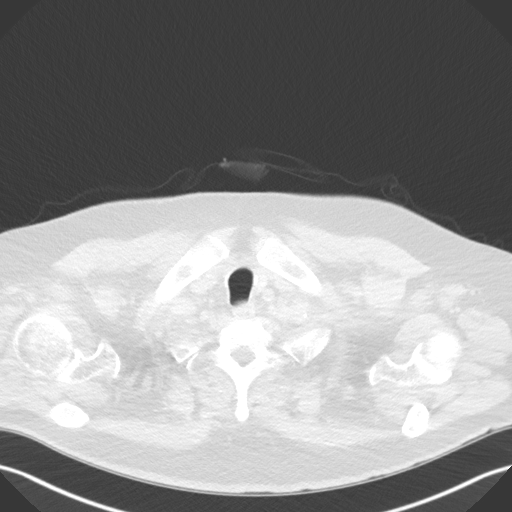

[Series 4: coronal · coronal · 0.64mm/px · 3 of 265 slices shown]
[im 53/265  lung]
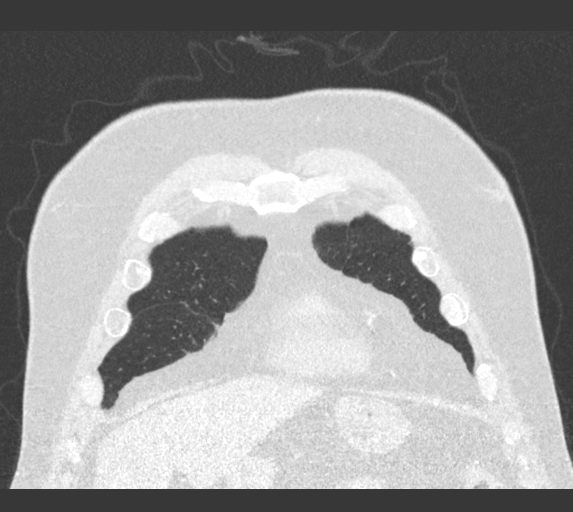
[im 106/265  lung]
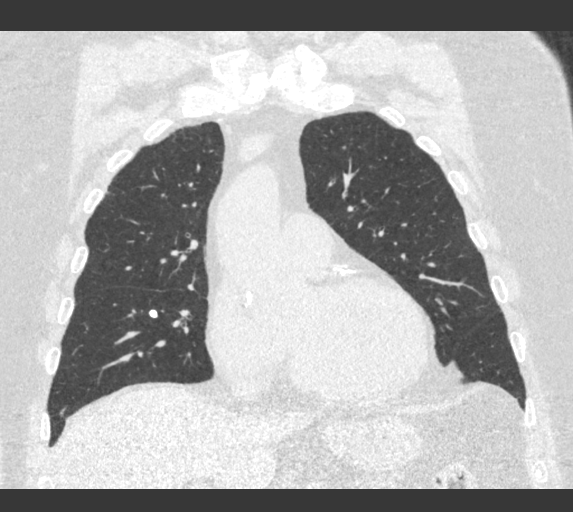
[im 159/265  lung]
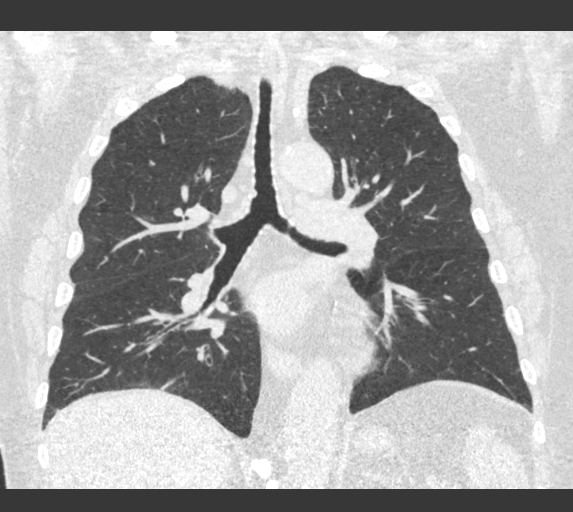

[15 of 40 positions shown; findings below may reference images not displayed]

FINDINGS: Cardiovascular: Normal heart size. No significant pericardial
fluid/thickening. Left main, left anterior descending, left
circumflex and right coronary atherosclerosis. Atherosclerotic
nonaneurysmal thoracic aorta. Normal caliber pulmonary arteries.

Mediastinum/Nodes: No discrete thyroid nodules. Unremarkable
esophagus. No pathologically enlarged axillary, mediastinal or gross
hilar lymph nodes, noting limited sensitivity for the detection of
hilar adenopathy on this noncontrast study. Coarsely calcified right
hilar nodes from prior granulomatous disease, unchanged.

Lungs/Pleura: No pneumothorax. No pleural effusion. Stable calcified
subcentimeter granulomas in both lungs. Mild centrilobular and
paraseptal emphysema with diffuse bronchial wall thickening. New
subpleural left lower lobe pulmonary nodule measuring 3.4 mm in
volume derived mean diameter (series 3/ image 180 ED). Previously
described tiny perifissural nodule in the basilar right upper lobe
measuring 2.8 mm in volume derived mean diameter (series 3/ image
168) is not significantly changed.

Upper abdomen: Unremarkable.

Musculoskeletal: No aggressive appearing focal osseous lesions.
Moderate thoracic spondylosis.
IMPRESSION: 1. Lung-RADS Category 2S, benign appearance or behavior. Continue
annual screening with low-dose chest CT without contrast in 12
months.
2. The "S" modifier above refers to potentially clinically
significant non lung cancer related findings. Specifically, left
main and 3 vessel coronary atherosclerosis.
3. Additional findings include aortic atherosclerosis and mild
emphysema.

## 2016-09-01 ENCOUNTER — Telehealth: Payer: Self-pay | Admitting: Acute Care

## 2016-09-01 DIAGNOSIS — Z87891 Personal history of nicotine dependence: Secondary | ICD-10-CM

## 2016-09-01 NOTE — Telephone Encounter (Signed)
I have called [name] with the results of his low-dose CT screening scan. Spine to him that his scan was read as a lung RADS 2, indicating nodules that are benign in appearance and behavior. I also let him know that there was a finding of three-vessel coronary atherosclerosis and aortic atherosclerosis in addition to mild emphysema. He and I had discussed at his shared decision making visit that atherosclerosis was a finding that we were noting high incidence of in the scans. I explained to him that this is a non-gated exam and can tell presence of calcification but cannot tell degree or severity. He is currently taking Lipitor per his primary care provider, and has his cholesterol checked by his primary care physician annually. I told him we will order and schedule his scan for September 2018. He verbalized understanding of these results, and had no further questions upon completion of the call. He does have my contact information in the event that he has questions in the future.

## 2016-09-13 ENCOUNTER — Ambulatory Visit (INDEPENDENT_AMBULATORY_CARE_PROVIDER_SITE_OTHER): Payer: Medicare Other | Admitting: Pulmonary Disease

## 2016-09-13 ENCOUNTER — Encounter: Payer: Self-pay | Admitting: Pulmonary Disease

## 2016-09-13 DIAGNOSIS — J432 Centrilobular emphysema: Secondary | ICD-10-CM | POA: Diagnosis not present

## 2016-09-13 DIAGNOSIS — Z23 Encounter for immunization: Secondary | ICD-10-CM | POA: Diagnosis not present

## 2016-09-13 MED ORDER — INDACATEROL-GLYCOPYRROLATE 27.5-15.6 MCG IN CAPS: 1.0000 | ORAL_CAPSULE | Freq: Every day | RESPIRATORY_TRACT | 0 refills | Status: DC

## 2016-09-13 NOTE — Assessment & Plan Note (Signed)
This has been a stable interval for him. However, he has been intolerant of 2 separate inhaled medicines. Specifically Stiolto cause excessive dry mouth, and Anoro has been associated with high blood sugar readings.  Plan: Flu shot today Trial of Ubiteron (indacaterol/glycopyrolate) 1 puff daily, samples given, he will call for an Rx if it is working well F/u 6 months.

## 2016-09-13 NOTE — Progress Notes (Signed)
Subjective:    Patient ID: Steven Newman, male    DOB: [birth year], 75 y.o.   MRN: [id number]  Synopsis: Came back to [facility] in 2016 after previously being followed by Dr. Joya Gaskins and Dr. Elsworth Soho for COPD and obstructive sleep apnea. Has a lengthy smoking history where he smoked as much as 2 packs of cigarettes daily and quit in 2014.  He started back smoking briefly but he quit again in July 2016. Simple spirometry August 2016 showed clear airflow obstruction with an FEV1 of 1.3 L (42% predicted), FVC 1.7 L, 47% predicted  HPI  Chief Complaint  Patient presents with  . Follow-up    pt states he is doing well, only complaints is occasional cough worsened by [facility] unit.    No major respiratory problems since the last visit.  He hasn't had a flare of is COPD. His breathing has been OK lately.  He notes that the Anoro is associated with higher blood sugar readings consistently when he takes it. He has seen this occur repeatedly.  He has tried taking it for 4-5 times and consistently he notes his fasting blood sugar rises to 160+ while taking the medicine for several days, then goes down when he stops it.  Past Medical History:  Diagnosis Date  . CAD (coronary artery disease)    coronary angioplasty 1995  . Dyspnea   . Erectile dysfunction   . History of bladder cancer   . Hx of colonic polyps   . Hyperlipidemia   . Hypertension   . Kidney stones   . MI (myocardial infarction)    1976, 1985  . Obesity   . Pneumonia   . Skin cancer   . Type 2 diabetes mellitus ([place])   . Wheezing      Review of Systems  Constitutional: Positive for fatigue. Negative for chills and fever.  HENT: Negative for postnasal drip, rhinorrhea and sinus pressure.   Respiratory: Negative for cough, shortness of breath and wheezing.   Cardiovascular: Negative for chest pain, palpitations and leg swelling.       Objective:   Physical Exam Vitals:   09/13/16 0954  BP: 136/74  Pulse: 67  SpO2: 95%    Weight: 237 lb (107.5 kg)  Height: 5' 9.5" (1.765 m)   room air   Gen: well appearing today HENT: OP clear, neck supple PULM: CTA B, normal percussion CV: RRR, no mgr, trace edema GI: BS+, soft, nontender Derm: no cyanosis or rash Psyche: normal mood and affect        Assessment & Plan:  COPD with emphysema ([place]) This has been a stable interval for him. However, he has been intolerant of 2 separate inhaled medicines. Specifically Stiolto cause excessive dry mouth, and Anoro has been associated with high blood sugar readings.  Plan: Flu shot today Trial of Ubiteron (indacaterol/glycopyrolate) 1 puff daily, samples given, he will call for an Rx if it is working well F/u 6 months.    Current Outpatient Prescriptions:  .  albuterol (PROVENTIL HFA;VENTOLIN HFA) 108 (90 Base) MCG/ACT inhaler, Inhale 2 puffs into the lungs every 6 (six) hours as needed for wheezing or shortness of breath., Disp: , Rfl:  .  albuterol (PROVENTIL) (2.5 MG/3ML) 0.083% nebulizer solution, Take 3 mLs (2.5 mg total) by nebulization every 4 (four) hours as needed for shortness of breath., Disp: 75 mL, Rfl: 3 .  aspirin 81 MG tablet, Take 81 mg by mouth daily., Disp: , Rfl:  .  atorvastatin (LIPITOR) 40 MG tablet, Take 40 mg by mouth daily., Disp: , Rfl: 0 .  Cholecalciferol (VITAMIN D) 2000 UNITS tablet, Take 2,000 Units by mouth daily., Disp: , Rfl:  .  Coenzyme Q10 (CO Q-10) 100 MG CAPS, Take 1 capsule by mouth daily., Disp: , Rfl:  .  nadolol (CORGARD) 40 MG tablet, Take 40 mg by mouth daily., Disp: , Rfl:  .  nitroGLYCERIN (NITROSTAT) 0.4 MG SL tablet, Place 0.4 mg under the tongue every 5 (five) minutes as needed for chest pain., Disp: , Rfl:  .  Omega-3 Fatty Acids (RA FISH OIL) 1400 MG CPDR, Take by mouth daily., Disp: , Rfl:  .  PRESCRIPTION MEDICATION, Triflex for arthritis as needed, Disp: , Rfl:  .  Indacaterol-Glycopyrrolate (UTIBRON NEOHALER) 27.5-15.6 MCG CAPS, Place 1 puff into inhaler and  inhale daily., Disp: 2 capsule, Rfl: 0 .  umeclidinium-vilanterol (ANORO ELLIPTA) 62.5-25 MCG/INH AEPB, Inhale 1 puff into the lungs daily. (Patient not taking: Reported on 09/13/2016), Disp: 30 each, Rfl: 5

## 2016-09-13 NOTE — Patient Instructions (Signed)
Try taking the Utibron 1 puff daily, call us in a few weeks to let us know if it is helpful and we will give you a prescription. We will see you back in 6 months or sooner if needed

## 2016-09-15 DIAGNOSIS — Z86010 Personal history of colonic polyps: Secondary | ICD-10-CM | POA: Diagnosis not present

## 2016-09-15 DIAGNOSIS — I10 Essential (primary) hypertension: Secondary | ICD-10-CM | POA: Diagnosis not present

## 2016-09-15 DIAGNOSIS — E7800 Pure hypercholesterolemia, unspecified: Secondary | ICD-10-CM | POA: Diagnosis not present

## 2016-09-15 DIAGNOSIS — E119 Type 2 diabetes mellitus without complications: Secondary | ICD-10-CM | POA: Diagnosis not present

## 2016-09-15 DIAGNOSIS — R809 Proteinuria, unspecified: Secondary | ICD-10-CM | POA: Diagnosis not present

## 2016-09-15 DIAGNOSIS — G4733 Obstructive sleep apnea (adult) (pediatric): Secondary | ICD-10-CM | POA: Diagnosis not present

## 2016-09-15 DIAGNOSIS — M150 Primary generalized (osteo)arthritis: Secondary | ICD-10-CM | POA: Diagnosis not present

## 2016-09-15 DIAGNOSIS — Z0001 Encounter for general adult medical examination with abnormal findings: Secondary | ICD-10-CM | POA: Diagnosis not present

## 2016-09-15 DIAGNOSIS — J449 Chronic obstructive pulmonary disease, unspecified: Secondary | ICD-10-CM | POA: Diagnosis not present

## 2016-09-15 DIAGNOSIS — I2510 Atherosclerotic heart disease of native coronary artery without angina pectoris: Secondary | ICD-10-CM | POA: Diagnosis not present

## 2016-09-30 DIAGNOSIS — E119 Type 2 diabetes mellitus without complications: Secondary | ICD-10-CM | POA: Diagnosis not present

## 2016-09-30 DIAGNOSIS — Z79899 Other long term (current) drug therapy: Secondary | ICD-10-CM | POA: Diagnosis not present

## 2016-10-05 ENCOUNTER — Encounter: Payer: Self-pay | Admitting: Interventional Cardiology

## 2016-10-19 ENCOUNTER — Ambulatory Visit (INDEPENDENT_AMBULATORY_CARE_PROVIDER_SITE_OTHER): Payer: Medicare Other | Admitting: Interventional Cardiology

## 2016-10-19 ENCOUNTER — Encounter: Payer: Self-pay | Admitting: Interventional Cardiology

## 2016-10-19 ENCOUNTER — Encounter (INDEPENDENT_AMBULATORY_CARE_PROVIDER_SITE_OTHER): Payer: Self-pay

## 2016-10-19 VITALS — BP 150/96 | HR 65 | Ht 69.5 in | Wt 235.8 lb

## 2016-10-19 DIAGNOSIS — E782 Mixed hyperlipidemia: Secondary | ICD-10-CM

## 2016-10-19 DIAGNOSIS — I252 Old myocardial infarction: Secondary | ICD-10-CM | POA: Diagnosis not present

## 2016-10-19 DIAGNOSIS — I10 Essential (primary) hypertension: Secondary | ICD-10-CM | POA: Diagnosis not present

## 2016-10-19 DIAGNOSIS — I714 Abdominal aortic aneurysm, without rupture, unspecified: Secondary | ICD-10-CM

## 2016-10-19 DIAGNOSIS — I2510 Atherosclerotic heart disease of native coronary artery without angina pectoris: Secondary | ICD-10-CM

## 2016-10-19 NOTE — Progress Notes (Signed)
Cardiology Office Note   Date:  10/19/2016   ID:  Steven Newman, DOB 26-Feb-1941, MRN [id number]  PCP:  Garlan Fair, MD    No chief complaint on file. CAD/MI   Wt Readings from Last 3 Encounters:  10/19/16 107 kg (235 lb 12.8 oz)  09/13/16 107.5 kg (237 lb)  06/24/16 106.6 kg (235 lb)       History of Present Illness: Steven Newman is a 75 y.o. male  with CAD with several MIs in the past and a PTCA in the mid 90s. His SOB persists. He has had a cold and BP has been higher. Just finished antibiotic. He had a reaction to penicillin, augmentin-rash. Now on cough medicine. He has not had any sx like he had before his MI. At that time, he felt indigestion with arm pain. He has had some back pain but this is better. Checking BP at home regularly- every 2 weeks. He had angioedema with ACE-I in the past Quit cigarettes for over a year.  No NTG use.    BP has been controlled in Dr. Durenda Age office.  He has not been checking regularly at home.    Past Medical History:  Diagnosis Date  . CAD (coronary artery disease)    coronary angioplasty 1995  . Dyspnea   . Erectile dysfunction   . History of bladder cancer   . Hx of colonic polyps   . Hyperlipidemia   . Hypertension   . Kidney stones   . MI (myocardial infarction)    1976, 1985  . Obesity   . Pneumonia   . Skin cancer   . Type 2 diabetes mellitus ([place])   . Wheezing     Past Surgical History:  Procedure Laterality Date  . bladder cancer surgery    . 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 neck tumor       Current Outpatient Prescriptions  Medication Sig Dispense Refill  . albuterol (PROVENTIL HFA;VENTOLIN HFA) 108 (90 Base) MCG/ACT inhaler Inhale 2 puffs into the lungs every 6 (six) hours as needed for wheezing or shortness of breath.    Marland Kitchen albuterol (PROVENTIL) (2.5 MG/3ML) 0.083% nebulizer solution Take 3 mLs (2.5 mg total) by nebulization every 4 (four) hours as needed for shortness of breath. 75 mL 3  .  aspirin 81 MG tablet Take 81 mg by mouth daily.    Marland Kitchen atorvastatin (LIPITOR) 40 MG tablet Take 40 mg by mouth daily.  0  . Cholecalciferol (VITAMIN D) 2000 UNITS tablet Take 2,000 Units by mouth daily.    . Coenzyme Q10 (CO Q-10) 100 MG CAPS Take 1 capsule by mouth daily.    . Indacaterol-Glycopyrrolate (UTIBRON NEOHALER) 27.5-15.6 MCG CAPS Place 1 puff into inhaler and inhale daily. 2 capsule 0  . nadolol (CORGARD) 40 MG tablet Take 40 mg by mouth daily.    . nitroGLYCERIN (NITROSTAT) 0.4 MG SL tablet Place 0.4 mg under the tongue every 5 (five) minutes as needed for chest pain.    . Omega-3 Fatty Acids (RA FISH OIL) 1400 MG CPDR Take 1 capsule by mouth daily.     Marland Kitchen PRESCRIPTION MEDICATION Take 1 tablet by mouth daily. Triflex for arthritis as needed      No current facility-administered medications for this visit.     Allergies:   Ace inhibitors and Penicillins    Social History:  The patient  reports that he quit smoking about 16 months ago. His smoking use  included Cigarettes. He has a 100.00 pack-year smoking history. He has never used smokeless tobacco. He reports that he drinks alcohol. He reports that he does not use drugs.   Family History:  The patient's family history includes Cancer in his sister; Diabetes in his sister; Heart disease in his mother; Rheumatic fever in his brother and father.    ROS:  Please see the history of present illness.   Otherwise, review of systems are positive for rare swelling.   All other systems are reviewed and negative.    PHYSICAL EXAM: VS:  BP (!) 150/96   Pulse 65   Ht 5' 9.5" (1.765 m)   Wt 107 kg (235 lb 12.8 oz)   BMI 34.32 kg/m  , BMI Body mass index is 34.32 kg/m. GEN: Well nourished, well developed, in no acute distress  HEENT: normal  Neck: no JVD, carotid bruits, or masses Cardiac: RRR; no murmurs, rubs, or gallops,no edema  Respiratory:  clear to auscultation bilaterally, normal work of breathing GI: soft, nontender,  nondistended, + BS MS: no deformity or atrophy  Skin: warm and dry, no rash Neuro:  Strength and sensation are intact Psych: euthymic mood, full affect   EKG:   The ekg ordered today demonstrates NSR, inferior Q waves, QRS widening   Recent Labs: No results found for requested labs within last 8760 hours.   Lipid Panel    Component Value Date/Time   CHOL 159 12/23/2014 0923   TRIG 129 12/23/2014 0923   HDL 45 12/23/2014 0923   LDLCALC 88 12/23/2014 0923     Other studies Reviewed: Additional studies/ records that were reviewed today with results demonstrating: small AAA in 2016.   ASSESSMENT AND PLAN:  1. CAD/MI:  No angina.  COntinue aggressive secondary prevention.    No sx like what he had before MI. PTCA in 1995. No CHF sx.  2. AAA: Recheck abdominal u/s to eval AAA.   3. Hyperlipidemia:  COntinue statin.   4. SHOB: DOE is chronic.  He has stopped smoking.  Try to exercise as outlined below.     Current medicines are reviewed at length with the patient today.  The patient concerns regarding his medicines were addressed.  The following changes have been made:  No change  Labs/ tests ordered today include:  No orders of the defined types were placed in this encounter.   Recommend 150 minutes/week of aerobic exercise Low fat, low carb, high fiber diet recommended  Disposition:   FU in 1 year   Signed, Larae Grooms, MD  10/19/2016 1:44 PM    [facility] [address], [place], [place]  [address] Phone: [phone]; Fax: [phone]

## 2016-10-19 NOTE — Patient Instructions (Signed)
**Note De-Identified  Obfuscation** Medication Instructions:  Same-no changes  Labwork: None  Testing/Procedures: Your provider recommending that you have a AAA Duplex. We Will arrange.  Follow-Up: Your physician wants you to follow-up in: 1 year. You will receive a reminder letter in the mail two months in advance. If you don't receive a letter, please call our office to schedule the follow-up appointment.     If you need a refill on your cardiac medications before your next appointment, please call your pharmacy.

## 2016-12-21 ENCOUNTER — Ambulatory Visit (HOSPITAL_COMMUNITY)
Admission: RE | Admit: 2016-12-21 | Discharge: 2016-12-21 | Disposition: A | Discharge status: Home / Self Care | Payer: Medicare Other | Source: Ambulatory Visit | Attending: Cardiology | Admitting: Cardiology

## 2016-12-21 DIAGNOSIS — I714 Abdominal aortic aneurysm, without rupture, unspecified: Secondary | ICD-10-CM

## 2016-12-21 DIAGNOSIS — I771 Stricture of artery: Secondary | ICD-10-CM | POA: Insufficient documentation

## 2016-12-21 DIAGNOSIS — I700 Atherosclerosis of aorta: Secondary | ICD-10-CM | POA: Diagnosis not present

## 2016-12-30 ENCOUNTER — Telehealth: Payer: Self-pay | Admitting: Interventional Cardiology

## 2016-12-30 NOTE — Telephone Encounter (Signed)
**Note De-Identified  Obfuscation** The pt and his wife have been given the pots AAA duplex results. They both verbalized understanding.

## 2016-12-30 NOTE — Telephone Encounter (Signed)
New Message    Per pt wife was told someone be contacting them about pt test results. Requesting a call back.

## 2017-01-05 ENCOUNTER — Telehealth: Payer: Self-pay | Admitting: Interventional Cardiology

## 2017-01-05 NOTE — Telephone Encounter (Signed)
Steven Newman is calling to find out about a referral to a Vascular Doctor . Marland Kitchen Please call

## 2017-01-05 NOTE — Telephone Encounter (Signed)
Returned call to patient's wife.She stated husband has not received PV appointment.Advised I will send message to schedulers to schedule PV consult.

## 2017-01-18 ENCOUNTER — Ambulatory Visit: Payer: Medicare Other | Admitting: Cardiovascular Disease

## 2017-01-21 ENCOUNTER — Encounter: Payer: Self-pay | Admitting: *Deleted

## 2017-01-24 DIAGNOSIS — H04123 Dry eye syndrome of bilateral lacrimal glands: Secondary | ICD-10-CM | POA: Diagnosis not present

## 2017-01-24 DIAGNOSIS — H04201 Unspecified epiphora, right lacrimal gland: Secondary | ICD-10-CM | POA: Diagnosis not present

## 2017-01-24 DIAGNOSIS — Z961 Presence of intraocular lens: Secondary | ICD-10-CM | POA: Diagnosis not present

## 2017-01-25 ENCOUNTER — Encounter: Payer: Self-pay | Admitting: Cardiovascular Disease

## 2017-01-25 ENCOUNTER — Ambulatory Visit (INDEPENDENT_AMBULATORY_CARE_PROVIDER_SITE_OTHER): Payer: Medicare Other | Admitting: Cardiovascular Disease

## 2017-01-25 VITALS — BP 140/82 | HR 60 | Ht 69.5 in | Wt 234.0 lb

## 2017-01-25 DIAGNOSIS — I739 Peripheral vascular disease, unspecified: Secondary | ICD-10-CM | POA: Diagnosis not present

## 2017-01-25 DIAGNOSIS — I714 Abdominal aortic aneurysm, without rupture, unspecified: Secondary | ICD-10-CM

## 2017-01-25 DIAGNOSIS — E782 Mixed hyperlipidemia: Secondary | ICD-10-CM

## 2017-01-25 NOTE — Patient Instructions (Signed)
Medication Instructions:  Your physician recommends that you continue on your current medications as directed. Please refer to the Current Medication list given to you today.  Labwork: No new orders.   Testing/Procedures: Your physician has requested that you have an aorto-iliac duplex in 1 YEAR. During this test, an ultrasound is used to evaluate the aorta and iliac arteries. Do not eat after midnight the day before and avoid carbonated beverages  Follow-Up: Your physician wants you to follow-up in: 1 YEAR with Dr Fletcher Anon.  You will receive a reminder letter in the mail two months in advance. If you don't receive a letter, please call our office to schedule the follow-up appointment.   Any Other Special Instructions Will Be Listed Below (If Applicable).     If you need a refill on your cardiac medications before your next appointment, please call your pharmacy.

## 2017-01-25 NOTE — Progress Notes (Signed)
Cardiology Office Note   Date:  01/25/2017   ID:  Steven Newman, DOB 1941/02/25, MRN [id number]  PCP:  Garlan Fair, MD  Cardiologist:  Dr. Irish Lack  Chief Complaint  Patient presents with  . New Patient (Initial Visit)      History of Present Illness: Steven Newman is a 76 y.o. male who Was referred by Dr. Irish Lack for evaluation and management of peripheral arterial disease.  He has known history of coronary artery disease status post MI and multiple PCI in the mid 90s. No recent revascularization. He is a previous smoker and quit in 2016. Other chronic medical conditions include hypertension, hyperlipidemia and obesity. He is known to have abdominal aortic ectasia which has been followed by ultrasound. He had an ultrasound recently which showed stable that mentioned at 2.8 x 2.9 cm. However, she was found to have significant stenosis affecting the left external iliac artery. He reports very mild bilateral calf pain with walking which is equal in both sides and only happens he walks a very long distance. He has no lower extremity wounds or ulcerations.    Past Medical History:  Diagnosis Date  . CAD (coronary artery disease)    coronary angioplasty 1995  . Dyspnea   . Erectile dysfunction   . History of bladder cancer   . Hx of colonic polyps   . Hyperlipidemia   . Hypertension   . Kidney stones   . MI (myocardial infarction)    1976, 1985  . Obesity   . Pneumonia   . Skin cancer   . Type 2 diabetes mellitus ([place])   . Wheezing     Past Surgical History:  Procedure Laterality Date  . bladder cancer surgery    . 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 neck tumor       Current Outpatient Prescriptions  Medication Sig Dispense Refill  . albuterol (PROVENTIL HFA;VENTOLIN HFA) 108 (90 Base) MCG/ACT inhaler Inhale 2 puffs into the lungs every 6 (six) hours as needed for wheezing or shortness of breath.    Marland Kitchen albuterol (PROVENTIL) (2.5 MG/3ML) 0.083% nebulizer  solution Take 3 mLs (2.5 mg total) by nebulization every 4 (four) hours as needed for shortness of breath. 75 mL 3  . aspirin 81 MG tablet Take 81 mg by mouth daily.    Marland Kitchen atorvastatin (LIPITOR) 40 MG tablet Take 40 mg by mouth daily.  0  . Cholecalciferol (VITAMIN D) 2000 UNITS tablet Take 2,000 Units by mouth daily.    . Coenzyme Q10 (CO Q-10) 100 MG CAPS Take 1 capsule by mouth daily.    . Indacaterol-Glycopyrrolate (UTIBRON NEOHALER) 27.5-15.6 MCG CAPS Place 1 puff into inhaler and inhale daily. 2 capsule 0  . nadolol (CORGARD) 40 MG tablet Take 40 mg by mouth daily.    . nitroGLYCERIN (NITROSTAT) 0.4 MG SL tablet Place 0.4 mg under the tongue every 5 (five) minutes as needed for chest pain.    . Omega-3 Fatty Acids (RA FISH OIL) 1400 MG CPDR Take 1 capsule by mouth daily.     Marland Kitchen PRESCRIPTION MEDICATION Take 1 tablet by mouth daily. Triflex for arthritis as needed      No current facility-administered medications for this visit.     Allergies:   Ace inhibitors and Penicillins    Social History:  The patient  reports that he quit smoking about 19 months ago. His smoking use included Cigarettes. He has a 100.00 pack-year smoking history. He has  never used smokeless tobacco. He reports that he drinks alcohol. He reports that he does not use drugs.   Family History:  The patient's family history includes Cancer in his sister; Diabetes in his sister; Heart disease in his mother; Rheumatic fever in his brother and father.    ROS:  Please see the history of present illness.   Otherwise, review of systems are positive for none.   All other systems are reviewed and negative.    PHYSICAL EXAM: VS:  BP 140/82   Pulse 60   Ht 5' 9.5" (1.765 m)   Wt 234 lb (106.1 kg)   BMI 34.06 kg/m  , BMI Body mass index is 34.06 kg/m. GEN: Well nourished, well developed, in no acute distress  HEENT: normal  Neck: no JVD, carotid bruits, or masses Cardiac: RRR; no murmurs, rubs, or gallops,no edema    Respiratory:  clear to auscultation bilaterally, normal work of breathing GI: soft, nontender, nondistended, + BS MS: no deformity or atrophy  Skin: warm and dry, no rash Neuro:  Strength and sensation are intact Psych: euthymic mood, full affect Vascular: Femoral pulses +2 on the right and +1 on the left. Posterior tibial is +2 on the right and +1 on the left. Dorsalis pedis is not palpable.   EKG:  EKG is not ordered today.    Recent Labs: No results found for requested labs within last 8760 hours.    Lipid Panel    Component Value Date/Time   CHOL 159 12/23/2014 0923   TRIG 129 12/23/2014 0923   HDL 45 12/23/2014 0923   LDLCALC 88 12/23/2014 0923      Wt Readings from Last 3 Encounters:  01/25/17 234 lb (106.1 kg)  10/19/16 235 lb 12.8 oz (107 kg)  09/13/16 237 lb (107.5 kg)       PAD Screen 01/25/2017  Previous PAD dx? No  Previous surgical procedure? Yes  Dates of procedures hx of bladder cancer  Pain with walking? Yes  Subsides with rest? Yes  Feet/toe relief with dangling? No  Painful, non-healing ulcers? No  Extremities discolored? Yes      ASSESSMENT AND PLAN:  1.  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 Asymptomatic. Will follow-up with repeat ultrasound in 1 year.  2. Peripheral arterial disease: He has significant left external iliac artery stenosis. However, he has no lifestyle limiting claudication and no evidence of critical limb ischemia. Thus, I recommend continuing medical therapy. Repeat duplex in 1 year.  3. Hyperlipidemia: Continue treatment with atorvastatin with a target LDL of less than 70.   Disposition:   FU with me in 1 year  Signed,  Kathlyn Sacramento, MD  01/25/2017 9:30 AM    [place]

## 2017-01-27 DIAGNOSIS — M150 Primary generalized (osteo)arthritis: Secondary | ICD-10-CM | POA: Diagnosis not present

## 2017-01-27 DIAGNOSIS — M2550 Pain in unspecified joint: Secondary | ICD-10-CM | POA: Diagnosis not present

## 2017-01-27 DIAGNOSIS — Z6834 Body mass index (BMI) 34.0-34.9, adult: Secondary | ICD-10-CM | POA: Diagnosis not present

## 2017-01-27 DIAGNOSIS — E669 Obesity, unspecified: Secondary | ICD-10-CM | POA: Diagnosis not present

## 2017-03-18 DIAGNOSIS — D485 Neoplasm of uncertain behavior of skin: Secondary | ICD-10-CM | POA: Diagnosis not present

## 2017-03-18 DIAGNOSIS — Z85828 Personal history of other malignant neoplasm of skin: Secondary | ICD-10-CM | POA: Diagnosis not present

## 2017-03-18 DIAGNOSIS — L570 Actinic keratosis: Secondary | ICD-10-CM | POA: Diagnosis not present

## 2017-03-18 DIAGNOSIS — D0439 Carcinoma in situ of skin of other parts of face: Secondary | ICD-10-CM | POA: Diagnosis not present

## 2017-03-18 DIAGNOSIS — L821 Other seborrheic keratosis: Secondary | ICD-10-CM | POA: Diagnosis not present

## 2017-03-23 DIAGNOSIS — I252 Old myocardial infarction: Secondary | ICD-10-CM | POA: Diagnosis not present

## 2017-03-23 DIAGNOSIS — I10 Essential (primary) hypertension: Secondary | ICD-10-CM | POA: Diagnosis not present

## 2017-03-23 DIAGNOSIS — J449 Chronic obstructive pulmonary disease, unspecified: Secondary | ICD-10-CM | POA: Diagnosis not present

## 2017-03-23 DIAGNOSIS — E119 Type 2 diabetes mellitus without complications: Secondary | ICD-10-CM | POA: Diagnosis not present

## 2017-03-23 DIAGNOSIS — G4733 Obstructive sleep apnea (adult) (pediatric): Secondary | ICD-10-CM | POA: Diagnosis not present

## 2017-03-23 DIAGNOSIS — E7800 Pure hypercholesterolemia, unspecified: Secondary | ICD-10-CM | POA: Diagnosis not present

## 2017-06-22 DIAGNOSIS — Z85828 Personal history of other malignant neoplasm of skin: Secondary | ICD-10-CM | POA: Diagnosis not present

## 2017-06-22 DIAGNOSIS — L570 Actinic keratosis: Secondary | ICD-10-CM | POA: Diagnosis not present

## 2017-07-14 DIAGNOSIS — H04123 Dry eye syndrome of bilateral lacrimal glands: Secondary | ICD-10-CM | POA: Diagnosis not present

## 2017-07-14 DIAGNOSIS — H1132 Conjunctival hemorrhage, left eye: Secondary | ICD-10-CM | POA: Diagnosis not present

## 2017-07-20 DIAGNOSIS — Z6833 Body mass index (BMI) 33.0-33.9, adult: Secondary | ICD-10-CM | POA: Diagnosis not present

## 2017-07-20 DIAGNOSIS — M2550 Pain in unspecified joint: Secondary | ICD-10-CM | POA: Diagnosis not present

## 2017-07-20 DIAGNOSIS — E669 Obesity, unspecified: Secondary | ICD-10-CM | POA: Diagnosis not present

## 2017-07-20 DIAGNOSIS — M150 Primary generalized (osteo)arthritis: Secondary | ICD-10-CM | POA: Diagnosis not present

## 2017-09-01 ENCOUNTER — Ambulatory Visit (HOSPITAL_BASED_OUTPATIENT_CLINIC_OR_DEPARTMENT_OTHER)
Admission: RE | Admit: 2017-09-01 | Discharge: 2017-09-01 | Disposition: A | Discharge status: Home / Self Care | Payer: Medicare Other | Source: Ambulatory Visit | Attending: Acute Care | Admitting: Acute Care

## 2017-09-01 DIAGNOSIS — J439 Emphysema, unspecified: Secondary | ICD-10-CM | POA: Insufficient documentation

## 2017-09-01 DIAGNOSIS — Z87891 Personal history of nicotine dependence: Secondary | ICD-10-CM

## 2017-09-01 DIAGNOSIS — I289 Disease of pulmonary vessels, unspecified: Secondary | ICD-10-CM | POA: Diagnosis not present

## 2017-09-01 DIAGNOSIS — I2510 Atherosclerotic heart disease of native coronary artery without angina pectoris: Secondary | ICD-10-CM | POA: Insufficient documentation

## 2017-09-01 DIAGNOSIS — Z122 Encounter for screening for malignant neoplasm of respiratory organs: Secondary | ICD-10-CM | POA: Diagnosis not present

## 2017-09-01 DIAGNOSIS — I700 Atherosclerosis of aorta: Secondary | ICD-10-CM | POA: Insufficient documentation

## 2017-09-01 IMAGING — CT CT CHEST LUNG CANCER SCREENING LOW DOSE W/O CM
2 of 4 series · 15 of 40 positions shown, 18 images · non-contrast
Comparison: 08/31/2016

CLINICAL DATA: Asymptomatic, quitting smoking 3 years ago. 31
pack-year smoking history. Diabetes. Hypertension. Coronary artery
disease.

EXAM:
CT CHEST WITHOUT CONTRAST LOW-DOSE FOR LUNG CANCER SCREENING
TECHNIQUE: Multidetector CT imaging of the chest was performed following the
standard protocol without IV contrast.

[Series 2: axial st · axial · 0.83mm/px · z∈[-309,-49]mm · 12 of 60 slices shown, 15 images]
[im 5/60  mediastinal]
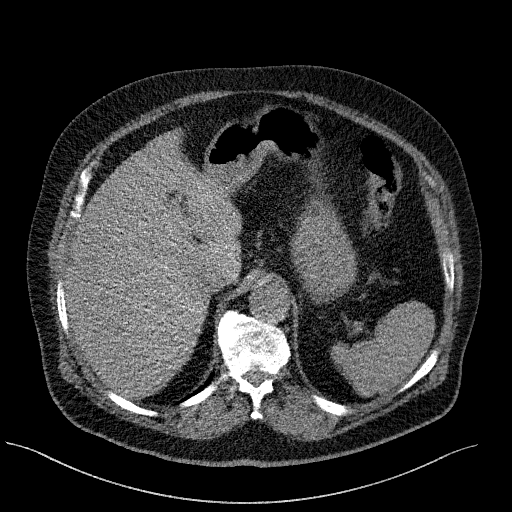
[im 5/60  lung]
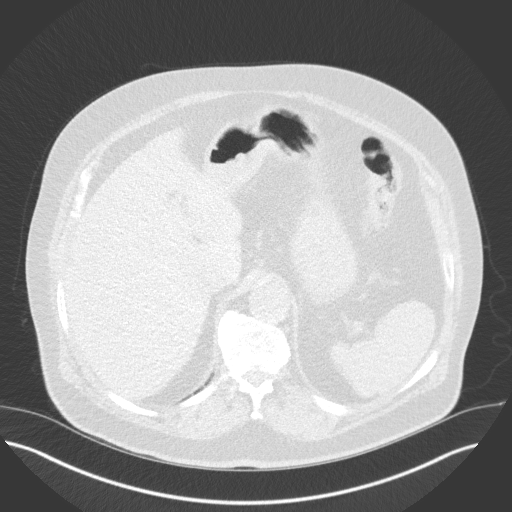
[im 10/60  lung]
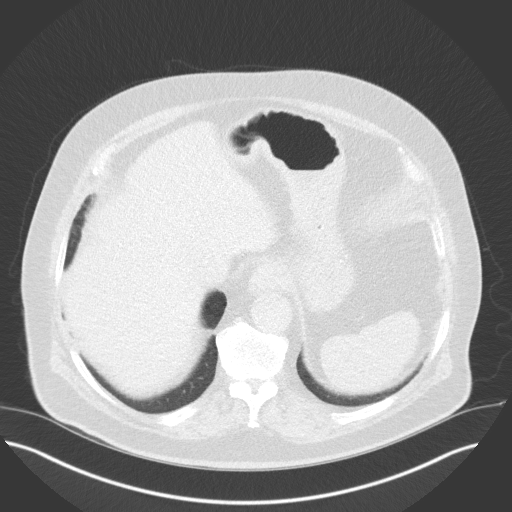
[im 15/60  lung]
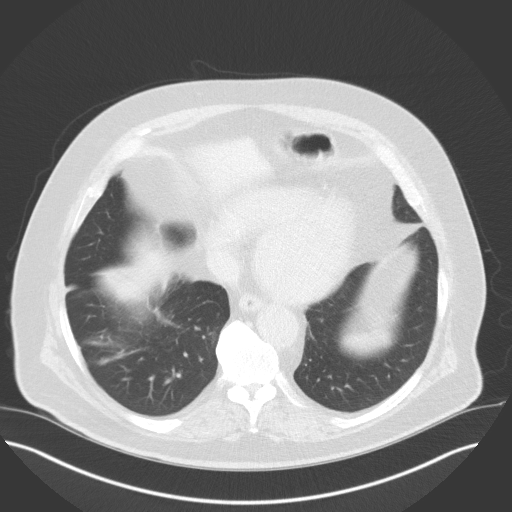
[im 19/60  lung]
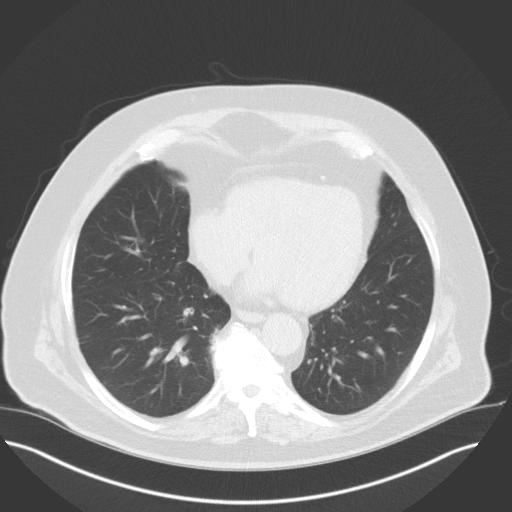
[im 24/60  mediastinal]
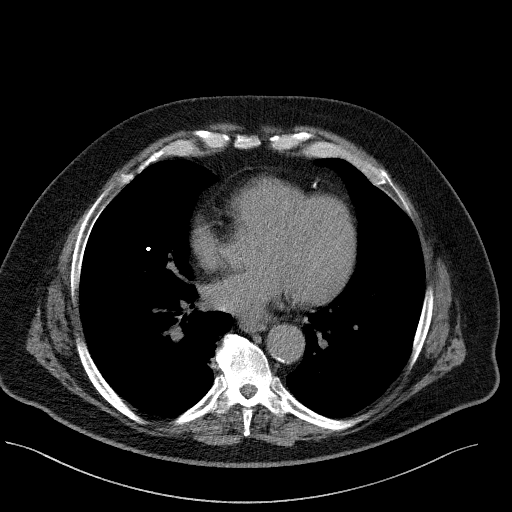
[im 24/60  lung]
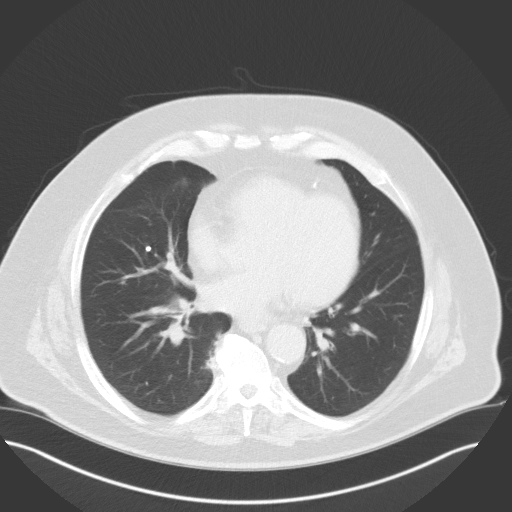
[im 29/60  lung]
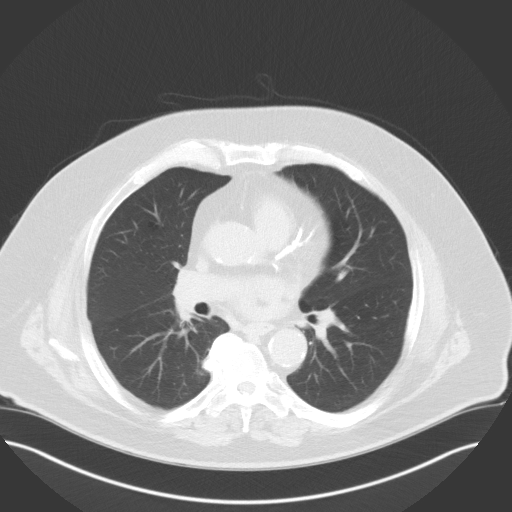
[im 34/60  lung]
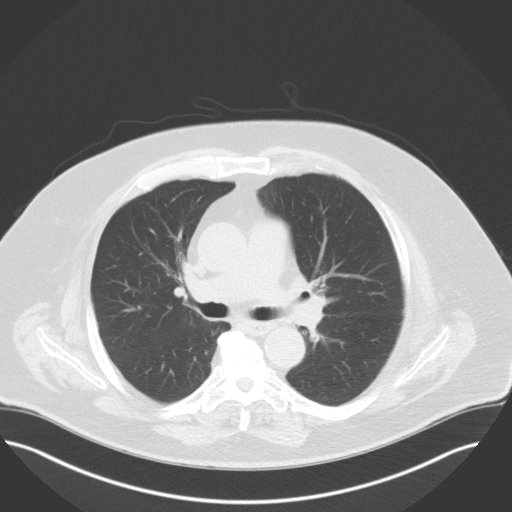
[im 38/60  lung]
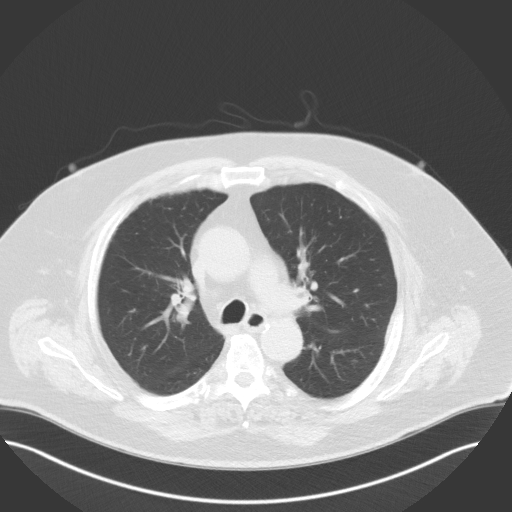
[im 43/60  mediastinal]
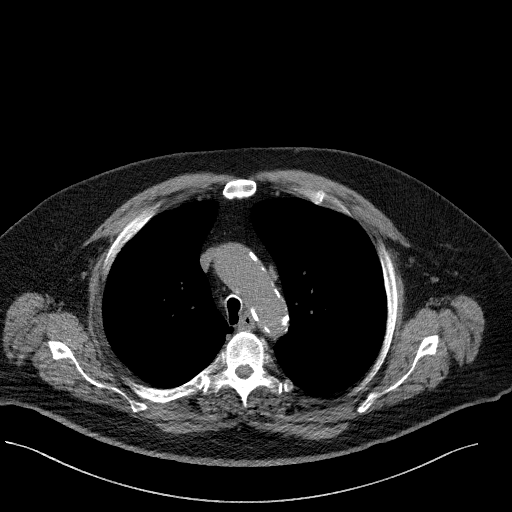
[im 43/60  lung]
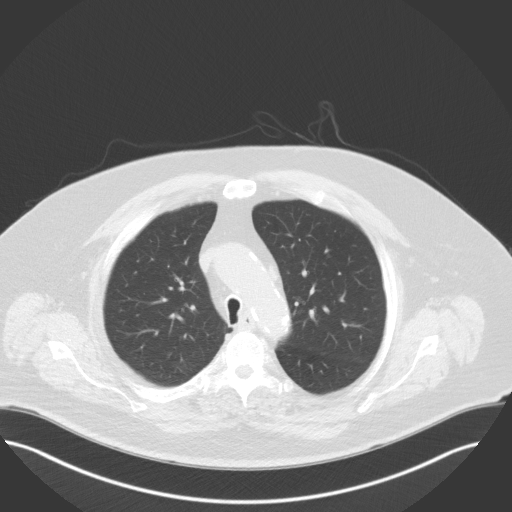
[im 48/60  lung]
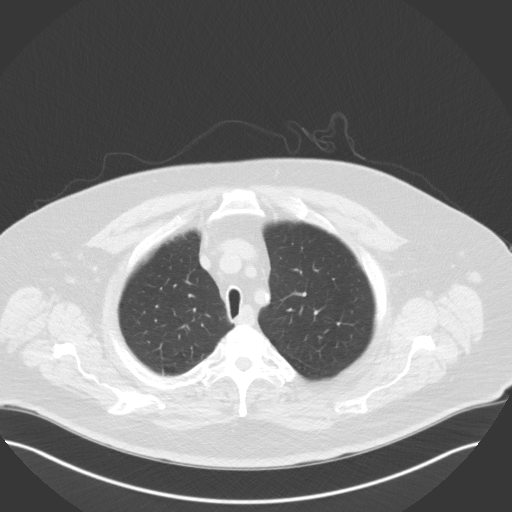
[im 52/60  lung]
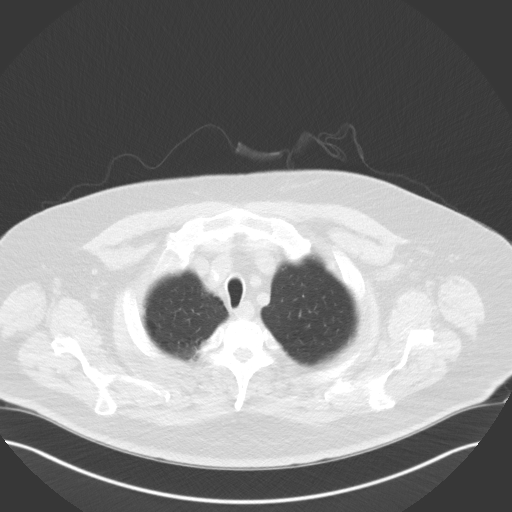
[im 57/60  lung]
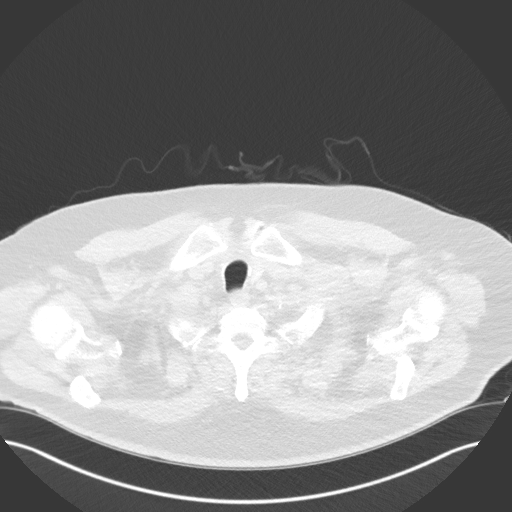

[Series 4: coronal · coronal · 0.62mm/px · 3 of 312 slices shown]
[im 63/312  lung]
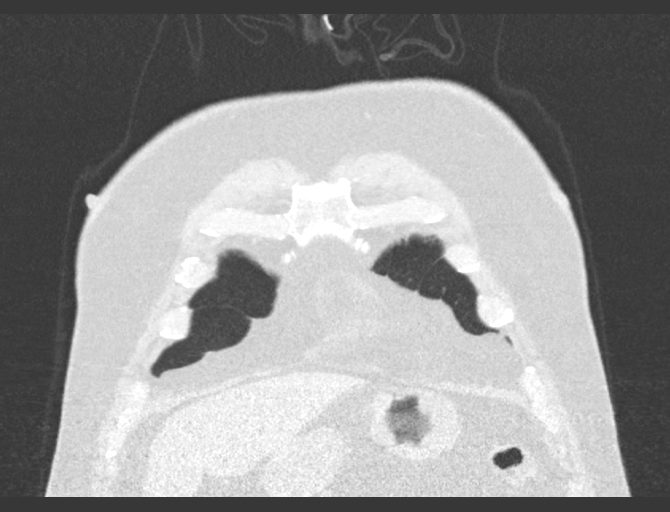
[im 125/312  lung]
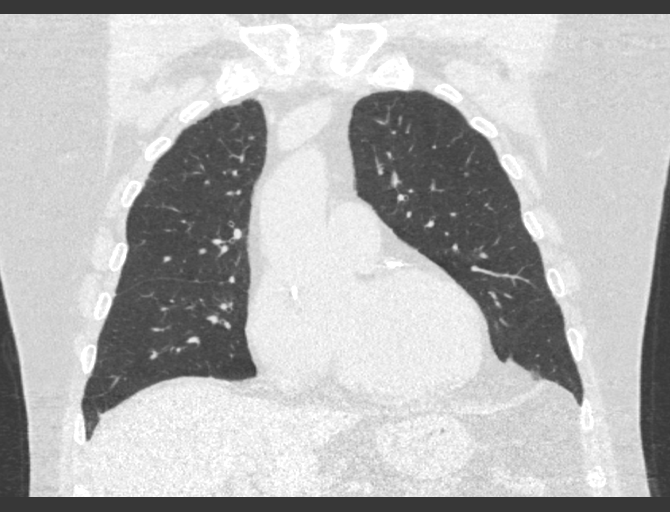
[im 187/312  lung]
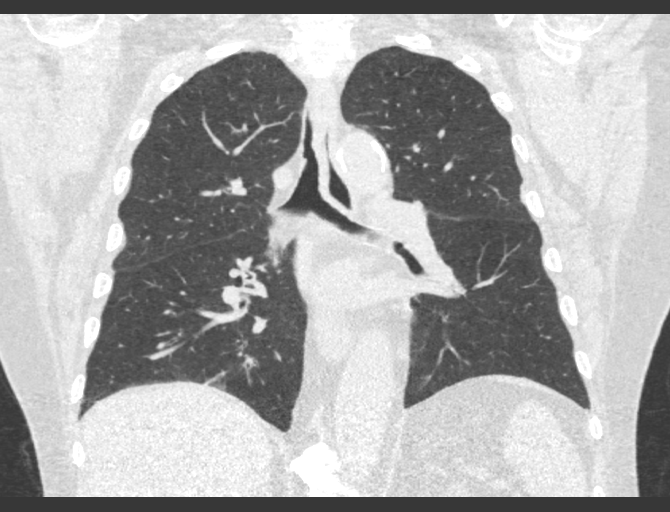

[15 of 40 positions shown; findings below may reference images not displayed]

FINDINGS: Cardiovascular: Aortic and branch vessel atherosclerosis. Tortuous
thoracic aorta. Borderline cardiomegaly, without pericardial
effusion. Multivessel coronary artery atherosclerosis. Mild
pulmonary artery enlargement, outflow tract 3.1 cm.

Mediastinum/Nodes: No mediastinal or definite hilar adenopathy,
given limitations of unenhanced CT.

Lungs/Pleura: No pleural fluid. Mild centrilobular emphysema.
Scattered calcified granulomas.

Bibasilar scarring.

Perifissural right upper lobe pulmonary nodule is similar, including
at volume derived equivalent diameter 3.0 mm.

Upper Abdomen: Normal imaged portions of the liver, spleen, stomach,
pancreas, adrenal glands.

Musculoskeletal: Moderate thoracic spondylosis.
IMPRESSION: 1. Lung-RADS 2, benign appearance or behavior. Continue annual
screening with low-dose chest CT without contrast in 12 months.
2.  Emphysema (I1UAS-ZDK.8).
3. Coronary artery atherosclerosis. Aortic Atherosclerosis
([id number]-EGJ.J).
4. Pulmonary artery enlargement suggests pulmonary arterial
hypertension.

## 2017-09-07 DIAGNOSIS — I2510 Atherosclerotic heart disease of native coronary artery without angina pectoris: Secondary | ICD-10-CM | POA: Diagnosis not present

## 2017-09-07 DIAGNOSIS — E119 Type 2 diabetes mellitus without complications: Secondary | ICD-10-CM | POA: Diagnosis not present

## 2017-09-07 DIAGNOSIS — Z6833 Body mass index (BMI) 33.0-33.9, adult: Secondary | ICD-10-CM | POA: Diagnosis not present

## 2017-09-07 DIAGNOSIS — E669 Obesity, unspecified: Secondary | ICD-10-CM | POA: Diagnosis not present

## 2017-09-07 DIAGNOSIS — Z5181 Encounter for therapeutic drug level monitoring: Secondary | ICD-10-CM | POA: Diagnosis not present

## 2017-09-07 DIAGNOSIS — Z23 Encounter for immunization: Secondary | ICD-10-CM | POA: Diagnosis not present

## 2017-09-15 DIAGNOSIS — E119 Type 2 diabetes mellitus without complications: Secondary | ICD-10-CM | POA: Diagnosis not present

## 2017-09-28 ENCOUNTER — Ambulatory Visit: Payer: Medicare Other | Admitting: Pulmonary Disease

## 2017-10-04 DIAGNOSIS — M150 Primary generalized (osteo)arthritis: Secondary | ICD-10-CM | POA: Diagnosis not present

## 2017-10-04 DIAGNOSIS — I2510 Atherosclerotic heart disease of native coronary artery without angina pectoris: Secondary | ICD-10-CM | POA: Diagnosis not present

## 2017-10-04 DIAGNOSIS — E7800 Pure hypercholesterolemia, unspecified: Secondary | ICD-10-CM | POA: Diagnosis not present

## 2017-10-04 DIAGNOSIS — I252 Old myocardial infarction: Secondary | ICD-10-CM | POA: Diagnosis not present

## 2017-10-04 DIAGNOSIS — G4733 Obstructive sleep apnea (adult) (pediatric): Secondary | ICD-10-CM | POA: Diagnosis not present

## 2017-10-04 DIAGNOSIS — I10 Essential (primary) hypertension: Secondary | ICD-10-CM | POA: Diagnosis not present

## 2017-10-04 DIAGNOSIS — E669 Obesity, unspecified: Secondary | ICD-10-CM | POA: Diagnosis not present

## 2017-10-04 DIAGNOSIS — E119 Type 2 diabetes mellitus without complications: Secondary | ICD-10-CM | POA: Diagnosis not present

## 2017-10-04 DIAGNOSIS — J449 Chronic obstructive pulmonary disease, unspecified: Secondary | ICD-10-CM | POA: Diagnosis not present

## 2017-10-04 DIAGNOSIS — R809 Proteinuria, unspecified: Secondary | ICD-10-CM | POA: Diagnosis not present

## 2017-10-04 DIAGNOSIS — Z6833 Body mass index (BMI) 33.0-33.9, adult: Secondary | ICD-10-CM | POA: Diagnosis not present

## 2017-10-10 ENCOUNTER — Ambulatory Visit (INDEPENDENT_AMBULATORY_CARE_PROVIDER_SITE_OTHER): Payer: Medicare Other | Admitting: Pulmonary Disease

## 2017-10-10 ENCOUNTER — Ambulatory Visit (INDEPENDENT_AMBULATORY_CARE_PROVIDER_SITE_OTHER)
Admission: RE | Admit: 2017-10-10 | Discharge: 2017-10-10 | Disposition: A | Discharge status: Home / Self Care | Payer: Medicare Other | Source: Ambulatory Visit | Attending: Pulmonary Disease | Admitting: Pulmonary Disease

## 2017-10-10 ENCOUNTER — Encounter: Payer: Self-pay | Admitting: Pulmonary Disease

## 2017-10-10 VITALS — BP 132/74 | HR 63 | Ht 69.5 in | Wt 228.0 lb

## 2017-10-10 DIAGNOSIS — J432 Centrilobular emphysema: Secondary | ICD-10-CM | POA: Diagnosis not present

## 2017-10-10 DIAGNOSIS — J301 Allergic rhinitis due to pollen: Secondary | ICD-10-CM | POA: Diagnosis not present

## 2017-10-10 DIAGNOSIS — R05 Cough: Secondary | ICD-10-CM

## 2017-10-10 DIAGNOSIS — R059 Cough, unspecified: Secondary | ICD-10-CM

## 2017-10-10 IMAGING — DX DG CHEST 2V
2 series · 2 of 2 positions shown · non-contrast
Comparison: Radiographs June 15, 2013.

CLINICAL DATA: Cough.

EXAM:
CHEST  2 VIEW

[chest pa]
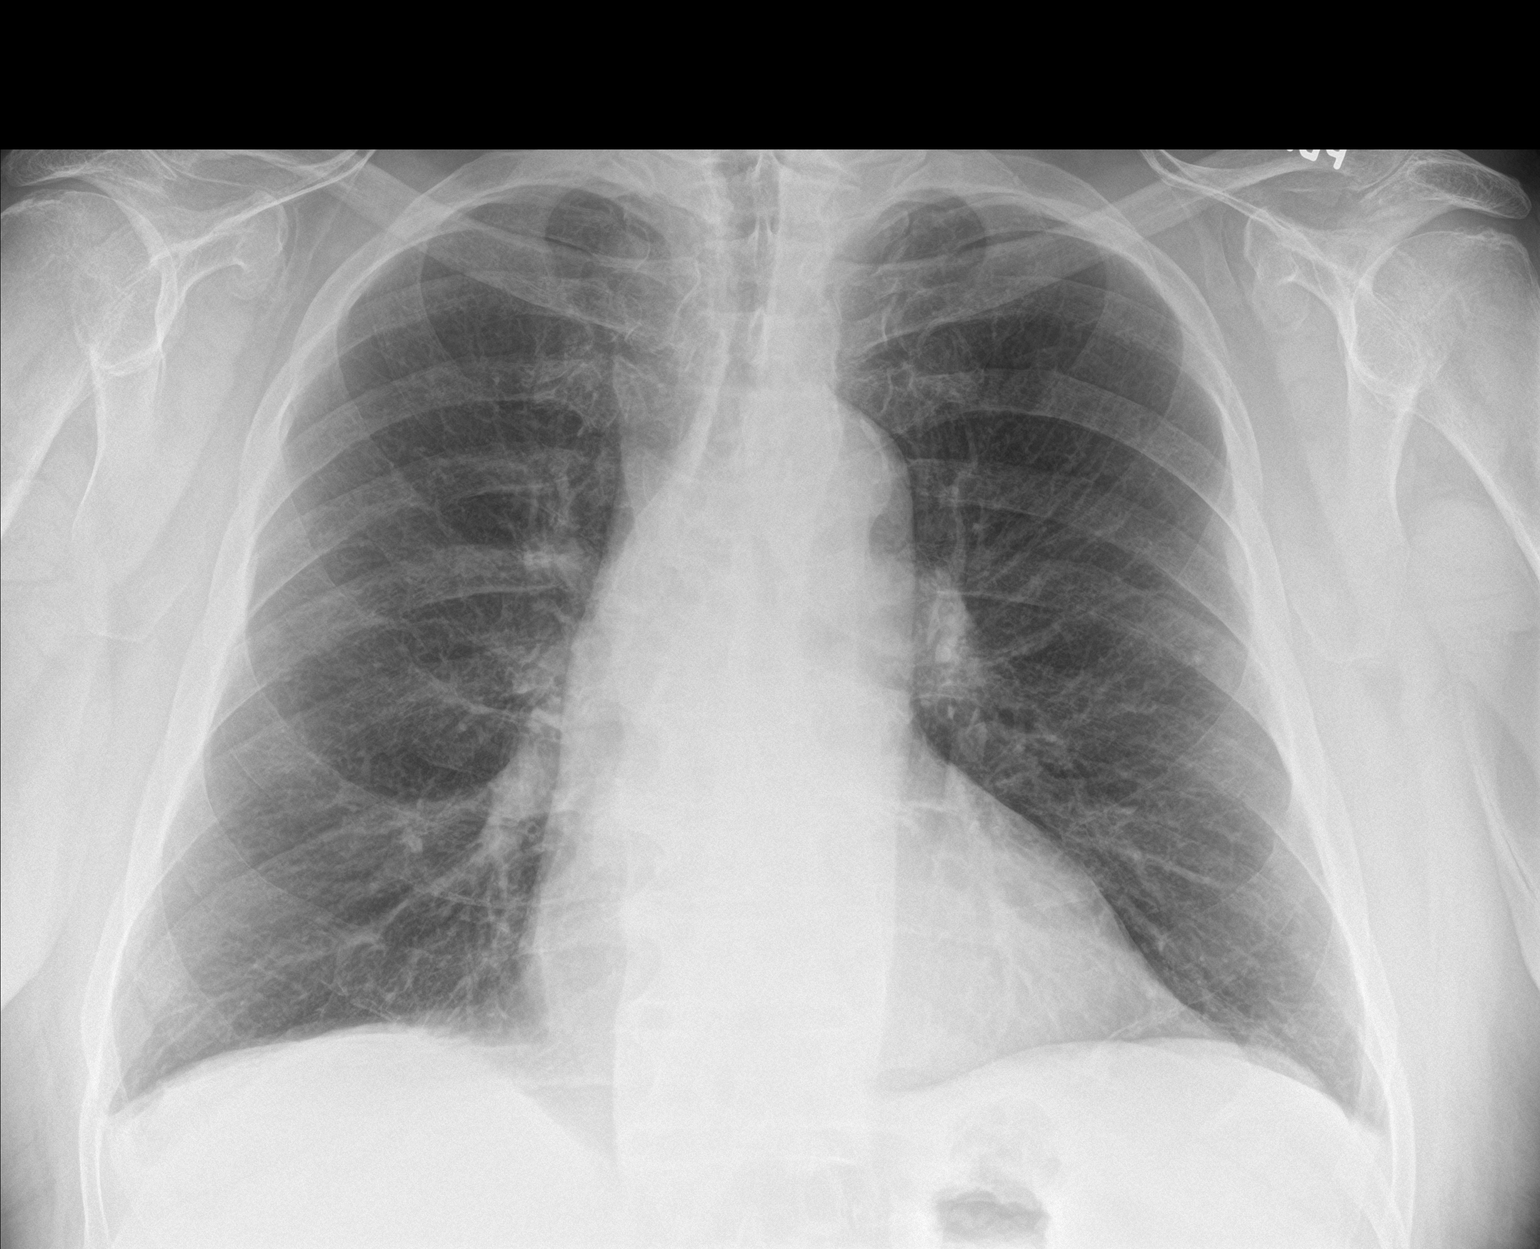

[chest lat]
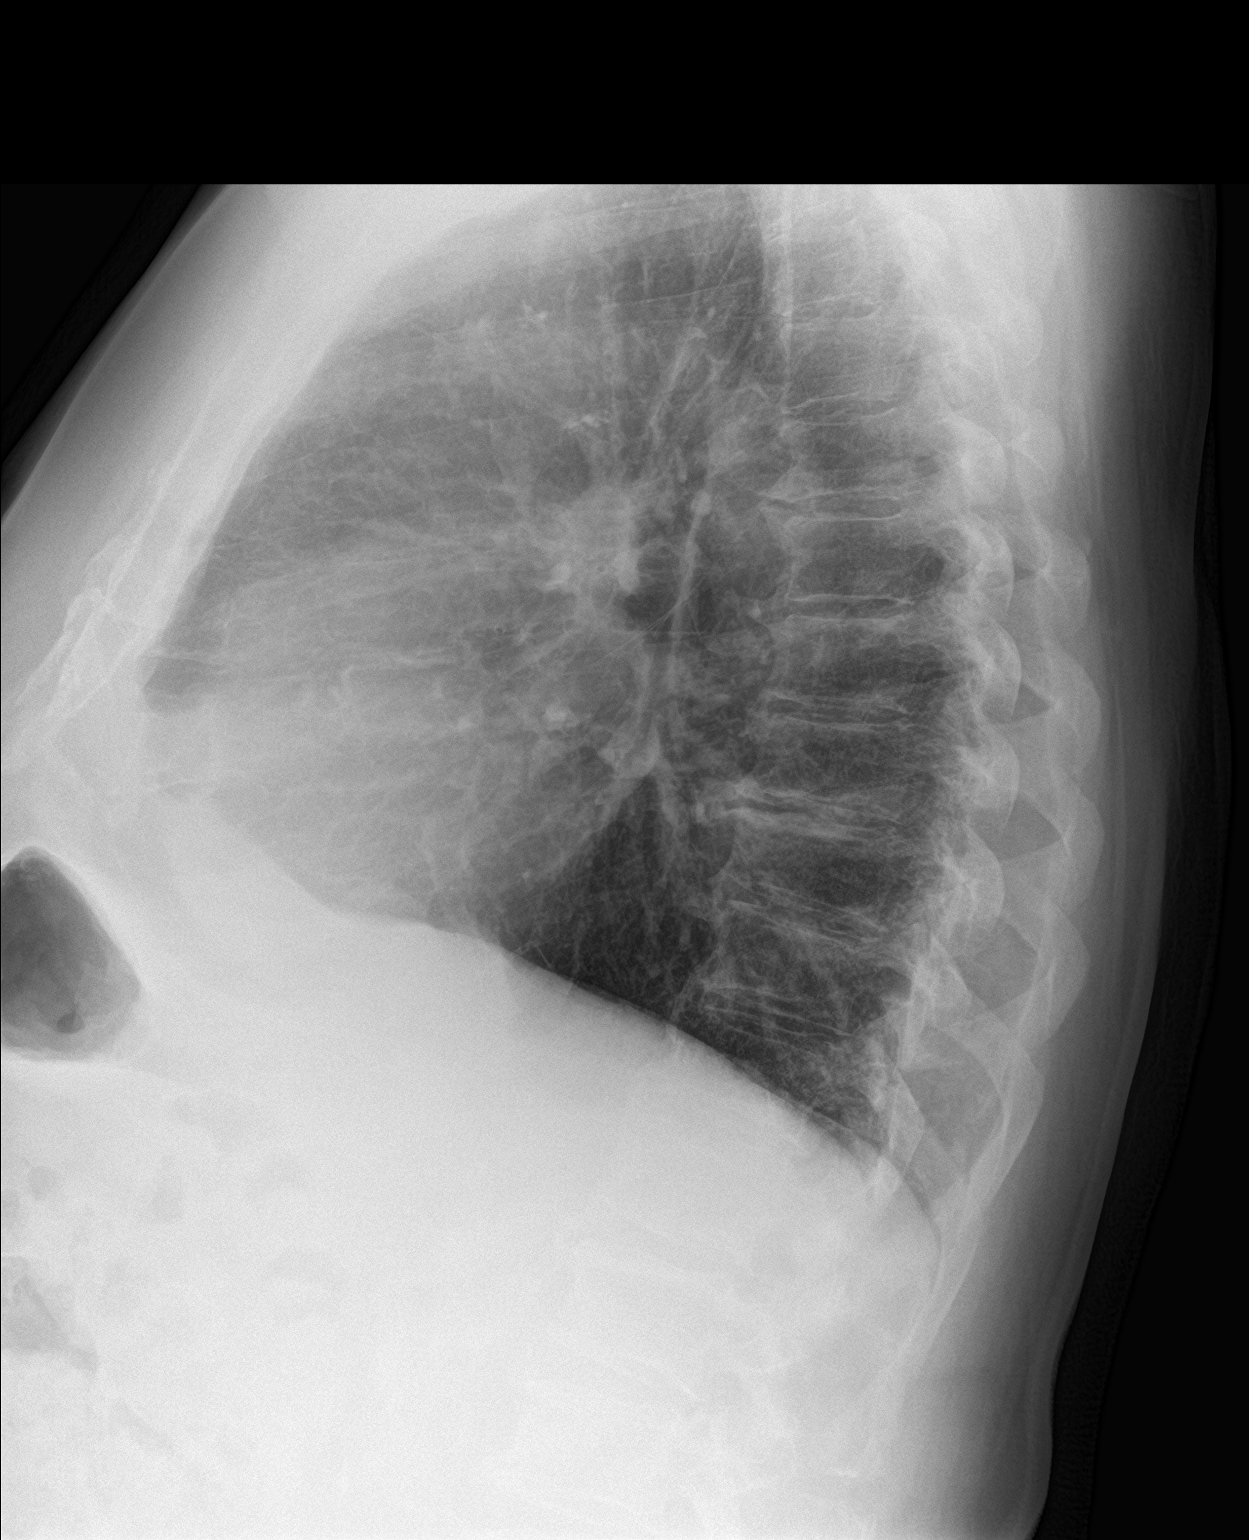

[2 of 2 positions shown; findings below may reference images not displayed]

FINDINGS: The heart size and mediastinal contours are within normal limits.
Both lungs are clear. No pneumothorax or pleural effusion is noted.
Atherosclerosis of thoracic aorta is noted. The visualized skeletal
structures are unremarkable.
IMPRESSION: No active cardiopulmonary disease.  Aortic atherosclerosis.

## 2017-10-10 MED ORDER — INDACATEROL-GLYCOPYRROLATE 27.5-15.6 MCG IN CAPS: 1.0000 | ORAL_CAPSULE | Freq: Every day | RESPIRATORY_TRACT | 5 refills | Status: DC

## 2017-10-10 MED ORDER — INDACATEROL-GLYCOPYRROLATE 27.5-15.6 MCG IN CAPS: 1.0000 | ORAL_CAPSULE | Freq: Every day | RESPIRATORY_TRACT | 0 refills | Status: DC

## 2017-10-10 NOTE — Progress Notes (Signed)
Subjective:    Patient ID: Steven Newman, male    DOB: 06-13-1941, 76 y.o.   MRN: [id number]  Synopsis: Came back to [facility] in 2016 after previously being followed by Dr. Joya Gaskins and Dr. Elsworth Soho for COPD and obstructive sleep apnea. Has a lengthy smoking history where he smoked as much as 2 packs of cigarettes daily and quit in 2014.  He started back smoking briefly but he quit again in July 2016.   HPI  Chief Complaint  Patient presents with  . Follow-up    pt states he is doing well at this time.     Paschal says that he traveled to [place] back in May, he hasn't been back since.   He says that his lungs have been OK lately.   He hasn't taken any inhalers lately.  He hasn't been using any controller inhalers even though Utibron is on his list.  He he hasn't used albuterol at all in the last month.   He has some dyspnea on exertion, heat and humidity will make it worse.  His wife thinks that it is actually worse than this.  Occasional wheeze.    His wife notes that he coughs a lot.  He thinks it is coming from his nose.  He finds that it's worse in the Spring.    He's had a flu shot already.    Past Medical History:  Diagnosis Date  . CAD (coronary artery disease)    coronary angioplasty 1995  . Dyspnea   . Erectile dysfunction   . History of bladder cancer   . Hx of colonic polyps   . Hyperlipidemia   . Hypertension   . Kidney stones   . MI (myocardial infarction) ([place])    1976, 1985  . Obesity   . Pneumonia   . Skin cancer   . Type 2 diabetes mellitus ([place])   . Wheezing      Review of Systems  Constitutional: Positive for fatigue. Negative for chills and fever.  HENT: Negative for postnasal drip, rhinorrhea and sinus pressure.   Respiratory: Negative for cough, shortness of breath and wheezing.   Cardiovascular: Negative for chest pain, palpitations and leg swelling.       Objective:   Physical Exam Vitals:   10/10/17 0938  BP: 132/74  Pulse: 63  SpO2:  96%  Weight: 228 lb (103.4 kg)  Height: 5' 9.5" (1.765 m)   room air   Gen: well appearing HENT: OP clear, TM's clear, neck supple PULM: CTA B, normal percussion CV: RRR, no mgr, trace edema GI: BS+, soft, nontender Derm: no cyanosis or rash Psyche: normal mood and affect   PFT: Simple spirometry August 2016 showed clear airflow obstruction with an FEV1 of 1.3 L (42% predicted), FVC 1.7 L, 47% predicted       Assessment & Plan:   Cough - Plan: DG Chest 2 View  Centrilobular emphysema (HCC)  Seasonal allergic rhinitis due to pollen  Discussion: Artin has had a stable interval with the exception of a worsening cough recently.  He thinks this may be related to postnasal drip which is poorly controlled.  He is not taking anything for that.  He does note some exertional dyspnea and right now he is not taking any controller medicines.  I explained to him that the value of controller medicines are that they improve shortness of breath and help reduce the incidence of exacerbations of COPD.  Plan: For COPD: Taking Utibron twice  a day could help your shortness of breath Stay active  Practice good hand hygiene  Cough:  We will check a chest x-ray today Follow-up with the allergic rhinitis recommendations  Allergic rhinitis: Use Neil Med rinses with distilled water at least twice per day using the instructions on the package. 1/2 hour after using the [facility] Med rinse, use Nasacort two puffs in each nostril once per day.  Remember that the Nasacort can take 1-2 weeks to work after regular use. Use generic zyrtec (cetirizine) every day.  If this doesn't help, then stop taking it and use chlorpheniramine-phenylephrine combination tablets.   We will see you back in 6-8 months or sooner if needed    Current Outpatient Medications:  .  albuterol (PROVENTIL HFA;VENTOLIN HFA) 108 (90 Base) MCG/ACT inhaler, Inhale 2 puffs into the lungs every 6 (six) hours as needed for wheezing or  shortness of breath., Disp: , Rfl:  .  albuterol (PROVENTIL) (2.5 MG/3ML) 0.083% nebulizer solution, Take 3 mLs (2.5 mg total) by nebulization every 4 (four) hours as needed for shortness of breath., Disp: 75 mL, Rfl: 3 .  aspirin 81 MG tablet, Take 81 mg by mouth daily., Disp: , Rfl:  .  atorvastatin (LIPITOR) 40 MG tablet, Take 40 mg by mouth daily., Disp: , Rfl: 0 .  Cholecalciferol (VITAMIN D) 2000 UNITS tablet, Take 2,000 Units by mouth daily., Disp: , Rfl:  .  Coenzyme Q10 (CO Q-10) 100 MG CAPS, Take 1 capsule by mouth daily., Disp: , Rfl:  .  Indacaterol-Glycopyrrolate (UTIBRON NEOHALER) 27.5-15.6 MCG CAPS, Place 1 puff daily into inhaler and inhale., Disp: 2 capsule, Rfl: 0 .  nadolol (CORGARD) 40 MG tablet, Take 40 mg by mouth daily., Disp: , Rfl:  .  nitroGLYCERIN (NITROSTAT) 0.4 MG SL tablet, Place 0.4 mg under the tongue every 5 (five) minutes as needed for chest pain., Disp: , Rfl:  .  Omega-3 Fatty Acids (RA FISH OIL) 1400 MG CPDR, Take 1 capsule by mouth daily. , Disp: , Rfl:  .  PRESCRIPTION MEDICATION, Take 1 tablet by mouth daily. Triflex for arthritis as needed , Disp: , Rfl:  .  TURMERIC PO, Take 1,000 mg daily by mouth., Disp: , Rfl:  .  UNABLE TO FIND, Take 5 drops 2 (two) times daily by mouth. Med Name: [employer] CBD [employer], Disp: , Rfl:  .  Indacaterol-Glycopyrrolate (UTIBRON NEOHALER) 27.5-15.6 MCG CAPS, Place 1 puff daily into inhaler and inhale., Disp: 60 capsule, Rfl: 5

## 2017-10-10 NOTE — Patient Instructions (Signed)
For COPD: Taking Utibron twice a day could help your shortness of breath Stay active  Practice good hand hygiene  Cough:  We will check a chest x-ray today Follow-up with the allergic rhinitis recommendations  Allergic rhinitis: Use Neil Med rinses with distilled water at least twice per day using the instructions on the package. 1/2 hour after using the [facility] Med rinse, use Nasacort two puffs in each nostril once per day.  Remember that the Nasacort can take 1-2 weeks to work after regular use. Use generic zyrtec (cetirizine) every day.  If this doesn't help, then stop taking it and use chlorpheniramine-phenylephrine combination tablets.   We will see you back in 6-8 months or sooner if needed

## 2017-10-13 ENCOUNTER — Other Ambulatory Visit: Payer: Self-pay | Admitting: Interventional Cardiology

## 2017-10-14 ENCOUNTER — Telehealth: Payer: Self-pay

## 2017-10-14 NOTE — Telephone Encounter (Signed)
Called [facility] to initiate PA for Utibron. Rosanna Randy with [facility] states they are currently unable to do PA's, due to computer system being down. I was advised to call back in an hour. Will call back  Will route to [place] and myself to f/u on.

## 2017-10-14 NOTE — Telephone Encounter (Signed)
I called [facility] a second time and attempted to start PA, and were told that systems are still down. Will call back back

## 2017-10-20 NOTE — Telephone Encounter (Signed)
Called [facility] and spoke with [place], who initiated PA and it has been relayed to pharmacy team for clinical review.  A response is expected within 72 hours.    PA#: [id number]  Will continue to hold for determination.

## 2017-10-21 ENCOUNTER — Telehealth: Payer: Self-pay | Admitting: Pulmonary Disease

## 2017-10-21 NOTE — Telephone Encounter (Signed)
Spoke with patient's wife. She stated that she received a call from the pharmacy stating an one month supply would cost $200. They can not afford this. She contacted the patient's PCP office to see if they could help, they gave him more samples and advised him to just use the samples to determine if the medication will actually work.

## 2017-10-25 DIAGNOSIS — I252 Old myocardial infarction: Secondary | ICD-10-CM | POA: Diagnosis not present

## 2017-10-25 DIAGNOSIS — J449 Chronic obstructive pulmonary disease, unspecified: Secondary | ICD-10-CM | POA: Diagnosis not present

## 2017-10-25 DIAGNOSIS — E119 Type 2 diabetes mellitus without complications: Secondary | ICD-10-CM | POA: Diagnosis not present

## 2017-10-25 DIAGNOSIS — I2510 Atherosclerotic heart disease of native coronary artery without angina pectoris: Secondary | ICD-10-CM | POA: Diagnosis not present

## 2017-10-25 DIAGNOSIS — M150 Primary generalized (osteo)arthritis: Secondary | ICD-10-CM | POA: Diagnosis not present

## 2017-10-25 DIAGNOSIS — I10 Essential (primary) hypertension: Secondary | ICD-10-CM | POA: Diagnosis not present

## 2017-10-26 NOTE — Telephone Encounter (Signed)
Utibron PA has been approved under Medicare part D through 11/23/2018.  Pt already aware.  Nothing further needed.

## 2017-11-16 ENCOUNTER — Other Ambulatory Visit: Payer: Self-pay | Admitting: Interventional Cardiology

## 2017-11-17 ENCOUNTER — Telehealth: Payer: Self-pay | Admitting: Interventional Cardiology

## 2017-11-17 MED ORDER — ATORVASTATIN CALCIUM 40 MG PO TABS: ORAL_TABLET | ORAL | 0 refills | Status: DC

## 2017-11-17 NOTE — Telephone Encounter (Signed)
°  New Prob   *STAT* If patient is at the pharmacy, call can be transferred to refill team.   1. Which medications need to be refilled? (please list name of each medication and dose if known)  Atorvastatin 40 mg once a day  2. Which pharmacy/location (including street and city if local pharmacy) is medication to be sent to? [facility] in [address] in [place], [place]  3. Do they need a 30 day or 90 day supply? [place] 1/7 FOR FOLLOW UP.

## 2017-11-17 NOTE — Telephone Encounter (Signed)
Pt's medication was sent to pt's pharmacy as requested. Confirmation received.  °

## 2017-12-12 ENCOUNTER — Encounter: Payer: Self-pay | Admitting: Cardiology

## 2017-12-12 ENCOUNTER — Ambulatory Visit (INDEPENDENT_AMBULATORY_CARE_PROVIDER_SITE_OTHER): Payer: Medicare Other | Admitting: Cardiology

## 2017-12-12 DIAGNOSIS — I2510 Atherosclerotic heart disease of native coronary artery without angina pectoris: Secondary | ICD-10-CM | POA: Diagnosis not present

## 2017-12-12 DIAGNOSIS — Z85828 Personal history of other malignant neoplasm of skin: Secondary | ICD-10-CM | POA: Diagnosis not present

## 2017-12-12 DIAGNOSIS — B029 Zoster without complications: Secondary | ICD-10-CM | POA: Diagnosis not present

## 2017-12-12 MED ORDER — NADOLOL 40 MG PO TABS: 40.0000 mg | ORAL_TABLET | Freq: Every day | ORAL | 3 refills | Status: DC

## 2017-12-12 MED ORDER — ATORVASTATIN CALCIUM 40 MG PO TABS: ORAL_TABLET | ORAL | 3 refills | Status: DC

## 2017-12-12 NOTE — Patient Instructions (Signed)
Medication Instructions:  Your physician recommends that you continue on your current medications as directed. Please refer to the Current Medication list given to you today.  Refills have been sent in for Nadolol and Atorvastatin  Labwork: None ordered  Testing/Procedures: Your physician has requested that you have an aorta-illiac duplex. During this test, an ultrasound is used to evaluate the aorta and iliac arteries. Do not eat after midnight the day before and avoid carbonated beverages  Follow-Up: Your physician recommends that you schedule a follow-up appointment with Dr. Fletcher Anon AFTER your ultrasound.  Your physician wants you to follow-up in 6 months with Dr. Irish Lack. You will receive a reminder letter in the mail two months in advance. If you don't receive a letter, please call our office to schedule the follow-up appointment.    Any Other Special Instructions Will Be Listed Below (If Applicable).     If you need a refill on your cardiac medications before your next appointment, please call your pharmacy.

## 2017-12-12 NOTE — Progress Notes (Signed)
12/12/2017 [place]   11-05-1941  [id number]  Primary Physician Leeroy Cha, MD Primary Cardiologist: Dr. Irish Lack  PV: Dr. Fletcher Anon  Reason for Visit/CC: F/u for CAD   HPI:  Steven Newman is a 77 y.o. male who is being seen today for routine yearly f/u.  PMH is notable for CAD s/p several MIs in the past and a PTCA in the mid 90s. He has not required any recent revascularizations. He also has PVD, followed by Dr. Fletcher Anon. He is known to have abdominal aortic ectasia which has been followed by ultrasound. 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 Other chronic medical conditions include HTN and HLD. He is a former smoker. Dr. Irish Lack is his primary cardiologist.   He is back today for his yearly f/u. He is here with his wife. He is doing well. No complaints. He denies CP, dyspnea, claudication, palpitations, syncope/ near syncope, orthopnea, PND, LEE, orthopnea. Reports full med compliance. BP is well controlled. EKG shows mild sinus bradycardia at 59 bpm, asymptomatic, and RBBB, unchanged from previous.   No outpatient medications have been marked as taking for the 12/12/17 encounter (Office Visit) with Consuelo Pandy, PA-C.   Allergies  Allergen Reactions  . Ace Inhibitors     Angioedema   . Penicillins Swelling and Rash    Lips swell   Past Medical History:  Diagnosis Date  . CAD (coronary artery disease)    coronary angioplasty 1995  . Dyspnea   . Erectile dysfunction   . History of bladder cancer   . Hx of colonic polyps   . Hyperlipidemia   . Hypertension   . Kidney stones   . MI (myocardial infarction) ([place])    1976, 1985  . Obesity   . Pneumonia   . Skin cancer   . Type 2 diabetes mellitus ([place])   . Wheezing    Family History  Problem Relation Age of Onset  . Heart disease Mother   . Rheumatic fever Father   . Rheumatic fever Brother   . Diabetes  Sister   . Cancer Sister        cancer on eyelid   Past Surgical History:  Procedure Laterality Date  . bladder cancer surgery    . 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 neck tumor     Social History   Socioeconomic History  . Marital status: Married    Spouse name: Not on file  . Number of children: Not on file  . Years of education: Not on file  . Highest education level: Not on file  Social Needs  . Financial resource strain: Not on file  . Food insecurity - worry: Not on file  . Food insecurity - inability: Not on file  . Transportation needs - medical: Not on file  . Transportation needs - non-medical: Not on file  Occupational History  . Occupation: Retired  Tobacco Use  . Smoking status: Former Smoker    Packs/day: 2.00    Years: 50.00    Pack years: 100.00    Types: Cigarettes    Last attempt to quit: 06/20/2015    Years since quitting: 2.4  . Smokeless tobacco: Never Used  . Tobacco comment: now down to afew cigarettes per day based on anxiety level  Substance and Sexual Activity  . Alcohol use: Yes    Alcohol/week: 0.0 oz    Comment: socially  .  Drug use: No  . Sexual activity: Not on file  Other Topics Concern  . Not on file  Social History Narrative  . Not on file     Review of Systems: General: negative for chills, fever, night sweats or weight changes.  Cardiovascular: negative for chest pain, dyspnea on exertion, edema, orthopnea, palpitations, paroxysmal nocturnal dyspnea or shortness of breath Dermatological: negative for rash Respiratory: negative for cough or wheezing Urologic: negative for hematuria Abdominal: negative for nausea, vomiting, diarrhea, bright red blood per rectum, melena, or hematemesis Neurologic: negative for visual changes, syncope, or dizziness All other systems reviewed and are otherwise negative except as noted above.   Physical Exam:  Blood pressure 138/78, pulse 63, resp. rate 16, height 5' 9.5" (1.765 m), weight 229  lb 6.4 oz (104.1 kg), SpO2 97 %.  General appearance: alert, cooperative and no distress Neck: no carotid bruit and no JVD Lungs: clear to auscultation bilaterally Heart: regular rate and rhythm, S1, S2 normal, no murmur, click, rub or gallop Extremities: extremities normal, atraumatic, no cyanosis or edema Pulses: 2+ and symmetric Skin: Skin color, texture, turgor normal. No rashes or lesions Neurologic: Grossly normal  EKG sinus bradycardia 59 bpm, chronic RBBB -- personally reviewed   ASSESSMENT AND PLAN:   1. CAD: s/p several MIs in the past and a PTCA in the mid 90s. He has not required and recent revascularizations. Stable w/o CP or dyspnea. No exertional symptoms. Continue ASA, statin and BB.   2. Abdominal Aortic Ectasia: has been followed yearly. Last ultrasound was 12/2016. He is due for repeat evaluation. Will order. He will f/u with Dr. Fletcher Anon.    3. 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 April Holding, initially, given given lack of claudication. He continues to deny symptoms. He is due for repeat US and f/u with Dr. April Holding. We will have him schedule appts today. Continue ASA and statin therapy.   4. HTN: controlled on current regimen.   5. HLD: controlled with Lipitor. Followed by PCP. Recent FLP 09/2017 showed controlled LDL at 71 mg/dL.    Follow-Up with Dr. Irish Lack in 6 months. He will need to f/u with Dr. Fletcher Anon for PVD.   Brittainy Ladoris Gene, MHS [facility] 12/12/2017 11:19 AM

## 2017-12-21 ENCOUNTER — Telehealth: Payer: Self-pay | Admitting: Cardiovascular Disease

## 2017-12-21 NOTE — Telephone Encounter (Signed)
Returned the call to the patient's wife. The patient verbalized that it was okay to speak with his wife. The patient was diagnosed with shingles 2 weeks ago. He is scheduled to have an Aorta/Iliac ultrasound on Friday 1/18. According to the vascular techs, he should postpone the test. Scheduling will be notified to reschedule.

## 2017-12-21 NOTE — Telephone Encounter (Signed)
New Message  Pt wife called requesting to speak with RN. She states pt has shingles, but it is clearing up. She says pt still has a small spot on his chest and he would like to be sure this will not affect his test on 1/18. Please call back to discuss

## 2017-12-23 ENCOUNTER — Inpatient Hospital Stay (HOSPITAL_COMMUNITY)
Admission: RE | Admit: 2017-12-23 | Payer: Medicare Other | Source: Ambulatory Visit | Attending: Cardiovascular Disease | Admitting: Cardiovascular Disease

## 2017-12-27 ENCOUNTER — Ambulatory Visit: Payer: Medicare Other | Admitting: Cardiovascular Disease

## 2018-01-02 ENCOUNTER — Other Ambulatory Visit: Payer: Self-pay | Admitting: Cardiovascular Disease

## 2018-01-02 DIAGNOSIS — I739 Peripheral vascular disease, unspecified: Secondary | ICD-10-CM

## 2018-01-05 ENCOUNTER — Ambulatory Visit (HOSPITAL_COMMUNITY)
Admission: RE | Admit: 2018-01-05 | Discharge: 2018-01-05 | Disposition: A | Discharge status: Home / Self Care | Payer: Medicare Other | Source: Ambulatory Visit | Attending: Cardiovascular Disease | Admitting: Cardiovascular Disease

## 2018-01-05 DIAGNOSIS — I2510 Atherosclerotic heart disease of native coronary artery without angina pectoris: Secondary | ICD-10-CM | POA: Insufficient documentation

## 2018-01-05 DIAGNOSIS — E669 Obesity, unspecified: Secondary | ICD-10-CM | POA: Insufficient documentation

## 2018-01-05 DIAGNOSIS — I10 Essential (primary) hypertension: Secondary | ICD-10-CM | POA: Diagnosis not present

## 2018-01-05 DIAGNOSIS — E785 Hyperlipidemia, unspecified: Secondary | ICD-10-CM | POA: Insufficient documentation

## 2018-01-05 DIAGNOSIS — Z87891 Personal history of nicotine dependence: Secondary | ICD-10-CM | POA: Diagnosis not present

## 2018-01-05 DIAGNOSIS — E1151 Type 2 diabetes mellitus with diabetic peripheral angiopathy without gangrene: Secondary | ICD-10-CM | POA: Insufficient documentation

## 2018-01-05 DIAGNOSIS — I739 Peripheral vascular disease, unspecified: Secondary | ICD-10-CM

## 2018-01-05 DIAGNOSIS — Z6832 Body mass index (BMI) 32.0-32.9, adult: Secondary | ICD-10-CM | POA: Diagnosis not present

## 2018-01-05 DIAGNOSIS — I77811 Abdominal aortic ectasia: Secondary | ICD-10-CM | POA: Diagnosis not present

## 2018-01-10 ENCOUNTER — Encounter: Payer: Self-pay | Admitting: Cardiovascular Disease

## 2018-01-10 ENCOUNTER — Ambulatory Visit (INDEPENDENT_AMBULATORY_CARE_PROVIDER_SITE_OTHER): Payer: Medicare Other | Admitting: Cardiovascular Disease

## 2018-01-10 VITALS — BP 141/84 | HR 63 | Ht 69.5 in | Wt 222.8 lb

## 2018-01-10 DIAGNOSIS — E782 Mixed hyperlipidemia: Secondary | ICD-10-CM | POA: Diagnosis not present

## 2018-01-10 DIAGNOSIS — I2510 Atherosclerotic heart disease of native coronary artery without angina pectoris: Secondary | ICD-10-CM

## 2018-01-10 DIAGNOSIS — I739 Peripheral vascular disease, unspecified: Secondary | ICD-10-CM

## 2018-01-10 DIAGNOSIS — I714 Abdominal aortic aneurysm, without rupture, unspecified: Secondary | ICD-10-CM

## 2018-01-10 NOTE — Patient Instructions (Signed)

## 2018-01-10 NOTE — Progress Notes (Signed)
Cardiology Office Note   Date:  01/10/2018   ID:  [name], DOB [birth year], MRN [id number]  PCP:  Leeroy Cha, MD  Cardiologist:  Dr. Irish Lack  Chief Complaint  Patient presents with  . Follow-up    PAD/Post duplex      History of Present Illness: Steven Newman is a 77 y.o. male who is here today for a follow-up visit regarding peripheral arterial disease.  He has known history of coronary artery disease status post MI and multiple PCI in the mid 90s. No recent revascularization. He is a previous smoker and quit in 2016. Other chronic medical conditions include hypertension, hyperlipidemia and obesity. 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  He has been treated medically due to lack of claudication. He has been doing reasonably well and denies any chest pain.  He reports chronic exertional dyspnea which limits his activities.  He is actually more limited by this than anything else.  He does not experience claudication.   Past Medical History:  Diagnosis Date  . CAD (coronary artery disease)    coronary angioplasty 1995  . Dyspnea   . Erectile dysfunction   . History of bladder cancer   . Hx of colonic polyps   . Hyperlipidemia   . Hypertension   . Kidney stones   . MI (myocardial infarction) ([place])    1976, 1985  . Obesity   . Pneumonia   . Skin cancer   . Type 2 diabetes mellitus ([place])   . Wheezing     Past Surgical History:  Procedure Laterality Date  . bladder cancer surgery    . 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 neck tumor       Current Outpatient Medications  Medication Sig Dispense Refill  . albuterol (PROVENTIL HFA;VENTOLIN HFA) 108 (90 Base) MCG/ACT inhaler Inhale 2 puffs into the lungs every 6 (six) hours as needed for wheezing or shortness of breath.    Marland Kitchen albuterol (PROVENTIL) (2.5 MG/3ML) 0.083% nebulizer solution Take 3 mLs (2.5 mg total) by nebulization every 4  (four) hours as needed for shortness of breath. 75 mL 3  . aspirin 81 MG tablet Take 81 mg by mouth daily.    Marland Kitchen atorvastatin (LIPITOR) 40 MG tablet TAKE 1 TABLET BY MOUTH DAILY. Please keep upcoming appt in January. Thank you 90 tablet 3  . Cholecalciferol (VITAMIN D) 2000 UNITS tablet Take 2,000 Units by mouth daily.    . Coenzyme Q10 (CO Q-10) 100 MG CAPS Take 1 capsule by mouth daily.    . nadolol (CORGARD) 40 MG tablet Take 1 tablet (40 mg total) by mouth daily. 90 tablet 3  . nitroGLYCERIN (NITROSTAT) 0.4 MG SL tablet Place 0.4 mg under the tongue every 5 (five) minutes as needed for chest pain.    . Omega-3 Fatty Acids (RA FISH OIL) 1400 MG CPDR Take 1 capsule by mouth daily.     Marland Kitchen PRESCRIPTION MEDICATION Take 1 tablet by mouth daily. Triflex for arthritis as needed     . TURMERIC PO Take 1,000 mg daily by mouth.    Marland Kitchen UNABLE TO FIND Take 5 drops 2 (two) times daily by mouth. Med Name: [employer] CBD Oil     No current facility-administered medications for this visit.     Allergies:   Ace inhibitors and Penicillins    Social History:  The patient  reports that he quit  smoking about 2 years ago. His smoking use included cigarettes. He has a 100.00 pack-year smoking history. he has never used smokeless tobacco. He reports that he drinks alcohol. He reports that he does not use drugs.   Family History:  The patient's family history includes Cancer in his sister; Diabetes in his sister; Heart disease in his mother; Rheumatic fever in his brother and father.    ROS:  Please see the history of present illness.   Otherwise, review of systems are positive for none.   All other systems are reviewed and negative.    PHYSICAL EXAM: VS:  BP (!) 141/84   Pulse 63   Ht 5' 9.5" (1.765 m)   Wt 222 lb 12.8 oz (101.1 kg)   BMI 32.43 kg/m  , BMI Body mass index is 32.43 kg/m. GEN: Well nourished, well developed, in no acute distress  HEENT: normal  Neck: no JVD, carotid bruits, or  masses Cardiac: RRR; no murmurs, rubs, or gallops,no edema  Respiratory:  clear to auscultation bilaterally, normal work of breathing GI: soft, nontender, nondistended, + BS MS: no deformity or atrophy  Skin: warm and dry, no rash Neuro:  Strength and sensation are intact Psych: euthymic mood, full affect Vascular: Femoral pulses +2 on the right and +1 on the left.    EKG:  EKG is not ordered today.    Recent Labs: No results found for requested labs within last 8760 hours.    Lipid Panel    Component Value Date/Time   CHOL 159 12/23/2014 0923   TRIG 129 12/23/2014 0923   HDL 45 12/23/2014 0923   LDLCALC 88 12/23/2014 0923      Wt Readings from Last 3 Encounters:  01/10/18 222 lb 12.8 oz (101.1 kg)  12/12/17 229 lb 6.4 oz (104.1 kg)  10/10/17 228 lb (103.4 kg)       PAD Screen 01/25/2017  Previous PAD dx? No  Previous surgical procedure? Yes  Dates of procedures hx of bladder cancer  Pain with walking? Yes  Subsides with rest? Yes  Feet/toe relief with dangling? No  Painful, non-healing ulcers? No  Extremities discolored? Yes      ASSESSMENT AND PLAN:  1.  Abdominal aortic ectasia: Still stable on size on most recent ultrasound.  We can repeat study in 2 years.  2. Peripheral arterial disease: He has significant left external iliac artery stenosis.  He has no significant claudications and seems to be more limited by exertional dyspnea.  Continue to follow clinically and treat risk factors.  3. Hyperlipidemia: Continue treatment with atorvastatin with a target LDL of less than 70.   Disposition:   FU with me in 1 year  Signed,  Kathlyn Sacramento, MD  01/10/2018 10:05 AM    [place]

## 2018-03-16 DIAGNOSIS — L820 Inflamed seborrheic keratosis: Secondary | ICD-10-CM | POA: Diagnosis not present

## 2018-03-16 DIAGNOSIS — Z85828 Personal history of other malignant neoplasm of skin: Secondary | ICD-10-CM | POA: Diagnosis not present

## 2018-03-16 DIAGNOSIS — L570 Actinic keratosis: Secondary | ICD-10-CM | POA: Diagnosis not present

## 2018-03-16 DIAGNOSIS — D1801 Hemangioma of skin and subcutaneous tissue: Secondary | ICD-10-CM | POA: Diagnosis not present

## 2018-03-16 DIAGNOSIS — D225 Melanocytic nevi of trunk: Secondary | ICD-10-CM | POA: Diagnosis not present

## 2018-03-16 DIAGNOSIS — L821 Other seborrheic keratosis: Secondary | ICD-10-CM | POA: Diagnosis not present

## 2018-03-20 DIAGNOSIS — Z961 Presence of intraocular lens: Secondary | ICD-10-CM | POA: Diagnosis not present

## 2018-03-20 DIAGNOSIS — H04123 Dry eye syndrome of bilateral lacrimal glands: Secondary | ICD-10-CM | POA: Diagnosis not present

## 2018-04-04 DIAGNOSIS — I252 Old myocardial infarction: Secondary | ICD-10-CM | POA: Diagnosis not present

## 2018-04-04 DIAGNOSIS — I10 Essential (primary) hypertension: Secondary | ICD-10-CM | POA: Diagnosis not present

## 2018-04-04 DIAGNOSIS — I2510 Atherosclerotic heart disease of native coronary artery without angina pectoris: Secondary | ICD-10-CM | POA: Diagnosis not present

## 2018-04-04 DIAGNOSIS — E7800 Pure hypercholesterolemia, unspecified: Secondary | ICD-10-CM | POA: Diagnosis not present

## 2018-04-13 ENCOUNTER — Ambulatory Visit (INDEPENDENT_AMBULATORY_CARE_PROVIDER_SITE_OTHER): Payer: Medicare Other | Admitting: Pulmonary Disease

## 2018-04-13 ENCOUNTER — Encounter: Payer: Self-pay | Admitting: Pulmonary Disease

## 2018-04-13 VITALS — BP 132/76 | HR 60 | Ht 69.5 in | Wt 223.4 lb

## 2018-04-13 DIAGNOSIS — J301 Allergic rhinitis due to pollen: Secondary | ICD-10-CM | POA: Diagnosis not present

## 2018-04-13 DIAGNOSIS — J432 Centrilobular emphysema: Secondary | ICD-10-CM

## 2018-04-13 DIAGNOSIS — I2510 Atherosclerotic heart disease of native coronary artery without angina pectoris: Secondary | ICD-10-CM | POA: Diagnosis not present

## 2018-04-13 MED ORDER — INDACATEROL-GLYCOPYRROLATE 27.5-15.6 MCG IN CAPS: 1.0000 | ORAL_CAPSULE | Freq: Two times a day (BID) | RESPIRATORY_TRACT | 0 refills | Status: AC

## 2018-04-13 MED ORDER — UMECLIDINIUM-VILANTEROL 62.5-25 MCG/INH IN AEPB: 1.0000 | INHALATION_SPRAY | Freq: Every day | RESPIRATORY_TRACT | 0 refills | Status: AC

## 2018-04-13 NOTE — Progress Notes (Signed)
Subjective:    Patient ID: Steven Newman, male    DOB: May 02, 1941, 77 y.o.   MRN: [id number]  Synopsis: Came back to [facility] in 2016 after previously being followed by Dr. Joya Newman and Dr. Elsworth Newman for COPD and obstructive sleep apnea. Has a lengthy smoking history where he smoked as much as 2 packs of cigarettes daily and quit in 2014.  He started back smoking briefly but he quit again in July 2016.   HPI  Chief Complaint  Patient presents with  . Follow-up    pt states breathing is baseline. pt reports of nasal drainage clear in color & dry cough at times prod with white mucus   Steven Newman is about to travel to travel to [place] next week.  He is planning to stay in [place] on the way up.  He says his that his breathing has been doing pretty good. He has been working a lot around his house in some cedar tress.  He has been having a runny nose for this. He is taking Zyrtec.    He is not taking Utibron right now.  He was given a $153 dollar a month copay for this.  They have been working with the [place] as well and he is going to see pulmonary there.  Past Medical History:  Diagnosis Date  . CAD (coronary artery disease)    coronary angioplasty 1995  . Dyspnea   . Erectile dysfunction   . History of bladder cancer   . Hx of colonic polyps   . Hyperlipidemia   . Hypertension   . Kidney stones   . MI (myocardial infarction) ([place])    1976, 1985  . Obesity   . Pneumonia   . Skin cancer   . Type 2 diabetes mellitus ([place])   . Wheezing      Review of Systems  Constitutional: Positive for fatigue. Negative for chills and fever.  HENT: Negative for postnasal drip, rhinorrhea and sinus pressure.   Respiratory: Negative for cough, shortness of breath and wheezing.   Cardiovascular: Negative for chest pain, palpitations and leg swelling.       Objective:   Physical Exam Vitals:   04/13/18 1008  BP: 132/76  Pulse: 60  SpO2: 92%  Weight: 223 lb 6.4 oz (101.3 kg)  Height: 5' 9.5" (1.765  m)    RA  Gen: well appearing HENT: OP clear, TM's clear, neck supple PULM: CTA B, normal percussion CV: RRR, no mgr, trace edema GI: BS+, soft, nontender Derm: no cyanosis or rash Psyche: normal mood and affect   PFT: Simple spirometry August 2016 showed clear airflow obstruction with an FEV1 of 1.3 L (42% predicted), FVC 1.7 L, 47% predicted       Assessment & Plan:   Centrilobular emphysema (HCC)  Seasonal allergic rhinitis due to pollen  Discussion: This has been a stable interval for Steven Newman.  He has some allergic rhinitis symptoms right now which would be better controlled with the use of a nasal steroid.  We reviewed this today.  I am concerned about the fact that he is not taking a controller medicine for his COPD.  He has had exacerbations in the past when he is not been on the medicine.  The problem is that the co-pay is too high, he is going to try to work with the [place] to see if he can get his medicine from them.  Plan: COPD with history of exacerbations: Continue taking Utibron twice a  day until seen by [facility]. Because I believe they will want you to switch to Anoro I am going to give you a sample of that medicine which is to be taken 1 puff daily.  Once you have used up your supply of Utibron you can change to Anoro. Practice good hand hygiene Stay away from cigarettes  Allergic rhinitis: Continue cetirizine daily If symptoms are worsening then take nasal fluticasone 2 sprays each nostril daily  Follow-up with me in 6 months or sooner if needed   Current Outpatient Medications:  .  albuterol (PROVENTIL HFA;VENTOLIN HFA) 108 (90 Base) MCG/ACT inhaler, Inhale 2 puffs into the lungs every 6 (six) hours as needed for wheezing or shortness of breath., Disp: , Rfl:  .  albuterol (PROVENTIL) (2.5 MG/3ML) 0.083% nebulizer solution, Take 3 mLs (2.5 mg total) by nebulization every 4 (four) hours as needed for shortness of breath., Disp: 75 mL, Rfl: 3 .  aspirin 81 MG  tablet, Take 81 mg by mouth daily., Disp: , Rfl:  .  atorvastatin (LIPITOR) 40 MG tablet, TAKE 1 TABLET BY MOUTH DAILY. Please keep upcoming appt in January. Thank you, Disp: 90 tablet, Rfl: 3 .  Cholecalciferol (VITAMIN D) 2000 UNITS tablet, Take 2,000 Units by mouth daily., Disp: , Rfl:  .  Coenzyme Q10 (CO Q-10) 100 MG CAPS, Take 1 capsule by mouth daily., Disp: , Rfl:  .  nadolol (CORGARD) 40 MG tablet, Take 1 tablet (40 mg total) by mouth daily., Disp: 90 tablet, Rfl: 3 .  nitroGLYCERIN (NITROSTAT) 0.4 MG SL tablet, Place 0.4 mg under the tongue every 5 (five) minutes as needed for chest pain., Disp: , Rfl:  .  Omega-3 Fatty Acids (RA FISH OIL) 1400 MG CPDR, Take 1 capsule by mouth daily. , Disp: , Rfl:  .  PRESCRIPTION MEDICATION, Take 1 tablet by mouth daily. Triflex for arthritis as needed , Disp: , Rfl:  .  TURMERIC PO, Take 1,000 mg daily by mouth., Disp: , Rfl:  .  UNABLE TO FIND, Take 5 drops 2 (two) times daily by mouth. Med Name: [employer] CBD [employer], Disp: , Rfl:

## 2018-04-13 NOTE — Patient Instructions (Signed)
COPD with history of exacerbations: Continue taking Utibron twice a day until seen by [place]. Because I believe they will want you to switch to Anoro I am going to give you a sample of that medicine which is to be taken 1 puff daily.  Once you have used up your supply of Utibron you can change to Anoro. Practice good hand hygiene Stay away from cigarettes  Allergic rhinitis: Continue cetirizine daily If symptoms are worsening then take nasal fluticasone 2 sprays each nostril daily  Follow-up with me in 6 months or sooner if needed

## 2018-05-17 DIAGNOSIS — E119 Type 2 diabetes mellitus without complications: Secondary | ICD-10-CM | POA: Diagnosis not present

## 2018-05-17 DIAGNOSIS — D696 Thrombocytopenia, unspecified: Secondary | ICD-10-CM | POA: Diagnosis not present

## 2018-06-13 ENCOUNTER — Encounter: Payer: Self-pay | Admitting: Hematology & Oncology

## 2018-06-20 DIAGNOSIS — Z570 Occupational exposure to noise: Secondary | ICD-10-CM | POA: Diagnosis not present

## 2018-06-20 DIAGNOSIS — H903 Sensorineural hearing loss, bilateral: Secondary | ICD-10-CM | POA: Diagnosis not present

## 2018-06-20 DIAGNOSIS — H9313 Tinnitus, bilateral: Secondary | ICD-10-CM | POA: Diagnosis not present

## 2018-06-21 ENCOUNTER — Other Ambulatory Visit: Payer: Self-pay | Admitting: Family

## 2018-06-21 DIAGNOSIS — D696 Thrombocytopenia, unspecified: Secondary | ICD-10-CM

## 2018-06-22 ENCOUNTER — Encounter: Payer: Self-pay | Admitting: Family

## 2018-06-22 ENCOUNTER — Inpatient Hospital Stay: Payer: Medicare Other | Attending: Family | Admitting: Family

## 2018-06-22 ENCOUNTER — Inpatient Hospital Stay: Payer: Medicare Other

## 2018-06-22 ENCOUNTER — Other Ambulatory Visit: Payer: Self-pay

## 2018-06-22 ENCOUNTER — Telehealth: Payer: Self-pay | Admitting: Pulmonary Disease

## 2018-06-22 VITALS — BP 158/77 | HR 61 | Temp 97.9°F | Resp 19 | Wt 225.0 lb

## 2018-06-22 DIAGNOSIS — D696 Thrombocytopenia, unspecified: Secondary | ICD-10-CM

## 2018-06-22 DIAGNOSIS — Z87891 Personal history of nicotine dependence: Secondary | ICD-10-CM | POA: Diagnosis not present

## 2018-06-22 DIAGNOSIS — Z7982 Long term (current) use of aspirin: Secondary | ICD-10-CM | POA: Diagnosis not present

## 2018-06-22 DIAGNOSIS — I2510 Atherosclerotic heart disease of native coronary artery without angina pectoris: Secondary | ICD-10-CM | POA: Insufficient documentation

## 2018-06-22 LAB — CBC WITH DIFFERENTIAL (CANCER CENTER ONLY)
Basophils Absolute: 0 10*3/uL (ref 0.0–0.1)
Basophils Relative: 0 %
Eosinophils Absolute: 0.4 10*3/uL (ref 0.0–0.5)
Eosinophils Relative: 5 %
HEMATOCRIT: 46.3 % (ref 38.7–49.9)
Hemoglobin: 15.3 g/dL (ref 13.0–17.1)
LYMPHS PCT: 18 %
Lymphs Abs: 1.3 10*3/uL (ref 0.9–3.3)
MCH: 31.2 pg (ref 28.0–33.4)
MCHC: 33 g/dL (ref 32.0–35.9)
MCV: 94.3 fL (ref 82.0–98.0)
Monocytes Absolute: 0.9 10*3/uL (ref 0.1–0.9)
Monocytes Relative: 12 %
NEUTROS ABS: 4.5 10*3/uL (ref 1.5–6.5)
Neutrophils Relative %: 65 %
Platelet Count: 126 10*3/uL — ABNORMAL LOW (ref 145–400)
RBC: 4.91 MIL/uL (ref 4.20–5.70)
RDW: 14.4 % (ref 11.1–15.7)
WBC Count: 7 10*3/uL (ref 4.0–10.0)

## 2018-06-22 LAB — CMP (CANCER CENTER ONLY)
ALBUMIN: 3.9 g/dL (ref 3.5–5.0)
ALK PHOS: 75 U/L (ref 38–126)
ALT: 32 U/L (ref 0–44)
AST: 22 U/L (ref 15–41)
Anion gap: 6 (ref 5–15)
BILIRUBIN TOTAL: 0.7 mg/dL (ref 0.3–1.2)
BUN: 25 mg/dL — AB (ref 8–23)
CO2: 23 mmol/L (ref 22–32)
Calcium: 9 mg/dL (ref 8.9–10.3)
Chloride: 109 mmol/L (ref 98–111)
Creatinine: 1.18 mg/dL (ref 0.61–1.24)
GFR, Est AFR Am: >60 mL/min (ref >60)
GFR, Estimated: 57 mL/min — ABNORMAL LOW (ref >60)
Glucose, Bld: 143 mg/dL — ABNORMAL HIGH (ref 70–99)
Potassium: 4.8 mmol/L (ref 3.5–5.1)
Sodium: 138 mmol/L (ref 135–145)
TOTAL PROTEIN: 7.3 g/dL (ref 6.5–8.1)

## 2018-06-22 LAB — PLATELET BY CITRATE

## 2018-06-22 LAB — SAVE SMEAR

## 2018-06-22 LAB — LACTATE DEHYDROGENASE: LDH: 222 U/L — ABNORMAL HIGH (ref 98–192)

## 2018-06-22 MED ORDER — UMECLIDINIUM-VILANTEROL 62.5-25 MCG/INH IN AEPB: 1.0000 | INHALATION_SPRAY | Freq: Every day | RESPIRATORY_TRACT | 5 refills | Status: DC

## 2018-06-22 NOTE — Telephone Encounter (Signed)
Spoke with pt's wife, Dewaine Oats. Pt was given a sample of Anoro at his last OV. They are requesting a prescription for this. Rx has been sent in. Nothing further was needed.

## 2018-06-22 NOTE — Progress Notes (Signed)
Hematology/Oncology Consultation   Name: Steven Newman      MRN: [id number]    Location: Room/bed info not found  Date: 06/22/2018 Time:12:18 PM   REFERRING PHYSICIAN: Leeroy Cha, MD  REASON FOR CONSULT: Thrombocytopenia    DIAGNOSIS: Mild thrombocytopenia   HISTORY OF PRESENT ILLNESS: [name] is a very pleasant 77 yo caucasian gentleman with a low platelet count for at least the last 3 years. Today's count is stable at 126. No anemia and WBC count with diff is within normal limits.  He is asymptomatic with this and has had no episodes of bleeding, no bruising or petechiae.  He had 2 heart attacks at he age of 12 (37) and another in the eraly 90's requiring an angioplasty.  He takes a baby aspirin daily.  He had history of bladder cancer in 1995 and was treated with liquid chemotherapy in the bladder. So far, there has been no evidence of recurrence. Since then he has had issues with urinary urgency and occasional incontinence at times.  He has had multiple skin cancers removed as has his sister.  He states that he has had colonoscopies in the past with history of benign polyps.  He has had no issue with infections. No fever, chills, n/v, cough, rash, dizziness, SOB, chest pain, palpitations, abdominal pain or changes in bowel or bladder habits.  No swelling, tenderness, numbness or tingling in her extremities. No lymphadenopathy noted on exam.  He is diabetic but states that this is controlled with diet and not medication at this time.  He has maintained a good appetite and is staying well hydrated. His weight is stable.  He is a former smoker while in the [employer]. He does not drink alcoholic beverages.  He did 2 tours in [place] with the [employer] and states that he was exposed to [employer]. 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   ROS: All other 10 point review of systems is negative.   PAST MEDICAL HISTORY:   Past Medical History:   Diagnosis Date  . CAD (coronary artery disease)    coronary angioplasty 1995  . Dyspnea   . Erectile dysfunction   . History of bladder cancer   . Hx of colonic polyps   . Hyperlipidemia   . Hypertension   . Kidney stones   . MI (myocardial infarction) ([place])    1976, 1985  . Obesity   . Pneumonia   . Skin cancer   . Type 2 diabetes mellitus ([place])   . Wheezing     ALLERGIES: Allergies  Allergen Reactions  . Ace Inhibitors     Angioedema   . Penicillins Swelling and Rash    Lips swell      MEDICATIONS:  Current Outpatient Medications on File Prior to Visit  Medication Sig Dispense Refill  . albuterol (PROVENTIL HFA;VENTOLIN HFA) 108 (90 Base) MCG/ACT inhaler Inhale 2 puffs into the lungs every 6 (six) hours as needed for wheezing or shortness of breath.    Marland Kitchen aspirin 81 MG tablet Take 81 mg by mouth daily.    Marland Kitchen atorvastatin (LIPITOR) 40 MG tablet TAKE 1 TABLET BY MOUTH DAILY. Please keep upcoming appt in January. Thank you 90 tablet 3  . Cholecalciferol (VITAMIN D) 2000 UNITS tablet Take 2,000 Units by mouth daily.    . Coenzyme Q10 (CO Q-10) 100 MG CAPS Take 1 capsule by mouth daily.    . nadolol (CORGARD)  40 MG tablet Take 1 tablet (40 mg total) by mouth daily. 90 tablet 3  . nitroGLYCERIN (NITROSTAT) 0.4 MG SL tablet Place 0.4 mg under the tongue every 5 (five) minutes as needed for chest pain.    . Omega-3 Fatty Acids (RA FISH OIL) 1400 MG CPDR Take 1 capsule by mouth daily.     Marland Kitchen PRESCRIPTION MEDICATION Take 1 tablet by mouth daily. Triflex for arthritis as needed     . TURMERIC PO Take 1,000 mg daily by mouth.    Marland Kitchen UNABLE TO FIND Take 5 drops 2 (two) times daily by mouth. Med Name: [employer] CBD Oil     No current facility-administered medications on file prior to visit.      PAST SURGICAL HISTORY Past Surgical History:  Procedure Laterality Date  . bladder cancer surgery    . 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 neck tumor      FAMILY HISTORY: Family  History  Problem Relation Age of Onset  . Heart disease Mother   . Rheumatic fever Father   . Rheumatic fever Brother   . Diabetes Sister   . Cancer Sister        cancer on eyelid    SOCIAL HISTORY:  reports that he quit smoking about 3 years ago. His smoking use included cigarettes. He has a 100.00 pack-year smoking history. He has never used smokeless tobacco. He reports that he drinks alcohol. He reports that he does not use drugs.  PERFORMANCE STATUS: The patient's performance status is 1 - Symptomatic but completely ambulatory  PHYSICAL EXAM: Most Recent Vital Signs: Blood pressure (!) 158/77, pulse 61, temperature 97.9 F (36.6 C), temperature source Oral, resp. rate 19, weight 225 lb (102.1 kg), SpO2 97 %. BP (!) 158/77 (BP Location: Left Arm, Patient Position: Sitting)   Pulse 61   Temp 97.9 F (36.6 C) (Oral)   Resp 19   Wt 225 lb (102.1 kg)   SpO2 97%   BMI 32.75 kg/m   General Appearance:    Alert, cooperative, no distress, appears stated age  Head:    Normocephalic, without obvious abnormality, atraumatic  Eyes:    PERRL, conjunctiva/corneas clear, EOM's intact, fundi    benign, both eyes             Throat:   Lips, mucosa, and tongue normal; teeth and gums normal  Neck:   Supple, symmetrical, trachea midline, no adenopathy;       thyroid:  No enlargement/tenderness/nodules; no carotid   bruit or JVD  Back:     Symmetric, no curvature, ROM normal, no CVA tenderness  Lungs:     Clear to auscultation bilaterally, respirations unlabored  Chest wall:    No tenderness or deformity  Heart:    Regular rate and rhythm, S1 and S2 normal, no murmur, rub   or gallop  Abdomen:     Soft, non-tender, bowel sounds active all four quadrants,    no masses, no organomegaly        Extremities:   Extremities normal, atraumatic, no cyanosis or edema  Pulses:   2+ and symmetric all extremities  Skin:   Skin color, texture, turgor normal, no rashes or lesions  Lymph nodes:    Cervical, supraclavicular, and axillary nodes normal  Neurologic:   CNII-XII intact. Normal strength, sensation and reflexes      throughout    LABORATORY DATA:  Results for orders placed or performed in visit on 06/22/18 (from  the past 48 hour(s))  CBC with Differential ([place] Only)     Status: Abnormal   Collection Time: 06/22/18 10:03 AM  Result Value Ref Range   WBC Count 7.0 4.0 - 10.0 K/uL   RBC 4.91 4.20 - 5.70 MIL/uL   Hemoglobin 15.3 13.0 - 17.1 g/dL   HCT 46.3 38.7 - 49.9 %   MCV 94.3 82.0 - 98.0 fL   MCH 31.2 28.0 - 33.4 pg   MCHC 33.0 32.0 - 35.9 g/dL   RDW 14.4 11.1 - 15.7 %   Platelet Count 126 (L) 145 - 400 K/uL   Neutrophils Relative % 65 %   Neutro Abs 4.5 1.5 - 6.5 K/uL   Lymphocytes Relative 18 %   Lymphs Abs 1.3 0.9 - 3.3 K/uL   Monocytes Relative 12 %   Monocytes Absolute 0.9 0.1 - 0.9 K/uL   Eosinophils Relative 5 %   Eosinophils Absolute 0.4 0.0 - 0.5 K/uL   Basophils Relative 0 %   Basophils Absolute 0.0 0.0 - 0.1 K/uL    Comment: Performed at [facility] at [facility], [address] [place], [place] [address]  CMP ([place] only)     Status: Abnormal   Collection Time: 06/22/18 10:03 AM  Result Value Ref Range   Sodium 138 135 - 145 mmol/L   Potassium 4.8 3.5 - 5.1 mmol/L   Chloride 109 98 - 111 mmol/L    Comment: Please note change in reference range.   CO2 23 22 - 32 mmol/L   Glucose, Bld 143 (H) 70 - 99 mg/dL    Comment: Please note change in reference range.   BUN 25 (H) 8 - 23 mg/dL    Comment: Please note change in reference range.   Creatinine 1.18 0.61 - 1.24 mg/dL   Calcium 9.0 8.9 - 10.3 mg/dL   Total Protein 7.3 6.5 - 8.1 g/dL   Albumin 3.9 3.5 - 5.0 g/dL   AST 22 15 - 41 U/L   ALT 32 0 - 44 U/L   Alkaline Phosphatase 75 38 - 126 U/L   Total Bilirubin 0.7 0.3 - 1.2 mg/dL   GFR, Est Non Af Am 57 (L) >60 mL/min   GFR, Est AFR Am >60 >60 mL/min    Comment: (NOTE) The eGFR has been  calculated using the CKD EPI equation. This calculation has not been validated in all clinical situations. eGFR's persistently <60 mL/min signify possible Chronic Kidney Disease.    Anion gap 6 5 - 15    Comment: Performed at [facility], [address] [place], [place] [address]      RADIOGRAPHY: No results found.     PATHOLOGY: None   ASSESSMENT/PLAN: Mr. Labreck is a very pleasant 77 yo caucasian gentleman with a low platelet count for at least the last 3 years. Platelet count today is stable at  126, no anemia and WBC count is 126. He remains asymptomatic so we will continue to watch for now.  We will plan to see him, back in another 4 months for follow-up.   All questions were answered and he is in agreement with the plan. He will contact our office with any questions or concerns. We can certainly see him sooner if need be.   He was discussed with and also seen by Dr. Marin Olp and he is in agreement with the aforementioned.   Laverna Peace      Addendum: I  saw and examined the patient with Judson Roch.  I agree with her above assessment.  I really do not see much of a problem with his platelet count of 126,000.  I believe his blood smear.  I do not see anything on the blood smear that looked as if that there was a hematologic malignancy.  At 77 years old, I suppose that there could be an element of myelodysplasia.  However, a bone marrow biopsy would be necessary for this.  He does not have any problems with his white blood cells.  He does not have any problems with his red blood cells.  Has a normal white cell differential.  His MCV is normal.  I think that we can just follow Mr. Magoon labs.  He has had the thrombocytopenia for several years.  Here nothing really has changed with his blood counts.  His physical exam is unremarkable.  There is no lymphadenopathy.  There is no splenomegaly.  We will have him come back in 4 months.  If everything looks good  in 4 months, the may be we can let him go from the practice.  We spent about 40 minutes with him.  All the time spent face-to-face.  We counseled him and coordinated care for him.  Lattie Haw, MD

## 2018-06-22 NOTE — Telephone Encounter (Signed)
Attempted to call pt's wife, Dewaine Oats. I did not receive an answer. I have left a message for Dewaine Oats to return our call.

## 2018-07-04 ENCOUNTER — Ambulatory Visit: Payer: Medicare Other | Admitting: Cardiology

## 2018-07-20 DIAGNOSIS — M25562 Pain in left knee: Secondary | ICD-10-CM | POA: Diagnosis not present

## 2018-07-20 DIAGNOSIS — Z6832 Body mass index (BMI) 32.0-32.9, adult: Secondary | ICD-10-CM | POA: Diagnosis not present

## 2018-07-20 DIAGNOSIS — M150 Primary generalized (osteo)arthritis: Secondary | ICD-10-CM | POA: Diagnosis not present

## 2018-07-20 DIAGNOSIS — E669 Obesity, unspecified: Secondary | ICD-10-CM | POA: Diagnosis not present

## 2018-07-20 DIAGNOSIS — M2550 Pain in unspecified joint: Secondary | ICD-10-CM | POA: Diagnosis not present

## 2018-07-27 ENCOUNTER — Encounter: Payer: Self-pay | Admitting: Cardiology

## 2018-07-27 ENCOUNTER — Ambulatory Visit (INDEPENDENT_AMBULATORY_CARE_PROVIDER_SITE_OTHER): Payer: Medicare Other | Admitting: Cardiology

## 2018-07-27 VITALS — BP 126/70 | HR 61 | Ht 69.5 in | Wt 229.0 lb

## 2018-07-27 DIAGNOSIS — I2510 Atherosclerotic heart disease of native coronary artery without angina pectoris: Secondary | ICD-10-CM | POA: Diagnosis not present

## 2018-07-27 DIAGNOSIS — R5383 Other fatigue: Secondary | ICD-10-CM | POA: Diagnosis not present

## 2018-07-27 DIAGNOSIS — R0600 Dyspnea, unspecified: Secondary | ICD-10-CM | POA: Diagnosis not present

## 2018-07-27 NOTE — Progress Notes (Signed)
07/27/2018 [place]   1941-07-29  [id number]  Primary Physician Leeroy Cha, MD Primary Cardiologist: Dr. Irish Lack  PVD: Dr. Fletcher Anon  Reason for Visit/CC: F/u for CAD  HPI:  Steven Newman is a 77 y.o. male who presents to clinic today for routine f/u for CAD.  PMH is notable for CAD s/p several MIs in the past and a PTCA in the mid 90s. He has not required any recent revascularizations. He also has PVD, followed by Dr. Fletcher Anon. He is known to have abdominal aortic ectasia which has been followed by ultrasound. 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 Other chronic medical conditions include HTN and HLD. He is a former smoker. Dr. Irish Lack is his primary cardiologist.   Since his last office visit, he had a bout of shingles.  He was also recently referred to hematology by his PCP due to thrombocytopenia.  He was seen by Dr. Marin Olp.  His platelet count was 126 and appeared to be stable over the past 3 years.  His hemoglobin and white blood cell count were normal.  Dr. Marin Olp did not feel that there is any concern for malignancy.  He opted to monitor him every 4 months.  Patient was told that it was okay to continue aspirin.  He presents to clinic today for follow-up with his wife.  He denies any chest pain.  He occasionally has exertional dyspnea and exertional fatigue however no significant resting dyspnea.  No orthopnea or PND or lower extremity edema.  His wife recalls that several weeks ago he did have some upper extremity swelling that resolved after placement of an ice pack.  Patient had endorsed to his wife that he had bumped his elbow the day prior.  There is no edema present on exam today.  Lungs are clear to auscultation bilaterally.  Blood pressure is well controlled at 126/70.  Pulse rate 61 bpm.  He reports full medication compliance.  Patient and his wife reports that his PCP  recently checked basic laboratory work including a lipid panel.  These results are not available for my review today.   Current Meds  Medication Sig  . aspirin 81 MG tablet Take 81 mg by mouth daily.  Marland Kitchen atorvastatin (LIPITOR) 40 MG tablet TAKE 1 TABLET BY MOUTH DAILY. Please keep upcoming appt in January. Thank you  . Cholecalciferol (VITAMIN D) 2000 UNITS tablet Take 2,000 Units by mouth daily.  . Coenzyme Q10 (CO Q-10) 100 MG CAPS Take 1 capsule by mouth daily.  . nadolol (CORGARD) 40 MG tablet Take 1 tablet (40 mg total) by mouth daily.  . nitroGLYCERIN (NITROSTAT) 0.4 MG SL tablet Place 0.4 mg under the tongue every 5 (five) minutes as needed for chest pain.  . Omega-3 Fatty Acids (RA FISH OIL) 1400 MG CPDR Take 1 capsule by mouth daily.   Marland Kitchen PRESCRIPTION MEDICATION Take 1 tablet by mouth daily. Triflex for arthritis as needed   . TURMERIC PO Take 1,000 mg daily by mouth.  . umeclidinium-vilanterol (ANORO ELLIPTA) 62.5-25 MCG/INH AEPB Inhale 1 puff into the lungs daily.  Marland Kitchen UNABLE TO FIND Take 5 drops 2 (two) times daily by mouth. Med Name: [employer] CBD Oil   Allergies  Allergen Reactions  . Ace Inhibitors     Angioedema   . Penicillins Swelling and Rash    Lips swell   Past Medical History:  Diagnosis Date  . CAD (coronary  artery disease)    coronary angioplasty 1995  . Dyspnea   . Erectile dysfunction   . History of bladder cancer   . Hx of colonic polyps   . Hyperlipidemia   . Hypertension   . Kidney stones   . MI (myocardial infarction) ([place])    1976, 1985  . Obesity   . Pneumonia   . Skin cancer   . Type 2 diabetes mellitus ([place])   . Wheezing    Family History  Problem Relation Age of Onset  . Heart disease Mother   . Rheumatic fever Father   . Rheumatic fever Brother   . Diabetes Sister   . Cancer Sister        cancer on eyelid   Past Surgical History:  Procedure Laterality Date  . bladder cancer surgery    . 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 neck  tumor     Social History   Socioeconomic History  . Marital status: Married    Spouse name: Not on file  . Number of children: Not on file  . Years of education: Not on file  . Highest education level: Not on file  Occupational History  . Occupation: Retired  Scientific laboratory technician  . Financial resource strain: Not on file  . Food insecurity:    Worry: Not on file    Inability: Not on file  . Transportation needs:    Medical: Not on file    Non-medical: Not on file  Tobacco Use  . Smoking status: Former Smoker    Packs/day: 2.00    Years: 50.00    Pack years: 100.00    Types: Cigarettes    Last attempt to quit: 06/20/2015    Years since quitting: 3.1  . Smokeless tobacco: Never Used  . Tobacco comment: now down to afew cigarettes per day based on anxiety level  Substance and Sexual Activity  . Alcohol use: Yes    Alcohol/week: 0.0 standard drinks    Comment: socially  . Drug use: No  . Sexual activity: Not on file  Lifestyle  . Physical activity:    Days per week: Not on file    Minutes per session: Not on file  . Stress: Not on file  Relationships  . Social connections:    Talks on phone: Not on file    Gets together: Not on file    Attends religious service: Not on file    Active member of club or organization: Not on file    Attends meetings of clubs or organizations: Not on file    Relationship status: Not on file  . Intimate partner violence:    Fear of current or ex partner: Not on file    Emotionally abused: Not on file    Physically abused: Not on file    Forced sexual activity: Not on file  Other Topics Concern  . Not on file  Social History Narrative  . Not on file     Review of Systems: General: negative for chills, fever, night sweats or weight changes.  Cardiovascular: negative for chest pain, dyspnea on exertion, edema, orthopnea, palpitations, paroxysmal nocturnal dyspnea or shortness of breath Dermatological: negative for rash Respiratory: negative  for cough or wheezing Urologic: negative for hematuria Abdominal: negative for nausea, vomiting, diarrhea, bright red blood per rectum, melena, or hematemesis Neurologic: negative for visual changes, syncope, or dizziness All other systems reviewed and are otherwise negative except as noted above.   Physical  Exam:  Blood pressure 126/70, pulse 61, height 5' 9.5" (1.765 m), weight 229 lb (103.9 kg), SpO2 96 %.  General appearance: alert, cooperative and no distress Neck: no carotid bruit and no JVD Lungs: clear to auscultation bilaterally Heart: regular rate and rhythm, S1, S2 normal, no murmur, click, rub or gallop Extremities: extremities normal, atraumatic, no cyanosis or edema Pulses: 2+ and symmetric Skin: Skin color, texture, turgor normal. No rashes or lesions Neurologic: Grossly normal  EKG not performed -- personally reviewed   ASSESSMENT AND PLAN:   1. CAD: s/p several MIs in the past and a PTCA in the mid 90s. He has not required any recent revascularizations.  He denies exertional chest pain.  Given problem #2 we will at least obtain a repeat echocardiogram to assess overall cardiac function.  He has close follow-up with Dr. Irish Lack in October.  Decision can be made at that time if he needs to have stress testing for surveillance since it has been several years since ischemic testing.  For now, continue medical therapy with aspirin, beta-blocker and statin.  2. Exertional Fatigue: Pt denies any associated chest pain.  He occasionally has exertional dyspnea with moderate physical activity but no dyspnea with basic ADLs.  No orthopnea or PND.  He appears euvolemic on physical exam.  No edema.  Lungs are clear to auscultation bilaterally.  He does have a history of CAD with several MIs dating back to the 51s and 1990s.  His last intervention was PTCA in the late 1990s.  His last echocardiogram was in 2011.  We will check a repeat echocardiogram to assess overall cardiac function  including systolic function, wall motion, valvular anatomy.  3. HTN: Controlled on current regimen.  No changes made today.  4. HLD: on statin therapy with Lipitor.  His last LDL that we have on file was from 2018 and LDL was controlled at that time at 71 mg/dL.  He reports that he has had a repeat lipid panel since then at his PCP office.  Will contact PCP office to see if results can be faxed to our office for review today.  5. PVD: As outlined above.  He is followed by Dr. Fletcher Anon yearly.  He denies any claudication symptoms.  6. Thrombocytopenia: followed by hematology.  Recently seen by Dr. Marin Olp and recent platelet count was 126 which appears to be stable over the past 3 years.  He denies any abnormal bleeding.  Plan per hematology is to continue to monitor.  He has follow-up in 4 months.  Patient reports that he was told it was okay to continue low-dose daily aspirin.  He is on 81 mg daily.  Follow-Up: Follow-up with Dr. Irish Lack in October.   Dewaine Morocho Ladoris Gene, MHS [facility] 07/27/2018 9:50 AM

## 2018-07-27 NOTE — Patient Instructions (Signed)
Medication Instructions: Your physician recommends that you continue on your current medications as directed. Please refer to the Current Medication list given to you today.   Labwork: None Ordered  Procedures/Testing: Your physician has requested that you have an echocardiogram. Echocardiography is a painless test that uses sound waves to create images of your heart. It provides your doctor with information about the size and shape of your heart and how well your heart's chambers and valves are working. This procedure takes approximately one hour. There are no restrictions for this procedure.    Follow-Up: Please keep your follow up appointment with Dr.Varanasi on 09/25/18  Any Additional Special Instructions Will Be Listed Below (If Applicable).     If you need a refill on your cardiac medications before your next appointment, please call your pharmacy.

## 2018-08-09 ENCOUNTER — Ambulatory Visit (HOSPITAL_COMMUNITY): Payer: Medicare Other | Attending: Cardiovascular Disease

## 2018-08-09 ENCOUNTER — Other Ambulatory Visit: Payer: Self-pay

## 2018-08-09 DIAGNOSIS — R0600 Dyspnea, unspecified: Secondary | ICD-10-CM | POA: Diagnosis not present

## 2018-08-09 DIAGNOSIS — I2510 Atherosclerotic heart disease of native coronary artery without angina pectoris: Secondary | ICD-10-CM | POA: Diagnosis not present

## 2018-08-09 DIAGNOSIS — J189 Pneumonia, unspecified organism: Secondary | ICD-10-CM | POA: Insufficient documentation

## 2018-08-09 DIAGNOSIS — I119 Hypertensive heart disease without heart failure: Secondary | ICD-10-CM | POA: Insufficient documentation

## 2018-08-09 DIAGNOSIS — E785 Hyperlipidemia, unspecified: Secondary | ICD-10-CM | POA: Insufficient documentation

## 2018-08-09 DIAGNOSIS — Z87891 Personal history of nicotine dependence: Secondary | ICD-10-CM | POA: Insufficient documentation

## 2018-08-09 DIAGNOSIS — E119 Type 2 diabetes mellitus without complications: Secondary | ICD-10-CM | POA: Insufficient documentation

## 2018-08-09 DIAGNOSIS — R5383 Other fatigue: Secondary | ICD-10-CM

## 2018-08-09 DIAGNOSIS — I219 Acute myocardial infarction, unspecified: Secondary | ICD-10-CM | POA: Insufficient documentation

## 2018-08-09 DIAGNOSIS — Z6833 Body mass index (BMI) 33.0-33.9, adult: Secondary | ICD-10-CM | POA: Diagnosis not present

## 2018-08-09 DIAGNOSIS — E669 Obesity, unspecified: Secondary | ICD-10-CM | POA: Insufficient documentation

## 2018-08-09 DIAGNOSIS — I080 Rheumatic disorders of both mitral and aortic valves: Secondary | ICD-10-CM | POA: Insufficient documentation

## 2018-09-22 NOTE — Progress Notes (Signed)
Cardiology Office Note   Date:  09/25/2018   ID:  Steven Newman, DOB 1941-07-03, MRN [id number]  PCP:  Steven Cha, MD    No chief complaint on file.  CAD  Wt Readings from Last 3 Encounters:  09/25/18 230 lb (104.3 kg)  07/27/18 229 lb (103.9 kg)  06/22/18 225 lb (102.1 kg)       History of Present Illness: Steven Newman is a 77 y.o. male  with CAD with several MIs in the past and a PTCA in the mid 90s.   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  He has been treated medically due to lack of claudication. He has been doing reasonably well and denies any chest pain.  He reports chronic exertional dyspnea which limits his activities.  He is actually more limited by this than anything else.  He does not experience claudication.  Denies : Chest pain. Dizziness. Leg edema. Nitroglycerin use. Orthopnea. Palpitations. Paroxysmal nocturnal dyspnea.  Syncope.   He has not been walking much, regularly.   He did go to [place] in 8/19.  He had trouble walking because of the heat.    Echo in 9/19: Left ventricle: Septal apical and inferior basal hypokinesis.   Wall thickness was increased in a pattern of severe LVH. Systolic   function was normal. The estimated ejection fraction was in the   range of 50% to 55%. Left ventricular diastolic function   parameters were normal. - Aortic valve: There was trivial regurgitation. - Mitral valve: There was mild regurgitation. - Left atrium: The atrium was moderately dilated.  He is followed for COPD.    Past Medical History:  Diagnosis Date  . CAD (coronary artery disease)    coronary angioplasty 1995  . Dyspnea   . Erectile dysfunction   . History of bladder cancer   . Hx of colonic polyps   . Hyperlipidemia   . Hypertension   . Kidney stones   . MI (myocardial infarction) ([place])    1976, 1985  . Obesity   . Pneumonia   . Skin cancer   . Type 2  diabetes mellitus ([place])   . Wheezing     Past Surgical History:  Procedure Laterality Date  . bladder cancer surgery    . 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 neck tumor       Current Outpatient Medications  Medication Sig Dispense Refill  . albuterol (PROVENTIL) (2.5 MG/3ML) 0.083% nebulizer solution Take 2.5 mg by nebulization as needed for wheezing or shortness of breath.    Marland Kitchen aspirin 81 MG tablet Take 81 mg by mouth daily.    Marland Kitchen atorvastatin (LIPITOR) 40 MG tablet TAKE 1 TABLET BY MOUTH DAILY. Please keep upcoming appt in January. Thank you 90 tablet 3  . Cholecalciferol (VITAMIN D) 2000 UNITS tablet Take 2,000 Units by mouth daily.    . Coenzyme Q10 (CO Q-10) 100 MG CAPS Take 1 capsule by mouth daily.    . nadolol (CORGARD) 40 MG tablet Take 1 tablet (40 mg total) by mouth daily. 90 tablet 3  . nitroGLYCERIN (NITROSTAT) 0.4 MG SL tablet Place 0.4 mg under the tongue every 5 (five) minutes as needed for chest pain.    . Omega-3 Fatty Acids (RA FISH OIL) 1400 MG CPDR Take 1 capsule by mouth daily.     Marland Kitchen PRESCRIPTION MEDICATION Take 1 tablet by mouth daily. Triflex for  arthritis as needed     . TURMERIC PO Take 1,000 mg daily by mouth.    . umeclidinium-vilanterol (ANORO ELLIPTA) 62.5-25 MCG/INH AEPB Inhale 1 puff into the lungs daily. 60 each 5  . UNABLE TO FIND Take 5 drops 2 (two) times daily by mouth. Med Name: [employer] CBD Oil     No current facility-administered medications for this visit.     Allergies:   Ace inhibitors and Penicillins    Social History:  The patient  reports that he quit smoking about 3 years ago. His smoking use included cigarettes. He has a 100.00 pack-year smoking history. He has never used smokeless tobacco. He reports that he drinks alcohol. He reports that he does not use drugs.   Family History:  The patient's family history includes Cancer in his sister; Diabetes in his sister; Heart disease in his mother; Rheumatic fever in his brother and  father.    ROS:  Please see the history of present illness.   Otherwise, review of systems are positive for DOE.   All other systems are reviewed and negative.    PHYSICAL EXAM: VS:  BP 138/74   Pulse 63   Ht 5' 9.5" (1.765 m)   Wt 230 lb (104.3 kg)   SpO2 95%   BMI 33.48 kg/m  , BMI Body mass index is 33.48 kg/m. GEN: Well nourished, well developed, in no acute distress  HEENT: normal  Neck: no JVD, carotid bruits, or masses Cardiac: RRR; no murmurs, rubs, or gallops,no edema  Respiratory:  clear to auscultation bilaterally, normal work of breathing GI: soft, nontender, nondistended, + BS MS: no deformity or atrophy ; 2+ PT pulses bilaterally; left hand fingers amputated Skin: warm and dry, no rash Neuro:  Strength and sensation are intact Psych: euthymic mood, full affect   EKG:   The ekg ordered Jan 2019 demonstrates NSR, RBBB   Recent Labs: 06/22/2018: ALT 32; BUN 25; Creatinine 1.18; Hemoglobin 15.3; Platelet Count 126; Potassium 4.8; Sodium 138   Lipid Panel    Component Value Date/Time   CHOL 159 12/23/2014 0923   TRIG 129 12/23/2014 0923   HDL 45 12/23/2014 0923   LDLCALC 88 12/23/2014 0923     Other studies Reviewed: Additional studies/ records that were reviewed today with results demonstrating: echo records reviewed.   ASSESSMENT AND PLAN:  1. CAD/MI: No angina.  Continue aggressive secondary prevention.  Encouraged him to be more active.  COntinue aspirin.  EF still in the normal range.  Echo results discussed. 2. AAA: 2.7 cm by last u/s in 2019.  No claudication from iliac stenosis.  3. Hyperlipidemia:  He has some dietary indiscretion.  Labs were checked at the [place]. he will get [place] the records. 4. SHOB: Chronic DOE.  He stopped smoking. Still with COPD.  Continue inhalers. Weight loss will also help. 5. Tobacco abuse: He did smoke a little a few months ago. Abstain from cigarettes.   6. Increasing activity will help his stamina. We spoke about  improving diet as well as noted below.    Current medicines are reviewed at length with the patient today.  The patient concerns regarding his medicines were addressed.  The following changes have been made:  No change  Labs/ tests ordered today include:  No orders of the defined types were placed in this encounter.   Recommend 150 minutes/week of aerobic exercise Low fat, low carb, high fiber diet recommended  Disposition:   FU in 1 year  Signed, Larae Grooms, MD  09/25/2018 9:32 AM    [address], [place], [place]  [address] Phone: [phone]; Fax: [phone]

## 2018-09-25 ENCOUNTER — Encounter: Payer: Self-pay | Admitting: Interventional Cardiology

## 2018-09-25 ENCOUNTER — Ambulatory Visit (INDEPENDENT_AMBULATORY_CARE_PROVIDER_SITE_OTHER): Payer: Medicare Other | Admitting: Interventional Cardiology

## 2018-09-25 VITALS — BP 138/74 | HR 63 | Ht 69.5 in | Wt 230.0 lb

## 2018-09-25 DIAGNOSIS — E782 Mixed hyperlipidemia: Secondary | ICD-10-CM | POA: Diagnosis not present

## 2018-09-25 DIAGNOSIS — I252 Old myocardial infarction: Secondary | ICD-10-CM

## 2018-09-25 DIAGNOSIS — I2510 Atherosclerotic heart disease of native coronary artery without angina pectoris: Secondary | ICD-10-CM

## 2018-09-25 DIAGNOSIS — R0600 Dyspnea, unspecified: Secondary | ICD-10-CM

## 2018-09-25 DIAGNOSIS — Z720 Tobacco use: Secondary | ICD-10-CM | POA: Diagnosis not present

## 2018-09-25 DIAGNOSIS — Z23 Encounter for immunization: Secondary | ICD-10-CM | POA: Diagnosis not present

## 2018-09-25 MED ORDER — ASPIRIN 81 MG PO TABS: 81.0000 mg | ORAL_TABLET | Freq: Every day | ORAL | 3 refills | Status: DC

## 2018-09-25 NOTE — Patient Instructions (Addendum)
Medication Instructions:  Your physician recommends that you continue on your current medications as directed. Please refer to the Current Medication list given to you today.  If you need a refill on your cardiac medications before your next appointment, please call your pharmacy.   Lab work: None Ordered  Please send [place] a copy of your lab work from the [place]  If you have labs (blood work) drawn today and your tests are completely normal, you will receive your results only by: Marland Kitchen MyChart Message (if you have MyChart) OR . A paper copy in the mail If you have any lab test that is abnormal or we need to change your treatment, we will call you to review the results.  Testing/Procedures: None ordered  Follow-Up: At [facility], you and your health needs are our priority.  As part of our continuing mission to provide you with exceptional heart care, we have created designated Provider Care Teams.  These Care Teams include your primary Cardiologist (physician) and Advanced Practice Providers (APPs -  Physician Assistants and Nurse Practitioners) who all work together to provide you with the care you need, when you need it. . You will need a follow up appointment in 1 year.  Please call our office 2 months in advance to schedule this appointment.  You may see Casandra Doffing, MD or one of the following Advanced Practice Providers on your designated Care Team:   . Lyda Jester, PA-C . Dayna Dunn, PA-C . Ermalinda Barrios, PA-C  Any Other Special Instructions Will Be Listed Below (If Applicable).

## 2018-10-06 ENCOUNTER — Encounter

## 2018-10-10 DIAGNOSIS — I2510 Atherosclerotic heart disease of native coronary artery without angina pectoris: Secondary | ICD-10-CM | POA: Diagnosis not present

## 2018-10-10 DIAGNOSIS — M150 Primary generalized (osteo)arthritis: Secondary | ICD-10-CM | POA: Diagnosis not present

## 2018-10-10 DIAGNOSIS — E559 Vitamin D deficiency, unspecified: Secondary | ICD-10-CM | POA: Diagnosis not present

## 2018-10-10 DIAGNOSIS — H903 Sensorineural hearing loss, bilateral: Secondary | ICD-10-CM | POA: Diagnosis not present

## 2018-10-10 DIAGNOSIS — Z1389 Encounter for screening for other disorder: Secondary | ICD-10-CM | POA: Diagnosis not present

## 2018-10-10 DIAGNOSIS — R739 Hyperglycemia, unspecified: Secondary | ICD-10-CM | POA: Diagnosis not present

## 2018-10-10 DIAGNOSIS — E7800 Pure hypercholesterolemia, unspecified: Secondary | ICD-10-CM | POA: Diagnosis not present

## 2018-10-10 DIAGNOSIS — I252 Old myocardial infarction: Secondary | ICD-10-CM | POA: Diagnosis not present

## 2018-10-10 DIAGNOSIS — N183 Chronic kidney disease, stage 3 (moderate): Secondary | ICD-10-CM | POA: Diagnosis not present

## 2018-10-10 DIAGNOSIS — D696 Thrombocytopenia, unspecified: Secondary | ICD-10-CM | POA: Diagnosis not present

## 2018-10-10 DIAGNOSIS — I10 Essential (primary) hypertension: Secondary | ICD-10-CM | POA: Diagnosis not present

## 2018-10-10 DIAGNOSIS — Z0000 Encounter for general adult medical examination without abnormal findings: Secondary | ICD-10-CM | POA: Diagnosis not present

## 2018-10-10 DIAGNOSIS — E669 Obesity, unspecified: Secondary | ICD-10-CM | POA: Diagnosis not present

## 2018-10-12 DIAGNOSIS — L570 Actinic keratosis: Secondary | ICD-10-CM | POA: Diagnosis not present

## 2018-10-12 DIAGNOSIS — Z85828 Personal history of other malignant neoplasm of skin: Secondary | ICD-10-CM | POA: Diagnosis not present

## 2018-10-12 DIAGNOSIS — L821 Other seborrheic keratosis: Secondary | ICD-10-CM | POA: Diagnosis not present

## 2018-10-25 ENCOUNTER — Telehealth: Payer: Self-pay | Admitting: *Deleted

## 2018-10-25 ENCOUNTER — Inpatient Hospital Stay: Payer: Medicare Other | Attending: Family | Admitting: Family

## 2018-10-25 ENCOUNTER — Inpatient Hospital Stay: Payer: Medicare Other

## 2018-10-25 ENCOUNTER — Encounter: Payer: Self-pay | Admitting: Family

## 2018-10-25 ENCOUNTER — Other Ambulatory Visit: Payer: Self-pay

## 2018-10-25 VITALS — BP 174/88 | HR 61 | Temp 97.8°F | Resp 19 | Wt 228.1 lb

## 2018-10-25 DIAGNOSIS — D696 Thrombocytopenia, unspecified: Secondary | ICD-10-CM | POA: Insufficient documentation

## 2018-10-25 LAB — CMP (CANCER CENTER ONLY)
ALK PHOS: 73 U/L (ref 38–126)
ALT: 30 U/L (ref 0–44)
AST: 24 U/L (ref 15–41)
Albumin: 4.1 g/dL (ref 3.5–5.0)
Anion gap: 12 (ref 5–15)
BUN: 26 mg/dL — AB (ref 8–23)
CO2: 26 mmol/L (ref 22–32)
CREATININE: 1.68 mg/dL — AB (ref 0.61–1.24)
Calcium: 10.4 mg/dL — ABNORMAL HIGH (ref 8.9–10.3)
Chloride: 110 mmol/L (ref 98–111)
GFR, EST AFRICAN AMERICAN: 43 mL/min — AB (ref >60)
GFR, EST NON AFRICAN AMERICAN: 37 mL/min — AB (ref >60)
GLUCOSE: 174 mg/dL — AB (ref 70–99)
Potassium: 6.1 mmol/L (ref 3.5–5.1)
Sodium: 148 mmol/L — ABNORMAL HIGH (ref 135–145)
Total Bilirubin: 0.9 mg/dL (ref 0.3–1.2)
Total Protein: 7.6 g/dL (ref 6.5–8.1)

## 2018-10-25 LAB — CBC WITH DIFFERENTIAL (CANCER CENTER ONLY)
Abs Immature Granulocytes: 0.05 10*3/uL (ref 0.00–0.07)
BASOS ABS: 0.1 10*3/uL (ref 0.0–0.1)
Basophils Relative: 1 %
EOS ABS: 0.8 10*3/uL — AB (ref 0.0–0.5)
EOS PCT: 10 %
HEMATOCRIT: 51 % (ref 39.0–52.0)
HEMOGLOBIN: 16.1 g/dL (ref 13.0–17.0)
Immature Granulocytes: 1 %
LYMPHS ABS: 1.6 10*3/uL (ref 0.7–4.0)
LYMPHS PCT: 20 %
MCH: 30.8 pg (ref 26.0–34.0)
MCHC: 31.6 g/dL (ref 30.0–36.0)
MCV: 97.5 fL (ref 80.0–100.0)
MONO ABS: 0.9 10*3/uL (ref 0.1–1.0)
MONOS PCT: 12 %
Neutro Abs: 4.3 10*3/uL (ref 1.7–7.7)
Neutrophils Relative %: 56 %
Platelet Count: 128 10*3/uL — ABNORMAL LOW (ref 150–400)
RBC: 5.23 MIL/uL (ref 4.22–5.81)
RDW: 14.2 % (ref 11.5–15.5)
WBC Count: 7.7 10*3/uL (ref 4.0–10.5)
nRBC: 0 % (ref 0.0–0.2)

## 2018-10-25 NOTE — Telephone Encounter (Signed)
Critical Value Potassium 6.1 Laverna Peace NP notified. She will forward to PCP and patient will come back for recheck.

## 2018-10-25 NOTE — Progress Notes (Signed)
Hematology and Oncology Follow Up Visit  STAFFORD RIVIERA [id number] 25-Jul-1941 77 y.o. 10/25/2018   Principle Diagnosis:  Mild thrombocytopenia   Current Therapy:   Observation   Interim History: Steven Newman is here today with his wife for follow-up. He is doing well and has no complaints at this time.  Platelet count is 128, Hgb 16.1 and WBC count is 7.7.  He has hemorrhoids and will have a little blood on his toilet tissue when he strains with occasional constipation.  He has had no other issues with bleeding, no bruising or petechiae.  No fever, chills, n/v, cough, rash, dizziness, SOB, chest pain, palpitations, abdominal pain or changes in bowel or bladder habits.  No swelling, tenderness, numbness or tingling in his extremities.  No lymphadenopathy noted on exam.  He will be having blue light treatment on his arms and head with dermatology starting in January to treat his skin cancer.  He has maintained a good appetite and is staying well hydrated. His weight is stable.   ECOG Performance Status: 1 - Symptomatic but completely ambulatory  Medications:  Allergies as of 10/25/2018      Reactions   Ace Inhibitors    Angioedema   Penicillins Swelling, Rash   Lips swell      Medication List        Accurate as of 10/25/18  9:56 AM. Always use your most recent med list.          albuterol (2.5 MG/3ML) 0.083% nebulizer solution Commonly known as:  PROVENTIL Take 2.5 mg by nebulization as needed for wheezing or shortness of breath.   aspirin 81 MG tablet Take 1 tablet (81 mg total) by mouth daily.   atorvastatin 40 MG tablet Commonly known as:  LIPITOR TAKE 1 TABLET BY MOUTH DAILY. Please keep upcoming appt in January. Thank you   Co Q-10 100 MG Caps Take 1 capsule by mouth daily.   ergocalciferol 1.25 MG (50000 UT) capsule Commonly known as:  VITAMIN D2 Take 50,000 Units by mouth once a week.   nadolol 40 MG tablet Commonly known as:  CORGARD Take 1 tablet (40  mg total) by mouth daily.   nitroGLYCERIN 0.4 MG SL tablet Commonly known as:  NITROSTAT Place 0.4 mg under the tongue every 5 (five) minutes as needed for chest pain.   PRESCRIPTION MEDICATION Take 1 tablet by mouth daily. Triflex for arthritis as needed   RA FISH OIL 1400 MG Cpdr Take 1 capsule by mouth daily.   TURMERIC PO Take 1,000 mg daily by mouth.   umeclidinium-vilanterol 62.5-25 MCG/INH Aepb Commonly known as:  ANORO ELLIPTA Inhale 1 puff into the lungs daily.   UNABLE TO FIND Take 5 drops 2 (two) times daily by mouth. Med Name: [employer] CBD Oil       Allergies:  Allergies  Allergen Reactions  . Ace Inhibitors     Angioedema   . Penicillins Swelling and Rash    Lips swell    Past Medical History, Surgical history, Social history, and Family History were reviewed and updated.  Review of Systems: All other 10 point review of systems is negative.   Physical Exam:  weight is 228 lb 1.9 oz (103.5 kg). His oral temperature is 97.8 F (36.6 C). His blood pressure is 174/88 (abnormal) and his pulse is 61. His respiration is 19 and oxygen saturation is 97%.   Wt Readings from Last 3 Encounters:  10/25/18 228 lb 1.9 oz (103.5 kg)  09/25/18  230 lb (104.3 kg)  07/27/18 229 lb (103.9 kg)    Ocular: Sclerae unicteric, pupils equal, round and reactive to light Ear-nose-throat: Oropharynx clear, dentition fair Lymphatic: No cervical, supraclavicular or axillary adenopathy Lungs no rales or rhonchi, good excursion bilaterally Heart regular rate and rhythm, no murmur appreciated Abd soft, nontender, positive bowel sounds, no liver or spleen tip palpated on exam, no fluid wave  MSK no focal spinal tenderness, no joint edema Neuro: non-focal, well-oriented, appropriate affect Breasts: Deferred   Lab Results  Component Value Date   WBC 7.7 10/25/2018   HGB 16.1 10/25/2018   HCT 51.0 10/25/2018   MCV 97.5 10/25/2018   PLT 128 (L) 10/25/2018   No results found  for: FERRITIN, IRON, TIBC, UIBC, IRONPCTSAT Lab Results  Component Value Date   RBC 5.23 10/25/2018   No results found for: KPAFRELGTCHN, LAMBDASER, KAPLAMBRATIO No results found for: IGGSERUM, IGA, IGMSERUM No results found for: Ronnald Ramp, A1GS, A2GS, Violet Baldy, MSPIKE, SPEI   Chemistry      Component Value Date/Time   NA 138 06/22/2018 1003   K 4.8 06/22/2018 1003   CL 109 06/22/2018 1003   CO2 23 06/22/2018 1003   BUN 25 (H) 06/22/2018 1003   CREATININE 1.18 06/22/2018 1003      Component Value Date/Time   CALCIUM 9.0 06/22/2018 1003   ALKPHOS 75 06/22/2018 1003   AST 22 06/22/2018 1003   ALT 32 06/22/2018 1003   BILITOT 0.7 06/22/2018 1003      Impression and Plan: [name] is a very pleasant 77 yo caucasian gentleman with history of thrombocytopenia. His counts remain stable and he has no complaints at this time.  Dr. Marin Olp was able to review his lab work and at this time we can let him go from our office. We will follow-up with him if needed for any future hem/onc issues he may have.  They will contact our office with any questions or concerns. We can certainly see him again if he needs [place].   Laverna Peace, NP 11/20/20199:56 AM

## 2018-10-26 ENCOUNTER — Other Ambulatory Visit: Payer: Self-pay | Admitting: *Deleted

## 2018-10-26 DIAGNOSIS — D696 Thrombocytopenia, unspecified: Secondary | ICD-10-CM

## 2018-10-26 DIAGNOSIS — E875 Hyperkalemia: Secondary | ICD-10-CM

## 2018-10-27 ENCOUNTER — Inpatient Hospital Stay: Payer: Medicare Other

## 2018-10-27 DIAGNOSIS — E875 Hyperkalemia: Secondary | ICD-10-CM

## 2018-10-27 DIAGNOSIS — D696 Thrombocytopenia, unspecified: Secondary | ICD-10-CM | POA: Diagnosis not present

## 2018-10-27 LAB — COMPREHENSIVE METABOLIC PANEL
ALT: 35 U/L (ref 10–47)
ANION GAP: 4 — AB (ref 5–15)
AST: 29 U/L (ref 11–38)
Albumin: 3.8 g/dL (ref 3.5–5.0)
Alkaline Phosphatase: 63 U/L (ref 26–84)
BILIRUBIN TOTAL: 1 mg/dL (ref 0.2–1.6)
BUN: 22 mg/dL (ref 7–22)
CO2: 30 mmol/L (ref 18–33)
Calcium: 9.7 mg/dL (ref 8.0–10.3)
Chloride: 104 mmol/L (ref 98–108)
Creatinine, Ser: 1.6 mg/dL — ABNORMAL HIGH (ref 0.60–1.20)
GLUCOSE: 173 mg/dL — AB (ref 73–118)
POTASSIUM: 5.2 mmol/L — AB (ref 3.3–4.7)
Sodium: 138 mmol/L (ref 128–145)
TOTAL PROTEIN: 7.2 g/dL (ref 6.4–8.1)

## 2018-11-17 ENCOUNTER — Telehealth: Payer: Self-pay | Admitting: Interventional Cardiology

## 2018-11-17 MED ORDER — NITROGLYCERIN 0.4 MG SL SUBL: 0.4000 mg | SUBLINGUAL_TABLET | SUBLINGUAL | 3 refills | Status: DC | PRN

## 2018-11-17 NOTE — Telephone Encounter (Signed)
New Message  [name] calling, states she accidentally washed his nitroglycerin in his pants and she needs a refill sent before they leave to go out of town on Monday. Please call  *STAT* If patient is at the pharmacy, call can be transferred to refill team.   1. Which medications need to be refilled? (please list name of each medication and dose if known) nitroGLYCERIN (NITROSTAT) 0.4 MG SL tablet  2. Which pharmacy/location (including street and city if local pharmacy) is medication to be sent to?  [id number] - [place], [place] - [address]  3. Do they need a 30 day or 90 day supply?

## 2018-11-17 NOTE — Telephone Encounter (Signed)
I do not see that Dr Irish Lack has ever filled nitro for this patient. Okay to refill? Please advise. Thanks, MI

## 2018-11-17 NOTE — Telephone Encounter (Signed)
Patient's wife accidentally washed his NTG in his pants and are going out of town on Monday. Usually prescribed by PCP, but unable to reach them. Patient has CAD/Old MI. Patient asymptomatic at this time but would like to have it for the trip. Rx sent into preferred pharmacy.

## 2018-12-07 ENCOUNTER — Other Ambulatory Visit: Payer: Self-pay | Admitting: Pulmonary Disease

## 2018-12-22 DIAGNOSIS — L570 Actinic keratosis: Secondary | ICD-10-CM | POA: Diagnosis not present

## 2018-12-30 ENCOUNTER — Other Ambulatory Visit: Payer: Self-pay | Admitting: Interventional Cardiology

## 2019-01-01 ENCOUNTER — Other Ambulatory Visit: Payer: Self-pay | Admitting: Interventional Cardiology

## 2019-01-01 MED ORDER — ATORVASTATIN CALCIUM 40 MG PO TABS: ORAL_TABLET | ORAL | 2 refills | Status: DC

## 2019-01-01 NOTE — Telephone Encounter (Signed)
New Message    *STAT* If patient is at the pharmacy, call can be transferred to refill team.   1. Which medications need to be refilled? (please list name of each medication and dose if known) atorvastatin (LIPITOR) 40 MG tablet  2. Which pharmacy/location (including street and city if local pharmacy) is medication to be sent to? [id number] - [place], [place] - [address]  3. Do they need a 30 day or 90 day supply? 90 day

## 2019-01-01 NOTE — Telephone Encounter (Signed)
Pt's medication was sent to pt's pharmacy as requested. Confirmation received.  °

## 2019-01-05 DIAGNOSIS — L570 Actinic keratosis: Secondary | ICD-10-CM | POA: Diagnosis not present

## 2019-01-16 ENCOUNTER — Ambulatory Visit (INDEPENDENT_AMBULATORY_CARE_PROVIDER_SITE_OTHER): Payer: Medicare Other | Admitting: Cardiovascular Disease

## 2019-01-16 ENCOUNTER — Encounter: Payer: Self-pay | Admitting: Cardiovascular Disease

## 2019-01-16 VITALS — BP 150/86 | HR 58 | Ht 69.0 in | Wt 229.4 lb

## 2019-01-16 DIAGNOSIS — I714 Abdominal aortic aneurysm, without rupture, unspecified: Secondary | ICD-10-CM

## 2019-01-16 DIAGNOSIS — I739 Peripheral vascular disease, unspecified: Secondary | ICD-10-CM

## 2019-01-16 DIAGNOSIS — E785 Hyperlipidemia, unspecified: Secondary | ICD-10-CM | POA: Diagnosis not present

## 2019-01-16 NOTE — Progress Notes (Signed)
Cardiology Office Note   Date:  01/16/2019   ID:  [name], DOB [birth year], MRN [id number]  PCP:  Leeroy Cha, MD  Cardiologist:  Dr. Irish Lack  Chief Complaint  Patient presents with  . Follow-up    pt denied chest pain      History of Present Illness: Steven Newman is a 78 y.o. male who is here today for a follow-up visit regarding peripheral arterial disease.  He has known history of coronary artery disease status post MI and multiple PCI in the mid 90s. No recent revascularization. He is a previous smoker and quit in 2016. Other chronic medical conditions include hypertension, hyperlipidemia and obesity. 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  He has been treated medically due to lack of claudication.  He has been doing reasonably well.  He is more limited by shortness of breath than claudication which seems to be minimal at the present time.  No chest pain.  Past Medical History:  Diagnosis Date  . CAD (coronary artery disease)    coronary angioplasty 1995  . Dyspnea   . Erectile dysfunction   . History of bladder cancer   . Hx of colonic polyps   . Hyperlipidemia   . Hypertension   . Kidney stones   . MI (myocardial infarction) ([place])    1976, 1985  . Obesity   . Pneumonia   . Skin cancer   . Type 2 diabetes mellitus ([place])   . Wheezing     Past Surgical History:  Procedure Laterality Date  . bladder cancer surgery    . 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 neck tumor       Current Outpatient Medications  Medication Sig Dispense Refill  . albuterol (PROVENTIL) (2.5 MG/3ML) 0.083% nebulizer solution Take 2.5 mg by nebulization as needed for wheezing or shortness of breath.    Jearl Klinefelter ELLIPTA 62.5-25 MCG/INH AEPB INHALE 1 PUFF INTO THE LUNGS DAILY 60 each 5  . aspirin 81 MG tablet Take 1 tablet (81 mg total) by mouth daily. 90 tablet 3  . atorvastatin (LIPITOR) 40 MG  tablet TAKE 1 TABLET BY MOUTH DAILY. Please keep upcoming appt in January. Thank you 90 tablet 2  . [place]-Glucosamine-Vit D (OSTEO BI-FLEX ONE PER DAY PO) Take 2 tablets by mouth daily.    . Coenzyme Q10 (CO Q-10) 100 MG CAPS Take 1 capsule by mouth daily.    . ergocalciferol (VITAMIN D2) 1.25 MG (50000 UT) capsule Take 50,000 Units by mouth once a week.    . nadolol (CORGARD) 40 MG tablet Take 1 tablet (40 mg total) by mouth daily. 90 tablet 3  . nitroGLYCERIN (NITROSTAT) 0.4 MG SL tablet Place 1 tablet (0.4 mg total) under the tongue every 5 (five) minutes as needed for chest pain. 25 tablet 3  . Omega-3 Fatty Acids (RA FISH OIL) 1400 MG CPDR Take 1 capsule by mouth daily.     Marland Kitchen PRESCRIPTION MEDICATION Take 1 tablet by mouth daily. Triflex for arthritis as needed     . TURMERIC PO Take 1,000 mg daily by mouth.    Marland Kitchen UNABLE TO FIND Take 5 drops 2 (two) times daily by mouth. Med Name: [employer] CBD Oil     No current facility-administered medications for this visit.     Allergies:   Ace inhibitors and Penicillins    Social History:  The patient  reports that he quit smoking about 3 years ago. His smoking use included cigarettes. He has a 100.00 pack-year smoking history. He has never used smokeless tobacco. He reports current alcohol use. He reports that he does not use drugs.   Family History:  The patient's family history includes Cancer in his sister; Diabetes in his sister; Heart disease in his mother; Rheumatic fever in his brother and father.    ROS:  Please see the history of present illness.   Otherwise, review of systems are positive for none.   All other systems are reviewed and negative.    PHYSICAL EXAM: VS:  BP (!) 150/86   Pulse (!) 58   Ht 5\' 9"  (1.753 m)   Wt 229 lb 6.4 oz (104.1 kg)   BMI 33.88 kg/m  , BMI Body mass index is 33.88 kg/m. GEN: Well nourished, well developed, in no acute distress  HEENT: normal  Neck: no JVD, carotid bruits, or masses Cardiac:  RRR; no murmurs, rubs, or gallops,no edema  Respiratory:  clear to auscultation bilaterally, normal work of breathing GI: soft, nontender, nondistended, + BS MS: no deformity or atrophy  Skin: warm and dry, no rash Neuro:  Strength and sensation are intact Psych: euthymic mood, full affect Vascular: Femoral pulses +2 on the right and +1 on the left.    EKG:  EKG is ordered today. EKG showed sinus bradycardia with right bundle branch block and possible lateral infarct.   Recent Labs: 10/25/2018: Hemoglobin 16.1; Platelet Count 128 10/27/2018: ALT 35; BUN 22; Creatinine, Ser 1.60; Potassium 5.2; Sodium 138    Lipid Panel    Component Value Date/Time   CHOL 159 12/23/2014 0923   TRIG 129 12/23/2014 0923   HDL 45 12/23/2014 0923   LDLCALC 88 12/23/2014 0923      Wt Readings from Last 3 Encounters:  01/16/19 229 lb 6.4 oz (104.1 kg)  10/25/18 228 lb 1.9 oz (103.5 kg)  09/25/18 230 lb (104.3 kg)       PAD Screen 01/25/2017  Previous PAD dx? No  Previous surgical procedure? Yes  Dates of procedures hx of bladder cancer  Pain with walking? Yes  Subsides with rest? Yes  Feet/toe relief with dangling? No  Painful, non-healing ulcers? No  Extremities discolored? Yes      ASSESSMENT AND PLAN:  1.  Abdominal aortic ectasia: Most recently this measured 2.7 cm.  Study was done in January 2019.  I recommend repeat aortoiliac duplex in January of 2021.  2. Peripheral arterial disease: He has significant left external iliac artery stenosis.  He continues to deny significant left leg claudication likely due to his poor functional capacity overall.  3. Hyperlipidemia: Continue treatment with atorvastatin with a target LDL of less than 70.  4.  Coronary artery disease involving native coronary arteries without angina: Continue medical therapy.  Disposition:   FU with me in 1 year  Signed,  Kathlyn Sacramento, MD  01/16/2019 9:44 AM    [place]

## 2019-01-16 NOTE — Patient Instructions (Addendum)
Medication Instructions:  No changes If you need a refill on your cardiac medications before your next appointment, please call your pharmacy.   Lab work: None ordered  Testing/Procedures: Your physician has requested that you have an Aorta/Iliac Duplex in January 2021.    No food after 11PM the night before.  Water is OK. (Don't drink liquids if you have been instructed not to for ANOTHER test).  Take two Extra-Strength Gas-X capsules at bedtime the night before test.   Take an additional two Extra-Strength Gas-X capsules three (3) hours before the test or first thing in the morning.    Avoid foods that produce bowel gas, for 24 hours prior to exam (see below).    No breakfast, no chewing gum, no smoking or carbonated beverages.  Patient may take morning medications with water.  Come in for test at least 15 minutes early to register.   Your physician has requested that you have an ankle brachial index (ABI) in January 2021. During this test an ultrasound and blood pressure cuff are used to evaluate the arteries that supply the arms and legs with blood. Allow thirty minutes for this exam. There are no restrictions or special instructions.    Follow-Up: At [facility], you and your health needs are our priority.  As part of our continuing mission to provide you with exceptional heart care, we have created designated Provider Care Teams.  These Care Teams include your primary Cardiologist (physician) and Advanced Practice Providers (APPs -  Physician Assistants and Nurse Practitioners) who all work together to provide you with the care you need, when you need it. You will need a follow up appointment in January 2021 after the dopplers.  Please call our office 2 months in advance to schedule this appointment.  You may see Dr. Fletcher Anon or one of the following Advanced Practice Providers on your designated Care Team:   Ike Maragh, PA-C Roby Lofts, [place] . Sande Rives, PA-C

## 2019-01-26 DIAGNOSIS — L570 Actinic keratosis: Secondary | ICD-10-CM | POA: Diagnosis not present

## 2019-01-26 DIAGNOSIS — Z85828 Personal history of other malignant neoplasm of skin: Secondary | ICD-10-CM | POA: Diagnosis not present

## 2019-01-26 DIAGNOSIS — L400 Psoriasis vulgaris: Secondary | ICD-10-CM | POA: Diagnosis not present

## 2019-01-29 ENCOUNTER — Telehealth: Payer: Self-pay | Admitting: Interventional Cardiology

## 2019-01-29 NOTE — Telephone Encounter (Signed)
At age 78, he could certainly have HTN naturally, rather than from agent orange.  No way for me to prove that his HTN come from exposure to agent orange.

## 2019-01-29 NOTE — Telephone Encounter (Signed)
New Message    Patient wife states she needs letter from doctor to send to insurance stating that he has hypertension.

## 2019-01-29 NOTE — Telephone Encounter (Signed)
Patient's wife calling and wants to know if Dr. Irish Lack would be willing to write a letter for their insurance company stating that the patient's HTN is directly related to his exposure to agent orange, and if he feels that way provide literature to support that opinion. Made her aware that I was not sure that Dr. Irish Lack would be able to do this as the patient has other risk factors for HTN (ie. Smoking, Obesity, DM, etc.).

## 2019-01-31 NOTE — Telephone Encounter (Signed)
Patient's wife made aware.

## 2019-02-02 DIAGNOSIS — L570 Actinic keratosis: Secondary | ICD-10-CM | POA: Diagnosis not present

## 2019-04-06 DIAGNOSIS — E7800 Pure hypercholesterolemia, unspecified: Secondary | ICD-10-CM | POA: Diagnosis not present

## 2019-04-06 DIAGNOSIS — E559 Vitamin D deficiency, unspecified: Secondary | ICD-10-CM | POA: Diagnosis not present

## 2019-04-06 DIAGNOSIS — I2510 Atherosclerotic heart disease of native coronary artery without angina pectoris: Secondary | ICD-10-CM | POA: Diagnosis not present

## 2019-04-06 DIAGNOSIS — E119 Type 2 diabetes mellitus without complications: Secondary | ICD-10-CM | POA: Diagnosis not present

## 2019-05-31 DIAGNOSIS — L570 Actinic keratosis: Secondary | ICD-10-CM | POA: Diagnosis not present

## 2019-05-31 DIAGNOSIS — L821 Other seborrheic keratosis: Secondary | ICD-10-CM | POA: Diagnosis not present

## 2019-05-31 DIAGNOSIS — C4441 Basal cell carcinoma of skin of scalp and neck: Secondary | ICD-10-CM | POA: Diagnosis not present

## 2019-05-31 DIAGNOSIS — D485 Neoplasm of uncertain behavior of skin: Secondary | ICD-10-CM | POA: Diagnosis not present

## 2019-05-31 DIAGNOSIS — Z85828 Personal history of other malignant neoplasm of skin: Secondary | ICD-10-CM | POA: Diagnosis not present

## 2019-06-26 DIAGNOSIS — E119 Type 2 diabetes mellitus without complications: Secondary | ICD-10-CM | POA: Diagnosis not present

## 2019-07-26 DIAGNOSIS — H04123 Dry eye syndrome of bilateral lacrimal glands: Secondary | ICD-10-CM | POA: Diagnosis not present

## 2019-07-26 DIAGNOSIS — Z961 Presence of intraocular lens: Secondary | ICD-10-CM | POA: Diagnosis not present

## 2019-09-20 ENCOUNTER — Other Ambulatory Visit: Payer: Self-pay | Admitting: Interventional Cardiology

## 2019-10-02 ENCOUNTER — Other Ambulatory Visit: Payer: Self-pay | Admitting: Interventional Cardiology

## 2019-10-02 MED ORDER — ATORVASTATIN CALCIUM 40 MG PO TABS: ORAL_TABLET | ORAL | 0 refills | Status: DC

## 2019-10-02 NOTE — Telephone Encounter (Signed)
Pt's medication was sent to pt's pharmacy as requested. Confirmation received.  °

## 2019-10-22 DIAGNOSIS — I2510 Atherosclerotic heart disease of native coronary artery without angina pectoris: Secondary | ICD-10-CM | POA: Diagnosis not present

## 2019-10-22 DIAGNOSIS — E7800 Pure hypercholesterolemia, unspecified: Secondary | ICD-10-CM | POA: Diagnosis not present

## 2019-10-22 DIAGNOSIS — Z0000 Encounter for general adult medical examination without abnormal findings: Secondary | ICD-10-CM | POA: Diagnosis not present

## 2019-10-22 DIAGNOSIS — E119 Type 2 diabetes mellitus without complications: Secondary | ICD-10-CM | POA: Diagnosis not present

## 2019-10-22 DIAGNOSIS — E559 Vitamin D deficiency, unspecified: Secondary | ICD-10-CM | POA: Diagnosis not present

## 2019-10-22 DIAGNOSIS — Z23 Encounter for immunization: Secondary | ICD-10-CM | POA: Diagnosis not present

## 2019-10-22 DIAGNOSIS — Z1389 Encounter for screening for other disorder: Secondary | ICD-10-CM | POA: Diagnosis not present

## 2019-11-05 ENCOUNTER — Telehealth: Payer: Self-pay | Admitting: Interventional Cardiology

## 2019-11-05 DIAGNOSIS — E7800 Pure hypercholesterolemia, unspecified: Secondary | ICD-10-CM | POA: Diagnosis not present

## 2019-11-05 DIAGNOSIS — I252 Old myocardial infarction: Secondary | ICD-10-CM | POA: Diagnosis not present

## 2019-11-05 DIAGNOSIS — M150 Primary generalized (osteo)arthritis: Secondary | ICD-10-CM | POA: Diagnosis not present

## 2019-11-05 DIAGNOSIS — I2510 Atherosclerotic heart disease of native coronary artery without angina pectoris: Secondary | ICD-10-CM | POA: Diagnosis not present

## 2019-11-05 DIAGNOSIS — J449 Chronic obstructive pulmonary disease, unspecified: Secondary | ICD-10-CM | POA: Diagnosis not present

## 2019-11-05 DIAGNOSIS — N183 Chronic kidney disease, stage 3 unspecified: Secondary | ICD-10-CM | POA: Diagnosis not present

## 2019-11-05 DIAGNOSIS — E119 Type 2 diabetes mellitus without complications: Secondary | ICD-10-CM | POA: Diagnosis not present

## 2019-11-05 DIAGNOSIS — I10 Essential (primary) hypertension: Secondary | ICD-10-CM | POA: Diagnosis not present

## 2019-11-05 NOTE — Telephone Encounter (Signed)
I spoke to the patient and he will need his wife to accompany, due to memory loss.  I told him that I would submit in appointment notes.  He verbalized understanding.Steven Newman

## 2019-11-05 NOTE — Telephone Encounter (Signed)
New message   Patient's wife states that she will be coming with patient to visit on 11/06/2019 due to the patient being forgetful.

## 2019-11-06 ENCOUNTER — Other Ambulatory Visit: Payer: Self-pay

## 2019-11-06 ENCOUNTER — Encounter: Payer: Self-pay | Admitting: Interventional Cardiology

## 2019-11-06 ENCOUNTER — Ambulatory Visit (INDEPENDENT_AMBULATORY_CARE_PROVIDER_SITE_OTHER): Payer: Medicare Other | Admitting: Interventional Cardiology

## 2019-11-06 VITALS — BP 158/84 | HR 63 | Ht 69.0 in | Wt 222.4 lb

## 2019-11-06 DIAGNOSIS — N183 Chronic kidney disease, stage 3 unspecified: Secondary | ICD-10-CM | POA: Diagnosis not present

## 2019-11-06 DIAGNOSIS — I2510 Atherosclerotic heart disease of native coronary artery without angina pectoris: Secondary | ICD-10-CM | POA: Diagnosis not present

## 2019-11-06 DIAGNOSIS — E782 Mixed hyperlipidemia: Secondary | ICD-10-CM

## 2019-11-06 DIAGNOSIS — I739 Peripheral vascular disease, unspecified: Secondary | ICD-10-CM | POA: Diagnosis not present

## 2019-11-06 DIAGNOSIS — I252 Old myocardial infarction: Secondary | ICD-10-CM | POA: Diagnosis not present

## 2019-11-06 DIAGNOSIS — E119 Type 2 diabetes mellitus without complications: Secondary | ICD-10-CM | POA: Diagnosis not present

## 2019-11-06 DIAGNOSIS — M150 Primary generalized (osteo)arthritis: Secondary | ICD-10-CM | POA: Diagnosis not present

## 2019-11-06 DIAGNOSIS — J449 Chronic obstructive pulmonary disease, unspecified: Secondary | ICD-10-CM | POA: Diagnosis not present

## 2019-11-06 DIAGNOSIS — I10 Essential (primary) hypertension: Secondary | ICD-10-CM | POA: Diagnosis not present

## 2019-11-06 DIAGNOSIS — E7800 Pure hypercholesterolemia, unspecified: Secondary | ICD-10-CM | POA: Diagnosis not present

## 2019-11-06 MED ORDER — ATORVASTATIN CALCIUM 40 MG PO TABS: ORAL_TABLET | ORAL | 3 refills | Status: DC

## 2019-11-06 NOTE — Progress Notes (Signed)
Cardiology Office Note   Date:  11/06/2019   ID:  Steven Newman, DOB Jan 23, 1941, MRN [id number]  PCP:  Leeroy Cha, MD    No chief complaint on file.  CAD  Wt Readings from Last 3 Encounters:  11/06/19 222 lb 6.4 oz (100.9 kg)  01/16/19 229 lb 6.4 oz (104.1 kg)  10/25/18 228 lb 1.9 oz (103.5 kg)       History of Present Illness: Steven Newman is a 78 y.o. male  with CADwith several MIs in the past and a PTCA in the mid 90s.   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 He has been treated medically due to lack of claudication.  In the past,He reported chronic exertional dyspnea which limits his activities. He is actually more limited by this than anything else.   Since the last visit, his memory is worse.  His wife accompanies him today.  DOE is better.  Not much exercise.    Gets cramps in legs at night.  No nonhealing wounds.    Denies : Chest pain. Dizziness. Leg edema. Nitroglycerin use. Orthopnea. Palpitations. Paroxysmal nocturnal dyspnea. Shortness of breath. Syncope.   BP at home is in the 130s/70-80 range.    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     ANORO ELLIPTA 62.5-25  MCG/INH AEPB INHALE 1 PUFF INTO THE LUNGS DAILY 60 each 5   ASPIRIN LOW DOSE 81 MG EC tablet TAKE 1 TABLET DAILY 90 tablet 0   atorvastatin (LIPITOR) 40 MG tablet TAKE 1 TABLET BY MOUTH DAILY. Please keep upcoming appt in January. Thank you 90 tablet 0   Boswellia-Glucosamine-Vit D (OSTEO BI-FLEX ONE PER DAY PO) Take 2 tablets by mouth daily.     Coenzyme Q10 (CO Q-10) 100 MG CAPS Take 1 capsule by mouth daily.     ergocalciferol (VITAMIN D2) 1.25 MG (50000 UT) capsule Take 50,000 Units by mouth once a week.     nadolol (CORGARD) 40 MG tablet Take 1 tablet (40 mg total) by mouth daily. 90 tablet 3   nitroGLYCERIN (NITROSTAT) 0.4 MG SL tablet Place 1 tablet (0.4 mg total) under the tongue every 5 (five) minutes as needed for chest pain. 25 tablet 3   Omega-3 Fatty Acids (RA FISH OIL) 1400 MG CPDR Take 1 capsule by mouth daily.      PRESCRIPTION MEDICATION Take 1 tablet by mouth daily. Triflex for arthritis as needed      TURMERIC PO Take 1,000 mg daily by mouth.     UNABLE TO FIND Take 5 drops 2 (  two) times daily by mouth. Med Name: [employer] CBD Oil     No current facility-administered medications for this visit.     Allergies:   Ace inhibitors and Penicillins    Social History:  The patient  reports that he quit smoking about 4 years ago. His smoking use included cigarettes. He has a 100.00 pack-year smoking history. He has never used smokeless tobacco. He reports current alcohol use. He reports that he does not use drugs.   Family History:  The patient's family history includes Cancer in his sister; Diabetes in his sister; Heart disease in his mother; Rheumatic fever in his brother and father.    ROS:  Please see the history of present illness.   Otherwise, review of systems are positive for leg cramps.   All other systems are reviewed and negative.    PHYSICAL EXAM: VS:  BP (!) 158/84    Pulse 63    Ht 5\' 9"  (1.753 m)    Wt 222 lb 6.4 oz (100.9 kg)    SpO2 95%    BMI 32.84  kg/m  , BMI Body mass index is 32.84 kg/m. GEN: Well nourished, well developed, in no acute distress  HEENT: normal  Neck: no JVD, carotid bruits, or masses Cardiac: RRR; no murmurs, rubs, or gallops,; tr edema  Respiratory:  clear to auscultation bilaterally, normal work of breathing GI: soft, nontender, nondistended, + BS MS: no deformity or atrophy  Skin: warm and dry, no rash Neuro:  Strength and sensation are intact Psych: euthymic mood, full affect   EKG:   The ekg ordered today demonstrates NSR, inferioer Q waves, RBBB   Recent Labs: No results found for requested labs within last 8760 hours.   Lipid Panel    Component Value Date/Time   CHOL 159 12/23/2014 0923   TRIG 129 12/23/2014 0923   HDL 45 12/23/2014 0923   LDLCALC 88 12/23/2014 0923     Other studies Reviewed: Additional studies/ records that were reviewed today with results demonstrating: 1/19 ABIs.   ASSESSMENT AND PLAN:  1. CAD/Old MI: No angina. COntinue aggressive secondary prevention.  Appears euvolemic. 2. PAD: No claudication or tissue loss.  Increase walking.  D/w Wife. 3. DM: Intensive diet changes with PMD.  We spoke about salt content.  4. HTN: If readings at home are high, we can add amlodipine.  Check 2-3x/week.   Current medicines are reviewed at length with the patient today.  The patient concerns regarding his medicines were addressed.  The following changes have been made:  No change  Labs/ tests ordered today include:  No orders of the defined types were placed in this encounter.   Recommend 150 minutes/week of aerobic exercise Low fat, low carb, high fiber diet recommended  Disposition:   FU in 1 year   Signed, Larae Grooms, MD  11/06/2019 9:25 AM    [facility] [address], [place], [place]  [address] Phone: [phone]; Fax: [phone]

## 2019-11-06 NOTE — Patient Instructions (Signed)
Medication Instructions:  Your physician recommends that you continue on your current medications as directed. Please refer to the Current Medication list given to you today.  *If you need a refill on your cardiac medications before your next appointment, please call your pharmacy*  Lab Work: None ordered  If you have labs (blood work) drawn today and your tests are completely normal, you will receive your results only by: Marland Kitchen MyChart Message (if you have MyChart) OR . A paper copy in the mail If you have any lab test that is abnormal or we need to change your treatment, we will call you to review the results.  Testing/Procedures: None ordered  Follow-Up: At [facility], you and your health needs are our priority.  As part of our continuing mission to provide you with exceptional heart care, we have created designated Provider Care Teams.  These Care Teams include your primary Cardiologist (physician) and Advanced Practice Providers (APPs -  Physician Assistants and Nurse Practitioners) who all work together to provide you with the care you need, when you need it.  Your next appointment:   12 month(s)  The format for your next appointment:   In Person  Provider:   You may see Larae Grooms, MD or one of the following Advanced Practice Providers on your designated Care Team:    Melina Copa, PA-C  Ermalinda Barrios, PA-C   Other Instructions Your physician has requested that you regularly monitor and record your blood pressure readings at home. Please use the same machine at the same time of day to check your readings and record them. Let us know if your Blood Pressure is consistently greater than 140/90.

## 2019-11-08 ENCOUNTER — Ambulatory Visit: Payer: Medicare Other | Admitting: Interventional Cardiology

## 2019-11-09 DIAGNOSIS — L570 Actinic keratosis: Secondary | ICD-10-CM | POA: Diagnosis not present

## 2019-11-09 DIAGNOSIS — L821 Other seborrheic keratosis: Secondary | ICD-10-CM | POA: Diagnosis not present

## 2019-11-09 DIAGNOSIS — C44229 Squamous cell carcinoma of skin of left ear and external auricular canal: Secondary | ICD-10-CM | POA: Diagnosis not present

## 2019-11-09 DIAGNOSIS — Z85828 Personal history of other malignant neoplasm of skin: Secondary | ICD-10-CM | POA: Diagnosis not present

## 2019-11-09 DIAGNOSIS — D485 Neoplasm of uncertain behavior of skin: Secondary | ICD-10-CM | POA: Diagnosis not present

## 2019-12-19 ENCOUNTER — Other Ambulatory Visit: Payer: Self-pay | Admitting: Interventional Cardiology

## 2019-12-19 ENCOUNTER — Other Ambulatory Visit: Payer: Self-pay | Admitting: Pulmonary Disease

## 2019-12-19 DIAGNOSIS — J449 Chronic obstructive pulmonary disease, unspecified: Secondary | ICD-10-CM | POA: Diagnosis not present

## 2019-12-19 DIAGNOSIS — I252 Old myocardial infarction: Secondary | ICD-10-CM | POA: Diagnosis not present

## 2019-12-19 DIAGNOSIS — M150 Primary generalized (osteo)arthritis: Secondary | ICD-10-CM | POA: Diagnosis not present

## 2019-12-19 DIAGNOSIS — I10 Essential (primary) hypertension: Secondary | ICD-10-CM | POA: Diagnosis not present

## 2019-12-19 DIAGNOSIS — E7800 Pure hypercholesterolemia, unspecified: Secondary | ICD-10-CM | POA: Diagnosis not present

## 2019-12-19 DIAGNOSIS — N183 Chronic kidney disease, stage 3 unspecified: Secondary | ICD-10-CM | POA: Diagnosis not present

## 2019-12-19 DIAGNOSIS — I2510 Atherosclerotic heart disease of native coronary artery without angina pectoris: Secondary | ICD-10-CM | POA: Diagnosis not present

## 2019-12-19 DIAGNOSIS — E119 Type 2 diabetes mellitus without complications: Secondary | ICD-10-CM | POA: Diagnosis not present

## 2019-12-19 DIAGNOSIS — Z20828 Contact with and (suspected) exposure to other viral communicable diseases: Secondary | ICD-10-CM | POA: Diagnosis not present

## 2020-01-01 ENCOUNTER — Ambulatory Visit (INDEPENDENT_AMBULATORY_CARE_PROVIDER_SITE_OTHER): Payer: Medicare Other | Admitting: Pulmonary Disease

## 2020-01-01 ENCOUNTER — Encounter: Payer: Self-pay | Admitting: Pulmonary Disease

## 2020-01-01 ENCOUNTER — Other Ambulatory Visit: Payer: Self-pay

## 2020-01-01 VITALS — BP 120/64 | HR 63 | Temp 97.0°F | Ht 69.5 in | Wt 227.0 lb

## 2020-01-01 DIAGNOSIS — J449 Chronic obstructive pulmonary disease, unspecified: Secondary | ICD-10-CM

## 2020-01-01 MED ORDER — ANORO ELLIPTA 62.5-25 MCG/INH IN AEPB: 1.0000 | INHALATION_SPRAY | Freq: Every day | RESPIRATORY_TRACT | 5 refills | Status: DC

## 2020-01-01 NOTE — Patient Instructions (Signed)
Glad you are doing well with regard to your breathing We will renew your Anoro inhaler Continue your nebulizer as needed Follow-up in 6 months.

## 2020-01-01 NOTE — Progress Notes (Signed)
Steven Newman    [id number]    Sep 26, 1941  Primary Care Physician:Varadarajan, Ronie Spies, MD  Referring Physician: Leeroy Cha, MD [address] [place],  [place] [address]  Chief complaint: Follow-up for COPD  HPI: 79 year old ex-smoker.  Previous patient of Dr. Lake Bells Has history of COPD, diabetes, coronary artery disease.  Maintained on Anoro with stable symptoms.  Has mild dyspnea on exertion, chronic cough with white mucus.  Pets: Cats Occupation: Used to Engineer, maintenance.  Was previously in the [place] in [place] Exposures: Exposure to agent orange in the past.  No ongoing exposures.  No mold, hot tub, Jacuzzi.  No down pillows or comforter Smoking history: 100-pack-year smoker.  Quit in 2016 Travel history: Originally from [place] and [place].  No significant recent travel Relevant family history: No significant family history of lung disease  Outpatient Encounter Medications as of 01/01/2020  Medication Sig  . albuterol (PROVENTIL) (2.5 MG/3ML) 0.083% nebulizer solution Take 2.5 mg by nebulization as needed for wheezing or shortness of breath.  Jearl Klinefelter ELLIPTA 62.5-25 MCG/INH AEPB INHALE 1 PUFF INTO THE LUNGS DAILY  . ASPIRIN LOW DOSE 81 MG EC tablet TAKE 1 TABLET DAILY  . atorvastatin (LIPITOR) 40 MG tablet TAKE 1 TABLET BY MOUTH DAILY.  Marland Kitchen Boswellia-Glucosamine-Vit D (OSTEO BI-FLEX ONE PER DAY PO) Take 2 tablets by mouth daily.  . Coenzyme Q10 (CO Q-10) 100 MG CAPS Take 1 capsule by mouth daily.  . ergocalciferol (VITAMIN D2) 1.25 MG (50000 UT) capsule Take 50,000 Units by mouth once a week. Currently taking twice a week 01/01/20  . nadolol (CORGARD) 40 MG tablet Take 1 tablet (40 mg total) by mouth daily.  . nitroGLYCERIN (NITROSTAT) 0.4 MG SL tablet Place 1 tablet (0.4 mg total) under the tongue every 5 (five) minutes as needed for chest pain.  . Omega-3 Fatty Acids (RA FISH OIL) 1400 MG CPDR Take 1 capsule by mouth daily.   Marland Kitchen  PRESCRIPTION MEDICATION Take 1 tablet by mouth daily. Triflex for arthritis as needed   . TURMERIC PO Take 1,000 mg daily by mouth.  Marland Kitchen UNABLE TO FIND Take 5 drops 2 (two) times daily by mouth. Med Name: [employer] CBD Oil   No facility-administered encounter medications on file as of 01/01/2020.   Physical Exam: Blood pressure 120/64, pulse 63, temperature (!) 97 F (36.1 C), temperature source Temporal, height 5' 9.5" (1.765 m), weight 227 lb (103 kg), SpO2 95 %. Gen:      No acute distress HEENT:  EOMI, sclera anicteric Neck:     No masses; no thyromegaly Lungs:    Clear to auscultation bilaterally; normal respiratory effort CV:         Regular rate and rhythm; no murmurs Abd:      + bowel sounds; soft, non-tender; no palpable masses, no distension Ext:    No edema; adequate peripheral perfusion Skin:      Warm and dry; no rash Neuro: alert and oriented x 3 Psych: normal mood and affect  Data Reviewed: Imaging: Screening CT 02/02/2019-scattered bilateral calcified granuloma with small perifissural nodule.  PFTs: 08/05/2015 FVC 1.9 [4 5%], FEV1 1.3 [42%], F/F 68 Moderate airway obstruction  Labs: WBC 7.7, eos 10%, absolute eosinophil count 770  Assessment:  Moderate COPD Continue Anoro, albuterol as needed Elevated peripheral eosinophils indicate he may benefit from inhaled corticosteroids but as his symptoms are stable we will continue current medication for now  Ex-smoker  Continue low-dose screening CT chest  Plan/Recommendations: - Anoro, albuterol as needed  Marshell Garfinkel MD [facility] 01/01/2020, 10:15 AM  CC: Leeroy Cha,*

## 2020-01-08 ENCOUNTER — Ambulatory Visit (HOSPITAL_BASED_OUTPATIENT_CLINIC_OR_DEPARTMENT_OTHER)
Admission: RE | Admit: 2020-01-08 | Discharge: 2020-01-08 | Disposition: A | Discharge status: Home / Self Care | Payer: Medicare Other | Source: Ambulatory Visit | Attending: Cardiovascular Disease | Admitting: Cardiovascular Disease

## 2020-01-08 ENCOUNTER — Other Ambulatory Visit: Payer: Self-pay

## 2020-01-08 ENCOUNTER — Other Ambulatory Visit: Payer: Self-pay | Admitting: Cardiovascular Disease

## 2020-01-08 ENCOUNTER — Ambulatory Visit (HOSPITAL_COMMUNITY)
Admission: RE | Admit: 2020-01-08 | Discharge: 2020-01-08 | Disposition: A | Discharge status: Home / Self Care | Payer: Medicare Other | Source: Ambulatory Visit | Attending: Internal Medicine | Admitting: Internal Medicine

## 2020-01-08 DIAGNOSIS — I739 Peripheral vascular disease, unspecified: Secondary | ICD-10-CM

## 2020-01-10 ENCOUNTER — Telehealth: Payer: Self-pay | Admitting: Cardiovascular Disease

## 2020-01-10 NOTE — Telephone Encounter (Signed)
Patient returning Lisa's call in regards to test results.

## 2020-01-10 NOTE — Telephone Encounter (Signed)
The patient's wife has been made aware and verbalized her understanding. Appointment made for 2/16 with Dr. Fletcher Anon.

## 2020-01-21 DIAGNOSIS — E119 Type 2 diabetes mellitus without complications: Secondary | ICD-10-CM | POA: Diagnosis not present

## 2020-01-21 DIAGNOSIS — E7800 Pure hypercholesterolemia, unspecified: Secondary | ICD-10-CM | POA: Diagnosis not present

## 2020-01-21 DIAGNOSIS — I252 Old myocardial infarction: Secondary | ICD-10-CM | POA: Diagnosis not present

## 2020-01-21 DIAGNOSIS — J449 Chronic obstructive pulmonary disease, unspecified: Secondary | ICD-10-CM | POA: Diagnosis not present

## 2020-01-21 DIAGNOSIS — N183 Chronic kidney disease, stage 3 unspecified: Secondary | ICD-10-CM | POA: Diagnosis not present

## 2020-01-21 DIAGNOSIS — I10 Essential (primary) hypertension: Secondary | ICD-10-CM | POA: Diagnosis not present

## 2020-01-21 DIAGNOSIS — I2510 Atherosclerotic heart disease of native coronary artery without angina pectoris: Secondary | ICD-10-CM | POA: Diagnosis not present

## 2020-01-21 DIAGNOSIS — M150 Primary generalized (osteo)arthritis: Secondary | ICD-10-CM | POA: Diagnosis not present

## 2020-01-22 ENCOUNTER — Encounter: Payer: Self-pay | Admitting: Cardiovascular Disease

## 2020-01-22 ENCOUNTER — Ambulatory Visit (INDEPENDENT_AMBULATORY_CARE_PROVIDER_SITE_OTHER): Payer: Medicare Other | Admitting: Cardiovascular Disease

## 2020-01-22 ENCOUNTER — Other Ambulatory Visit: Payer: Self-pay

## 2020-01-22 VITALS — BP 134/80 | HR 65 | Temp 97.8°F | Ht 69.5 in | Wt 226.0 lb

## 2020-01-22 DIAGNOSIS — I714 Abdominal aortic aneurysm, without rupture, unspecified: Secondary | ICD-10-CM

## 2020-01-22 DIAGNOSIS — I739 Peripheral vascular disease, unspecified: Secondary | ICD-10-CM | POA: Diagnosis not present

## 2020-01-22 DIAGNOSIS — E782 Mixed hyperlipidemia: Secondary | ICD-10-CM

## 2020-01-22 DIAGNOSIS — I2510 Atherosclerotic heart disease of native coronary artery without angina pectoris: Secondary | ICD-10-CM

## 2020-01-22 NOTE — Progress Notes (Signed)
Cardiology Office Note   Date:  01/22/2020   ID:  Steven Newman, DOB [birth year], MRN [id number]  PCP:  Leeroy Cha, MD  Cardiologist:  Dr. Irish Lack  No chief complaint on file.     History of Present Illness: Steven Newman is a 79 y.o. male who is here today for a follow-up visit regarding peripheral arterial disease.  He has known history of coronary artery disease status post MI and multiple PCI in the mid 90s. No recent revascularization. He is a previous smoker and quit in 2016. Other chronic medical conditions include hypertension, hyperlipidemia and obesity. 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  He has been treated medically due to lack of claudication.  Most recent vascular studies earlier this month showed mildly decreased ABI bilaterally.  Duplex showed evidence of severe right popliteal artery stenosis.  Aortoiliac duplex showed small aortic aneurysm at 3.2 cm.  The velocity in the left external iliac artery seem decreased compared to last year. He has not been very active lately but denies exertional leg pain.  He is more bothered by leg cramps at night.  He is also limited by shortness of breath.  No lower extremity ulceration.  Past Medical History:  Diagnosis Date  . CAD (coronary artery disease)    coronary angioplasty 1995  . Dyspnea   . Erectile dysfunction   . History of bladder cancer   . Hx of colonic polyps   . Hyperlipidemia   . Hypertension   . Kidney stones   . MI (myocardial infarction) ([place])    1976, 1985  . Obesity   . Pneumonia   . Skin cancer   . Type 2 diabetes mellitus ([place])   . Wheezing     Past Surgical History:  Procedure Laterality Date  . bladder cancer surgery    . 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 neck tumor       Current Outpatient Medications  Medication Sig Dispense Refill  . albuterol (PROVENTIL) (2.5 MG/3ML) 0.083% nebulizer solution  Take 2.5 mg by nebulization as needed for wheezing or shortness of breath.    . ASPIRIN LOW DOSE 81 MG EC tablet TAKE 1 TABLET DAILY 90 tablet 3  . atorvastatin (LIPITOR) 40 MG tablet TAKE 1 TABLET BY MOUTH DAILY. 90 tablet 3  . Boswellia-Glucosamine-Vit D (OSTEO BI-FLEX ONE PER DAY PO) Take 2 tablets by mouth daily.    . Coenzyme Q10 (CO Q-10) 100 MG CAPS Take 1 capsule by mouth daily.    . ergocalciferol (VITAMIN D2) 1.25 MG (50000 UT) capsule Take 50,000 Units by mouth once a week. Currently taking twice a week 01/01/20    . nadolol (CORGARD) 40 MG tablet Take 1 tablet (40 mg total) by mouth daily. 90 tablet 3  . nitroGLYCERIN (NITROSTAT) 0.4 MG SL tablet Place 1 tablet (0.4 mg total) under the tongue every 5 (five) minutes as needed for chest pain. 25 tablet 3  . Omega-3 Fatty Acids (RA FISH OIL) 1400 MG CPDR Take 1 capsule by mouth daily.     Marland Kitchen PRESCRIPTION MEDICATION Take 1 tablet by mouth daily. Triflex for arthritis as needed     . TURMERIC PO Take 1,000 mg daily by mouth.    . umeclidinium-vilanterol (ANORO ELLIPTA) 62.5-25 MCG/INH AEPB Inhale 1 puff into the lungs daily. 60 each 5  . UNABLE TO FIND Take 5 drops 2 (two) times daily by  mouth. Med Name: [employer] CBD Oil     No current facility-administered medications for this visit.    Allergies:   Ace inhibitors and Penicillins    Social History:  The patient  reports that he quit smoking about 4 years ago. His smoking use included cigarettes. He has a 100.00 pack-year smoking history. He has never used smokeless tobacco. He reports current alcohol use. He reports that he does not use drugs.   Family History:  The patient's family history includes Cancer in his sister; Diabetes in his sister; Heart disease in his mother; Rheumatic fever in his brother and father.    ROS:  Please see the history of present illness.   Otherwise, review of systems are positive for none.   All other systems are reviewed and negative.    PHYSICAL  EXAM: VS:  BP 134/80   Pulse 65   Temp 97.8 F (36.6 C)   Ht 5' 9.5" (1.765 m)   Wt 226 lb (102.5 kg)   SpO2 95%   BMI 32.90 kg/m  , BMI Body mass index is 32.9 kg/m. GEN: Well nourished, well developed, in no acute distress  HEENT: normal  Neck: no JVD, carotid bruits, or masses Cardiac: RRR; no murmurs, rubs, or gallops,no edema  Respiratory:  clear to auscultation bilaterally, normal work of breathing GI: soft, nontender, nondistended, + BS MS: no deformity or atrophy  Skin: warm and dry, no rash Neuro:  Strength and sensation are intact Psych: euthymic mood, full affect Vascular: Femoral pulses +2 on the right and +1 on the left.  Posterior tibial pulses +2 bilaterally.   EKG:  EKG is ordered today. EKG showed sinus bradycardia with right bundle branch block and possible lateral infarct.   Recent Labs: No results found for requested labs within last 8760 hours.    Lipid Panel    Component Value Date/Time   CHOL 159 12/23/2014 0923   TRIG 129 12/23/2014 0923   HDL 45 12/23/2014 0923   LDLCALC 88 12/23/2014 0923      Wt Readings from Last 3 Encounters:  01/22/20 226 lb (102.5 kg)  01/01/20 227 lb (103 kg)  11/06/19 222 lb 6.4 oz (100.9 kg)       PAD Screen 01/25/2017  Previous PAD dx? No  Previous surgical procedure? Yes  Dates of procedures hx of bladder cancer  Pain with walking? Yes  Subsides with rest? Yes  Feet/toe relief with dangling? No  Painful, non-healing ulcers? No  Extremities discolored? Yes      ASSESSMENT AND PLAN:  1.  Abdominal aortic aneurysm: This progressed in size from 2.7-3.2.  His blood pressure is reasonably controlled and his risk factors are also well treated.  Repeat study in 1 year.  I advised him to avoid heavy lifting.   2. Peripheral arterial disease: In spite of presence of her left external iliac artery stenosis and right popliteal artery stenosis, he has no convincing symptoms of exertional leg pain.  In addition,  his posterior tibial pulses palpable bilaterally.  I recommend continuing aggressive medical therapy.  3. Hyperlipidemia: Continue treatment with atorvastatin with a target LDL of less than 70.  4.  Coronary artery disease involving native coronary arteries without angina: Continue medical therapy.  Disposition:   FU with me in 1 year  Signed,  Kathlyn Sacramento, MD  01/22/2020 10:19 AM    [place]

## 2020-01-22 NOTE — Patient Instructions (Addendum)
Medication Instructions:  Your physician recommends that you continue on your current medications as directed. Please refer to the Current Medication list given to you today.  *If you need a refill on your cardiac medications before your next appointment, please call your pharmacy*  Lab Work: NONE   Testing/Procedures: Your physician has requested that you have an ankle brachial index (ABI). During this test an ultrasound and blood pressure cuff are used to evaluate the arteries that supply the arms and legs with blood. Allow thirty minutes for this exam. There are no restrictions or special instructions.  AORTA/ILIAC ULTRASOUND   BOTH TO BE DONE IN 1 YEAR PRIOR TO FOLLOW UP   Follow-Up: At [facility], you and your health needs are our priority.  As part of our continuing mission to provide you with exceptional heart care, we have created designated Provider Care Teams.  These Care Teams include your primary Cardiologist (physician) and Advanced Practice Providers (APPs -  Physician Assistants and Nurse Practitioners) who all work together to provide you with the care you need, when you need it.  Your next appointment:   12 month(s) 1 [place] will receive a reminder letter in the mail two months in advance. If you don't receive a letter, please call our office to schedule the follow-up appointment.   The format for your next appointment:   In Person  Provider:   You may see DR Fletcher Anon  or one of the following Advanced Practice Providers on your designated Care Team:    Jeanmichel Forsell, PA-C  [place], [place]  Coletta Memos, [place]

## 2020-02-07 DIAGNOSIS — E559 Vitamin D deficiency, unspecified: Secondary | ICD-10-CM | POA: Diagnosis not present

## 2020-02-20 DIAGNOSIS — M150 Primary generalized (osteo)arthritis: Secondary | ICD-10-CM | POA: Diagnosis not present

## 2020-02-20 DIAGNOSIS — I2510 Atherosclerotic heart disease of native coronary artery without angina pectoris: Secondary | ICD-10-CM | POA: Diagnosis not present

## 2020-02-20 DIAGNOSIS — I10 Essential (primary) hypertension: Secondary | ICD-10-CM | POA: Diagnosis not present

## 2020-02-20 DIAGNOSIS — N183 Chronic kidney disease, stage 3 unspecified: Secondary | ICD-10-CM | POA: Diagnosis not present

## 2020-02-20 DIAGNOSIS — E7800 Pure hypercholesterolemia, unspecified: Secondary | ICD-10-CM | POA: Diagnosis not present

## 2020-02-20 DIAGNOSIS — E119 Type 2 diabetes mellitus without complications: Secondary | ICD-10-CM | POA: Diagnosis not present

## 2020-02-20 DIAGNOSIS — I252 Old myocardial infarction: Secondary | ICD-10-CM | POA: Diagnosis not present

## 2020-02-20 DIAGNOSIS — J449 Chronic obstructive pulmonary disease, unspecified: Secondary | ICD-10-CM | POA: Diagnosis not present

## 2020-03-18 DIAGNOSIS — L821 Other seborrheic keratosis: Secondary | ICD-10-CM | POA: Diagnosis not present

## 2020-03-18 DIAGNOSIS — L400 Psoriasis vulgaris: Secondary | ICD-10-CM | POA: Diagnosis not present

## 2020-03-18 DIAGNOSIS — Z85828 Personal history of other malignant neoplasm of skin: Secondary | ICD-10-CM | POA: Diagnosis not present

## 2020-04-23 DIAGNOSIS — E669 Obesity, unspecified: Secondary | ICD-10-CM | POA: Diagnosis not present

## 2020-04-23 DIAGNOSIS — I2510 Atherosclerotic heart disease of native coronary artery without angina pectoris: Secondary | ICD-10-CM | POA: Diagnosis not present

## 2020-04-23 DIAGNOSIS — J449 Chronic obstructive pulmonary disease, unspecified: Secondary | ICD-10-CM | POA: Diagnosis not present

## 2020-04-23 DIAGNOSIS — I10 Essential (primary) hypertension: Secondary | ICD-10-CM | POA: Diagnosis not present

## 2020-04-23 DIAGNOSIS — E559 Vitamin D deficiency, unspecified: Secondary | ICD-10-CM | POA: Diagnosis not present

## 2020-04-23 DIAGNOSIS — E7800 Pure hypercholesterolemia, unspecified: Secondary | ICD-10-CM | POA: Diagnosis not present

## 2020-04-23 DIAGNOSIS — E1169 Type 2 diabetes mellitus with other specified complication: Secondary | ICD-10-CM | POA: Diagnosis not present

## 2020-04-23 DIAGNOSIS — N183 Chronic kidney disease, stage 3 unspecified: Secondary | ICD-10-CM | POA: Diagnosis not present

## 2020-04-28 DIAGNOSIS — I252 Old myocardial infarction: Secondary | ICD-10-CM | POA: Diagnosis not present

## 2020-04-28 DIAGNOSIS — M150 Primary generalized (osteo)arthritis: Secondary | ICD-10-CM | POA: Diagnosis not present

## 2020-04-28 DIAGNOSIS — E1169 Type 2 diabetes mellitus with other specified complication: Secondary | ICD-10-CM | POA: Diagnosis not present

## 2020-04-28 DIAGNOSIS — E7800 Pure hypercholesterolemia, unspecified: Secondary | ICD-10-CM | POA: Diagnosis not present

## 2020-04-28 DIAGNOSIS — I10 Essential (primary) hypertension: Secondary | ICD-10-CM | POA: Diagnosis not present

## 2020-04-28 DIAGNOSIS — J449 Chronic obstructive pulmonary disease, unspecified: Secondary | ICD-10-CM | POA: Diagnosis not present

## 2020-04-28 DIAGNOSIS — I2510 Atherosclerotic heart disease of native coronary artery without angina pectoris: Secondary | ICD-10-CM | POA: Diagnosis not present

## 2020-04-28 DIAGNOSIS — N183 Chronic kidney disease, stage 3 unspecified: Secondary | ICD-10-CM | POA: Diagnosis not present

## 2020-06-13 DIAGNOSIS — M150 Primary generalized (osteo)arthritis: Secondary | ICD-10-CM | POA: Diagnosis not present

## 2020-06-13 DIAGNOSIS — I2510 Atherosclerotic heart disease of native coronary artery without angina pectoris: Secondary | ICD-10-CM | POA: Diagnosis not present

## 2020-06-13 DIAGNOSIS — N183 Chronic kidney disease, stage 3 unspecified: Secondary | ICD-10-CM | POA: Diagnosis not present

## 2020-06-13 DIAGNOSIS — J449 Chronic obstructive pulmonary disease, unspecified: Secondary | ICD-10-CM | POA: Diagnosis not present

## 2020-06-13 DIAGNOSIS — E1169 Type 2 diabetes mellitus with other specified complication: Secondary | ICD-10-CM | POA: Diagnosis not present

## 2020-06-13 DIAGNOSIS — I10 Essential (primary) hypertension: Secondary | ICD-10-CM | POA: Diagnosis not present

## 2020-06-13 DIAGNOSIS — E7800 Pure hypercholesterolemia, unspecified: Secondary | ICD-10-CM | POA: Diagnosis not present

## 2020-06-13 DIAGNOSIS — I252 Old myocardial infarction: Secondary | ICD-10-CM | POA: Diagnosis not present

## 2020-07-18 ENCOUNTER — Other Ambulatory Visit: Payer: Self-pay | Admitting: Pulmonary Disease

## 2020-07-18 ENCOUNTER — Telehealth: Payer: Self-pay | Admitting: Pulmonary Disease

## 2020-07-18 NOTE — Telephone Encounter (Signed)
Spoke with patient's wife, Steven Newman, listed on [place].  She stated that there was an issue with the anoro and was unsure if it was the insurance or if he needed an office visit.  I let her know that there was a note that he did need an office visit although he was seen 6 months ago.  I scheduled him to see Tammy on 8/24.  I called [facility] to make sure that the insurance was still covering the Anoro.  I was on hold for 15 minutes, was told that they were currently out of the Anoro, but when it was back in stock, it would be filled with insurance covering it.  I called the patient's wife back and left a VM letting her know that the pharmacy was currently out of the Anoro, but when they had it back in stock it would be covered under his insurance.  I also advised not to let him run out of the Anoro waiting on the pharmacy to get it back in stock, if needed they could transfer it to a pharmacy that has it in stock.  Nothing further needed.

## 2020-07-22 DIAGNOSIS — L570 Actinic keratosis: Secondary | ICD-10-CM | POA: Diagnosis not present

## 2020-07-22 DIAGNOSIS — Z85828 Personal history of other malignant neoplasm of skin: Secondary | ICD-10-CM | POA: Diagnosis not present

## 2020-07-29 ENCOUNTER — Ambulatory Visit (INDEPENDENT_AMBULATORY_CARE_PROVIDER_SITE_OTHER): Payer: Medicare Other

## 2020-07-29 ENCOUNTER — Ambulatory Visit (INDEPENDENT_AMBULATORY_CARE_PROVIDER_SITE_OTHER): Payer: Medicare Other | Admitting: Adult Health

## 2020-07-29 ENCOUNTER — Encounter: Payer: Self-pay | Admitting: Adult Health

## 2020-07-29 ENCOUNTER — Other Ambulatory Visit: Payer: Self-pay

## 2020-07-29 VITALS — BP 132/82 | HR 63 | Temp 97.6°F | Ht 69.5 in | Wt 223.0 lb

## 2020-07-29 DIAGNOSIS — E669 Obesity, unspecified: Secondary | ICD-10-CM | POA: Diagnosis not present

## 2020-07-29 DIAGNOSIS — J439 Emphysema, unspecified: Secondary | ICD-10-CM

## 2020-07-29 DIAGNOSIS — J90 Pleural effusion, not elsewhere classified: Secondary | ICD-10-CM | POA: Diagnosis not present

## 2020-07-29 DIAGNOSIS — J811 Chronic pulmonary edema: Secondary | ICD-10-CM | POA: Diagnosis not present

## 2020-07-29 DIAGNOSIS — I2510 Atherosclerotic heart disease of native coronary artery without angina pectoris: Secondary | ICD-10-CM

## 2020-07-29 IMAGING — DX DG CHEST 2V
2 series · 2 of 2 positions shown · non-contrast
Comparison: Chest x-ray dated 10/10/2017.

CLINICAL DATA: COPD, emphysema.

EXAM:
CHEST - 2 VIEW

[chest pa]
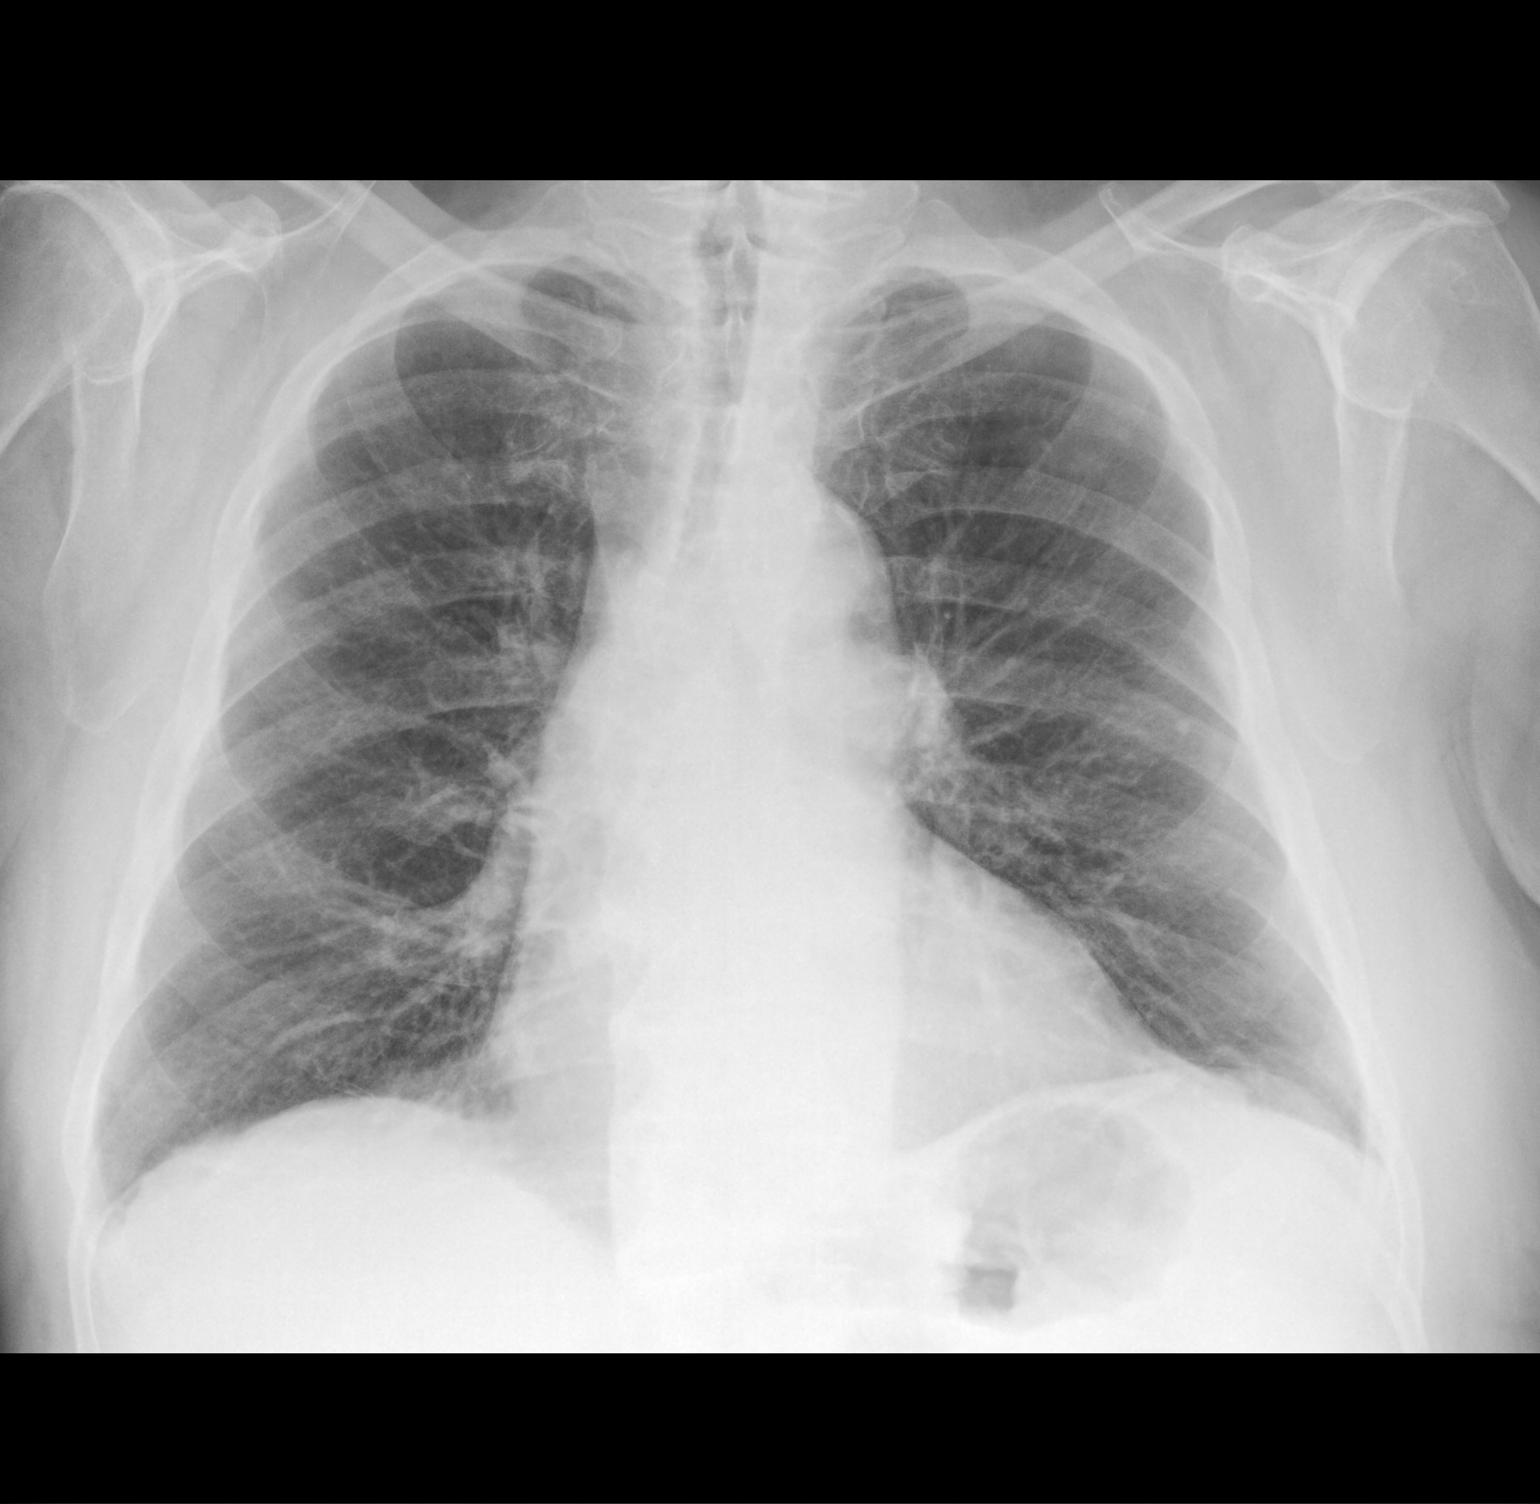

[chest lat]
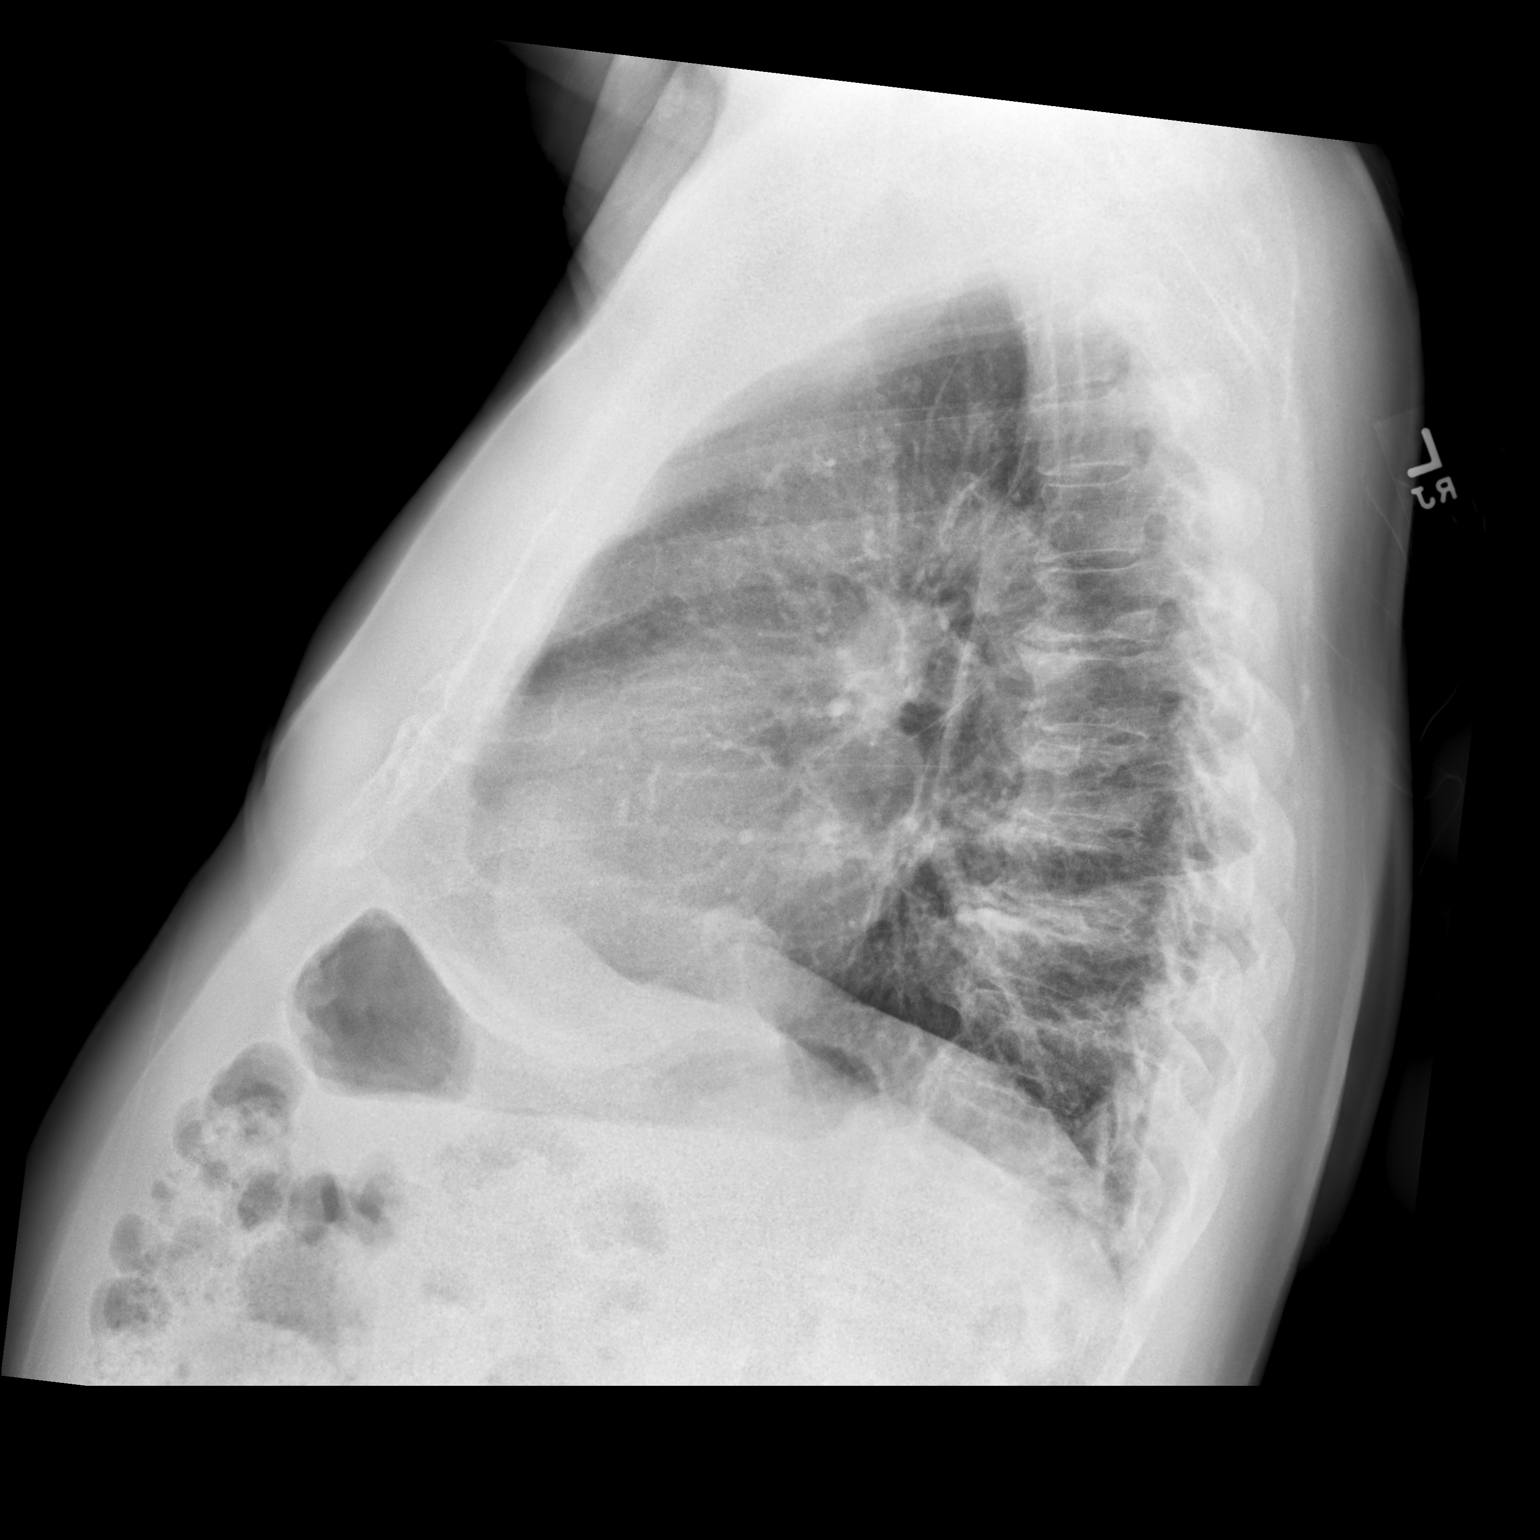

[2 of 2 positions shown; findings below may reference images not displayed]

FINDINGS: Heart size and mediastinal contours are stable. Lungs are clear. No
pleural effusion is seen. No acute appearing osseous abnormality.
Degenerative spondylosis of the thoracic spine, mild to moderate in
degree.
IMPRESSION: No active cardiopulmonary disease. No evidence of pneumonia or
pulmonary edema.

## 2020-07-29 MED ORDER — ANORO ELLIPTA 62.5-25 MCG/INH IN AEPB
1.0000 | INHALATION_SPRAY | Freq: Every day | RESPIRATORY_TRACT | 0 refills | Status: DC
Start: 2020-07-29 — End: 2020-08-08

## 2020-07-29 NOTE — Patient Instructions (Signed)
Chest xray today.  Continue on ANORO daily  Activity as tolerated.  Follow up with Dr. Vaughan Browner in 6 months and As needed

## 2020-07-29 NOTE — Assessment & Plan Note (Signed)
Healthy weight loss discussed 

## 2020-07-29 NOTE — Progress Notes (Signed)
@Patient  ID: Steven Newman, male    DOB: [birth year], 79 y.o.   MRN: [id number]  Chief Complaint  Patient presents with  . Follow-up    COPD     Referring provider: Leeroy Cha  HPI: 79 year old male former smoker followed for COPD    TEST/EVENTS :  Screening CT 02/02/2019-scattered bilateral calcified granuloma with small perifissural nodule.  PFTs: 08/05/2015 FVC 1.9 [4 5%], FEV1 1.3 [42%], F/F 68 Moderate airway obstruction  Labs: WBC 7.7, eos 10%, absolute eosinophil count 770  Workup  Pets: Cats Occupation: Used to Engineer, maintenance.  Was previously in the [place] in [place] Exposures: Exposure to agent orange in the past.  No ongoing exposures.  No mold, hot tub, Jacuzzi.  No down pillows or comforter Smoking history: 100-pack-year smoker.  Quit in 2016 Travel history: Originally from [place] and [place].  No significant recent travel Relevant family history: No significant family history of lung disease  07/29/2020 Follow up : COPD  Patient presents for a 81-month follow-up.  Patient says overall he is doing well.  He is on Anoro daily.  He denies any flare of his cough or wheezing.  Has rare albuterol use.  He says that he is not very active leads a sedentary lifestyle.  We discussed doing some light activities at home to try to keep his muscle tone doing well.  Patient says his weight has been stable he likes to eat too much.  We talked about healthy high-protein diet.  Allergies  Allergen Reactions  . Ace Inhibitors     Angioedema   . Penicillins Swelling and Rash    Lips swell    Immunization History  Administered Date(s) Administered  . Influenza Split 10/04/2014, 09/10/2015  . Influenza, High Dose Seasonal PF 09/13/2016, 09/19/2017, 09/25/2018  . Influenza,inj,Quad PF,6+ Mos 08/09/2013  . Pneumococcal Conjugate-13 12/21/2013  . Pneumococcal Polysaccharide-23 12/06/2008, 12/06/2010  . Zoster Recombinat (Shingrix) 10/05/2019    Past  Medical History:  Diagnosis Date  . CAD (coronary artery disease)    coronary angioplasty 1995  . Dyspnea   . Erectile dysfunction   . History of bladder cancer   . Hx of colonic polyps   . Hyperlipidemia   . Hypertension   . Kidney stones   . MI (myocardial infarction) ([place])    1976, 1985  . Obesity   . Pneumonia   . Skin cancer   . Type 2 diabetes mellitus ([place])   . Wheezing     Tobacco History: Social History   Tobacco Use  Smoking Status Former Smoker  . Packs/day: 2.00  . Years: 50.00  . Pack years: 100.00  . Types: Cigarettes  . Quit date: 06/20/2015  . Years since quitting: 5.1  Smokeless Tobacco Never Used   Counseling given: Not Answered   Outpatient Medications Prior to Visit  Medication Sig Dispense Refill  . albuterol (PROVENTIL) (2.5 MG/3ML) 0.083% nebulizer solution Take 2.5 mg by nebulization as needed for wheezing or shortness of breath.    . ASPIRIN LOW DOSE 81 MG EC tablet TAKE 1 TABLET DAILY 90 tablet 3  . atorvastatin (LIPITOR) 40 MG tablet TAKE 1 TABLET BY MOUTH DAILY. 90 tablet 3  . [place]wellia-Glucosamine-Vit D (OSTEO BI-FLEX ONE PER DAY PO) Take 2 tablets by mouth daily.    . Coenzyme Q10 (CO Q-10) 100 MG CAPS Take 1 capsule by mouth daily.    . ergocalciferol (VITAMIN D2) 1.25 MG (50000 UT) capsule Take 50,000 Units by mouth once  a week. Currently taking twice a week 01/01/20    . JARDIANCE 25 MG TABS tablet Take 25 mg by mouth daily.    . nadolol (CORGARD) 40 MG tablet Take 1 tablet (40 mg total) by mouth daily. 90 tablet 3  . nitroGLYCERIN (NITROSTAT) 0.4 MG SL tablet Place 1 tablet (0.4 mg total) under the tongue every 5 (five) minutes as needed for chest pain. 25 tablet 3  . Omega-3 Fatty Acids (RA FISH OIL) 1400 MG CPDR Take 1 capsule by mouth daily.     Marland Kitchen PRESCRIPTION MEDICATION Take 1 tablet by mouth daily. Triflex for arthritis as needed     . TURMERIC PO Take 1,000 mg daily by mouth.    Marland Kitchen UNABLE TO FIND Take 5 drops 2 (two) times daily  by mouth. Med Name: [employer]    . ANORO ELLIPTA 62.5-25 MCG/INH AEPB INHALE 1 PUFF INTO THE LUNGS DAILY 60 each 0   No facility-administered medications prior to visit.     Review of Systems:   Constitutional:   No  weight loss, night sweats,  Fevers, chills, fatigue, or  lassitude.  HEENT:   No headaches,  Difficulty swallowing,  Tooth/dental problems, or  Sore throat,                No sneezing, itching, ear ache, nasal congestion, post nasal drip,   CV:  No chest pain,  Orthopnea, PND, swelling in lower extremities, anasarca, dizziness, palpitations, syncope.   GI  No heartburn, indigestion, abdominal pain, nausea, vomiting, diarrhea, change in bowel habits, loss of appetite, bloody stools.   Resp:    No excess mucus, no productive cough,  No non-productive cough,  No coughing up of blood.  No change in color of mucus.  No wheezing.  No chest wall deformity  Skin: no rash or lesions.  GU: no dysuria, change in color of urine, no urgency or frequency.  No flank pain, no hematuria   MS:  No joint pain or swelling.  No decreased range of motion.  No back pain.    Physical Exam  BP 132/82 (BP Location: Left Arm, Cuff Size: Normal)   Pulse 63   Temp 97.6 F (36.4 C) (Oral)   Ht 5' 9.5" (1.765 m)   Wt 223 lb (101.2 kg)   SpO2 95%   BMI 32.46 kg/m   GEN: A/Ox3; pleasant , NAD, well nourished    HEENT:  [place]/AT,  , NOSE-clear, THROAT-clear, no lesions, no postnasal drip or exudate noted.   NECK:  Supple w/ fair ROM; no JVD; normal carotid impulses w/o bruits; no thyromegaly or nodules palpated; no lymphadenopathy.    RESP  Clear  P & A; w/o, wheezes/ rales/ or rhonchi. no accessory muscle use, no dullness to percussion  CARD:  RRR, no m/r/g, no peripheral edema, pulses intact, no cyanosis or clubbing.  GI:   Soft & nt; nml bowel sounds; no organomegaly or masses detected.   Musco: Warm bil, no deformities or joint swelling noted.   Neuro: alert, no focal  deficits noted.    Skin: Warm, no lesions or rashes    BMET   BNP No results found for: BNP  ProBNP No results found for: PROBNP  Imaging: No results found.    No flowsheet data found.  No results found for: NITRICOXIDE      Assessment & Plan:   COPD with emphysema ([place]) Compensated on present regimen.  Patient is encouraged on activity as  tolerated.  Chest x-ray today.  Plan  Patient Instructions  Chest xray today.  Continue on ANORO daily  Activity as tolerated.  Follow up with Dr. Vaughan Browner in 6 months and As needed     '   Obesity Healthy weight loss discussed.     Rexene Edison, NP 07/29/2020

## 2020-07-29 NOTE — Assessment & Plan Note (Signed)
Compensated on present regimen.  Patient is encouraged on activity as tolerated.  Chest x-ray today.  Plan  Patient Instructions  Chest xray today.  Continue on ANORO daily  Activity as tolerated.  Follow up with Dr. Vaughan Browner in 6 months and As needed     '

## 2020-07-31 DIAGNOSIS — E7800 Pure hypercholesterolemia, unspecified: Secondary | ICD-10-CM | POA: Diagnosis not present

## 2020-07-31 DIAGNOSIS — J449 Chronic obstructive pulmonary disease, unspecified: Secondary | ICD-10-CM | POA: Diagnosis not present

## 2020-07-31 DIAGNOSIS — M150 Primary generalized (osteo)arthritis: Secondary | ICD-10-CM | POA: Diagnosis not present

## 2020-07-31 DIAGNOSIS — N183 Chronic kidney disease, stage 3 unspecified: Secondary | ICD-10-CM | POA: Diagnosis not present

## 2020-07-31 DIAGNOSIS — I10 Essential (primary) hypertension: Secondary | ICD-10-CM | POA: Diagnosis not present

## 2020-07-31 DIAGNOSIS — E1169 Type 2 diabetes mellitus with other specified complication: Secondary | ICD-10-CM | POA: Diagnosis not present

## 2020-07-31 DIAGNOSIS — I2510 Atherosclerotic heart disease of native coronary artery without angina pectoris: Secondary | ICD-10-CM | POA: Diagnosis not present

## 2020-07-31 DIAGNOSIS — I252 Old myocardial infarction: Secondary | ICD-10-CM | POA: Diagnosis not present

## 2020-08-05 DIAGNOSIS — I2510 Atherosclerotic heart disease of native coronary artery without angina pectoris: Secondary | ICD-10-CM | POA: Diagnosis not present

## 2020-08-05 DIAGNOSIS — J449 Chronic obstructive pulmonary disease, unspecified: Secondary | ICD-10-CM | POA: Diagnosis not present

## 2020-08-05 DIAGNOSIS — N183 Chronic kidney disease, stage 3 unspecified: Secondary | ICD-10-CM | POA: Diagnosis not present

## 2020-08-05 DIAGNOSIS — E559 Vitamin D deficiency, unspecified: Secondary | ICD-10-CM | POA: Diagnosis not present

## 2020-08-05 DIAGNOSIS — Z7984 Long term (current) use of oral hypoglycemic drugs: Secondary | ICD-10-CM | POA: Diagnosis not present

## 2020-08-05 DIAGNOSIS — E1169 Type 2 diabetes mellitus with other specified complication: Secondary | ICD-10-CM | POA: Diagnosis not present

## 2020-08-07 DIAGNOSIS — H04123 Dry eye syndrome of bilateral lacrimal glands: Secondary | ICD-10-CM | POA: Diagnosis not present

## 2020-08-07 DIAGNOSIS — Z961 Presence of intraocular lens: Secondary | ICD-10-CM | POA: Diagnosis not present

## 2020-08-08 ENCOUNTER — Telehealth: Payer: Self-pay | Admitting: Pulmonary Disease

## 2020-08-08 MED ORDER — ANORO ELLIPTA 62.5-25 MCG/INH IN AEPB
1.0000 | INHALATION_SPRAY | Freq: Every day | RESPIRATORY_TRACT | 5 refills | Status: DC
Start: 2020-08-08 — End: 2021-03-21

## 2020-08-08 NOTE — Telephone Encounter (Signed)
[facility] requested a Anoro prescription for Patient.  Iona Coach prescription faxed to [facility] team. Nothing further at this time.

## 2020-08-19 DIAGNOSIS — Z85828 Personal history of other malignant neoplasm of skin: Secondary | ICD-10-CM | POA: Diagnosis not present

## 2020-08-19 DIAGNOSIS — L0109 Other impetigo: Secondary | ICD-10-CM | POA: Diagnosis not present

## 2020-08-27 DIAGNOSIS — M150 Primary generalized (osteo)arthritis: Secondary | ICD-10-CM | POA: Diagnosis not present

## 2020-08-27 DIAGNOSIS — N183 Chronic kidney disease, stage 3 unspecified: Secondary | ICD-10-CM | POA: Diagnosis not present

## 2020-08-27 DIAGNOSIS — E1169 Type 2 diabetes mellitus with other specified complication: Secondary | ICD-10-CM | POA: Diagnosis not present

## 2020-08-27 DIAGNOSIS — J449 Chronic obstructive pulmonary disease, unspecified: Secondary | ICD-10-CM | POA: Diagnosis not present

## 2020-08-27 DIAGNOSIS — I2510 Atherosclerotic heart disease of native coronary artery without angina pectoris: Secondary | ICD-10-CM | POA: Diagnosis not present

## 2020-08-27 DIAGNOSIS — I252 Old myocardial infarction: Secondary | ICD-10-CM | POA: Diagnosis not present

## 2020-08-27 DIAGNOSIS — E7800 Pure hypercholesterolemia, unspecified: Secondary | ICD-10-CM | POA: Diagnosis not present

## 2020-08-27 DIAGNOSIS — I10 Essential (primary) hypertension: Secondary | ICD-10-CM | POA: Diagnosis not present

## 2020-09-04 DIAGNOSIS — Z23 Encounter for immunization: Secondary | ICD-10-CM | POA: Diagnosis not present

## 2020-09-10 DIAGNOSIS — I252 Old myocardial infarction: Secondary | ICD-10-CM | POA: Diagnosis not present

## 2020-09-10 DIAGNOSIS — I10 Essential (primary) hypertension: Secondary | ICD-10-CM | POA: Diagnosis not present

## 2020-09-10 DIAGNOSIS — N183 Chronic kidney disease, stage 3 unspecified: Secondary | ICD-10-CM | POA: Diagnosis not present

## 2020-09-10 DIAGNOSIS — I2510 Atherosclerotic heart disease of native coronary artery without angina pectoris: Secondary | ICD-10-CM | POA: Diagnosis not present

## 2020-09-10 DIAGNOSIS — J449 Chronic obstructive pulmonary disease, unspecified: Secondary | ICD-10-CM | POA: Diagnosis not present

## 2020-09-10 DIAGNOSIS — M150 Primary generalized (osteo)arthritis: Secondary | ICD-10-CM | POA: Diagnosis not present

## 2020-09-10 DIAGNOSIS — E1169 Type 2 diabetes mellitus with other specified complication: Secondary | ICD-10-CM | POA: Diagnosis not present

## 2020-09-10 DIAGNOSIS — E7800 Pure hypercholesterolemia, unspecified: Secondary | ICD-10-CM | POA: Diagnosis not present

## 2020-09-29 DIAGNOSIS — Z23 Encounter for immunization: Secondary | ICD-10-CM | POA: Diagnosis not present

## 2020-10-22 DIAGNOSIS — L570 Actinic keratosis: Secondary | ICD-10-CM | POA: Diagnosis not present

## 2020-10-22 DIAGNOSIS — Z85828 Personal history of other malignant neoplasm of skin: Secondary | ICD-10-CM | POA: Diagnosis not present

## 2020-10-28 DIAGNOSIS — Z1389 Encounter for screening for other disorder: Secondary | ICD-10-CM | POA: Diagnosis not present

## 2020-10-28 DIAGNOSIS — Z23 Encounter for immunization: Secondary | ICD-10-CM | POA: Diagnosis not present

## 2020-10-28 DIAGNOSIS — E559 Vitamin D deficiency, unspecified: Secondary | ICD-10-CM | POA: Diagnosis not present

## 2020-10-28 DIAGNOSIS — N3941 Urge incontinence: Secondary | ICD-10-CM | POA: Diagnosis not present

## 2020-10-28 DIAGNOSIS — Z0000 Encounter for general adult medical examination without abnormal findings: Secondary | ICD-10-CM | POA: Diagnosis not present

## 2020-10-28 DIAGNOSIS — Z7189 Other specified counseling: Secondary | ICD-10-CM | POA: Diagnosis not present

## 2020-10-28 DIAGNOSIS — J449 Chronic obstructive pulmonary disease, unspecified: Secondary | ICD-10-CM | POA: Diagnosis not present

## 2020-10-28 DIAGNOSIS — I10 Essential (primary) hypertension: Secondary | ICD-10-CM | POA: Diagnosis not present

## 2020-10-28 DIAGNOSIS — E1169 Type 2 diabetes mellitus with other specified complication: Secondary | ICD-10-CM | POA: Diagnosis not present

## 2020-10-28 DIAGNOSIS — I2510 Atherosclerotic heart disease of native coronary artery without angina pectoris: Secondary | ICD-10-CM | POA: Diagnosis not present

## 2020-10-28 DIAGNOSIS — N183 Chronic kidney disease, stage 3 unspecified: Secondary | ICD-10-CM | POA: Diagnosis not present

## 2020-10-28 DIAGNOSIS — E7800 Pure hypercholesterolemia, unspecified: Secondary | ICD-10-CM | POA: Diagnosis not present

## 2020-11-03 DIAGNOSIS — E1169 Type 2 diabetes mellitus with other specified complication: Secondary | ICD-10-CM | POA: Diagnosis not present

## 2020-11-03 DIAGNOSIS — I252 Old myocardial infarction: Secondary | ICD-10-CM | POA: Diagnosis not present

## 2020-11-03 DIAGNOSIS — M150 Primary generalized (osteo)arthritis: Secondary | ICD-10-CM | POA: Diagnosis not present

## 2020-11-03 DIAGNOSIS — E7800 Pure hypercholesterolemia, unspecified: Secondary | ICD-10-CM | POA: Diagnosis not present

## 2020-11-03 DIAGNOSIS — N183 Chronic kidney disease, stage 3 unspecified: Secondary | ICD-10-CM | POA: Diagnosis not present

## 2020-11-03 DIAGNOSIS — J449 Chronic obstructive pulmonary disease, unspecified: Secondary | ICD-10-CM | POA: Diagnosis not present

## 2020-11-03 DIAGNOSIS — I10 Essential (primary) hypertension: Secondary | ICD-10-CM | POA: Diagnosis not present

## 2020-11-03 DIAGNOSIS — I2510 Atherosclerotic heart disease of native coronary artery without angina pectoris: Secondary | ICD-10-CM | POA: Diagnosis not present

## 2020-11-12 NOTE — Progress Notes (Signed)
Cardiology Office Note   Date:  11/13/2020   ID:  [name], DOB 1941-04-11, MRN [id number]  PCP:  Leeroy Cha, MD    No chief complaint on file.  CAD  Wt Readings from Last 3 Encounters:  11/13/20 213 lb 6.4 oz (96.8 kg)  07/29/20 223 lb (101.2 kg)  01/22/20 226 lb (102.5 kg)       History of Present Illness: JACKSTON OAXACA is a 79 y.o. male  with CADwith several MIs in the past and a PTCA in the mid 90s.  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 He has been treated medically due to lack of claudication.  In the past,He reported chronic exertional dyspnea which limits his activities. He is actually more limited by this than anything else.   Hospitalized in [place] in 2017 for COPD.   Since the last visit, his memory is worse.  His wife accompanies him today.  Reports an occasional dizzy spell when he stands up.   Denies : Chest pain. Dizziness. Leg edema. Nitroglycerin use. Orthopnea. Palpitations. Paroxysmal nocturnal dyspnea. Syncope.   Followed by pulmonary for COPD/emphysema.  Has not used albuterol recently.  He had all three COVID shots and a flu shot.  No problems.       Past Medical History:  Diagnosis Date  . CAD (coronary artery disease)    coronary angioplasty 1995  . Dyspnea   . Erectile dysfunction   . History of bladder cancer   . Hx of colonic polyps   . Hyperlipidemia   . Hypertension   . Kidney stones   . MI (myocardial infarction) ([place])    1976, 1985  . Obesity   . Pneumonia   . Skin cancer   . Type 2 diabetes mellitus ([place])   . Wheezing     Past Surgical History:  Procedure Laterality Date  . bladder cancer surgery    . 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 neck tumor       Current Outpatient Medications  Medication Sig Dispense Refill  . albuterol (PROVENTIL) (2.5 MG/3ML) 0.083% nebulizer solution Take 2.5 mg by nebulization as needed  for wheezing or shortness of breath.    . ASPIRIN LOW DOSE 81 MG EC tablet TAKE 1 TABLET DAILY 90 tablet 3  . atorvastatin (LIPITOR) 40 MG tablet TAKE 1 TABLET BY MOUTH DAILY. 90 tablet 3  . Boswellia-Glucosamine-Vit D (OSTEO BI-FLEX ONE PER DAY PO) Take 2 tablets by mouth daily.    . Coenzyme Q10 (CO Q-10) 100 MG CAPS Take 1 capsule by mouth daily.    . ergocalciferol (VITAMIN D2) 1.25 MG (50000 UT) capsule Take 50,000 Units by mouth once a week. Currently taking twice a week 01/01/20    . JARDIANCE 25 MG TABS tablet Take 25 mg by mouth daily.    . nadolol (CORGARD) 40 MG tablet Take 1 tablet (40 mg total) by mouth daily. 90 tablet 3  . nitroGLYCERIN (NITROSTAT) 0.4 MG SL tablet Place 1 tablet (0.4 mg total) under the tongue every 5 (five) minutes as needed for chest pain. 25 tablet 3  . Omega-3 Fatty Acids (RA FISH OIL) 1400 MG CPDR Take 1 capsule by mouth daily.     Marland Kitchen PRESCRIPTION MEDICATION Take 1 tablet by mouth daily. Triflex for arthritis as needed    . TURMERIC PO Take 1,000 mg daily by mouth.    . umeclidinium-vilanterol (ANORO ELLIPTA)  62.5-25 MCG/INH AEPB Inhale 1 puff into the lungs daily. 60 each 5  . UNABLE TO FIND Take 5 drops by mouth 2 (two) times daily. Med Name: [employer]     No current facility-administered medications for this visit.    Allergies:   Ace inhibitors and Penicillins    Social History:  The patient  reports that he quit smoking about 5 years ago. His smoking use included cigarettes. He has a 100.00 pack-year smoking history. He has never used smokeless tobacco. He reports current alcohol use. He reports that he does not use drugs.   Family History:  The patient's family history includes Cancer in his sister; Diabetes in his sister; Heart disease in his mother; Rheumatic fever in his brother and father.    ROS:  Please see the history of present illness.   Otherwise, review of systems are positive for chronic DOE, memory getting worse.   All other  systems are reviewed and negative.    PHYSICAL EXAM: VS:  BP 134/84   Pulse (!) 57   Ht 5' 9.5" (1.765 m)   Wt 213 lb 6.4 oz (96.8 kg)   SpO2 92%   BMI 31.06 kg/m  , BMI Body mass index is 31.06 kg/m. GEN: Well nourished, well developed, in no acute distress  HEENT: normal  Neck: no JVD, carotid bruits, or masses Cardiac: RRR; no murmurs, rubs, or gallops,no edema  Respiratory:  clear to auscultation bilaterally, normal work of breathing GI: soft, nontender, nondistended, + BS, obese MS: no deformity or atrophy  Skin: warm and dry, no rash Neuro:  Strength and sensation are intact Psych: euthymic mood, full affect   EKG:   The ekg ordered today demonstrates NSR, RBBB, inferior MI   Recent Labs: No results found for requested labs within last 8760 hours.   Lipid Panel    Component Value Date/Time   CHOL 159 12/23/2014 0923   TRIG 129 12/23/2014 0923   HDL 45 12/23/2014 0923   LDLCALC 88 12/23/2014 0923     Other studies Reviewed: Additional studies/ records that were reviewed today with results demonstrating: 2021 labs reviewed- LDL 73; TG 240.    ASSESSMENT AND PLAN:  1. CAD/Old MI: No angina. COntinue aggressive secondary prevention.  2. AFB:XUXYBFX management given no sx. FOllows with Dr. Fletcher Anon. 3. DM: A1C 7.0.  High fiber diet.  Regular salad intake. Avoid processed food.  4. HTN: The current medical regimen is effective;  continue present plan and medications. 5. AAA: 3.2 cm.  Avoid heavy lifting. Continue statin.    Current medicines are reviewed at length with the patient today.  The patient concerns regarding his medicines were addressed.  The following changes have been made:  No change  Labs/ tests ordered today include:  No orders of the defined types were placed in this encounter.   Recommend 150 minutes/week of aerobic exercise Low fat, low carb, high fiber diet recommended  Disposition:   FU in 1 year   Signed, Larae Grooms, MD   11/13/2020 10:35 AM    [facility] [address], [place], [place]  [address] Phone: [phone]; Fax: [phone]

## 2020-11-13 ENCOUNTER — Encounter: Payer: Self-pay | Admitting: Interventional Cardiology

## 2020-11-13 ENCOUNTER — Other Ambulatory Visit: Payer: Self-pay

## 2020-11-13 ENCOUNTER — Ambulatory Visit (INDEPENDENT_AMBULATORY_CARE_PROVIDER_SITE_OTHER): Payer: Medicare Other | Admitting: Interventional Cardiology

## 2020-11-13 VITALS — BP 134/84 | HR 57 | Ht 69.5 in | Wt 213.4 lb

## 2020-11-13 DIAGNOSIS — I252 Old myocardial infarction: Secondary | ICD-10-CM

## 2020-11-13 DIAGNOSIS — I714 Abdominal aortic aneurysm, without rupture, unspecified: Secondary | ICD-10-CM

## 2020-11-13 DIAGNOSIS — E782 Mixed hyperlipidemia: Secondary | ICD-10-CM | POA: Diagnosis not present

## 2020-11-13 DIAGNOSIS — I739 Peripheral vascular disease, unspecified: Secondary | ICD-10-CM

## 2020-11-13 DIAGNOSIS — I2510 Atherosclerotic heart disease of native coronary artery without angina pectoris: Secondary | ICD-10-CM | POA: Diagnosis not present

## 2020-11-13 NOTE — Patient Instructions (Signed)
Medication Instructions:  Your physician recommends that you continue on your current medications as directed. Please refer to the Current Medication list given to you today.  *If you need a refill on your cardiac medications before your next appointment, please call your pharmacy*   Lab Work: None  If you have labs (blood work) drawn today and your tests are completely normal, you will receive your results only by: . MyChart Message (if you have MyChart) OR . A paper copy in the mail If you have any lab test that is abnormal or we need to change your treatment, we will call you to review the results.   Testing/Procedures: None  Follow-Up: At [facility], you and your health needs are our priority.  As part of our continuing mission to provide you with exceptional heart care, we have created designated Provider Care Teams.  These Care Teams include your primary Cardiologist (physician) and Advanced Practice Providers (APPs -  Physician Assistants and Nurse Practitioners) who all work together to provide you with the care you need, when you need it.  We recommend signing up for the patient portal called "MyChart".  Sign up information is provided on this After Visit Summary.  MyChart is used to connect with patients for Virtual Visits (Telemedicine).  Patients are able to view lab/test results, encounter notes, upcoming appointments, etc.  Non-urgent messages can be sent to your provider as well.   To learn more about what you can do with MyChart, go to [email or web address].    Your next appointment:   12 month(s)  The format for your next appointment:   In Person  Provider:   You may see Jayadeep Varanasi, MD or one of the following Advanced Practice Providers on your designated Care Team:    Dayna Dunn, PA-C  Michele Lenze, PA-C    Other Instructions  High-Fiber Diet Fiber, also called dietary fiber, is a type of carbohydrate that is found in fruits, vegetables, whole  grains, and beans. A high-fiber diet can have many health benefits. Your health care provider may recommend a high-fiber diet to help:  Prevent constipation. Fiber can make your bowel movements more regular.  Lower your cholesterol.  Relieve the following conditions: ? Swelling of veins in the anus (hemorrhoids). ? Swelling and irritation (inflammation) of specific areas of the digestive tract (uncomplicated diverticulosis). ? A problem of the large intestine (colon) that sometimes causes pain and diarrhea (irritable bowel syndrome, IBS).  Prevent overeating as part of a weight-loss plan.  Prevent heart disease, type 2 diabetes, and certain cancers. What is my plan? The recommended daily fiber intake in grams (g) includes:  38 g for men age 50 or younger.  30 g for men over age 50.  25 g for women age 50 or younger.  21 g for women over age 50. You can get the recommended daily intake of dietary fiber by:  Eating a variety of fruits, vegetables, grains, and beans.  Taking a fiber supplement, if it is not possible to get enough fiber through your diet. What do I need to know about a high-fiber diet?  It is better to get fiber through food sources rather than from fiber supplements. There is not a lot of research about how effective supplements are.  Always check the fiber content on the nutrition facts label of any prepackaged food. Look for foods that contain 5 g of fiber or more per serving.  Talk with a diet and nutrition specialist (dietitian) if you   have questions about specific foods that are recommended or not recommended for your medical condition, especially if those foods are not listed below.  Gradually increase how much fiber you consume. If you increase your intake of dietary fiber too quickly, you may have bloating, cramping, or gas.  Drink plenty of water. Water helps you to digest fiber. What are tips for following this plan?  Eat a wide variety of high-fiber  foods.  Make sure that half of the grains that you eat each day are whole grains.  Eat breads and cereals that are made with whole-grain flour instead of refined flour or white flour.  Eat brown rice, bulgur wheat, or millet instead of white rice.  Start the day with a breakfast that is high in fiber, such as a cereal that contains 5 g of fiber or more per serving.  Use beans in place of meat in soups, salads, and pasta dishes.  Eat high-fiber snacks, such as berries, raw vegetables, nuts, and popcorn.  Choose whole fruits and vegetables instead of processed forms like juice or sauce. What foods can I eat?  Fruits Berries. Pears. Apples. Oranges. Avocado. Prunes and raisins. Dried figs. Vegetables Sweet potatoes. Spinach. Kale. Artichokes. Cabbage. Broccoli. Cauliflower. Green peas. Carrots. Squash. Grains Whole-grain breads. Multigrain cereal. Oats and oatmeal. Brown rice. Barley. Bulgur wheat. Millet. Quinoa. Bran muffins. Popcorn. Rye wafer crackers. Meats and other proteins Navy, kidney, and pinto beans. Soybeans. Split peas. Lentils. Nuts and seeds. Dairy Fiber-fortified yogurt. Beverages Fiber-fortified soy milk. Fiber-fortified orange juice. Other foods Fiber bars. The items listed above may not be a complete list of recommended foods and beverages. Contact a dietitian for more options. What foods are not recommended? Fruits Fruit juice. Cooked, strained fruit. Vegetables Fried potatoes. Canned vegetables. Well-cooked vegetables. Grains White bread. Pasta made with refined flour. White rice. Meats and other proteins Fatty cuts of meat. Fried chicken or fried fish. Dairy Milk. Yogurt. Cream cheese. Sour cream. Fats and oils Butters. Beverages Soft drinks. Other foods Cakes and pastries. The items listed above may not be a complete list of foods and beverages to avoid. Contact a dietitian for more information. Summary  Fiber is a type of carbohydrate. It is  found in fruits, vegetables, whole grains, and beans.  There are many health benefits of eating a high-fiber diet, such as preventing constipation, lowering blood cholesterol, helping with weight loss, and reducing your risk of heart disease, diabetes, and certain cancers.  Gradually increase your intake of fiber. Increasing too fast can result in cramping, bloating, and gas. Drink plenty of water while you increase your fiber.  The best sources of fiber include whole fruits and vegetables, whole grains, nuts, seeds, and beans. This information is not intended to replace advice given to you by your health care provider. Make sure you discuss any questions you have with your health care provider. Document Revised: 09/26/2017 Document Reviewed: 09/26/2017 Elsevier Patient Education  2020 Elsevier Inc.   

## 2020-11-24 DIAGNOSIS — J449 Chronic obstructive pulmonary disease, unspecified: Secondary | ICD-10-CM | POA: Diagnosis not present

## 2020-11-24 DIAGNOSIS — I10 Essential (primary) hypertension: Secondary | ICD-10-CM | POA: Diagnosis not present

## 2020-11-24 DIAGNOSIS — I2510 Atherosclerotic heart disease of native coronary artery without angina pectoris: Secondary | ICD-10-CM | POA: Diagnosis not present

## 2020-11-24 DIAGNOSIS — E7800 Pure hypercholesterolemia, unspecified: Secondary | ICD-10-CM | POA: Diagnosis not present

## 2020-11-24 DIAGNOSIS — E1169 Type 2 diabetes mellitus with other specified complication: Secondary | ICD-10-CM | POA: Diagnosis not present

## 2020-11-24 DIAGNOSIS — M150 Primary generalized (osteo)arthritis: Secondary | ICD-10-CM | POA: Diagnosis not present

## 2020-11-24 DIAGNOSIS — N183 Chronic kidney disease, stage 3 unspecified: Secondary | ICD-10-CM | POA: Diagnosis not present

## 2020-11-24 DIAGNOSIS — I252 Old myocardial infarction: Secondary | ICD-10-CM | POA: Diagnosis not present

## 2020-12-08 DIAGNOSIS — Z20822 Contact with and (suspected) exposure to covid-19: Secondary | ICD-10-CM | POA: Diagnosis not present

## 2020-12-12 ENCOUNTER — Ambulatory Visit
Admission: RE | Admit: 2020-12-12 | Discharge: 2020-12-12 | Disposition: A | Discharge status: Home / Self Care | Payer: Medicare Other | Source: Ambulatory Visit | Attending: Internal Medicine | Admitting: Internal Medicine

## 2020-12-12 ENCOUNTER — Other Ambulatory Visit: Payer: Self-pay | Admitting: Internal Medicine

## 2020-12-12 DIAGNOSIS — W19XXXA Unspecified fall, initial encounter: Secondary | ICD-10-CM | POA: Diagnosis not present

## 2020-12-12 DIAGNOSIS — T1490XA Injury, unspecified, initial encounter: Secondary | ICD-10-CM

## 2020-12-12 DIAGNOSIS — R2681 Unsteadiness on feet: Secondary | ICD-10-CM | POA: Diagnosis not present

## 2020-12-12 DIAGNOSIS — I10 Essential (primary) hypertension: Secondary | ICD-10-CM | POA: Diagnosis not present

## 2020-12-12 DIAGNOSIS — S4992XA Unspecified injury of left shoulder and upper arm, initial encounter: Secondary | ICD-10-CM | POA: Diagnosis not present

## 2020-12-12 IMAGING — CR DG SHOULDER 2+V*L*
3 series · 3 of 3 positions shown · non-contrast
Comparison: February 23, 2015

CLINICAL DATA: Injury of the left shoulder.

EXAM:
LEFT SHOULDER - 2+ VIEW

[w shoulder ap external left *]
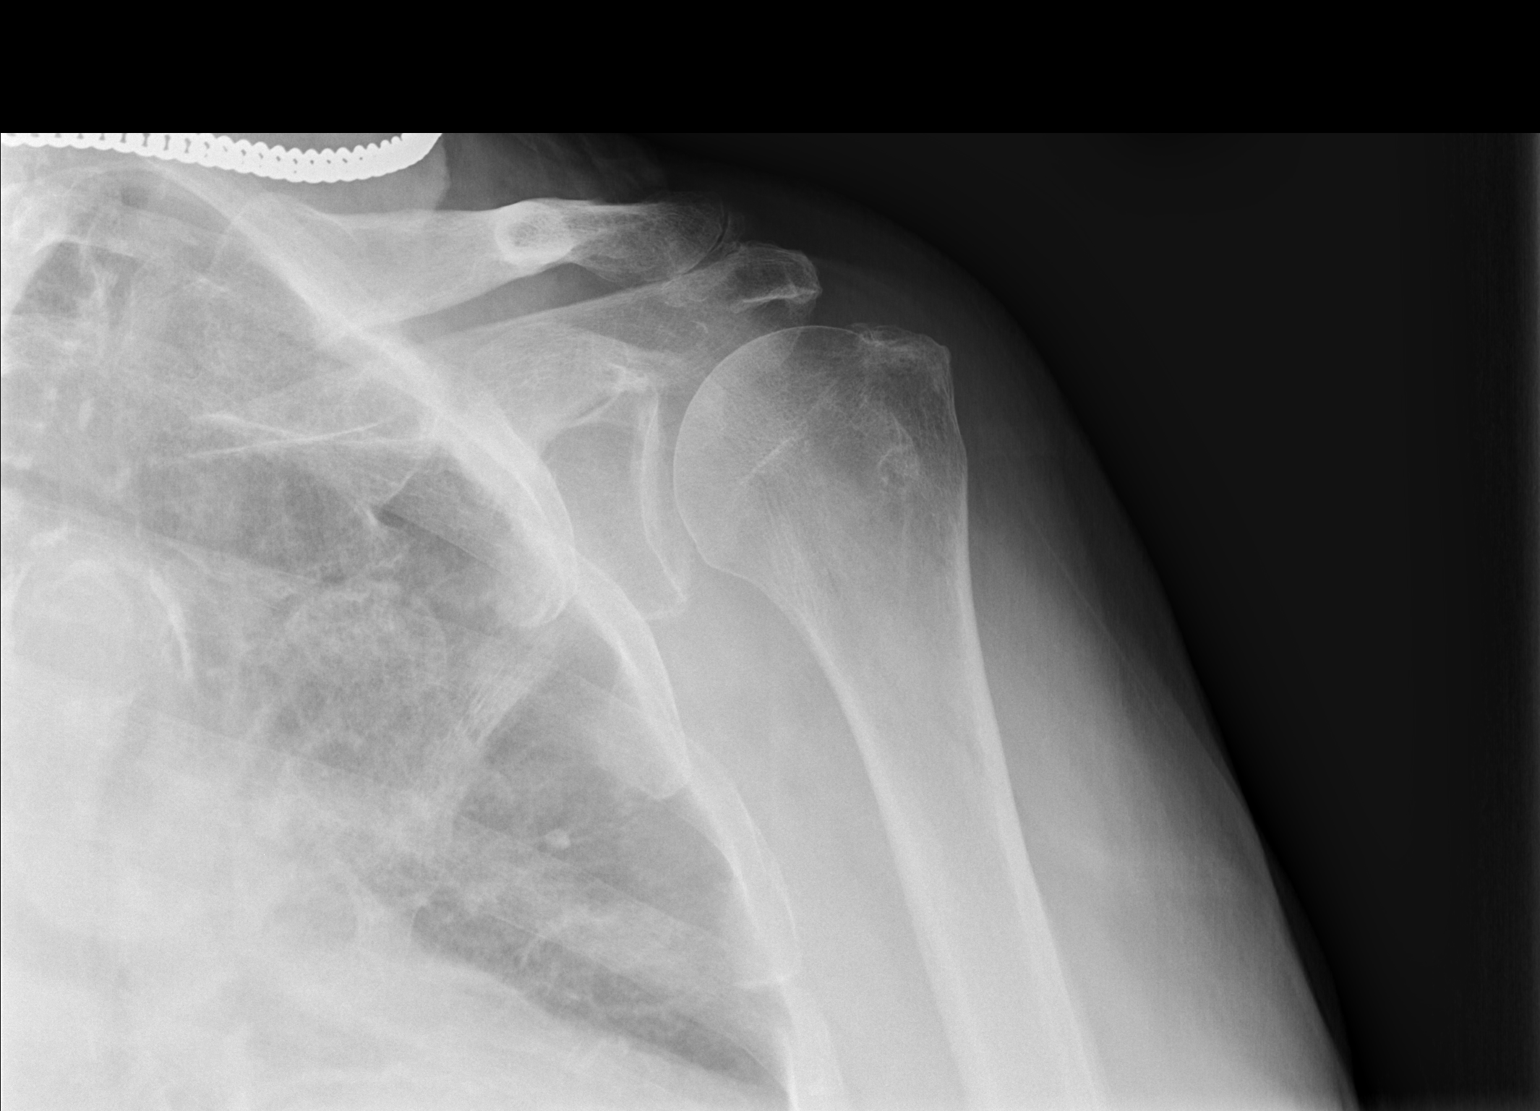

[w shoulder y view left * (1 of 2)]
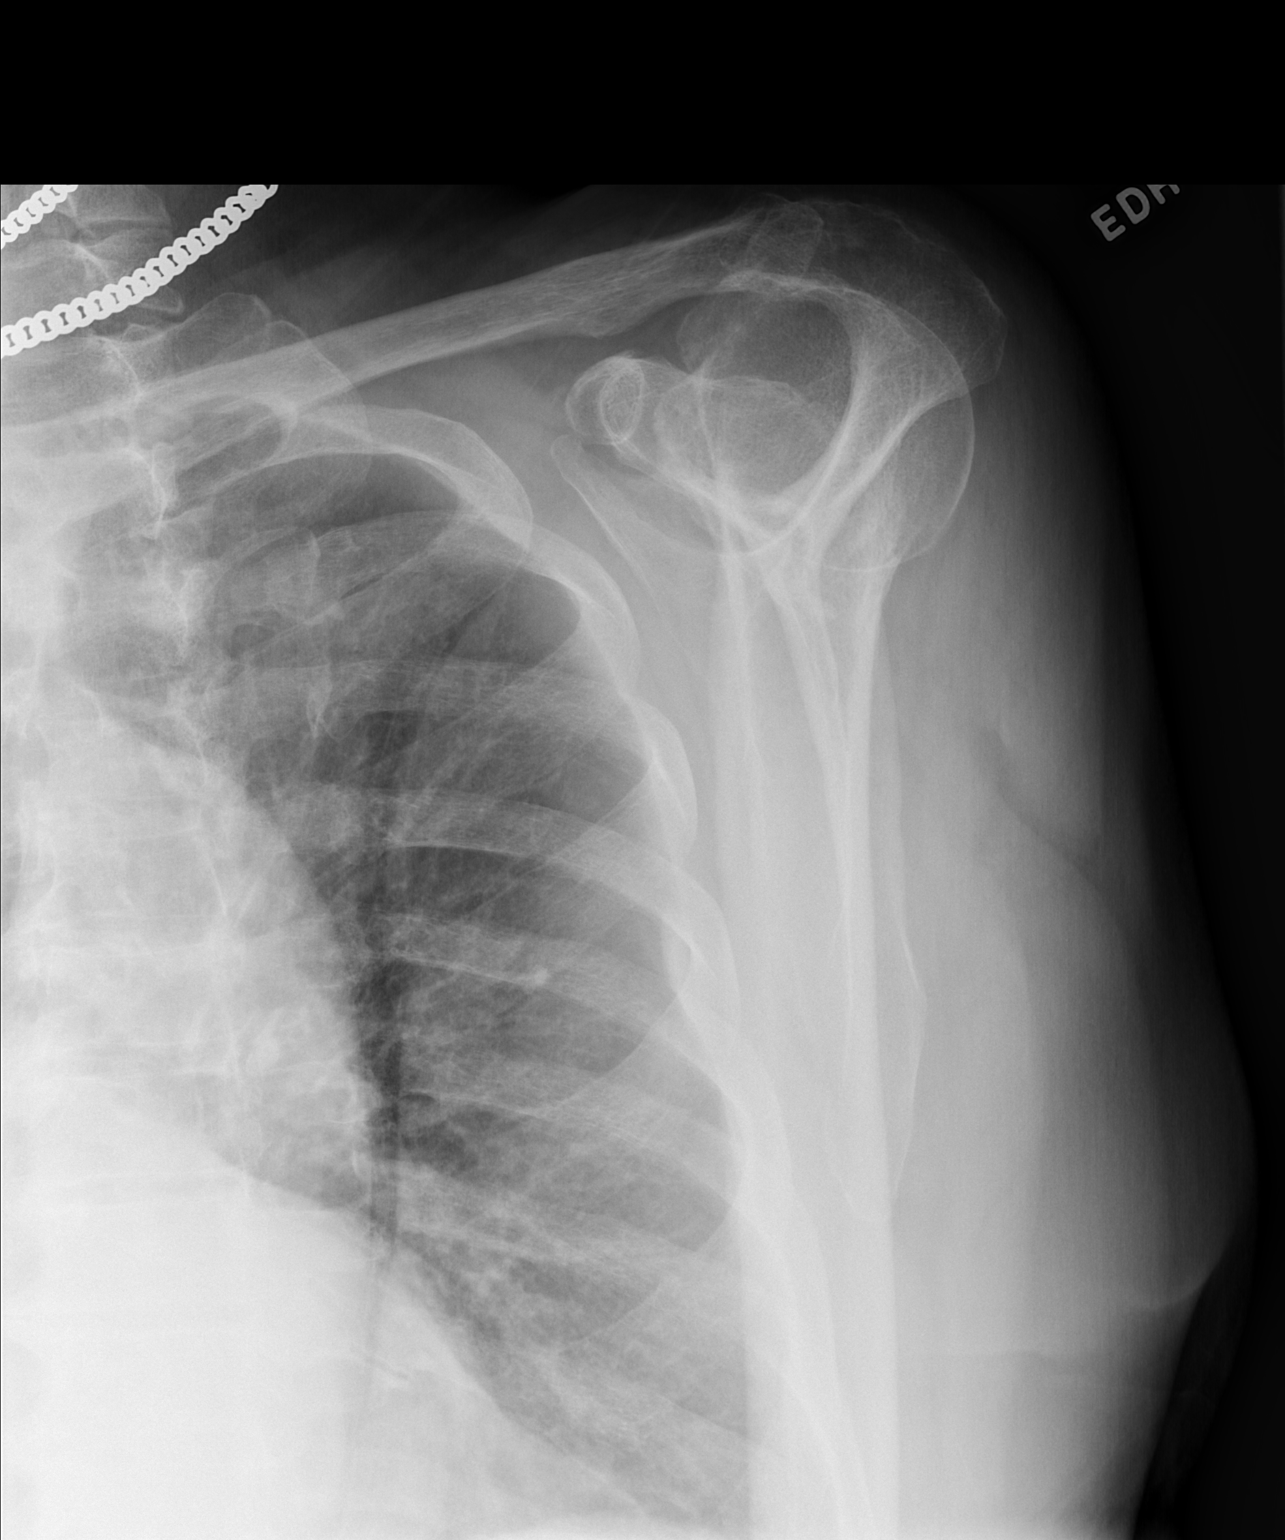

[w shoulder y view left * (2 of 2)]
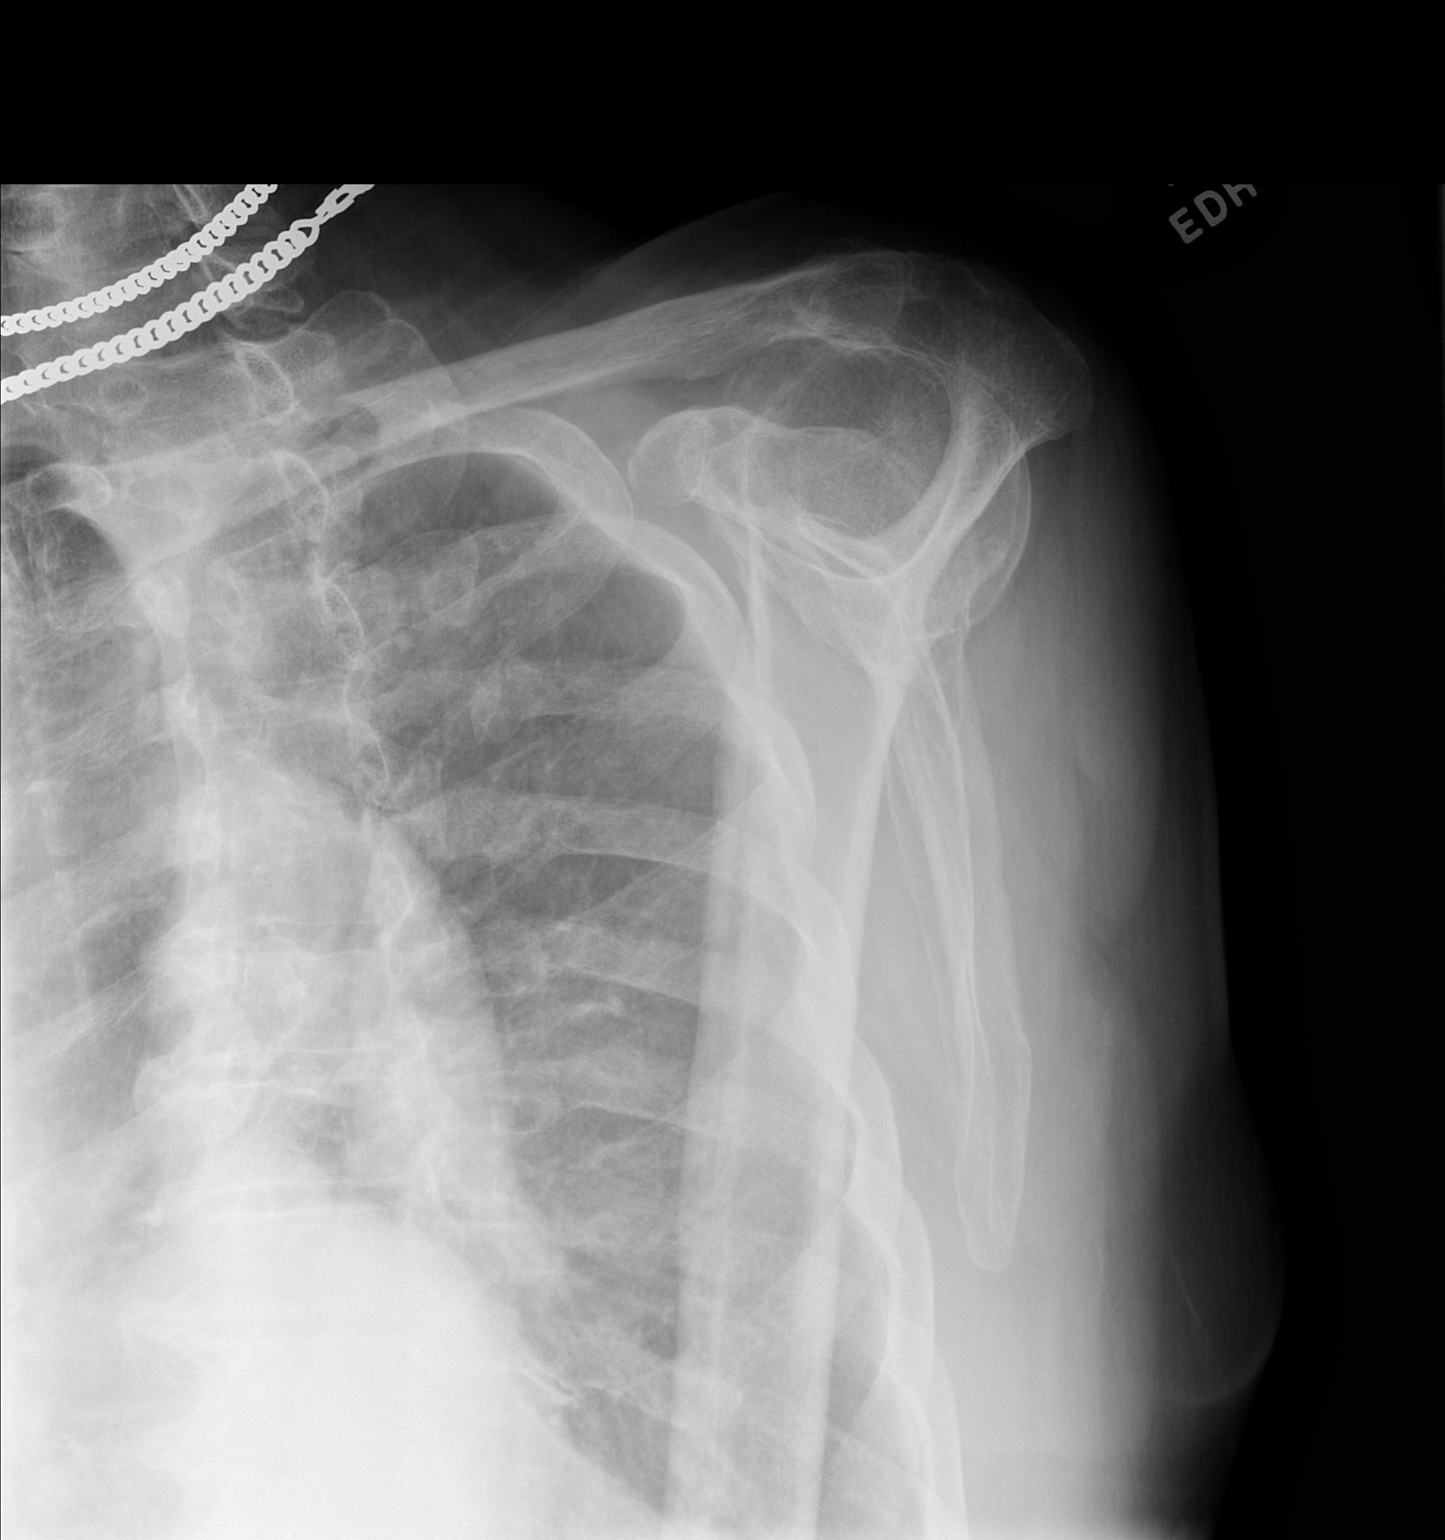

[3 of 3 positions shown; findings below may reference images not displayed]

FINDINGS: There are mild degenerative changes of the AC joint and glenohumeral
joint. There is no acute displaced fracture or dislocation. The
osseous mineralization is decreased.
IMPRESSION: No acute displaced fracture or dislocation.

## 2020-12-15 ENCOUNTER — Other Ambulatory Visit: Payer: Self-pay | Admitting: Interventional Cardiology

## 2020-12-17 ENCOUNTER — Encounter: Payer: Self-pay | Admitting: Neurology

## 2020-12-24 DIAGNOSIS — M25522 Pain in left elbow: Secondary | ICD-10-CM | POA: Diagnosis not present

## 2020-12-24 DIAGNOSIS — R2681 Unsteadiness on feet: Secondary | ICD-10-CM | POA: Diagnosis not present

## 2020-12-24 DIAGNOSIS — M25512 Pain in left shoulder: Secondary | ICD-10-CM | POA: Diagnosis not present

## 2020-12-30 DIAGNOSIS — J449 Chronic obstructive pulmonary disease, unspecified: Secondary | ICD-10-CM | POA: Diagnosis not present

## 2020-12-30 DIAGNOSIS — E1169 Type 2 diabetes mellitus with other specified complication: Secondary | ICD-10-CM | POA: Diagnosis not present

## 2020-12-30 DIAGNOSIS — N1832 Chronic kidney disease, stage 3b: Secondary | ICD-10-CM | POA: Diagnosis not present

## 2020-12-30 DIAGNOSIS — S43402S Unspecified sprain of left shoulder joint, sequela: Secondary | ICD-10-CM | POA: Diagnosis not present

## 2020-12-30 DIAGNOSIS — W19XXXS Unspecified fall, sequela: Secondary | ICD-10-CM | POA: Diagnosis not present

## 2020-12-30 DIAGNOSIS — Z7984 Long term (current) use of oral hypoglycemic drugs: Secondary | ICD-10-CM | POA: Diagnosis not present

## 2021-01-01 NOTE — Progress Notes (Signed)
NEUROLOGY CONSULTATION NOTE  CARLOS HEBER MRN: [id number] DOB: 1941-05-13  Referring provider: Lorenda Ishihara, MD Primary care provider: Lorenda Ishihara, MD  Reason for consult:  Falls, memory loss   Subjective:  Steven Newman is an 80 year old right-handed male with CAD, COPD, HTN, HLD, CKD, type 2 diabetes mellitus and history of bladder cancer who presents for falls and memory deficits.  Patient accompanied by his wife who supplements history.  For at least a year, his wife has noticed memory problems.  He will sometimes not be aware of things.  For example, if his wife leaves a letter on the counter to be mailed out, he may not place it in the mailbox.  If his wife tells him there is food in the refrigerator, he forgets it is there.  He sometimes forgets the day of the week but he attributes that to being retired and not needing to keep track of the day.  Sometimes he forgets how to get to certain doctor's offices.  However, he says it is because he has many doctors and has to stop and think about where they are located.  He doesn't have trouble driving to his PCP.  He doesn't typically become disoriented driving on familiar routes.  His parents didn't have memory deficits but hey both passed away at age 21.  Also, for about a year, his wife has noticed unsteady gait.  He seems to wobble from side to side when he walks.  He had a fall 3 weeks ago, in which he tripped on the steps.  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  He also has diabetes that has worsened over the past year, requiring medication.  Last Hgb A1c was 7.     PAST MEDICAL HISTORY: Past Medical History:  Diagnosis Date  . CAD (coronary artery disease)    coronary angioplasty 1995  . Dyspnea   . Erectile dysfunction   . History of bladder cancer   . Hx of colonic polyps   . Hyperlipidemia   . Hypertension   . Kidney stones   . MI (myocardial infarction) (HCC)    1976, 1985  .  Obesity   . Pneumonia   . Skin cancer   . Type 2 diabetes mellitus (HCC)   . Wheezing     PAST SURGICAL HISTORY: Past Surgical History:  Procedure Laterality Date  . bladder cancer surgery    . 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 neck tumor      MEDICATIONS: Current Outpatient Medications on File Prior to Visit  Medication Sig Dispense Refill  . albuterol (PROVENTIL) (2.5 MG/3ML) 0.083% nebulizer solution Take 2.5 mg by nebulization as needed for wheezing or shortness of breath.    . ASPIRIN LOW DOSE 81 MG EC tablet TAKE 1 TABLET DAILY 90 tablet 3  . atorvastatin (LIPITOR) 40 MG tablet TAKE 1 TABLET BY MOUTH DAILY. 90 tablet 3  . Boswellia-Glucosamine-Vit D (OSTEO BI-FLEX ONE PER DAY PO) Take 2 tablets by mouth daily.    . Coenzyme Q10 (CO Q-10) 100 MG CAPS Take 1 capsule by mouth daily.    . ergocalciferol (VITAMIN D2) 1.25 MG (50000 UT) capsule Take 50,000 Units by mouth once a week. Currently taking twice a week 01/01/20    . JARDIANCE 25 MG TABS tablet Take 25 mg by mouth daily.    . nadolol (CORGARD) 40 MG tablet Take 1 tablet (40 mg total) by mouth daily. 90  tablet 3  . nitroGLYCERIN (NITROSTAT) 0.4 MG SL tablet Place 1 tablet (0.4 mg total) under the tongue every 5 (five) minutes as needed for chest pain. 25 tablet 3  . Omega-3 Fatty Acids (RA FISH OIL) 1400 MG CPDR Take 1 capsule by mouth daily.     Marland Kitchen PRESCRIPTION MEDICATION Take 1 tablet by mouth daily. Triflex for arthritis as needed    . TURMERIC PO Take 1,000 mg daily by mouth.    . umeclidinium-vilanterol (ANORO ELLIPTA) 62.5-25 MCG/INH AEPB Inhale 1 puff into the lungs daily. 60 each 5  . UNABLE TO FIND Take 5 drops by mouth 2 (two) times daily. Med Name: [employer] CBD Oil     No current facility-administered medications on file prior to visit.    ALLERGIES: Allergies  Allergen Reactions  . Ace Inhibitors     Angioedema   . Penicillins Swelling and Rash    Lips swell    FAMILY HISTORY: Family History   Problem Relation Age of Onset  . Heart disease Mother   . Rheumatic fever Father   . Rheumatic fever Brother   . Diabetes Sister   . Cancer Sister        cancer on eyelid    SOCIAL HISTORY: Social History   Socioeconomic History  . Marital status: Married    Spouse name: Not on file  . Number of children: Not on file  . Years of education: Not on file  . Highest education level: Not on file  Occupational History  . Occupation: Retired  Tobacco Use  . Smoking status: Former Smoker    Packs/day: 2.00    Years: 50.00    Pack years: 100.00    Types: Cigarettes    Quit date: 06/20/2015    Years since quitting: 5.5  . Smokeless tobacco: Never Used  Substance and Sexual Activity  . Alcohol use: Yes    Alcohol/week: 0.0 standard drinks    Comment: socially  . Drug use: No  . Sexual activity: Not on file  Other Topics Concern  . Not on file  Social History Narrative  . Not on file   Social Determinants of Health   Financial Resource Strain: Not on file  Food Insecurity: Not on file  Transportation Needs: Not on file  Physical Activity: Not on file  Stress: Not on file  Social Connections: Not on file  Intimate Partner Violence: Not on file    Objective:  Blood pressure 132/77, pulse 63, height 5\' 9"  (1.753 m), weight 213 lb (96.6 kg), SpO2 95 %. General: No acute distress.  Patient appears well-groomed.   Head:  Normocephalic/atraumatic Eyes:  fundi examined but not visualized Neck: supple, no paraspinal tenderness, full range of motion Back: No paraspinal tenderness Heart: regular rate and rhythm Lungs: Clear to auscultation bilaterally. Vascular: No carotid bruits. Neurological Exam: Mental status:  [employer] Mental Exam 01/02/2021  Weekday Correct 1  Current year 1  What state are we in? 1  Amount spent 1  Amount left 2  # of Animals 2  5 objects recall 0  Number series 1  Hour markers 2  Time correct 0  Placed X in triangle correctly 1   Largest Figure 1  Name of male 2  Date back to work 0  Type of work 2  State she lived in 2  Total score 19   Cranial nerves: CN I: not tested CN II: pupils equal, round and reactive to light, visual fields intact  CN III, IV, VI:  full range of motion, no nystagmus, no ptosis CN V: facial sensation intact. CN VII: upper and lower face symmetric CN VIII: hearing intact CN IX, X: gag intact, uvula midline CN XI: sternocleidomastoid and trapezius muscles intact CN XII: tongue midline Bulk & Tone: normal, no fasciculations. Motor:  muscle strength 5/5 throughout Sensation:  Pinprick sensation intact, vibratory sensation reduced in feet Deep Tendon Reflexes:  2+ throughout,  toes downgoing.   Finger to nose testing:  Without dysmetria.   Heel to shin:  Without dysmetria.   Gait:  Wobbling gait.  Able to turn.  Unable to tandem walk.  Romberg negative.  Assessment/Plan:   1.  Memory deficits 2.  Unsteady gait - may be secondary to osteoarthritis and underlying diabetic polyneuropathy  1.  MRI of brain 2.  Neuropsychological testing 3.  Check b12 and TSH. 4.  Follow up after testing    Thank you for allowing me to take part in the care of this patient.  Metta Clines, DO  CC:  Leeroy Cha, MD

## 2021-01-02 ENCOUNTER — Other Ambulatory Visit: Payer: Self-pay

## 2021-01-02 ENCOUNTER — Other Ambulatory Visit: Payer: Medicare Other

## 2021-01-02 ENCOUNTER — Ambulatory Visit (INDEPENDENT_AMBULATORY_CARE_PROVIDER_SITE_OTHER): Payer: Medicare Other | Admitting: Neurology

## 2021-01-02 ENCOUNTER — Encounter: Payer: Self-pay | Admitting: Neurology

## 2021-01-02 VITALS — BP 132/77 | HR 63 | Ht 69.0 in | Wt 213.0 lb

## 2021-01-02 DIAGNOSIS — M150 Primary generalized (osteo)arthritis: Secondary | ICD-10-CM | POA: Diagnosis not present

## 2021-01-02 DIAGNOSIS — E1169 Type 2 diabetes mellitus with other specified complication: Secondary | ICD-10-CM | POA: Diagnosis not present

## 2021-01-02 DIAGNOSIS — N183 Chronic kidney disease, stage 3 unspecified: Secondary | ICD-10-CM | POA: Diagnosis not present

## 2021-01-02 DIAGNOSIS — R2681 Unsteadiness on feet: Secondary | ICD-10-CM

## 2021-01-02 DIAGNOSIS — I252 Old myocardial infarction: Secondary | ICD-10-CM | POA: Diagnosis not present

## 2021-01-02 DIAGNOSIS — I10 Essential (primary) hypertension: Secondary | ICD-10-CM | POA: Diagnosis not present

## 2021-01-02 DIAGNOSIS — I2510 Atherosclerotic heart disease of native coronary artery without angina pectoris: Secondary | ICD-10-CM | POA: Diagnosis not present

## 2021-01-02 DIAGNOSIS — E7800 Pure hypercholesterolemia, unspecified: Secondary | ICD-10-CM | POA: Diagnosis not present

## 2021-01-02 DIAGNOSIS — R413 Other amnesia: Secondary | ICD-10-CM

## 2021-01-02 DIAGNOSIS — J449 Chronic obstructive pulmonary disease, unspecified: Secondary | ICD-10-CM | POA: Diagnosis not present

## 2021-01-02 DIAGNOSIS — M25522 Pain in left elbow: Secondary | ICD-10-CM | POA: Diagnosis not present

## 2021-01-02 DIAGNOSIS — M25512 Pain in left shoulder: Secondary | ICD-10-CM | POA: Diagnosis not present

## 2021-01-02 LAB — TSH: TSH: 3.11 u[IU]/mL (ref 0.35–4.50)

## 2021-01-02 LAB — VITAMIN B12: Vitamin B-12: 377 pg/mL (ref 211–911)

## 2021-01-02 NOTE — Patient Instructions (Addendum)
1.  MRI of brain without contrast. We have sent a referral to [facility] for your MRI and they will call you directly to schedule your appointment. They are located at [address]. If you need to contact them directly please call [phone].  2.  B12, TSH. Your provider has requested that you have labwork completed today. Please go to [facility] ([id number]) on the second floor of this building before leaving the office today. You do not need to check in. If you are not called within 15 minutes please check with the front desk.  3.  Neurocognitive testing 4.  Follow up after testing

## 2021-01-06 DIAGNOSIS — M25522 Pain in left elbow: Secondary | ICD-10-CM | POA: Diagnosis not present

## 2021-01-06 DIAGNOSIS — M25512 Pain in left shoulder: Secondary | ICD-10-CM | POA: Diagnosis not present

## 2021-01-06 DIAGNOSIS — R2681 Unsteadiness on feet: Secondary | ICD-10-CM | POA: Diagnosis not present

## 2021-01-13 DIAGNOSIS — M25512 Pain in left shoulder: Secondary | ICD-10-CM | POA: Diagnosis not present

## 2021-01-13 DIAGNOSIS — M25522 Pain in left elbow: Secondary | ICD-10-CM | POA: Diagnosis not present

## 2021-01-13 DIAGNOSIS — R2681 Unsteadiness on feet: Secondary | ICD-10-CM | POA: Diagnosis not present

## 2021-01-14 DIAGNOSIS — M25512 Pain in left shoulder: Secondary | ICD-10-CM | POA: Diagnosis not present

## 2021-01-14 DIAGNOSIS — M25522 Pain in left elbow: Secondary | ICD-10-CM | POA: Diagnosis not present

## 2021-01-14 HISTORY — DX: Pain in left shoulder: M25.512

## 2021-01-15 DIAGNOSIS — E785 Hyperlipidemia, unspecified: Secondary | ICD-10-CM | POA: Diagnosis not present

## 2021-01-15 DIAGNOSIS — N1832 Chronic kidney disease, stage 3b: Secondary | ICD-10-CM | POA: Diagnosis not present

## 2021-01-15 DIAGNOSIS — D509 Iron deficiency anemia, unspecified: Secondary | ICD-10-CM | POA: Diagnosis not present

## 2021-01-15 DIAGNOSIS — I129 Hypertensive chronic kidney disease with stage 1 through stage 4 chronic kidney disease, or unspecified chronic kidney disease: Secondary | ICD-10-CM | POA: Diagnosis not present

## 2021-01-15 DIAGNOSIS — E1122 Type 2 diabetes mellitus with diabetic chronic kidney disease: Secondary | ICD-10-CM | POA: Diagnosis not present

## 2021-01-15 DIAGNOSIS — I252 Old myocardial infarction: Secondary | ICD-10-CM | POA: Diagnosis not present

## 2021-01-15 DIAGNOSIS — J449 Chronic obstructive pulmonary disease, unspecified: Secondary | ICD-10-CM | POA: Diagnosis not present

## 2021-01-15 DIAGNOSIS — I2510 Atherosclerotic heart disease of native coronary artery without angina pectoris: Secondary | ICD-10-CM | POA: Diagnosis not present

## 2021-01-16 ENCOUNTER — Other Ambulatory Visit (HOSPITAL_BASED_OUTPATIENT_CLINIC_OR_DEPARTMENT_OTHER): Payer: Self-pay | Admitting: Internal Medicine

## 2021-01-16 DIAGNOSIS — N1832 Chronic kidney disease, stage 3b: Secondary | ICD-10-CM

## 2021-01-21 ENCOUNTER — Other Ambulatory Visit: Payer: Self-pay

## 2021-01-21 ENCOUNTER — Other Ambulatory Visit (HOSPITAL_COMMUNITY): Payer: Self-pay | Admitting: Cardiovascular Disease

## 2021-01-21 ENCOUNTER — Encounter (HOSPITAL_COMMUNITY): Payer: Medicare Other

## 2021-01-21 ENCOUNTER — Ambulatory Visit (HOSPITAL_BASED_OUTPATIENT_CLINIC_OR_DEPARTMENT_OTHER)
Admission: RE | Admit: 2021-01-21 | Discharge: 2021-01-21 | Disposition: A | Discharge status: Home / Self Care | Payer: Medicare Other | Source: Ambulatory Visit | Attending: Cardiovascular Disease | Admitting: Cardiovascular Disease

## 2021-01-21 ENCOUNTER — Ambulatory Visit (HOSPITAL_COMMUNITY)
Admission: RE | Admit: 2021-01-21 | Discharge: 2021-01-21 | Disposition: A | Discharge status: Home / Self Care | Payer: Medicare Other | Source: Ambulatory Visit | Attending: Cardiovascular Disease | Admitting: Cardiovascular Disease

## 2021-01-21 DIAGNOSIS — I77811 Abdominal aortic ectasia: Secondary | ICD-10-CM

## 2021-01-21 DIAGNOSIS — I739 Peripheral vascular disease, unspecified: Secondary | ICD-10-CM | POA: Diagnosis not present

## 2021-01-21 DIAGNOSIS — I714 Abdominal aortic aneurysm, without rupture, unspecified: Secondary | ICD-10-CM

## 2021-01-21 DIAGNOSIS — I723 Aneurysm of iliac artery: Secondary | ICD-10-CM

## 2021-01-23 DIAGNOSIS — M25512 Pain in left shoulder: Secondary | ICD-10-CM | POA: Diagnosis not present

## 2021-01-23 DIAGNOSIS — R2681 Unsteadiness on feet: Secondary | ICD-10-CM | POA: Diagnosis not present

## 2021-01-23 DIAGNOSIS — M25522 Pain in left elbow: Secondary | ICD-10-CM | POA: Diagnosis not present

## 2021-01-24 ENCOUNTER — Other Ambulatory Visit: Payer: Self-pay

## 2021-01-24 ENCOUNTER — Ambulatory Visit
Admission: RE | Admit: 2021-01-24 | Discharge: 2021-01-24 | Disposition: A | Discharge status: Home / Self Care | Payer: Medicare Other | Source: Ambulatory Visit | Attending: Neurology | Admitting: Neurology

## 2021-01-24 DIAGNOSIS — R2681 Unsteadiness on feet: Secondary | ICD-10-CM

## 2021-01-24 DIAGNOSIS — R413 Other amnesia: Secondary | ICD-10-CM

## 2021-01-24 IMAGING — MR MR HEAD W/O CM
10 series · 48 of 48 positions shown · non-contrast
Comparison: None.

CLINICAL DATA: Memory loss.  Disorientation.

EXAM:
MRI HEAD WITHOUT CONTRAST
TECHNIQUE: Multiplanar, multiecho pulse sequences of the brain and surrounding
structures were obtained without intravenous contrast.

[Series 2: T1 · sagittal · 5.0mm · 0.47mm/px · 2 of 23 slices shown]
[im 1/23]
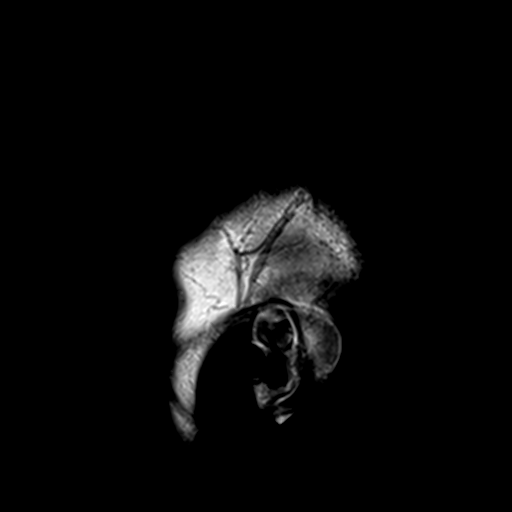
[im 23/23]
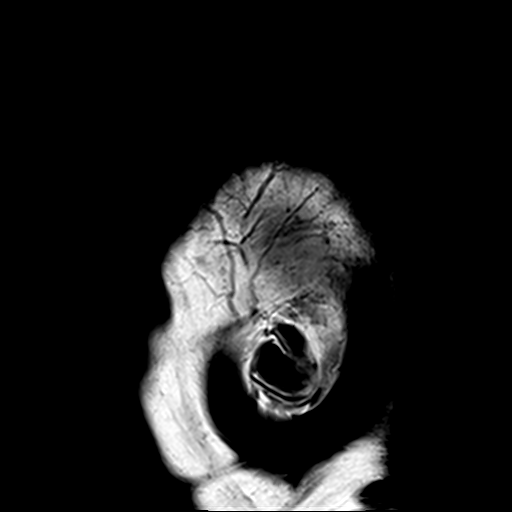

[Series 3: DWI · axial · 3.0mm · 1.88mm/px · z∈[-52,+103]mm · 9 of 107 slices shown (1 of 4)]
[im 1/107]
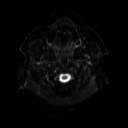
[im 14/107]
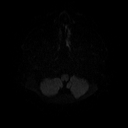
[im 27/107]
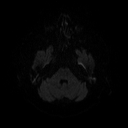
[im 40/107]
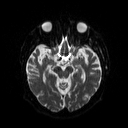
[im 54/107]
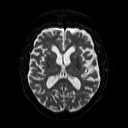
[im 67/107]
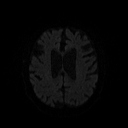
[im 80/107]
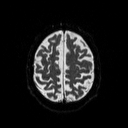
[im 93/107]
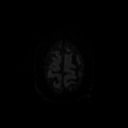
[im 107/107]
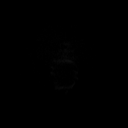

[Series 4: DWI · axial · 3.0mm · 1.88mm/px · z∈[-52,+103]mm · 5 of 54 slices shown (2 of 4)]
[im 1/54]
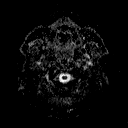
[im 14/54]
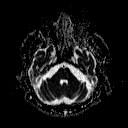
[im 27/54]
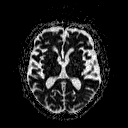
[im 40/54]
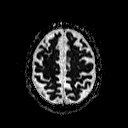
[im 54/54]
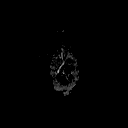

[Series 5: DWI · coronal · 5.0mm · 1.80mm/px · 6 of 70 slices shown (3 of 4)]
[im 1/70]
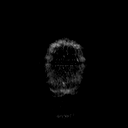
[im 14/70]
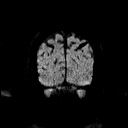
[im 28/70]
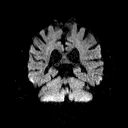
[im 42/70]
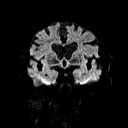
[im 56/70]
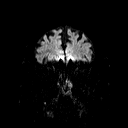
[im 70/70]
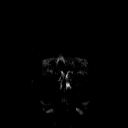

[Series 6: DWI · coronal · 5.0mm · 1.80mm/px · 3 of 35 slices shown (4 of 4)]
[im 1/35]
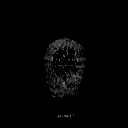
[im 18/35]
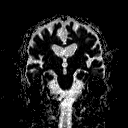
[im 35/35]
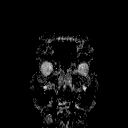

[Series 7: T2 · axial · 5.0mm · 0.94mm/px · z∈[-48,+108]mm · 2 of 24 slices shown (1 of 2)]
[im 1/24]
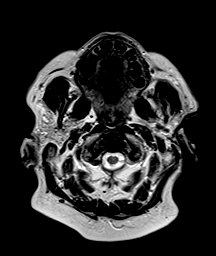
[im 24/24]
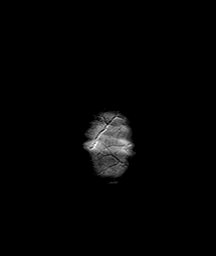

[Series 8: FLAIR · axial · 3.0mm · 0.47mm/px · z∈[-39,+108]mm · 3 of 34 slices shown]
[im 1/34]
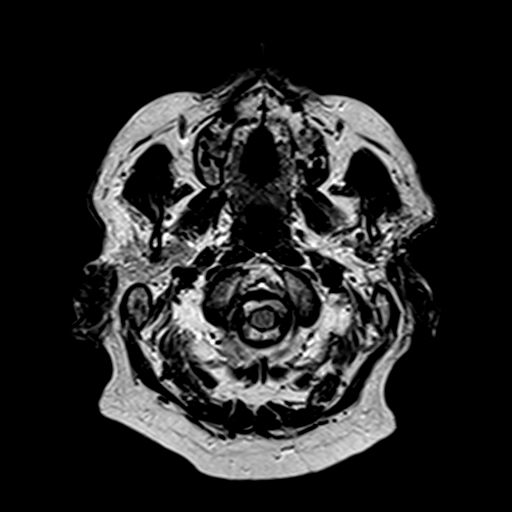
[im 17/34]
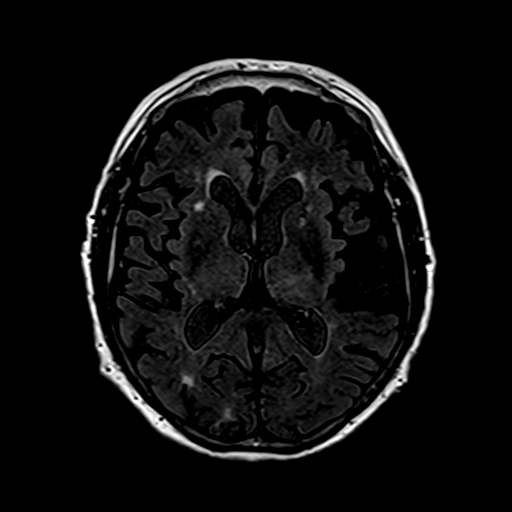
[im 34/34]
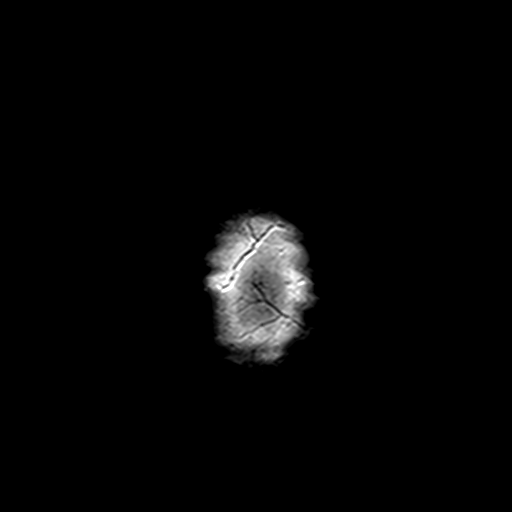

[Series 10: swi_images · axial · 4.0mm · 0.90mm/px · z∈[-50,+101]mm · 3 of 40 slices shown]
[im 1/40]
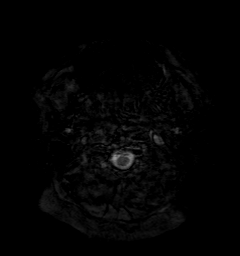
[im 20/40]
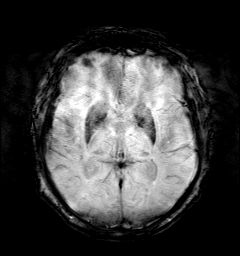
[im 40/40]
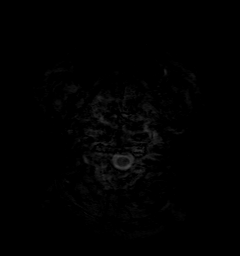

[Series 11: t1_mpr_tra · axial · 1.0mm · 0.89mm/px · z∈[-49,+105]mm · 13 of 160 slices shown]
[im 1/160]
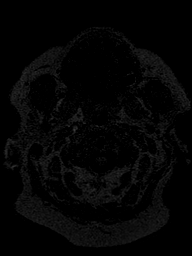
[im 14/160]
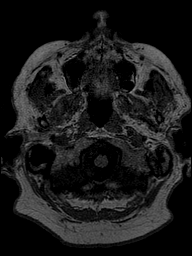
[im 27/160]
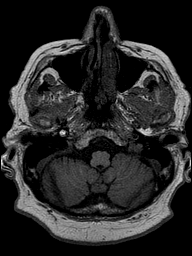
[im 40/160]
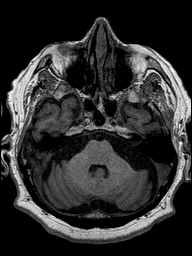
[im 54/160]
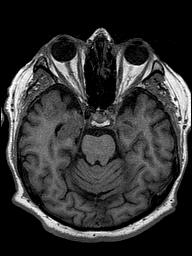
[im 67/160]
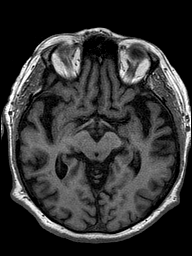
[im 80/160]
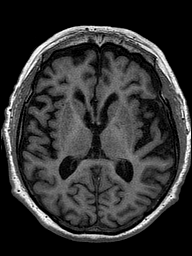
[im 93/160]
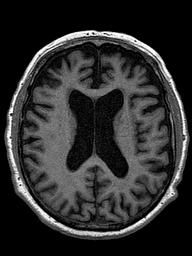
[im 107/160]
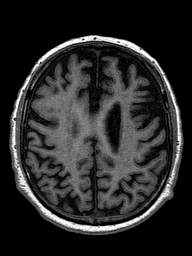
[im 120/160]
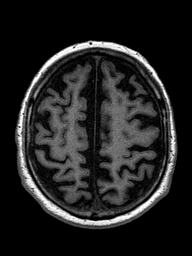
[im 133/160]
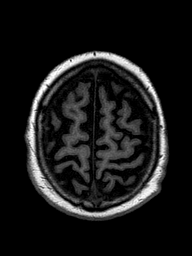
[im 146/160]
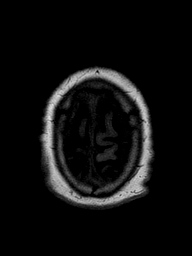
[im 160/160]
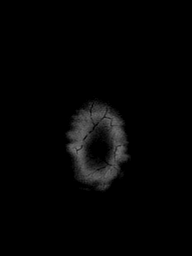

[Series 12: T2 · coronal · 5.0mm · 0.45mm/px · 2 of 28 slices shown (2 of 2)]
[im 1/28]
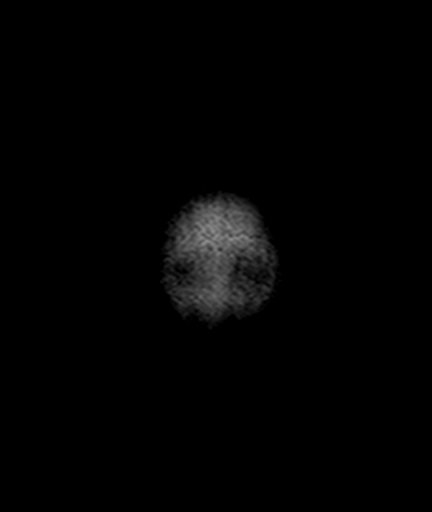
[im 28/28]
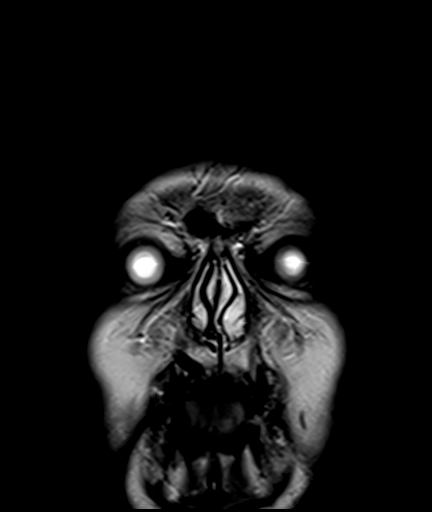

[48 of 48 positions shown; findings below may reference images not displayed]

FINDINGS: Brain: Diffusion imaging does not show any acute or subacute
infarction. Age related volume loss without definite lobar
predominance. Mild chronic small-vessel change of the pons. No focal
cerebellar finding. Cerebral hemispheres show mild to moderate
chronic small-vessel changes of the white matter. No cortical or
large vessel territory infarction. No mass lesion, hemorrhage,
hydrocephalus or extra-axial collection.

Vascular: Major vessels at the base of the brain show flow.

Skull and upper cervical spine: Normal

Sinuses/Orbits: Clear/normal

Other: None
IMPRESSION: No acute or reversible finding. Age related volume loss without
definite lobar predominance. Mild to moderate chronic small-vessel
changes of the pons and cerebral hemispheric white matter.

## 2021-01-26 DIAGNOSIS — E7800 Pure hypercholesterolemia, unspecified: Secondary | ICD-10-CM | POA: Diagnosis not present

## 2021-01-26 DIAGNOSIS — J449 Chronic obstructive pulmonary disease, unspecified: Secondary | ICD-10-CM | POA: Diagnosis not present

## 2021-01-26 DIAGNOSIS — I10 Essential (primary) hypertension: Secondary | ICD-10-CM | POA: Diagnosis not present

## 2021-01-26 DIAGNOSIS — N183 Chronic kidney disease, stage 3 unspecified: Secondary | ICD-10-CM | POA: Diagnosis not present

## 2021-01-26 DIAGNOSIS — I2510 Atherosclerotic heart disease of native coronary artery without angina pectoris: Secondary | ICD-10-CM | POA: Diagnosis not present

## 2021-01-26 DIAGNOSIS — I252 Old myocardial infarction: Secondary | ICD-10-CM | POA: Diagnosis not present

## 2021-01-26 DIAGNOSIS — M150 Primary generalized (osteo)arthritis: Secondary | ICD-10-CM | POA: Diagnosis not present

## 2021-01-26 DIAGNOSIS — E1169 Type 2 diabetes mellitus with other specified complication: Secondary | ICD-10-CM | POA: Diagnosis not present

## 2021-01-27 ENCOUNTER — Other Ambulatory Visit (HOSPITAL_COMMUNITY): Payer: Self-pay | Admitting: Cardiovascular Disease

## 2021-01-27 ENCOUNTER — Ambulatory Visit (HOSPITAL_COMMUNITY)
Admission: RE | Admit: 2021-01-27 | Discharge: 2021-01-27 | Disposition: A | Discharge status: Home / Self Care | Payer: Medicare Other | Source: Ambulatory Visit | Attending: Cardiovascular Disease | Admitting: Cardiovascular Disease

## 2021-01-27 ENCOUNTER — Other Ambulatory Visit: Payer: Self-pay

## 2021-01-27 DIAGNOSIS — I739 Peripheral vascular disease, unspecified: Secondary | ICD-10-CM | POA: Diagnosis not present

## 2021-01-30 DIAGNOSIS — M25512 Pain in left shoulder: Secondary | ICD-10-CM | POA: Diagnosis not present

## 2021-01-30 DIAGNOSIS — R2681 Unsteadiness on feet: Secondary | ICD-10-CM | POA: Diagnosis not present

## 2021-01-30 DIAGNOSIS — M25522 Pain in left elbow: Secondary | ICD-10-CM | POA: Diagnosis not present

## 2021-02-02 ENCOUNTER — Ambulatory Visit
Admission: RE | Admit: 2021-02-02 | Discharge: 2021-02-02 | Disposition: A | Discharge status: Home / Self Care | Payer: Medicare Other | Source: Ambulatory Visit | Attending: Internal Medicine | Admitting: Internal Medicine

## 2021-02-02 DIAGNOSIS — N400 Enlarged prostate without lower urinary tract symptoms: Secondary | ICD-10-CM | POA: Diagnosis not present

## 2021-02-02 DIAGNOSIS — N281 Cyst of kidney, acquired: Secondary | ICD-10-CM | POA: Diagnosis not present

## 2021-02-02 DIAGNOSIS — N1832 Chronic kidney disease, stage 3b: Secondary | ICD-10-CM

## 2021-02-02 DIAGNOSIS — N189 Chronic kidney disease, unspecified: Secondary | ICD-10-CM | POA: Diagnosis not present

## 2021-02-02 IMAGING — US US RENAL
1 series · 13 of 25 positions shown · non-contrast
Comparison: None available.

CLINICAL DATA: Initial evaluation for chronic kidney disease.

EXAM:
RENAL / URINARY TRACT ULTRASOUND COMPLETE

[Series 1: us renal · 0.23mm/px · 13 of 48 slices shown]
[im 1/48]
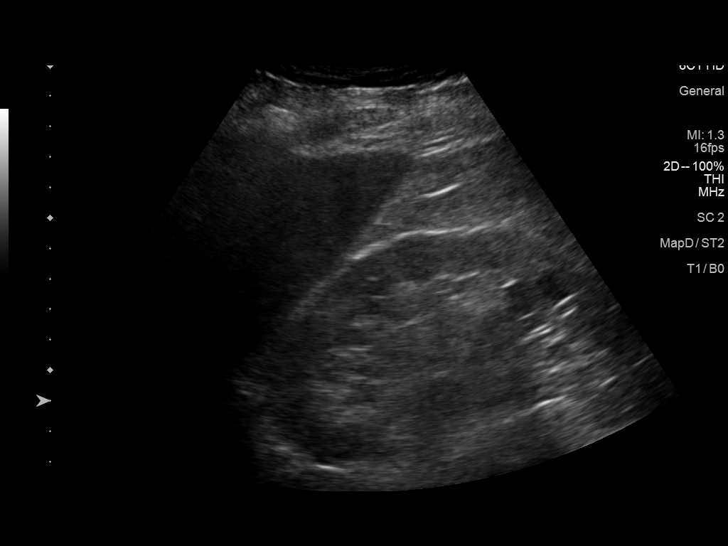
[im 4/48]
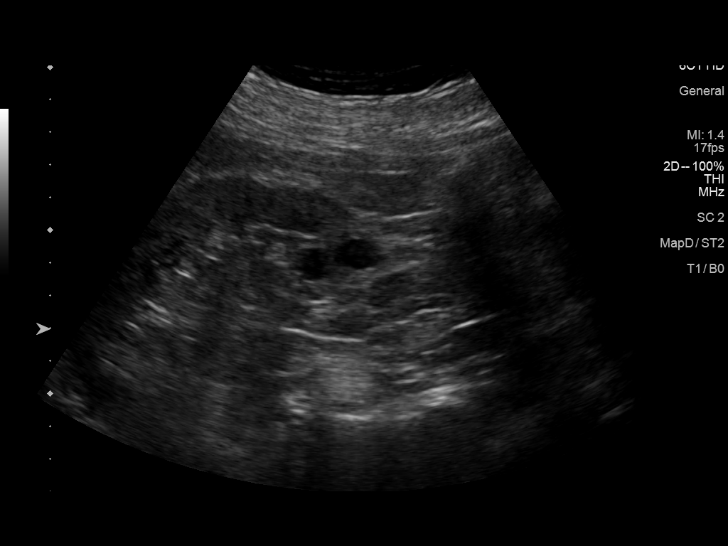
[im 8/48]
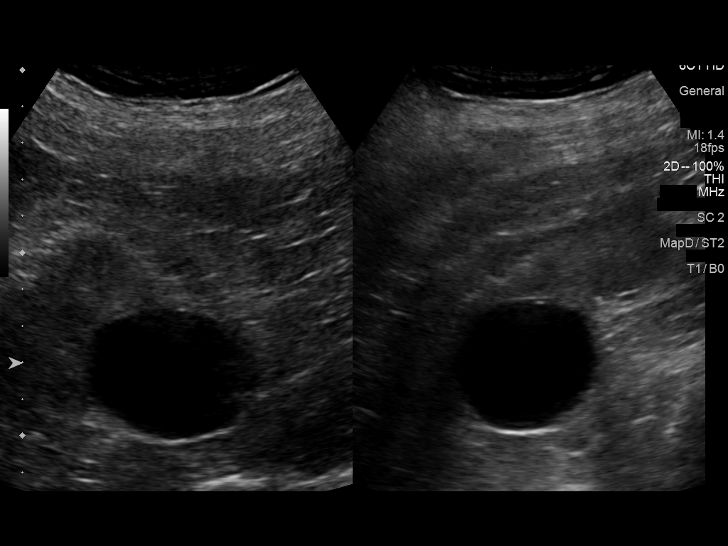
[im 12/48]
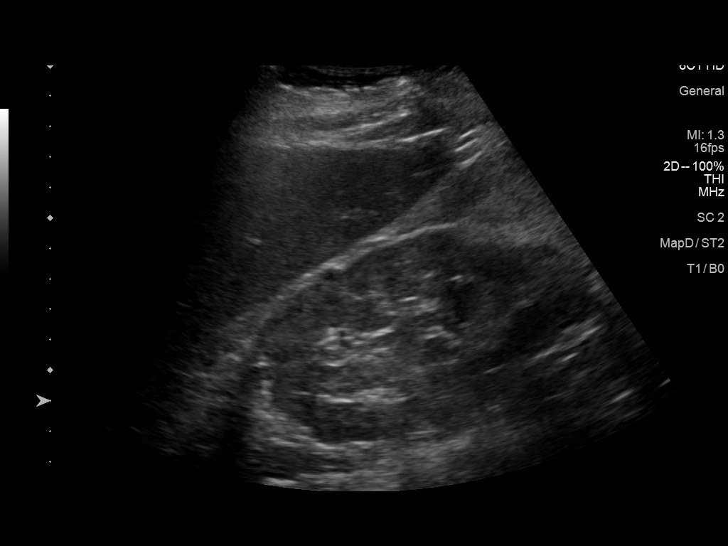
[im 16/48]
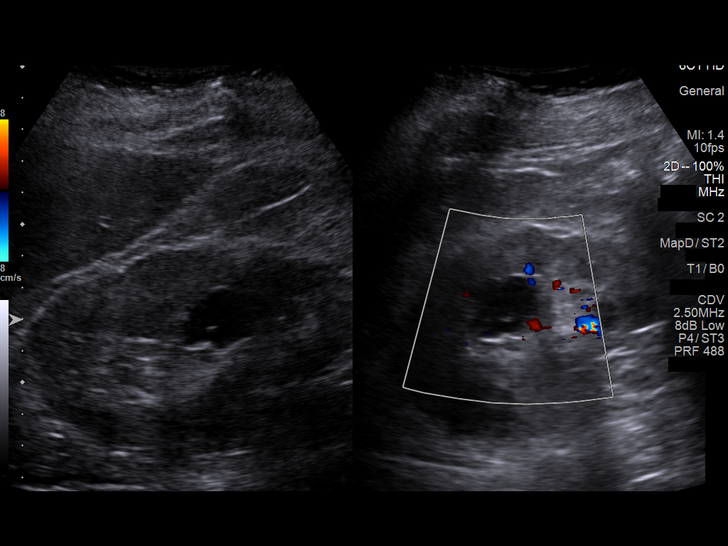
[im 20/48]
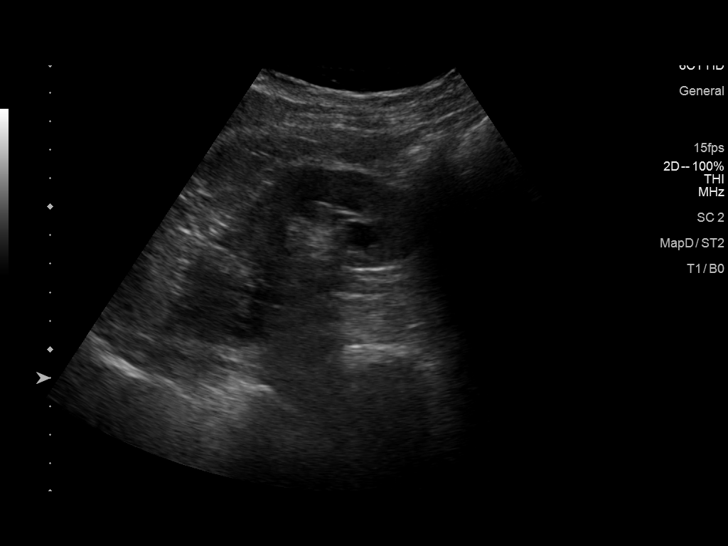
[im 24/48]
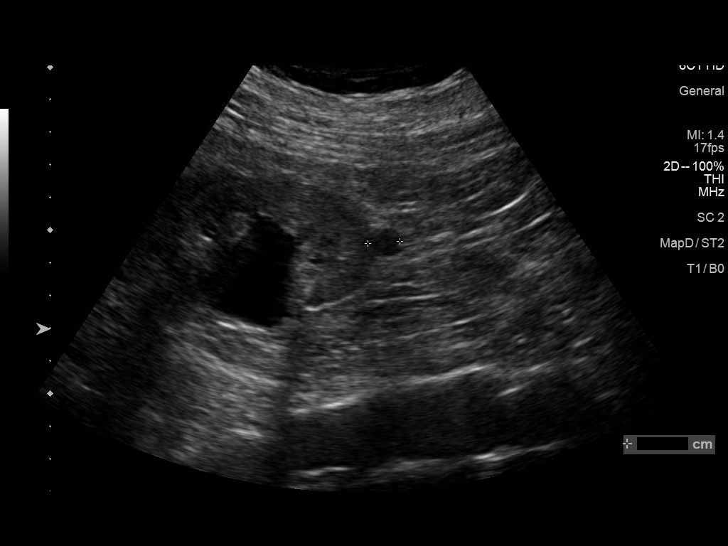
[im 28/48]
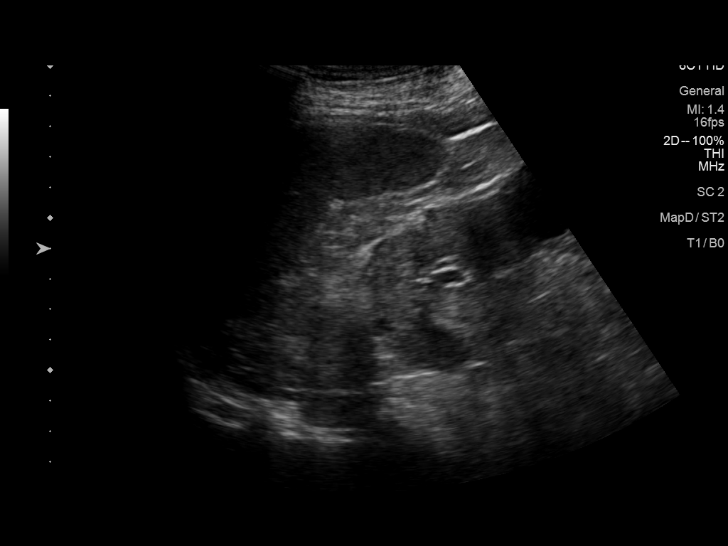
[im 32/48]
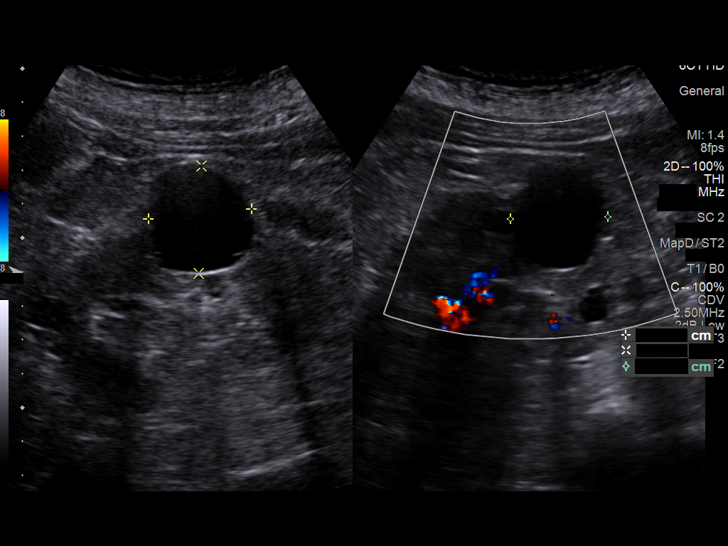
[im 36/48]
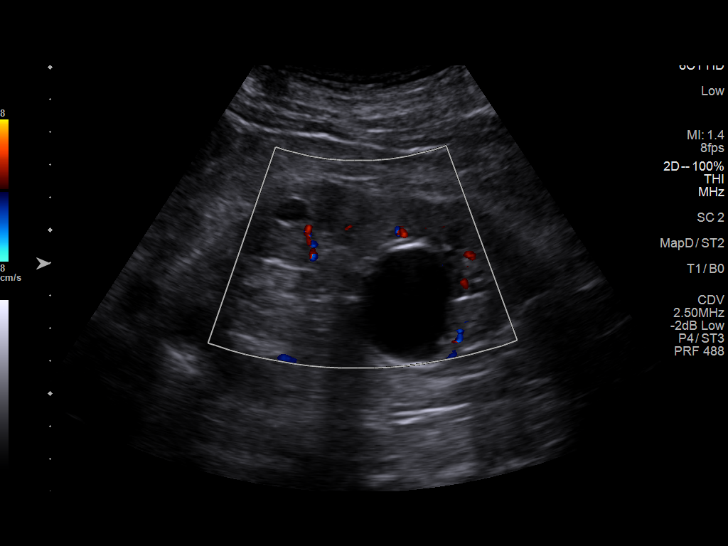
[im 40/48]
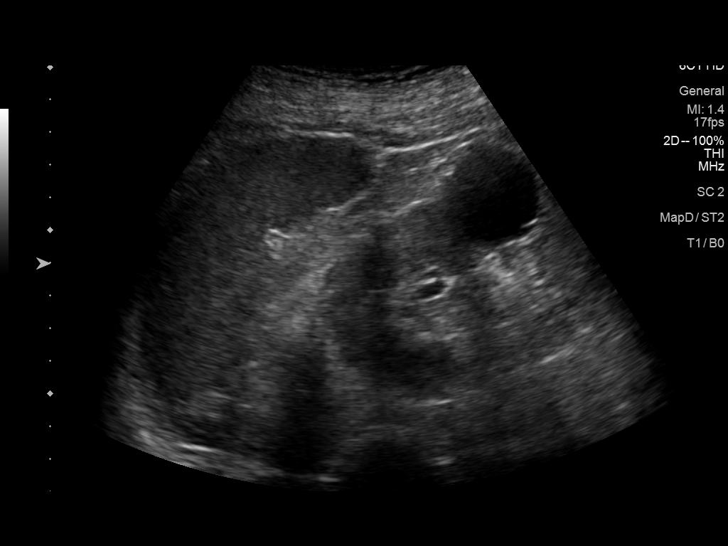
[im 44/48]
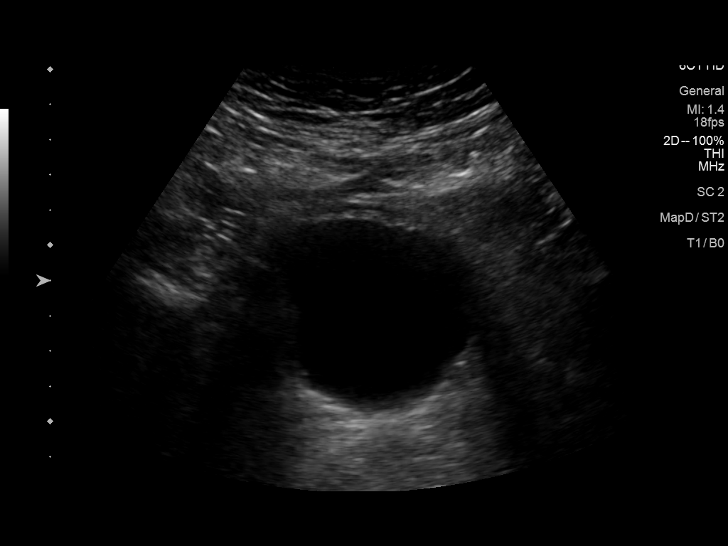
[im 48/48]
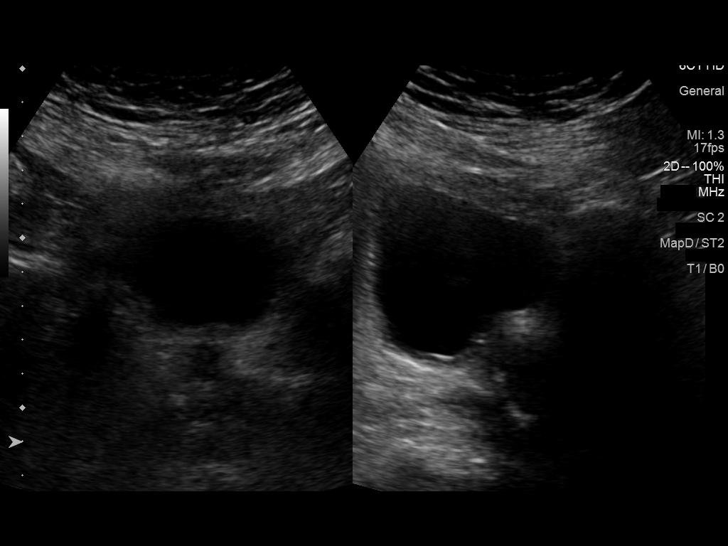

[13 of 25 positions shown; findings below may reference images not displayed]

FINDINGS: Right Kidney:

Renal measurements: 11.1 x 5.2 x 6.6 cm = volume: 201 mL. Increased
echogenicity within the renal parenchyma. No nephrolithiasis or
hydronephrosis. Multiple scattered benign-appearing cysts are seen
within the right kidney, 2 largest of which are measured. 4.8 x
x 3.9 cm simple cyst present at the lower pole. 2.5 x 2.0 x 2.1 cm
benign appearing cyst seen at the interpolar region. Few scattered
low-level internal echoes without appreciable vascularity or solid
component.

Left Kidney:

Renal measurements: 11.0 x 6.3 x 6.5 cm = volume: 234 mL. Increased
echogenicity within the renal parenchyma. No nephrolithiasis or
hydronephrosis. Multiple benign appearing cysts are seen, 2 largest
of which are measured. 2.8 x 3.3 x 3.0 cm simple cyst present at the
lower pole. 3.1 x 3.2 x 3.0 cm simple cyst present at the interpolar
region.

Bladder:

Appears normal for degree of bladder distention.

Other:

Mildly enlarged prostate partially invaginates upon the base of the
bladder.
IMPRESSION: 1. Increased echogenicity within the renal parenchyma, compatible
with medical renal disease.
2. No hydronephrosis.
3. Multiple benign appearing bilateral renal cysts as above.

## 2021-02-03 DIAGNOSIS — M25522 Pain in left elbow: Secondary | ICD-10-CM | POA: Diagnosis not present

## 2021-02-03 DIAGNOSIS — M25512 Pain in left shoulder: Secondary | ICD-10-CM | POA: Diagnosis not present

## 2021-02-03 DIAGNOSIS — R2681 Unsteadiness on feet: Secondary | ICD-10-CM | POA: Diagnosis not present

## 2021-02-03 DIAGNOSIS — N1832 Chronic kidney disease, stage 3b: Secondary | ICD-10-CM | POA: Diagnosis not present

## 2021-02-10 ENCOUNTER — Ambulatory Visit (INDEPENDENT_AMBULATORY_CARE_PROVIDER_SITE_OTHER): Payer: Medicare Other | Admitting: Cardiovascular Disease

## 2021-02-10 ENCOUNTER — Other Ambulatory Visit: Payer: Self-pay

## 2021-02-10 ENCOUNTER — Encounter: Payer: Self-pay | Admitting: Cardiovascular Disease

## 2021-02-10 VITALS — BP 136/78 | HR 60 | Ht 68.0 in | Wt 211.8 lb

## 2021-02-10 DIAGNOSIS — I739 Peripheral vascular disease, unspecified: Secondary | ICD-10-CM

## 2021-02-10 DIAGNOSIS — I714 Abdominal aortic aneurysm, without rupture, unspecified: Secondary | ICD-10-CM

## 2021-02-10 DIAGNOSIS — E785 Hyperlipidemia, unspecified: Secondary | ICD-10-CM | POA: Diagnosis not present

## 2021-02-10 DIAGNOSIS — I2510 Atherosclerotic heart disease of native coronary artery without angina pectoris: Secondary | ICD-10-CM | POA: Diagnosis not present

## 2021-02-10 NOTE — Progress Notes (Signed)
Cardiology Office Note   Date:  02/10/2021   ID:  [name], DOB [birth year], MRN [id number]  PCP:  Leeroy Cha, MD  Cardiologist:  Dr. Irish Lack  No chief complaint on file.     History of Present Illness: Steven Newman is a 80 y.o. male who is here today for a follow-up visit regarding peripheral arterial disease.  He has known history of coronary artery disease status post MI and multiple PCI in the mid 90s. No recent revascularization. He is a previous smoker and quit in 2016. Other chronic medical conditions include hypertension, hyperlipidemia and obesity. 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  He has been treated medically due to lack of claudication.  He had repeat vascular studies in February which showed an ABI of 0.74 on the right and 0.93 on the left.  Duplex showed worsening stenosis in the right popliteal artery and left external iliac artery and common femoral arteries.  Aortic aneurysm was stable in size.  In spite of PAD progression, the patient still denies claudication.  He is limited by shortness of breath more than anything else.  No chest pain.  Past Medical History:  Diagnosis Date  . CAD (coronary artery disease)    coronary angioplasty 1995  . Dyspnea   . Erectile dysfunction   . History of bladder cancer   . Hx of colonic polyps   . Hyperlipidemia   . Hypertension   . Kidney stones   . MI (myocardial infarction) ([place])    1976, 1985  . Obesity   . Pneumonia   . Skin cancer   . Type 2 diabetes mellitus ([place])   . Wheezing     Past Surgical History:  Procedure Laterality Date  . bladder cancer surgery    . 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 neck tumor       Current Outpatient Medications  Medication Sig Dispense Refill  . albuterol (PROVENTIL) (2.5 MG/3ML) 0.083% nebulizer solution Take 2.5 mg by nebulization as needed for wheezing or shortness of breath.     . ASPIRIN LOW DOSE 81 MG EC tablet TAKE 1 TABLET DAILY 90 tablet 3  . atorvastatin (LIPITOR) 40 MG tablet TAKE 1 TABLET BY MOUTH DAILY. 90 tablet 3  . Boswellia-Glucosamine-Vit D (OSTEO BI-FLEX ONE PER DAY PO) Take 2 tablets by mouth daily.    . Coenzyme Q10 (CO Q-10) 100 MG CAPS Take 1 capsule by mouth daily.    . ergocalciferol (VITAMIN D2) 1.25 MG (50000 UT) capsule Take 50,000 Units by mouth once a week. Currently taking twice a week 01/01/20    . JARDIANCE 25 MG TABS tablet Take 25 mg by mouth daily.    . nadolol (CORGARD) 40 MG tablet Take 1 tablet (40 mg total) by mouth daily. 90 tablet 3  . nitroGLYCERIN (NITROSTAT) 0.4 MG SL tablet Place 1 tablet (0.4 mg total) under the tongue every 5 (five) minutes as needed for chest pain. 25 tablet 3  . Omega-3 Fatty Acids (RA FISH OIL) 1400 MG CPDR Take 1 capsule by mouth daily.     Marland Kitchen oxybutynin (DITROPAN) 5 MG tablet 1 tablet    . PRESCRIPTION MEDICATION Take 1 tablet by mouth daily. Triflex for arthritis as needed    . TURMERIC PO Take 1,000 mg daily by mouth.    . umeclidinium-vilanterol (ANORO ELLIPTA) 62.5-25 MCG/INH AEPB Inhale 1 puff into the lungs daily.  60 each 5  . UNABLE TO FIND Take 5 drops by mouth 2 (two) times daily. Med Name: [employer] CBD Oil     No current facility-administered medications for this visit.    Allergies:   Ace inhibitors and Penicillins    Social History:  The patient  reports that he quit smoking about 5 years ago. His smoking use included cigarettes. He has a 100.00 pack-year smoking history. He has never used smokeless tobacco. He reports current alcohol use. He reports that he does not use drugs.   Family History:  The patient's family history includes Cancer in his sister; Diabetes in his sister; Heart disease in his mother; Rheumatic fever in his brother and father.    ROS:  Please see the history of present illness.   Otherwise, review of systems are positive for none.   All other systems are reviewed  and negative.    PHYSICAL EXAM: VS:  BP 136/78   Pulse 60   Ht 5\' 8"  (1.727 m)   Wt 211 lb 12.8 oz (96.1 kg)   SpO2 90%   BMI 32.20 kg/m  , BMI Body mass index is 32.2 kg/m. GEN: Well nourished, well developed, in no acute distress  HEENT: normal  Neck: no JVD, carotid bruits, or masses Cardiac: RRR; no murmurs, rubs, or gallops,no edema  Respiratory:  clear to auscultation bilaterally, normal work of breathing GI: soft, nontender, nondistended, + BS MS: no deformity or atrophy  Skin: warm and dry, no rash Neuro:  Strength and sensation are intact Psych: euthymic mood, full affect Vascular: Femoral pulse: Slightly diminished bilaterally.   EKG:  EKG is not ordered today.    Recent Labs: 01/02/2021: TSH 3.11    Lipid Panel    Component Value Date/Time   CHOL 159 12/23/2014 0923   TRIG 129 12/23/2014 0923   HDL 45 12/23/2014 0923   LDLCALC 88 12/23/2014 0923      Wt Readings from Last 3 Encounters:  02/10/21 211 lb 12.8 oz (96.1 kg)  01/02/21 213 lb (96.6 kg)  11/13/20 213 lb 6.4 oz (96.8 kg)       PAD Screen 01/25/2017  Previous PAD dx? No  Previous surgical procedure? Yes  Dates of procedures hx of bladder cancer  Pain with walking? Yes  Subsides with rest? Yes  Feet/toe relief with dangling? No  Painful, non-healing ulcers? No  Extremities discolored? Yes      ASSESSMENT AND PLAN:  1.  Abdominal aortic aneurysm: This has been stable in size at 3.2 cm.  Repeat study next year.  Continue treatment of risk factors.  2. Peripheral arterial disease: There is evidence of progression of disease in the left external iliac artery/common femoral artery as well as right popliteal artery.  In spite of that, he continues to deny claudication and there is no evidence of critical limb ischemia.  Thus, we will continue with medical therapy.  3. Hyperlipidemia: Continue treatment with atorvastatin with a target LDL of less than 70.  4.  Coronary artery disease  involving native coronary arteries without angina: Continue medical therapy.  Disposition:   FU with me in 1 year  Signed,  Kathlyn Sacramento, MD  02/10/2021 11:01 AM    [place]

## 2021-02-10 NOTE — Patient Instructions (Signed)
Medication Instructions:  No changes *If you need a refill on your cardiac medications before your next appointment, please call your pharmacy*   Lab Work: None ordered If you have labs (blood work) drawn today and your tests are completely normal, you will receive your results only by: Marland Kitchen MyChart Message (if you have MyChart) OR . A paper copy in the mail If you have any lab test that is abnormal or we need to change your treatment, we will call you to review the results.   Testing/Procedures: Your physician has requested that you have an abdominal aorta duplex in Feb 2023. During this test, an ultrasound is used to evaluate the aorta. Allow 30 minutes for this exam. Do not eat after midnight the day before and avoid carbonated beverages. This will take place at [address], [address].  Your physician has requested that you have a lower extremity arterial duplex in Feb 2023. During this test, ultrasound is used to evaluate arterial blood flow in the legs. Allow one hour for this exam. There are no restrictions or special instructions. This will take place at [place], [address].   Follow-Up: At [facility], you and your health needs are our priority.  As part of our continuing mission to provide you with exceptional heart care, we have created designated Provider Care Teams.  These Care Teams include your primary Cardiologist (physician) and Advanced Practice Providers (APPs -  Physician Assistants and Nurse Practitioners) who all work together to provide you with the care you need, when you need it.  We recommend signing up for the patient portal called "MyChart".  Sign up information is provided on this After Visit Summary.  MyChart is used to connect with patients for Virtual Visits (Telemedicine).  Patients are able to view lab/test results, encounter notes, upcoming appointments, etc.  Non-urgent messages can be sent to your provider as well.   To learn more about what  you can do with MyChart, go to [email or web address].    Your next appointment:   12 month(s)  The format for your next appointment:   In Person  Provider:   Kathlyn Sacramento, MD

## 2021-02-13 DIAGNOSIS — R2681 Unsteadiness on feet: Secondary | ICD-10-CM | POA: Diagnosis not present

## 2021-02-13 DIAGNOSIS — M25512 Pain in left shoulder: Secondary | ICD-10-CM | POA: Diagnosis not present

## 2021-02-13 DIAGNOSIS — M25522 Pain in left elbow: Secondary | ICD-10-CM | POA: Diagnosis not present

## 2021-02-17 ENCOUNTER — Ambulatory Visit: Payer: Medicare Other | Admitting: Neurology

## 2021-02-18 DIAGNOSIS — R2681 Unsteadiness on feet: Secondary | ICD-10-CM | POA: Diagnosis not present

## 2021-02-18 DIAGNOSIS — M25512 Pain in left shoulder: Secondary | ICD-10-CM | POA: Diagnosis not present

## 2021-02-18 DIAGNOSIS — M25522 Pain in left elbow: Secondary | ICD-10-CM | POA: Diagnosis not present

## 2021-02-20 DIAGNOSIS — U071 COVID-19: Secondary | ICD-10-CM | POA: Diagnosis not present

## 2021-02-20 DIAGNOSIS — Z20822 Contact with and (suspected) exposure to covid-19: Secondary | ICD-10-CM | POA: Diagnosis not present

## 2021-02-23 DIAGNOSIS — J069 Acute upper respiratory infection, unspecified: Secondary | ICD-10-CM | POA: Diagnosis not present

## 2021-02-25 DIAGNOSIS — M25522 Pain in left elbow: Secondary | ICD-10-CM | POA: Diagnosis not present

## 2021-02-25 DIAGNOSIS — M25512 Pain in left shoulder: Secondary | ICD-10-CM | POA: Diagnosis not present

## 2021-02-27 DIAGNOSIS — M25512 Pain in left shoulder: Secondary | ICD-10-CM | POA: Diagnosis not present

## 2021-02-27 DIAGNOSIS — R2681 Unsteadiness on feet: Secondary | ICD-10-CM | POA: Diagnosis not present

## 2021-02-27 DIAGNOSIS — M25522 Pain in left elbow: Secondary | ICD-10-CM | POA: Diagnosis not present

## 2021-03-03 DIAGNOSIS — I10 Essential (primary) hypertension: Secondary | ICD-10-CM | POA: Diagnosis not present

## 2021-03-03 DIAGNOSIS — E1169 Type 2 diabetes mellitus with other specified complication: Secondary | ICD-10-CM | POA: Diagnosis not present

## 2021-03-03 DIAGNOSIS — E7800 Pure hypercholesterolemia, unspecified: Secondary | ICD-10-CM | POA: Diagnosis not present

## 2021-03-03 DIAGNOSIS — J449 Chronic obstructive pulmonary disease, unspecified: Secondary | ICD-10-CM | POA: Diagnosis not present

## 2021-03-03 DIAGNOSIS — N183 Chronic kidney disease, stage 3 unspecified: Secondary | ICD-10-CM | POA: Diagnosis not present

## 2021-03-03 DIAGNOSIS — M150 Primary generalized (osteo)arthritis: Secondary | ICD-10-CM | POA: Diagnosis not present

## 2021-03-03 DIAGNOSIS — I252 Old myocardial infarction: Secondary | ICD-10-CM | POA: Diagnosis not present

## 2021-03-03 DIAGNOSIS — I2510 Atherosclerotic heart disease of native coronary artery without angina pectoris: Secondary | ICD-10-CM | POA: Diagnosis not present

## 2021-03-04 ENCOUNTER — Other Ambulatory Visit: Payer: Self-pay

## 2021-03-04 ENCOUNTER — Ambulatory Visit: Payer: Medicare Other | Admitting: Psychology

## 2021-03-04 ENCOUNTER — Encounter: Payer: Self-pay | Admitting: Counselor

## 2021-03-04 ENCOUNTER — Ambulatory Visit (INDEPENDENT_AMBULATORY_CARE_PROVIDER_SITE_OTHER): Payer: Medicare Other | Admitting: Counselor

## 2021-03-04 DIAGNOSIS — G3184 Mild cognitive impairment, so stated: Secondary | ICD-10-CM

## 2021-03-04 DIAGNOSIS — F0670 Mild neurocognitive disorder due to known physiological condition without behavioral disturbance: Secondary | ICD-10-CM | POA: Insufficient documentation

## 2021-03-04 DIAGNOSIS — G319 Degenerative disease of nervous system, unspecified: Secondary | ICD-10-CM

## 2021-03-04 DIAGNOSIS — F09 Unspecified mental disorder due to known physiological condition: Secondary | ICD-10-CM

## 2021-03-04 HISTORY — DX: Mild cognitive impairment of uncertain or unknown etiology: G31.84

## 2021-03-04 HISTORY — DX: Mild neurocognitive disorder due to known physiological condition without behavioral disturbance: F06.70

## 2021-03-04 NOTE — Progress Notes (Signed)
Steven Newman  Patient Name: Steven Newman MRN: [id number] Date of Birth: [birth year] Age: 80 y.o. Education: 12 years  Referral Circumstances and Background Information  Steven Newman is a 80 y.o., right-hand dominant, married man with a history of falls and unsteady gait felt to be likely due to osteoarthritis and diabetic polyneuropathy by Dr. Tomi Likens who saw him recently. Dr. Tomi Likens also noted a 1 year history of memory loss, some minor confusion when driving, and referred him for neuropsychological evaluation in the service of diagnostic clarification.    On interview, the patient reported that he may have problems with memory and thinking "a little bit" but he doesn't think it is anything concerning. He isn't sure how long it has been going on. His wife stated that this has been going on for the past year or so and seemed to focus on what she described as disorientation. He will get confused as to which doctor he is going to, even when she reminds him in the morning and they have an appointment later that day. He doesn't repeat himself although he will ask the same questions repetitively and forget things that she has told him. He seems "a little oblivious" and she again told me the example of him not placing a letter in the mailbox when she has left it on the counter, mentioned in Dr. Georgie Chard note. The patient himself says that sometimes, his wife doesn't wish for him to put the letter in the mailbox so unless she tells him explicitly, he isn't sure what to do. He has difficulties with orientation in terms of losing the month or the year but he loses track of the day of the week. They have a calendar and he doesn't think to use it and will ask her instead. She denied that he has significant word finding problems. He used to be very handy with things and now he has a hard time fixing them and is at a loss when something breaks. He has some general apathy and he is  not as interested in things as in the past. He had a hard time expressing it, but admits that he gets down when I asked about his mood. COVID has been hard and they also had a son die of an aneurysm 5 years ago. They do not have much social interaction. He doesn't have much energy. He gets frustrated that he cannot do things (physically) that he used to do. He does not sleep well, he "thinks about things, small problems end up being big problems to me." He will be in bed 12 hours but he will be up a significant portion of that time. They estimated he gets around 6 hours of sleep per night. He has no dream enactment behavior. He thinks his weight is stable and he has lost a bit of weight since COVID.   With respect to functioning, the patient's wife Steven Newman reported that he has never been very involved with money or finances. He has credit cards although he does not track them. It sounds as though his wife has tried to engage him but he doesn't have much interest. He does not use a computer or a smart phone and never has. He is still driving and does adequately, although as in Dr. Georgie Chard note, he has to think more closely about where he is going. His wife described him as a somewhat aggressive driver, more aggressive than in the past, but he is not  having accidents and has not gotten lost. She is not concerned seriously about his safety, it is just distasteful to her. He also hasn't been driving as much since he fell in January and they do not go out as much. He is able to go to the grocery store but otherwise doesn't go out much. He has no problems getting a short list of items at the grocery store. He manages his own medications and is fairly reliable about them. He has never had many hobbies although he does do crossword puzzles. He hasn't given up any complex hobbies as a result of thinking and memory problems. He used to be more active and would play golf and hang out with friends, although since they moved  here 20 years ago or so, he doesn't have many interests or social outlets.   Past Medical History and Review of Relevant Studies   Patient Active Problem List   Diagnosis Date Noted  . Personal history of tobacco use, presenting hazards to health 08/05/2015  . Neck pain 03/05/2015  . AAA (abdominal aortic aneurysm) ([place]) 11/21/2014  . COPD with emphysema ([place]) 08/09/2013  . OSA (obstructive sleep apnea) 08/09/2013  . Hyperlipidemia   . Hypertension   . CAD (coronary artery disease)   . Obesity   . History of bladder cancer   . Type 2 diabetes mellitus ([place])   . Skin cancer   . Erectile dysfunction     Review of Neuroimaging and Relevant Medical History: The patient denied any history of significant head injuries, strokes, seizures, or having any memory problems after his heart attacks. He has no history of neurological surgery.   He has been diagnosed with COPD and OSA and refuses to use a CPAP (found the mask uncomfortable).   No head injury when he fell in January, it was orthopedic (shoulder injury).   He has an MRI from 2021/02/10 that shows pronounced moderate volume loss mainly in the frontal and parietal areas with relative hippocampal and mesial temporal sparing. There is still some temporal atrophy but it is less than in other areas. There is associated ventricular dilatation. Scattered areas of high T2/FLAIR signal change in the pons and subcortical cerebral white matter likely relate to small vessel ischemia, mild, and not enough to cause a dementia in my opinion.  Current Outpatient Medications  Medication Sig Dispense Refill  . albuterol (PROVENTIL) (2.5 MG/3ML) 0.083% nebulizer solution Take 2.5 mg by nebulization as needed for wheezing or shortness of breath.    . ASPIRIN LOW DOSE 81 MG EC tablet TAKE 1 TABLET DAILY 90 tablet 3  . atorvastatin (LIPITOR) 40 MG tablet TAKE 1 TABLET BY MOUTH DAILY. 90 tablet 3  . [place]-Glucosamine-Vit D (OSTEO BI-FLEX ONE PER DAY  PO) Take 2 tablets by mouth daily.    . Coenzyme Q10 (CO Q-10) 100 MG CAPS Take 1 capsule by mouth daily.    . ergocalciferol (VITAMIN D2) 1.25 MG (50000 UT) capsule Take 50,000 Units by mouth once a week. Currently taking twice a week 01/01/20    . JARDIANCE 25 MG TABS tablet Take 25 mg by mouth daily.    . nadolol (CORGARD) 40 MG tablet Take 1 tablet (40 mg total) by mouth daily. 90 tablet 3  . nitroGLYCERIN (NITROSTAT) 0.4 MG SL tablet Place 1 tablet (0.4 mg total) under the tongue every 5 (five) minutes as needed for chest pain. 25 tablet 3  . Omega-3 Fatty Acids (RA FISH OIL) 1400 MG CPDR Take 1  capsule by mouth daily.     Marland Kitchen oxybutynin (DITROPAN) 5 MG tablet 1 tablet    . PRESCRIPTION MEDICATION Take 1 tablet by mouth daily. Triflex for arthritis as needed    . TURMERIC PO Take 1,000 mg daily by mouth.    . umeclidinium-vilanterol (ANORO ELLIPTA) 62.5-25 MCG/INH AEPB Inhale 1 puff into the lungs daily. 60 each 5  . UNABLE TO FIND Take 5 drops by mouth 2 (two) times daily. Med Name: [employer] CBD Oil     No current facility-administered medications for this visit.    Family History  Problem Relation Age of Onset  . Heart disease Mother   . Rheumatic fever Father   . Rheumatic fever Brother   . Diabetes Sister   . Cancer Sister        cancer on eyelid   There is no  family history of dementia. His parents passed in their mid 9s. He has one sister still living who is 34 and a brother and sister who died and none of them have dementia.There is no  family history of psychiatric illness.  Psychosocial History  Developmental, Educational and Employment History: The patient grew up in [place]. He reported that in school, he did adequately, and denied that he ever had any learning problems. 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 He was somewhat disjointed in his history but it also sounds  like he was in the [place] [employer] and also worked in a Advice worker. The patient was disabled at an early age, he had several heart attacks around age 5 and went out on disability at that time. He and his wife have moved around a fair amount, he stayed home.   Psychiatric History: The patient's wife thinks that he gets depressed and down, although the patient does not think so. He has no formal history of mental health issues or treatment.   Substance Use History: The patient does not drink alcohol regularly, he doesn't use nicotine. He doesn't use any illicit drugs.   Relationship History and Living Cimcumstances: The patient and his wife have been married nearly 63 years. They have one child who passed.   Mental Status and Behavioral Observations  Sensorium/Arousal: The patient's level of arousal was awake and alert. Hearing and vision were adequate with correction (used hearing aids during testing) for testing purposes. Orientation: The patient was oriented to person, place, and situation. He was oriented to month and year but not to date. Aware of current and past president.  Appearance: Dressed in appropriate, casual clothing with adequate grooming and hygiene.  Behavior: The patient was pleasant and appropriate. He presented as minimizing of his wife's cognitive concerns but was reasonable and seemed fairly lucid.  Speech/language: Normal in rate, rhythm, volume, and prosody without prominent word finding or paraphasic errors.  Gait/Posture: Not formally examined, wobbling with Dr. Tomi Likens and unable to tandem walk.  Movement: No overt signs/symptoms of Parkinsonism or hyperkinetic movement disorder.  Social Comportment: Appropriate within social norms Mood: "I get down" Affect: Neutral to euthymic Thought process/content: Thought process was meandering, he had a hard time providing reasonably succinct history, and would launch into details. Content was appropriate though and with  redirection he was able to provide a reasonable personal timeline with help from his wife.  Safety: No safety concerns identified at this encounter.  [facility]: [facility] Cognitive Assessment  03/04/2021  Visuospatial/ Executive (0/5) 2  Naming (0/3) 3  Attention: Read list of digits (0/2) 2  Attention: Read list of letters (0/1) 1  Attention: Serial 7 subtraction starting at 100 (0/3) 3  Language: Repeat phrase (0/2) 0  Language : Fluency (0/1) 0  Abstraction (0/2) 2  Delayed Recall (0/5) 0  Orientation (0/6) 4  Total 17  Adjusted Score (based on education) 18   Test Procedures  Wide Range Achievement Test - 4             Word Reading Neuropsychological Assessment Battery  List Learning  Story Learning  Daily Living Memory  Naming  Digit Span Repeatable Battery for the Assessment of Neuropsychological Status (Form A)  Figure Copy  Judgment of Line Orientation  Coding  Figure Recall The Dot Counting Test A Random Letter Test Controlled Oral Word Association (F-A-S) Semantic Fluency (Animals) Trail Making Test A & B Complex Ideational Material Modified [place] Card Sorting Test Geriatric Depression Scale - Short Form Quick Dementia Rating System (completed by wife, Steven Newman)  Lamb was seen for a psychiatric diagnostic evaluation and neuropsychological testing. He is a pleasant, 16 year old, right-handed man with a 1 year history of unsteady gait, some falls, and more significantly memory and thinking problems. His issues sound relatively mild and it is not clear from their history that he is materially impaired as a result of memory and thinking problems, although his wife does notice definite changes. 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 Full and complete note with impressions, recommendations,  and interpretation of test data to follow.   Viviano Simas Nicole Kindred, PsyD, [place] Clinical Neuropsychologist  Informed Consent  Risks and benefits of the evaluation were discussed with the patient prior to all testing procedures. I conducted a clinical interview and neuropsychological testing (at least two tests) with [name] and Milana Kidney, B.S. (Technician) administered additional test procedures. The patient was able to tolerate the testing procedures and the patient (and/or family if applicable) is likely to benefit from further follow up to receive the diagnosis and treatment recommendations, which will be rendered at the next encounter.

## 2021-03-04 NOTE — Progress Notes (Signed)
   Psychometrist Note   Cognitive testing was administered to Steven Newman by Milana Kidney, B.S. (Technician) under the supervision of Alphonzo Severance, Psy.D., ABN. [name] was able to tolerate all test procedures. Dr. Nicole Kindred met with the patient as needed to manage any emotional reactions to the testing procedures. Rest breaks were offered.    The battery of tests administered was selected by Dr. Nicole Kindred with consideration to the patient's current level of functioning, the nature of his symptoms, emotional and behavioral responses during the interview, level of literacy, observed level of motivation/effort, and the nature of the referral question. This battery was communicated to the psychometrist. Communication between Dr. Nicole Kindred and the psychometrist was ongoing throughout the evaluation and Dr. Nicole Kindred was immediately accessible at all times. Dr. Nicole Kindred provided supervision to the technician on the date of this service, to the extent necessary to assure the quality of all services provided.    [name] will return in approximately one week for an interactive feedback session with Dr. Nicole Kindred, at which time test performance, clinical impressions, and treatment recommendations will be reviewed in detail. The patient understands he can contact our office should he require our assistance before this time.   A total of 105 minutes of billable time were spent with Steven Newman by the technician, including test administration and scoring time. Billing for these services is reflected in Dr. Les Pou note.   This note reflects time spent with the psychometrician and does not include test scores, clinical history, or any interpretations made by Dr. Nicole Kindred. The full report will follow in a separate note.

## 2021-03-05 NOTE — Progress Notes (Signed)
[facility]  Patient Name: Steven Newman MRN: [id number] Date of Birth: [birth year] Age: 80 y.o. Education: 12 years  Measurement properties of test scores: IQ, Index, and Standard Scores (SS): Mean = 100; Standard Deviation = 15 Scaled Scores (Ss): Mean = 10; Standard Deviation = 3 Z scores (Z): Mean = 0; Standard Deviation = 1 T scores (T); Mean = 50; Standard Deviation = 10  TEST SCORES:    Note: This summary of test scores accompanies the interpretive report and should not be interpreted by unqualified individuals or in isolation without reference to the report. Test scores are relative to age, gender, and educational history as available and appropriate.   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      Expected Functioning        Wide Range Achievement Test: Standard/Scaled Score Percentile      Word Reading 93 32      Reynolds Intellectual Screening Test Standard/T-score Percentile      Guess What 51 54      Odd Item Out 56 73  RIST Index 106 66      Attention/Processing Speed        Neuropsychological Assessment Battery (Attention Module, Form 1): T-score Percentile      Digits Forward 61 86      Digits Backwards 47 38      Repeatable Battery for the Assessment of Neuropsychological Status (Form A): Scaled Score Percentile      Coding 3 1      Language        Neuropsychological Assessment Battery (Language Module, Form 1): T-score Percentile      Naming   (29) 52 58      Verbal Fluency: T-score Percentile      Controlled Oral Word Association (F-A-S) 43 25      Semantic Fluency (Animals) 50 50      Memory:        Neuropsychological Assessment Battery (Memory Module, Form 1): T-score Percentile       List Learning           List A Immediate Recall   (2, 4, 6) 40 16         List B Immediate Recall   (2) 44 27         List A Short Delayed Recall   (0) 29 2         List A Long Delayed Recall   (1) 37 9         List A Percent Retention   (0 %) --- <1         List A Long Delayed Yes/No Recognition Hits   (9) --- 14         List A Long Delayed Yes/No Recognition False Alarms   (9) --- 18         List A Recognition Discriminability Index --- 7      Story Learning           Immediate Recall   (10, 16) 31 3         Delayed Recall   (8) 37 9  Percent Retention   (50 %) --- 12      Daily Living Memory            Immediate Recall   (12, 12) 35 7          Delayed Recall   (5, 0) 32 4          Percent Retention (50 %) --- 5          Recognition Hits    (6) --- 8      Repeatable Battery for the Assessment of Neuropsychological Status (Form A): Scaled Score Percentile         Figure Recall   (4) 5 5      Visuospatial/Constructional Functioning        Repeatable Battery for the Assessment of Neuropsychological Status (Form A): Standard/Scaled Score Percentile     Visuospatial/Constructional Index 87 19         Figure Copy   (18) 11 63         Judgment of Line Orientation   (11) --- 3-9      Executive Functioning        Modified [employer] Test (MWCST): Standard/T-Score Percentile      Number of Categories Correct 44 27      Number of Perseverative Errors 73 99      Number of Total Errors 60 84      Percent Perseverative Errors 69 97  Executive Function Composite 114 82      Trail Making Test: T-Score Percentile      Part A 54 66      Part B 52 58      [place] Diagnostic Aphasia Exam: Raw Score Scaled Score      Complex Ideational Material 11 9      Clock Drawing Raw Score Descriptor      Command 8 Borderline Impairment      Rating Scales        Clinical Dementia Rating Raw Score Descriptor      Sum of Boxes 3.0 Very Mild Dementia      Global Score 0.5 MCI       Quick Dementia Rating System Raw Score Descriptor      Sum of Boxes 0.5 MCI      Total Score 1 Normal  Geriatric Depression Scale - Short Form 5 Negative    Kendle Erker V. Nicole Kindred PsyD, [place] Clinical Neuropsychologist

## 2021-03-06 NOTE — Progress Notes (Signed)
[facility]  Patient Name: KIAAN OVERHOLSER MRN: [id number] Date of Birth: 1941-09-26 Age: 80 y.o. Education: 36 years  Clinical Impressions  CLOYD RAGAS is a 80 y.o., right-hand dominant, married man with a history of occasional falls and unsteady gait (likely due to osteoarthritis and diabetic polyneuropathy as per Dr. Tomi Likens) as well as memory problems noticed mainly by his wife, over the past year. On interview his wife Dewaine Oats notices changes such as having to think longer about where he is going when driving, seeming less aware as though he is "in a daze", and some forgetting. He is not repeating himself, however, and there is no rapid forgetting of information. They do not appreciate any significant impairment in his day-to-day activities as a result of cognitive problems. This is with the caveat that he does not do many complicated tasks, his wife has always managed finances and he has never been into computers or had many intellectually challenging hobbies.   On neuropsychological testing, Mr. Chuba performance was less than expected for an individual of his measured ability on measures of memory. The profile shows poor encoding of information and variable retention of information across time, although he is still retaining some information and thus, this is not a clear storage problem. He also had extremely low performance on a graphomotor measure of association learning and processing speed/efficiency. Language abilities, visuospatial constructional abilities, working memory, and executive functions were all within normal expectations when considered at a domain level. He screened negative for the presence of depression (but admits that he gets down and is frustrated with his physical limitations) and his wife characterized him as in the normal range on severity status staging. I would place him in the MCI to questionable impairment range on the CDR.    Mr. Bergdoll is thus demonstrating cognitive dysfunction involving memory and processing speed deficits but he is still functioning adequately, making MCI the best diagnosis at this point in time. While his MCI could certainly be due to a progressive condition given his demographics and memory loss, it is also possible it is due to other factors. He has numerous risk factors for cognitive impairment including DMII, possible OSA (untreated), COPD, and some microvascular ischemic changes. I also question if he may not have a latent psychiatric disorder in the setting of alexithymia, which I will address with him at follow up. He does have a lot of volume loss on his MRI although it is not in a distribution particularly concerning for AD. He should return for repeat evaluation in 1 to 2 years to monitor his progress and I will counsel him about healthy lifestyle changes.    Diagnostic Impressions: Mild cognitive impairment Possible Adjustment disorder with depressed mood Chronic Obstructive Pulmonary Disease Obstructive Sleep Apnea  Recommendations to be discussed with patient  Your performance and presentation on neuropsychological assessment were consistent with memory performance that was below expectations for an individual of your measured ability. You also had low speed of processing performance. Positively, you did well in all other areas and your memory problems do not appear to represent a clear storage pattern, which is what we expect to see in the most common age-related cause of cognitive decline (Alzheimer's disease). I think that the best diagnosis at this point in time is Mild Cognitive Impairment.   The major difference between mild cognitive impairment (MCI) and dementia is in severity and potential prognosis. Once someone reaches a level of severity adequate to be diagnosed  with a dementia, there is usually progression over time, though this may be years. On the other hand, mild cognitive  impairment, while a significant risk for dementia in future, does not always progress to dementia, and in some instances stays the same or can even revert to normal. It is important to realize that if MCI is due to underlying Alzheimer's disease, it will most likely progress to dementia eventually. The rate of conversion to Alzheimer's dementia from amnestic MCI is about 15% per year versus the general population risk of conversion of 2% per year.   In terms of what is causing your cognitive impairment, I am not entirely sure, but my hunch is that it is at least partially due to reversible causes and treatable factors, which is positive. These include your possible obstructive sleep apnea, COPD, diabetes, and any mood issues. You also have some vascular changes in the brain that could be contributing although they are fairly mild and so I am not sure that is a major player. Importantly, I am not convinced that this is necessarily the beginning of Alzheimer's disease, which would be good, because that is invariably progressive.   First, I would recommend that you discuss with your PCP obtaining an updated sleep study and that you initiate treatment as directed by sleep medicine if you do in fact have obstructive sleep apnea. You have numerous risk factors for OSA and a body habitus that places you at risk. This condition not only contributes to memory problems through disrupting sleep quantity and quality, but it also places you at risk for increased vascular changes in the brain and may contribute to worse control of your other health conditions such as diabetes (sleep is very important for blood sugar regulation). There are modern alternatives that you may find more tolerable than the device that you previously tried.   Second, I would recommend that you start healthy lifestyle changes. This includes eating a heart-healthy, diabetic diet, which we can discuss at follow up. I also suggest that you increase  your activity level and get some regular exercise. You reported you do not feel good with your loss of physicality and getting back into a routine involving strengthening, cardiovascular exercise and activity is probably the best way to combat that. You may also wish for a nutrition consult or diabetes education consult to learn other healthy lifestyle changes that may help.   I do not have any concerns about you driving and would instead suggest that you and your wife continue to monitor the situation.   The following compensatory strategies may be helpful for managing day-to-day memory symptoms: . Minimize distractions and interruptions to the extent possible. Be an active observer, present-minded, and focus attention. Focus on only one task for a period of time.  . Get organized. Establish routines and stick to them. Make and use checklists.  . Use external memory aids as needed, such as a planner and notebook. Repetition, written reminders, and keeping a calendar of appointments may be helpful. Marlene Lard a place to keep your keys, wallet, cell phone, and other personal belongings.  . Break down tasks into smaller steps to help get started and to keep from feeling overwhelmed.  . Increase your success learning information by breaking it into manageable chunks, connecting it to previously learned information, or forming associations with what you are trying to remember.   I would recommend that you return for reevaluation in 1 to 2 years to monitor any changes in your  cognitive status.   Test Findings  Test scores are summarized in additional documentation associated with this encounter. Test scores are relative to age, gender, and educational history as available and appropriate. There were no concerns about performance validity as all findings fell within normal expectations.   General Intellectual Functioning/Achievement:  Performance on single word reading was toward the low end of the  average range. Performance on the RIST index was average and was used as a basis of comparison for cognitive test performance, particularly memory test performance.   Attention and Processing Efficiency: Indicators of attention and working memory fell within normal expectations although there was a relative weakness in working memory relative to simple attention. He scored in the high average range for digit repetition forward and digit repetition backward was average.   Performance on timed measures of processing speed was extremely low with respect to number-symbol coding, whereas simple numeric sequencing was average (the latter is a very easy task).   Language: Language findings reflected reasonable performance with average visual object confrontation naming and generation of words in response to the category prompt "animals." Generating words in response to the letters F-A-S was low average.   Visuospatial Function: Performance on visuospatial and constructional measures was low average at an index level, which is reasonable. He performed at an average level on figure copy, whereas judgment of angular line orientations was unusually low.   Learning and Memory: Performance on measures of learning and memory showed less than expected performance with some encoding problems and variable retention of information across time. The pattern of scores is not clearly convincing for a storage problem, however, and may simply reflect cognitive inefficiency when learning information.   In the verbal realm, Mr. Espinoza learned 2, 4, and 6 words of a 12-item word list across three learning trials. His delayed recall for the word list was 0 but then he did recall one word on long delayed recall. This may be due to retroactive interference from a distractor alternate list read in between immediate and short delayed recall trials. Recognition was low, with as many false positives as correct identifications. Short  story learning was unusually low and delayed recall was low average with low average retention. Memory for brief daily-living type information was unusually low with comparable unusually low delayed recall. Recognition for this information and retention of information across time were both unusually low.   In the visual realm, delayed recall for a modestly complex figure stimulus was unusually low.   Executive Functions: Executive findings were within expectations with a high average score on the [employer] EF Composite Score. Alternating sequencing of numbers and letters of the alphabet was average. Reasoning with verbal information was average on the Complex Ideational Material. Generation of words in response to the letters F-A-S was low average. His clock drawing was borderline, with minor errors in numbering and no size difference between hands.   Rating Scale(s): Mr. Popp was characterized as functioning in the normal (not even MCI range) by his wife, which contrasts to some extent with her report on interview as she was fairly insistent there have been definite changes. I was able to rate a CDR for him and would put him in the MCI to questionable impairment range, with a global score of 0.5 and Sum of Boxes of 3.0. He screened at the margin of positive for the presence of depression but did endorse some items of note, including feeling worthless at times related to  his shoulder. I think he has some hesitancy to disclose psychological and affective distress and/or limited awareness.   Viviano Simas Nicole Kindred, PsyD, ABN Clinical Neuropsychologist  Coding and Compliance  Billing below reflects technician time, my direct face-to-face time with the patient, time spent in test administration, and time spent in professional activities including but not limited to: neuropsychological test interpretation, integration of neuropsychological test data with clinical history, report  preparation, treatment planning, care coordination, and review of diagnostically pertinent medical history or studies.   Services associated with this encounter: Clinical Interview [phone]) plus 160 minutes (96132/96133; Neuropsychological Evaluation by Professional)  27 minutes (96136/96137; Test Administration by Professional) 105 minutes (96138/96139; Neuropsychological Testing by Technician)

## 2021-03-11 ENCOUNTER — Encounter: Payer: Self-pay | Admitting: Counselor

## 2021-03-11 ENCOUNTER — Ambulatory Visit (INDEPENDENT_AMBULATORY_CARE_PROVIDER_SITE_OTHER): Payer: Medicare Other | Admitting: Counselor

## 2021-03-11 ENCOUNTER — Other Ambulatory Visit: Payer: Self-pay

## 2021-03-11 DIAGNOSIS — G3184 Mild cognitive impairment, so stated: Secondary | ICD-10-CM

## 2021-03-11 DIAGNOSIS — J449 Chronic obstructive pulmonary disease, unspecified: Secondary | ICD-10-CM | POA: Diagnosis not present

## 2021-03-11 DIAGNOSIS — G4733 Obstructive sleep apnea (adult) (pediatric): Secondary | ICD-10-CM

## 2021-03-11 NOTE — Progress Notes (Signed)
NEUROPSYCHOLOGY FEEDBACK NOTE [facility]  Feedback Note: I met with Mardene Sayer Avellino to review the findings resulting from his neuropsychological evaluation. Since the last appointment, he has been about the same. He had numerous excuses for his test performance and stated that he was distracted, he found the testing too long, and he was sure he "flunked." Time was spent reviewing the impressions and recommendations that are detailed in the evaluation report. I discussed with him that the testing is not pass or fail, it's intended purpose was not to make him feel down on himself, and in fact he only has mild difficulties. In his case I think his issues are likely due to reversible causes and modifiable risk factors like diabetes, OSA, and the like. He is hesitant to treat his OSA and doesn't understand how he can have the condition after only sleeping a brief period of time during the sleep study. Other topics of discussion as reflected in the patient instructions. I took time to explain the findings and answer all the patient's questions. I encouraged Mr. Sutcliffe to contact me should he have any further questions or if further follow up is desired.   Current Medications and Medical History   Current Outpatient Medications  Medication Sig Dispense Refill  . albuterol (PROVENTIL) (2.5 MG/3ML) 0.083% nebulizer solution Take 2.5 mg by nebulization as needed for wheezing or shortness of breath.    . ASPIRIN LOW DOSE 81 MG EC tablet TAKE 1 TABLET DAILY 90 tablet 3  . atorvastatin (LIPITOR) 40 MG tablet TAKE 1 TABLET BY MOUTH DAILY. 90 tablet 3  . [place]-Glucosamine-Vit D (OSTEO BI-FLEX ONE PER DAY PO) Take 2 tablets by mouth daily.    . Coenzyme Q10 (CO Q-10) 100 MG CAPS Take 1 capsule by mouth daily.    . ergocalciferol (VITAMIN D2) 1.25 MG (50000 UT) capsule Take 50,000 Units by mouth once a week. Currently taking twice a week 01/01/20    . JARDIANCE 25 MG TABS tablet Take 25 mg by mouth daily.     . nadolol (CORGARD) 40 MG tablet Take 1 tablet (40 mg total) by mouth daily. 90 tablet 3  . nitroGLYCERIN (NITROSTAT) 0.4 MG SL tablet Place 1 tablet (0.4 mg total) under the tongue every 5 (five) minutes as needed for chest pain. 25 tablet 3  . Omega-3 Fatty Acids (RA FISH OIL) 1400 MG CPDR Take 1 capsule by mouth daily.     Marland Kitchen oxybutynin (DITROPAN) 5 MG tablet 1 tablet    . PRESCRIPTION MEDICATION Take 1 tablet by mouth daily. Triflex for arthritis as needed    . TURMERIC PO Take 1,000 mg daily by mouth.    . umeclidinium-vilanterol (ANORO ELLIPTA) 62.5-25 MCG/INH AEPB Inhale 1 puff into the lungs daily. 60 each 5  . UNABLE TO FIND Take 5 drops by mouth 2 (two) times daily. Med Name: [place] CBD Oil     No current facility-administered medications for this visit.    Patient Active Problem List   Diagnosis Date Noted  . Personal history of tobacco use, presenting hazards to health 08/05/2015  . Neck pain 03/05/2015  . AAA (abdominal aortic aneurysm) ([place]) 11/21/2014  . COPD with emphysema ([place]) 08/09/2013  . OSA (obstructive sleep apnea) 08/09/2013  . Hyperlipidemia   . Hypertension   . CAD (coronary artery disease)   . Obesity   . History of bladder cancer   . Type 2 diabetes mellitus ([place])   . Skin cancer   .  Erectile dysfunction     Mental Status and Behavioral Observations  NICHOLIS STEPANEK presented on time to the present encounter and was alert and generally oriented. Speech was normal in rate, rhythm, volume, and prosody. Self-reported mood was "allright" and affect was neutral, perhaps a bit irritable. Thought process was logical and goal oriented and thought content was appropriate to the topics discussed. There were no safety concerns identified at today's encounter, such as thoughts of harming self or others.   Plan  Feedback provided regarding the patient's neuropsychological evaluation. He has mild issues at most and I think they are probably not due to a progressive  condition, but at his age it is hard to definitively rule out, and as such I invited him to return for repeat evaluation if desired. Denario Bagot Tanguma was encouraged to contact me if any questions arise or if further follow up is desired.   Viviano Simas Nicole Kindred, PsyD, ABN Clinical Neuropsychologist  Service(s) Provided at This Encounter: 32 minutes [phone]; Conjoint therapy with patient present)

## 2021-03-11 NOTE — Patient Instructions (Signed)
Your performance and presentation on neuropsychological assessment were consistent with memory performance that was below expectations for an individual of your measured ability. You also had low speed of processing performance. Positively, you did well in all other areas and your memory problems do not appear to represent a clear storage pattern, which is what we expect to see in the most common age-related cause of cognitive decline (Alzheimer's disease). I think that the best diagnosis at this point in time is Mild Cognitive Impairment.   The major difference between mild cognitive impairment (MCI) and dementia is in severity and potential prognosis. Once someone reaches a level of severity adequate to be diagnosed with a dementia, there is usually progression over time, though this may be years. On the other hand, mild cognitive impairment, while a significant risk for dementia in future, does not always progress to dementia, and in some instances stays the same or can even revert to normal.It is important to realize that if MCI is due to underlying Alzheimer's disease, it will most likely progress to dementia eventually. The rate of conversion to Alzheimer's dementiafrom amnestic MCI is about 15% per year versus the general population risk of conversion of 2% per year.  In terms of what is causing your cognitive impairment, I am not entirely sure, but my hunch is that it is at least partially due to reversible causes and treatable factors, which is positive. These include your possible obstructive sleep apnea, COPD, diabetes, and any mood issues. You also have some vascular changes in the brain that could be contributing although they are fairly mild and so I am not sure that is a major player. Importantly, I am not convinced that this is necessarily the beginning of Alzheimer's disease, which would be good, because that is invariably progressive.   First, I would recommend that you discuss with  your PCP obtaining an updated sleep study and that you initiate treatment as directed by sleep medicine if you do in fact have obstructive sleep apnea. You have numerous risk factors for OSA and a body habitus that places you at risk. This condition not only contributes to memory problems through disrupting sleep quantity and quality, but it also places you at risk for increased vascular changes in the brain and may contribute to worse control of your other health conditions such as diabetes (sleep is very important for blood sugar regulation). There are modern alternatives that you may find more tolerable than the device that you previously tried.   Second, I would recommend that you start healthy lifestyle changes. This includes eating a heart-healthy, diabetic diet, which we can discuss at follow up. I also suggest that you increase your activity level and get some regular exercise. You reported you do not feel good with your loss of physicality and getting back into a routine involving strengthening, cardiovascular exercise and activity is probably the best way to combat that. You may also wish for a nutrition consult or diabetes education consult to learn other healthy lifestyle changes that may help.   I do not have any concerns about you driving and would instead suggest that you and your wife continue to monitor the situation.   The following compensatory strategies may be helpful for managing day-to-day memory symptoms:  Minimize distractions and interruptions to the extent possible. Be an active observer, present-minded, and focus attention. Focus on only one task for a period of time.   Get organized. Establish routines and stick to them. Make and use  checklists.   Use external memory aids as needed, such as a planner and notebook. Repetition, written reminders, and keeping a calendar of appointments may be helpful.  Designate a place to keep your keys, wallet, cell phone, and other  personal belongings.   Break down tasks into smaller steps to help get started and to keep from feeling overwhelmed.   Increase your success learning information by breaking it into manageable chunks, connecting it to previously learned information, or forming associations with what you are trying to remember.   I would recommend that you return for reevaluation in 1 to 2 years to monitor any changes in your cognitive status.

## 2021-03-20 ENCOUNTER — Other Ambulatory Visit: Payer: Self-pay | Admitting: Adult Health

## 2021-04-02 DIAGNOSIS — E7800 Pure hypercholesterolemia, unspecified: Secondary | ICD-10-CM | POA: Diagnosis not present

## 2021-04-02 DIAGNOSIS — J449 Chronic obstructive pulmonary disease, unspecified: Secondary | ICD-10-CM | POA: Diagnosis not present

## 2021-04-02 DIAGNOSIS — I10 Essential (primary) hypertension: Secondary | ICD-10-CM | POA: Diagnosis not present

## 2021-04-02 DIAGNOSIS — E1169 Type 2 diabetes mellitus with other specified complication: Secondary | ICD-10-CM | POA: Diagnosis not present

## 2021-04-02 DIAGNOSIS — E559 Vitamin D deficiency, unspecified: Secondary | ICD-10-CM | POA: Diagnosis not present

## 2021-04-02 DIAGNOSIS — Z7984 Long term (current) use of oral hypoglycemic drugs: Secondary | ICD-10-CM | POA: Diagnosis not present

## 2021-04-02 DIAGNOSIS — N183 Chronic kidney disease, stage 3 unspecified: Secondary | ICD-10-CM | POA: Diagnosis not present

## 2021-04-04 DIAGNOSIS — Z23 Encounter for immunization: Secondary | ICD-10-CM | POA: Diagnosis not present

## 2021-04-06 DIAGNOSIS — E875 Hyperkalemia: Secondary | ICD-10-CM | POA: Diagnosis not present

## 2021-04-06 DIAGNOSIS — E785 Hyperlipidemia, unspecified: Secondary | ICD-10-CM | POA: Diagnosis not present

## 2021-04-06 DIAGNOSIS — N1832 Chronic kidney disease, stage 3b: Secondary | ICD-10-CM | POA: Diagnosis not present

## 2021-04-06 DIAGNOSIS — I129 Hypertensive chronic kidney disease with stage 1 through stage 4 chronic kidney disease, or unspecified chronic kidney disease: Secondary | ICD-10-CM | POA: Diagnosis not present

## 2021-04-06 DIAGNOSIS — I252 Old myocardial infarction: Secondary | ICD-10-CM | POA: Diagnosis not present

## 2021-04-06 DIAGNOSIS — R32 Unspecified urinary incontinence: Secondary | ICD-10-CM | POA: Diagnosis not present

## 2021-04-06 DIAGNOSIS — E1122 Type 2 diabetes mellitus with diabetic chronic kidney disease: Secondary | ICD-10-CM | POA: Diagnosis not present

## 2021-04-08 DIAGNOSIS — I2510 Atherosclerotic heart disease of native coronary artery without angina pectoris: Secondary | ICD-10-CM | POA: Diagnosis not present

## 2021-04-08 DIAGNOSIS — N183 Chronic kidney disease, stage 3 unspecified: Secondary | ICD-10-CM | POA: Diagnosis not present

## 2021-04-08 DIAGNOSIS — E1169 Type 2 diabetes mellitus with other specified complication: Secondary | ICD-10-CM | POA: Diagnosis not present

## 2021-04-08 DIAGNOSIS — I252 Old myocardial infarction: Secondary | ICD-10-CM | POA: Diagnosis not present

## 2021-04-08 DIAGNOSIS — M150 Primary generalized (osteo)arthritis: Secondary | ICD-10-CM | POA: Diagnosis not present

## 2021-04-08 DIAGNOSIS — J449 Chronic obstructive pulmonary disease, unspecified: Secondary | ICD-10-CM | POA: Diagnosis not present

## 2021-04-08 DIAGNOSIS — I10 Essential (primary) hypertension: Secondary | ICD-10-CM | POA: Diagnosis not present

## 2021-04-08 DIAGNOSIS — E7800 Pure hypercholesterolemia, unspecified: Secondary | ICD-10-CM | POA: Diagnosis not present

## 2021-04-17 DIAGNOSIS — M25522 Pain in left elbow: Secondary | ICD-10-CM | POA: Diagnosis not present

## 2021-04-17 DIAGNOSIS — M25512 Pain in left shoulder: Secondary | ICD-10-CM | POA: Diagnosis not present

## 2021-04-24 DIAGNOSIS — M7071 Other bursitis of hip, right hip: Secondary | ICD-10-CM | POA: Insufficient documentation

## 2021-04-24 DIAGNOSIS — M7052 Other bursitis of knee, left knee: Secondary | ICD-10-CM | POA: Diagnosis not present

## 2021-04-24 DIAGNOSIS — M25562 Pain in left knee: Secondary | ICD-10-CM | POA: Diagnosis not present

## 2021-04-24 DIAGNOSIS — M1712 Unilateral primary osteoarthritis, left knee: Secondary | ICD-10-CM

## 2021-04-24 DIAGNOSIS — M7051 Other bursitis of knee, right knee: Secondary | ICD-10-CM | POA: Diagnosis not present

## 2021-04-24 DIAGNOSIS — M1711 Unilateral primary osteoarthritis, right knee: Secondary | ICD-10-CM | POA: Insufficient documentation

## 2021-04-24 DIAGNOSIS — M25561 Pain in right knee: Secondary | ICD-10-CM | POA: Diagnosis not present

## 2021-04-24 HISTORY — DX: Unilateral primary osteoarthritis, left knee: M17.12

## 2021-04-24 HISTORY — DX: Unilateral primary osteoarthritis, right knee: M17.11

## 2021-04-24 HISTORY — DX: Other bursitis of hip, right hip: M70.71

## 2021-04-30 DIAGNOSIS — M7052 Other bursitis of knee, left knee: Secondary | ICD-10-CM | POA: Diagnosis not present

## 2021-04-30 DIAGNOSIS — M7051 Other bursitis of knee, right knee: Secondary | ICD-10-CM | POA: Diagnosis not present

## 2021-04-30 DIAGNOSIS — M1712 Unilateral primary osteoarthritis, left knee: Secondary | ICD-10-CM | POA: Diagnosis not present

## 2021-05-07 DIAGNOSIS — M1711 Unilateral primary osteoarthritis, right knee: Secondary | ICD-10-CM | POA: Diagnosis not present

## 2021-05-07 DIAGNOSIS — M1712 Unilateral primary osteoarthritis, left knee: Secondary | ICD-10-CM | POA: Diagnosis not present

## 2021-05-11 DIAGNOSIS — M1712 Unilateral primary osteoarthritis, left knee: Secondary | ICD-10-CM | POA: Diagnosis not present

## 2021-05-11 DIAGNOSIS — M1711 Unilateral primary osteoarthritis, right knee: Secondary | ICD-10-CM | POA: Diagnosis not present

## 2021-05-13 DIAGNOSIS — M1712 Unilateral primary osteoarthritis, left knee: Secondary | ICD-10-CM | POA: Diagnosis not present

## 2021-05-13 DIAGNOSIS — M1711 Unilateral primary osteoarthritis, right knee: Secondary | ICD-10-CM | POA: Diagnosis not present

## 2021-05-14 DIAGNOSIS — M19112 Post-traumatic osteoarthritis, left shoulder: Secondary | ICD-10-CM | POA: Diagnosis not present

## 2021-05-14 DIAGNOSIS — M12819 Other specific arthropathies, not elsewhere classified, unspecified shoulder: Secondary | ICD-10-CM | POA: Insufficient documentation

## 2021-05-14 DIAGNOSIS — M75100 Unspecified rotator cuff tear or rupture of unspecified shoulder, not specified as traumatic: Secondary | ICD-10-CM | POA: Insufficient documentation

## 2021-05-14 DIAGNOSIS — M25512 Pain in left shoulder: Secondary | ICD-10-CM | POA: Diagnosis not present

## 2021-05-14 HISTORY — DX: Unspecified rotator cuff tear or rupture of unspecified shoulder, not specified as traumatic: M75.100

## 2021-05-19 ENCOUNTER — Ambulatory Visit: Payer: PRIVATE HEALTH INSURANCE | Admitting: Podiatry

## 2021-05-19 DIAGNOSIS — M1712 Unilateral primary osteoarthritis, left knee: Secondary | ICD-10-CM | POA: Diagnosis not present

## 2021-05-19 DIAGNOSIS — M1711 Unilateral primary osteoarthritis, right knee: Secondary | ICD-10-CM | POA: Diagnosis not present

## 2021-05-22 DIAGNOSIS — M1711 Unilateral primary osteoarthritis, right knee: Secondary | ICD-10-CM | POA: Diagnosis not present

## 2021-05-22 DIAGNOSIS — M1712 Unilateral primary osteoarthritis, left knee: Secondary | ICD-10-CM | POA: Diagnosis not present

## 2021-05-25 DIAGNOSIS — M1711 Unilateral primary osteoarthritis, right knee: Secondary | ICD-10-CM | POA: Diagnosis not present

## 2021-05-25 DIAGNOSIS — M1712 Unilateral primary osteoarthritis, left knee: Secondary | ICD-10-CM | POA: Diagnosis not present

## 2021-05-26 ENCOUNTER — Ambulatory Visit: Payer: PRIVATE HEALTH INSURANCE | Admitting: Podiatry

## 2021-05-29 DIAGNOSIS — M1712 Unilateral primary osteoarthritis, left knee: Secondary | ICD-10-CM | POA: Diagnosis not present

## 2021-05-29 DIAGNOSIS — M1711 Unilateral primary osteoarthritis, right knee: Secondary | ICD-10-CM | POA: Diagnosis not present

## 2021-05-30 ENCOUNTER — Encounter: Payer: Self-pay | Admitting: Podiatry

## 2021-05-30 ENCOUNTER — Ambulatory Visit (INDEPENDENT_AMBULATORY_CARE_PROVIDER_SITE_OTHER): Payer: Medicare Other | Admitting: Podiatry

## 2021-05-30 ENCOUNTER — Other Ambulatory Visit: Payer: Self-pay

## 2021-05-30 DIAGNOSIS — N200 Calculus of kidney: Secondary | ICD-10-CM | POA: Insufficient documentation

## 2021-05-30 DIAGNOSIS — E1169 Type 2 diabetes mellitus with other specified complication: Secondary | ICD-10-CM

## 2021-05-30 DIAGNOSIS — H903 Sensorineural hearing loss, bilateral: Secondary | ICD-10-CM

## 2021-05-30 DIAGNOSIS — I10 Essential (primary) hypertension: Secondary | ICD-10-CM

## 2021-05-30 DIAGNOSIS — Z23 Encounter for immunization: Secondary | ICD-10-CM | POA: Insufficient documentation

## 2021-05-30 DIAGNOSIS — N1832 Chronic kidney disease, stage 3b: Secondary | ICD-10-CM | POA: Insufficient documentation

## 2021-05-30 DIAGNOSIS — R269 Unspecified abnormalities of gait and mobility: Secondary | ICD-10-CM | POA: Insufficient documentation

## 2021-05-30 DIAGNOSIS — E1151 Type 2 diabetes mellitus with diabetic peripheral angiopathy without gangrene: Secondary | ICD-10-CM | POA: Diagnosis not present

## 2021-05-30 DIAGNOSIS — B351 Tinea unguium: Secondary | ICD-10-CM | POA: Diagnosis not present

## 2021-05-30 HISTORY — DX: Sensorineural hearing loss, bilateral: H90.3

## 2021-05-30 HISTORY — DX: Essential (primary) hypertension: I10

## 2021-05-30 HISTORY — DX: Chronic kidney disease, stage 3b: N18.32

## 2021-05-30 HISTORY — DX: Unspecified abnormalities of gait and mobility: R26.9

## 2021-05-30 HISTORY — DX: Calculus of kidney: N20.0

## 2021-05-30 NOTE — Progress Notes (Signed)
  Subjective:  Patient ID: Steven Newman, male    DOB: January 09, 1941,  MRN: [id number]  Chief Complaint  Patient presents with   Nail Problem    np// black toe nails, possible infection    80 y.o. male presents with the above complaint. History confirmed with patient.  Objective:  Physical Exam: warm, good capillary refill, nail exam onychomycosis of the toenails, no trophic changes or ulcerative lesions. DP pulses palpable, PT pulses palpable, and protective sensation intact  No images are attached to the encounter.  Assessment:   1. Onychomycosis of multiple toenails with type 2 diabetes mellitus and peripheral angiopathy ([place])      Plan:  Patient was evaluated and treated and all questions answered.  Onychomycosis, Diabetes, and PAD -Patient is diabetic with a qualifying condition for at risk foot care. -Educated on DM Footcare.  Procedure: Nail Debridement Type of Debridement: manual, sharp debridement. Instrumentation: Nail nipper, rotary burr. Number of Nails: 10  Return in about 3 months (around 08/30/2021) for Diabetic Foot Care.

## 2021-05-30 NOTE — Patient Instructions (Signed)

## 2021-06-01 DIAGNOSIS — M1711 Unilateral primary osteoarthritis, right knee: Secondary | ICD-10-CM | POA: Diagnosis not present

## 2021-06-01 DIAGNOSIS — M1712 Unilateral primary osteoarthritis, left knee: Secondary | ICD-10-CM | POA: Diagnosis not present

## 2021-06-02 DIAGNOSIS — M150 Primary generalized (osteo)arthritis: Secondary | ICD-10-CM | POA: Diagnosis not present

## 2021-06-02 DIAGNOSIS — J449 Chronic obstructive pulmonary disease, unspecified: Secondary | ICD-10-CM | POA: Diagnosis not present

## 2021-06-02 DIAGNOSIS — I10 Essential (primary) hypertension: Secondary | ICD-10-CM | POA: Diagnosis not present

## 2021-06-02 DIAGNOSIS — E1169 Type 2 diabetes mellitus with other specified complication: Secondary | ICD-10-CM | POA: Diagnosis not present

## 2021-06-02 DIAGNOSIS — N183 Chronic kidney disease, stage 3 unspecified: Secondary | ICD-10-CM | POA: Diagnosis not present

## 2021-06-02 DIAGNOSIS — I252 Old myocardial infarction: Secondary | ICD-10-CM | POA: Diagnosis not present

## 2021-06-02 DIAGNOSIS — I2510 Atherosclerotic heart disease of native coronary artery without angina pectoris: Secondary | ICD-10-CM | POA: Diagnosis not present

## 2021-06-02 DIAGNOSIS — E7800 Pure hypercholesterolemia, unspecified: Secondary | ICD-10-CM | POA: Diagnosis not present

## 2021-06-04 DIAGNOSIS — M1712 Unilateral primary osteoarthritis, left knee: Secondary | ICD-10-CM | POA: Diagnosis not present

## 2021-06-04 DIAGNOSIS — M1711 Unilateral primary osteoarthritis, right knee: Secondary | ICD-10-CM | POA: Diagnosis not present

## 2021-06-10 DIAGNOSIS — M1712 Unilateral primary osteoarthritis, left knee: Secondary | ICD-10-CM | POA: Diagnosis not present

## 2021-06-10 DIAGNOSIS — M1711 Unilateral primary osteoarthritis, right knee: Secondary | ICD-10-CM | POA: Diagnosis not present

## 2021-06-11 DIAGNOSIS — R351 Nocturia: Secondary | ICD-10-CM | POA: Diagnosis not present

## 2021-06-11 DIAGNOSIS — N3941 Urge incontinence: Secondary | ICD-10-CM | POA: Diagnosis not present

## 2021-06-11 DIAGNOSIS — R350 Frequency of micturition: Secondary | ICD-10-CM | POA: Diagnosis not present

## 2021-06-12 DIAGNOSIS — M1711 Unilateral primary osteoarthritis, right knee: Secondary | ICD-10-CM | POA: Diagnosis not present

## 2021-06-12 DIAGNOSIS — M1712 Unilateral primary osteoarthritis, left knee: Secondary | ICD-10-CM | POA: Diagnosis not present

## 2021-06-15 DIAGNOSIS — D696 Thrombocytopenia, unspecified: Secondary | ICD-10-CM | POA: Diagnosis not present

## 2021-06-15 DIAGNOSIS — R233 Spontaneous ecchymoses: Secondary | ICD-10-CM | POA: Diagnosis not present

## 2021-06-15 DIAGNOSIS — I2510 Atherosclerotic heart disease of native coronary artery without angina pectoris: Secondary | ICD-10-CM | POA: Diagnosis not present

## 2021-06-15 DIAGNOSIS — M170 Bilateral primary osteoarthritis of knee: Secondary | ICD-10-CM | POA: Diagnosis not present

## 2021-06-16 NOTE — Progress Notes (Addendum)
NEUROLOGY FOLLOW UP OFFICE NOTE  HELIO LACK [id number]  Assessment/Plan:   Mild cognitive impairment - multifactorial but cannot rule out underlying neurodegenerative disorder   Recommend repeating neuropsychological evaluation in 1 year Follow up with Sharene Butters, PA-C in memory clinic afterwards  Subjective:  Steven Newman is an 80 year old right-handed male with CAD, COPD, HTN, HLD, CKD, type 2 diabetes mellitus and history of bladder cancer who follows up for cognitive deficits.  Accompanied by his wife.  UPDATE: Underwent memory workup: Labs from January included TSH 3.11 and B12 377.  Advised to start B12 1065mcg daily.  MRI of brain without contrast on 01/25/2021 personally reviewed showed age-related generalized volume loss with mild to moderate chronic small vessel ischemic changes within the cerebral white matter bilaterally and pons.  He underwent neuropsychological evaluation on 03/04/2021 which showed mild cognitive impairment, possibly related to cerebrovascular disease, diabetes, COPD, possible OSA and questionable latent depression/adjustment disorder but a neurodegenerative disorder could not be ruled out.     HISTORY: Since at least early 2021, his wife has noticed memory problems.  He will sometimes not be aware of things.  For example, if his wife leaves a letter on the counter to be mailed out, he may not place it in the mailbox.  If his wife tells him there is food in the refrigerator, he forgets it is there.  He sometimes forgets the day of the week but he attributes that to being retired and not needing to keep track of the day.  Sometimes he forgets how to get to certain doctor's offices.  However, he says it is because he has many doctors and has to stop and think about where they are located.  He doesn't have trouble driving to his PCP.  He doesn't typically become disoriented driving on familiar routes.  His parents didn't have memory deficits but hey both passed  away at age 31.   Also, for about a year, his wife has noticed unsteady gait.  He seems to wobble from side to side when he walks.  He had a fall in January, in which he tripped on the steps.  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  He also has diabetes that has worsened over the past year, requiring medication.  Last Hgb A1c was 7.  PAST MEDICAL HISTORY: Past Medical History:  Diagnosis Date   CAD (coronary artery disease)    coronary angioplasty 1995   Dyspnea    Erectile dysfunction    History of bladder cancer    Hx of colonic polyps    Hyperlipidemia    Hypertension    Kidney stones    MI (myocardial infarction) ([place])    1976, 1985   Obesity    Pneumonia    Skin cancer    Type 2 diabetes mellitus ([place])    Wheezing     MEDICATIONS: Current Outpatient Medications on File Prior to Visit  Medication Sig Dispense Refill   albuterol (PROVENTIL) (2.5 MG/3ML) 0.083% nebulizer solution Take 2.5 mg by nebulization as needed for wheezing or shortness of breath.     ANORO ELLIPTA 62.5-25 MCG/INH AEPB INHALE 1 PUFF INTO THE LUNGS DAILY 60 each 5   ASPIRIN LOW DOSE 81 MG EC tablet TAKE 1 TABLET DAILY 90 tablet 3   atorvastatin (LIPITOR) 40 MG tablet TAKE 1 TABLET BY MOUTH DAILY. 90 tablet 3   Boswellia-Glucosamine-Vit D (OSTEO BI-FLEX ONE PER DAY PO) Take 2 tablets by  mouth daily.     Coenzyme Q10 (CO Q-10) 100 MG CAPS Take 1 capsule by mouth daily.     ergocalciferol (VITAMIN D2) 1.25 MG (50000 UT) capsule Take 50,000 Units by mouth once a week. Currently taking twice a week 01/01/20     JARDIANCE 25 MG TABS tablet Take 25 mg by mouth daily.     nadolol (CORGARD) 40 MG tablet Take 1 tablet (40 mg total) by mouth daily. 90 tablet 3   nitroGLYCERIN (NITROSTAT) 0.4 MG SL tablet Place 1 tablet (0.4 mg total) under the tongue every 5 (five) minutes as needed for chest pain. 25 tablet 3   Omega-3 Fatty Acids (RA FISH OIL) 1400 MG CPDR Take 1 capsule by mouth daily.       oxybutynin (DITROPAN) 5 MG tablet 1 tablet     PRESCRIPTION MEDICATION Take 1 tablet by mouth daily. Triflex for arthritis as needed     tiZANidine (ZANAFLEX) 2 MG tablet Take 2 mg by mouth 2 (two) times daily as needed.     TURMERIC PO Take 1,000 mg daily by mouth.     UNABLE TO FIND Take 5 drops by mouth 2 (two) times daily. Med Name: [employer] CBD Oil     No current facility-administered medications on file prior to visit.    ALLERGIES: Allergies  Allergen Reactions   Ace Inhibitors     Angioedema    Amoxicillin-Pot Clavulanate Other (See Comments)   Penicillins Swelling and Rash    Lips swell    FAMILY HISTORY: Family History  Problem Relation Age of Onset   Heart disease Mother    Rheumatic fever Father    Rheumatic fever Brother    Diabetes Sister    Cancer Sister        cancer on eyelid      Objective:  Blood pressure (!) 157/86, pulse 65, height 5\' 9"  (1.753 m), weight 208 lb (94.3 kg), SpO2 95 %. General: No acute distress.  Patient appears well-groomed.    Metta Clines, DO  CC: Leeroy Cha, MD

## 2021-06-17 ENCOUNTER — Telehealth: Payer: Self-pay

## 2021-06-17 ENCOUNTER — Ambulatory Visit (INDEPENDENT_AMBULATORY_CARE_PROVIDER_SITE_OTHER): Payer: Medicare Other | Admitting: Neurology

## 2021-06-17 ENCOUNTER — Other Ambulatory Visit: Payer: Self-pay

## 2021-06-17 ENCOUNTER — Encounter: Payer: Self-pay | Admitting: Neurology

## 2021-06-17 VITALS — BP 157/86 | HR 65 | Ht 69.0 in | Wt 208.0 lb

## 2021-06-17 DIAGNOSIS — I2510 Atherosclerotic heart disease of native coronary artery without angina pectoris: Secondary | ICD-10-CM

## 2021-06-17 DIAGNOSIS — G3184 Mild cognitive impairment, so stated: Secondary | ICD-10-CM

## 2021-06-17 NOTE — Patient Instructions (Signed)
Repeat neurocognitive testing with Dr. Nicole Kindred in one year Follow up with Etheleen Mayhew PA-C in the memory clinic afterwards

## 2021-06-17 NOTE — Telephone Encounter (Signed)
   Name: Steven Newman  DOB: [birth year]  MRN: [id number]   Primary Cardiologist: Larae Grooms, MD  Chart reviewed as part of pre-operative protocol coverage. Patient was contacted 06/17/2021 in reference to pre-operative risk assessment for pending surgery as outlined below.  Steven Newman was last seen on 02/10/21 by Dr. Fletcher Anon.  Since that day, Steven Newman has done well.  He can complete more than 4.0 METS without angina (stairs and walks daily). He understands he is at greater risk for any procedure given his CAD, and agrees to proceed.  Therefore, based on ACC/AHA guidelines, the patient would be at acceptable risk for the planned procedure without further cardiovascular testing.   The patient was advised that if he develops new symptoms prior to surgery to contact our office to arrange for a follow-up visit, and he verbalized understanding.  I will route this recommendation to the requesting party via Epic fax function and remove from pre-op pool. Please call with questions.  Ledora Bottcher, PA 06/17/2021, 4:48 PM

## 2021-06-17 NOTE — Telephone Encounter (Signed)
   [facility] Pre-operative Risk Assessment    Patient Name: Steven Newman  DOB: 1941-01-11 MRN: [id number]   Request for surgical clearance:  What type of surgery is being performed? LEFT  SHOULDER REVERSE TOTAL SHOULDER ARTHROPLASTY  When is this surgery scheduled? TBD  What type of clearance is required (medical clearance vs. Pharmacy clearance to hold med vs. Both)? MEDICAL  Are there any medications that need to be held prior to surgery and how long? NO  Practice name and name of physician performing surgery? [facility] DR Remo Lipps [facility]  ATTNCaryl Pina  What is the office phone number? [phone]   7.   What is the office fax number? [phone]  8.   Anesthesia type (None, local, MAC, general) ? GENERAL W/INTRASCALENE BLOCK   Waylan Rocher 06/17/2021, 3:25 PM  _________________________________________________________________   (provider comments below)

## 2021-06-19 DIAGNOSIS — M150 Primary generalized (osteo)arthritis: Secondary | ICD-10-CM | POA: Diagnosis not present

## 2021-06-19 DIAGNOSIS — R0609 Other forms of dyspnea: Secondary | ICD-10-CM | POA: Diagnosis not present

## 2021-06-19 DIAGNOSIS — J449 Chronic obstructive pulmonary disease, unspecified: Secondary | ICD-10-CM | POA: Diagnosis not present

## 2021-06-19 DIAGNOSIS — I2510 Atherosclerotic heart disease of native coronary artery without angina pectoris: Secondary | ICD-10-CM | POA: Diagnosis not present

## 2021-06-19 DIAGNOSIS — E1169 Type 2 diabetes mellitus with other specified complication: Secondary | ICD-10-CM | POA: Diagnosis not present

## 2021-06-19 DIAGNOSIS — E7800 Pure hypercholesterolemia, unspecified: Secondary | ICD-10-CM | POA: Diagnosis not present

## 2021-06-24 NOTE — Telephone Encounter (Signed)
ADDENDUM: note closed in error. I advised the pt and his wife per our HIM Dept that we cannot legally email to the pt as no way to verify if email is secure, as well pt would need to fill out a release giving [place] permission ok to email to their personal email if they prefer. [name] said that was ok. She stated wanted to make sure they had the clearance as she states surgeon's office stated they did not receive clearance from our office. [name] also stated the surgeon's office states they did not get the clearance from the PCP either. I assured the pt and his wife that I will confirm with surgeon's office they did receive the clearance. Pt and his wife thanked me for the help.

## 2021-06-24 NOTE — Telephone Encounter (Signed)
I have re-faxed the clearance request to the surgeon's office. I s/w our HIM Dept who states that we cannot email notes to the pt unless they fill out a release giving [place] permission ok to email to their personal email. I s/w the pt and his wife and informed them that I had re-faxed the clearance to the surgeon's office. I also informed the pt and his wife per our HIM dept that we can

## 2021-06-24 NOTE — Telephone Encounter (Signed)
    [name] calling, she said that [facility] did not receive clearance and requesting if we can refax the clearance and ask if she can get a copy of it email to her. [email or web address]

## 2021-06-24 NOTE — Telephone Encounter (Signed)
Routing message to callback pool to verify that the clearance was faxed appropriately and to verify that a copy can be emailed to the patient as requested below.

## 2021-07-10 DIAGNOSIS — E785 Hyperlipidemia, unspecified: Secondary | ICD-10-CM | POA: Diagnosis not present

## 2021-07-10 DIAGNOSIS — E1122 Type 2 diabetes mellitus with diabetic chronic kidney disease: Secondary | ICD-10-CM | POA: Diagnosis not present

## 2021-07-10 DIAGNOSIS — R32 Unspecified urinary incontinence: Secondary | ICD-10-CM | POA: Diagnosis not present

## 2021-07-10 DIAGNOSIS — I129 Hypertensive chronic kidney disease with stage 1 through stage 4 chronic kidney disease, or unspecified chronic kidney disease: Secondary | ICD-10-CM | POA: Diagnosis not present

## 2021-07-10 DIAGNOSIS — E875 Hyperkalemia: Secondary | ICD-10-CM | POA: Diagnosis not present

## 2021-07-10 DIAGNOSIS — I252 Old myocardial infarction: Secondary | ICD-10-CM | POA: Diagnosis not present

## 2021-07-10 DIAGNOSIS — N1832 Chronic kidney disease, stage 3b: Secondary | ICD-10-CM | POA: Diagnosis not present

## 2021-07-16 DIAGNOSIS — N3941 Urge incontinence: Secondary | ICD-10-CM | POA: Diagnosis not present

## 2021-07-16 DIAGNOSIS — R350 Frequency of micturition: Secondary | ICD-10-CM | POA: Diagnosis not present

## 2021-07-16 NOTE — Patient Instructions (Addendum)
DUE TO COVID-19 ONLY ONE VISITOR IS ALLOWED TO COME WITH YOU AND STAY IN THE WAITING ROOM ONLY DURING PRE OP AND PROCEDURE.   **NO VISITORS ARE ALLOWED IN THE SHORT STAY AREA OR RECOVERY ROOM!!**  IF YOU WILL BE ADMITTED INTO THE HOSPITAL YOU ARE ALLOWED ONLY TWO SUPPORT PEOPLE DURING VISITATION HOURS ONLY (10AM -8PM)   The support person(s) may change daily. The support person(s) must pass our screening, gel in and out, and wear a mask at all times, including in the patient's room. Patients must also wear a mask when staff or their support person are in the room.  No visitors under the age of 43. Any visitor under the age of 53 must be accompanied by an adult.    COVID SWAB TESTING MUST BE COMPLETED ON:  08/05/21 **MUST PRESENT COMPLETED FORM AT TESTING SITE**    [place] (backside of the building). Open 8am-3pm. No appointment needed. You are not required to quarantine, however you are required to wear a well-fitted mask when you are out and around people not in your household.  Hand Hygiene often Do NOT share personal items Notify your provider if you are in close contact with someone who has COVID or you develop fever 100.4 or greater, new onset of sneezing, cough, sore throat, shortness of breath or body aches.       Your procedure is scheduled on: 08/07/21   Report to [facility] Main  Entrance   Report to Short Stay at 5:15 AM   [facility])   Call this number if you have problems the morning of surgery [phone]   Do not eat food :After Midnight.   May have liquids until   4:30 AM day of surgery  CLEAR LIQUID DIET  Foods Allowed                                                                     Foods Excluded  Water, Black Coffee and tea, regular and decaf               liquids that you cannot  Plain Jell-O in any flavor  (No red)                                     see through such as: Fruit ices (not with fruit pulp)                                              milk, soups, orange juice              Iced Popsicles (No red)                                                 All solid food  Apple juices Sports drinks like Gatorade (No red) Lightly seasoned clear broth or consume(fat free) Sugar, honey syrup     Oral Hygiene is also important to reduce your risk of infection.                                    Remember - BRUSH YOUR TEETH THE MORNING OF SURGERY WITH YOUR REGULAR TOOTHPASTE   Take these medicines the morning of surgery with A SIP OF WATER: Inhalers, Lipitor, Mirabegron, Nadolol.   DO NOT TAKE ANY ORAL DIABETIC MEDICATIONS DAY OF YOUR SURGERY  How to Manage Your Diabetes Before and After Surgery  Why is it important to control my blood sugar before and after surgery? Improving blood sugar levels before and after surgery helps healing and can limit problems. A way of improving blood sugar control is eating a healthy diet by:  Eating less sugar and carbohydrates  Increasing activity/exercise  Talking with your doctor about reaching your blood sugar goals High blood sugars (greater than 180 mg/dL) can raise your risk of infections and slow your recovery, so you will need to focus on controlling your diabetes during the weeks before surgery. Make sure that the doctor who takes care of your diabetes knows about your planned surgery including the date and location.  How do I manage my blood sugar before surgery? Check your blood sugar at least 4 times a day, starting 2 days before surgery, to make sure that the level is not too high or low. Check your blood sugar the morning of your surgery when you wake up and every 2 hours until you get to the Short Stay unit. If your blood sugar is less than 70 mg/dL, you will need to treat for low blood sugar: Do not take insulin. Treat a low blood sugar (less than 70 mg/dL) with  cup of clear juice (cranberry or apple), 4 glucose tablets,  OR glucose gel. Recheck blood sugar in 15 minutes after treatment (to make sure it is greater than 70 mg/dL). If your blood sugar is not greater than 70 mg/dL on recheck, call [phone] for further instructions. Report your blood sugar to the short stay nurse when you get to Short Stay.  If you are admitted to the hospital after surgery: Your blood sugar will be checked by the staff and you will probably be given insulin after surgery (instead of oral diabetes medicines) to make sure you have good blood sugar levels. The goal for blood sugar control after surgery is 80-180 mg/dL.   WHAT DO I DO ABOUT MY DIABETES MEDICATION?  Do not take oral diabetes medicines (pills) the morning of surgery.  THE DAY BEFORE SURGERY, do not take Jardiance.      THE MORNING OF SURGERY, do not take Jardiance     Reviewed and Endorsed by [facility] Patient Education Committee, August 2015                               You may not have any metal on your body including hair pins, jewelry, and body piercing             Do not wear lotions, powders, cologne, or deodorant              Men may shave face and neck.   Do not bring valuables to the hospital.  [place] IS NOT             RESPONSIBLE   FOR VALUABLES.   Bring small overnight bag day of surgery.   Bring CPAP tubing and mask day of surgery.    Please call your doctor and ask about Aspirin instructions before surgery.              Please read over the following fact sheets you were given: IF YOU HAVE QUESTIONS ABOUT YOUR PRE OP INSTRUCTIONS PLEASE CALL [phone]- [facility] - Preparing for Surgery Before surgery, you can play an important role.  Because skin is not sterile, your skin needs to be as free of germs as possible.  You can reduce the number of germs on your skin by washing with CHG (chlorahexidine gluconate) soap before surgery.  CHG is an antiseptic cleaner which kills germs and bonds with the skin to continue  killing germs even after washing. Please DO NOT use if you have an allergy to CHG or antibacterial soaps.  If your skin becomes reddened/irritated stop using the CHG and inform your nurse when you arrive at Short Stay. Do not shave (including legs and underarms) for at least 48 hours prior to the first CHG shower.  You may shave your face/neck.  Please follow these instructions carefully:  1.  Shower with CHG Soap the night before surgery and the  morning of surgery.  2.  If you choose to wash your hair, wash your hair first as usual with your normal  shampoo.  3.  After you shampoo, rinse your hair and body thoroughly to remove the shampoo.                             4.  Use CHG as you would any other liquid soap.  You can apply chg directly to the skin and wash.  Gently with a scrungie or clean washcloth.  5.  Apply the CHG Soap to your body ONLY FROM THE NECK DOWN.   Do   not use on face/ open                           Wound or open sores. Avoid contact with eyes, ears mouth and   genitals (private parts).                       Wash face,  Genitals (private parts) with your normal soap.             6.  Wash thoroughly, paying special attention to the area where your    surgery  will be performed.  7.  Thoroughly rinse your body with warm water from the neck down.  8.  DO NOT shower/wash with your normal soap after using and rinsing off the CHG Soap.                9.  Pat yourself dry with a clean towel.            10.  Wear clean pajamas.            11.  Place clean sheets on your bed the night of your first shower and do not  sleep with pets. Day of Surgery : Do not apply any lotions/deodorants the morning of surgery.  Please wear clean clothes to the hospital/surgery center.  FAILURE  TO FOLLOW THESE INSTRUCTIONS MAY RESULT IN THE CANCELLATION OF YOUR SURGERY  PATIENT SIGNATURE_________________________________  NURSE  SIGNATURE__________________________________  ________________________________________________________________________

## 2021-07-16 NOTE — Progress Notes (Signed)
Please place orders for PAT appointment scheduled 07/21/21.

## 2021-07-16 NOTE — Progress Notes (Addendum)
COVID test: 08/05/21  COVID Vaccine Completed: yes x4 Date COVID Vaccine completed: Has received booster: COVID vaccine manufacturer:    Moderna     Date of COVID positive in last 90 days: No  PCP - Leeroy Cha, MD Cardiologist - Larae Grooms, MD  Cardiac clearance note dated 06/17/21 by Fabian Sharp.  Chest x-ray - N/a EKG - 11/13/20 Epic Stress Test - N/a ECHO - 08/09/18 Epic Cardiac Cath - long time ago Pacemaker/ICD device last checked: N/a Spinal Cord Stimulator: N/a  Sleep Study - yes CPAP - no   Fasting Blood Sugar - 130-150s Checks Blood Sugar ___1__ times a day  Blood Thinner Instructions: Aspirin Instructions: ASA 81 Last Dose: no instructions given. Instructed patient to call PCP.  Activity level: Can go up a flight of stairs and perform activities of daily living without stopping and without symptoms of chest pain or shortness of breath. Slow to get up steps.       Anesthesia review: post MI, CAD, PCI, HTN, SOB, RBBB, OSA, DM, aortic stenosis, COPD  Patient denies shortness of breath, fever, cough and chest pain at PAT appointment   Patient verbalized understanding of instructions that were given to them at the PAT appointment. Patient was also instructed that they will need to review over the PAT instructions again at home before surgery.

## 2021-07-20 ENCOUNTER — Encounter (HOSPITAL_COMMUNITY): Admission: RE | Admit: 2021-07-20 | Payer: Medicare Other | Source: Ambulatory Visit

## 2021-07-21 ENCOUNTER — Encounter (HOSPITAL_COMMUNITY): Payer: Self-pay

## 2021-07-21 ENCOUNTER — Other Ambulatory Visit: Payer: Self-pay

## 2021-07-21 ENCOUNTER — Encounter (HOSPITAL_COMMUNITY)
Admission: RE | Admit: 2021-07-21 | Discharge: 2021-07-21 | Disposition: A | Discharge status: Home / Self Care | Payer: Medicare Other | Source: Ambulatory Visit | Attending: Orthopedic Surgery | Admitting: Orthopedic Surgery

## 2021-07-21 DIAGNOSIS — E1151 Type 2 diabetes mellitus with diabetic peripheral angiopathy without gangrene: Secondary | ICD-10-CM | POA: Diagnosis not present

## 2021-07-21 DIAGNOSIS — J449 Chronic obstructive pulmonary disease, unspecified: Secondary | ICD-10-CM | POA: Insufficient documentation

## 2021-07-21 DIAGNOSIS — Z79899 Other long term (current) drug therapy: Secondary | ICD-10-CM | POA: Diagnosis not present

## 2021-07-21 DIAGNOSIS — M129 Arthropathy, unspecified: Secondary | ICD-10-CM | POA: Insufficient documentation

## 2021-07-21 DIAGNOSIS — I2510 Atherosclerotic heart disease of native coronary artery without angina pectoris: Secondary | ICD-10-CM | POA: Diagnosis not present

## 2021-07-21 DIAGNOSIS — Z01812 Encounter for preprocedural laboratory examination: Secondary | ICD-10-CM | POA: Insufficient documentation

## 2021-07-21 DIAGNOSIS — Z7982 Long term (current) use of aspirin: Secondary | ICD-10-CM | POA: Insufficient documentation

## 2021-07-21 DIAGNOSIS — Z7984 Long term (current) use of oral hypoglycemic drugs: Secondary | ICD-10-CM | POA: Diagnosis not present

## 2021-07-21 DIAGNOSIS — I10 Essential (primary) hypertension: Secondary | ICD-10-CM | POA: Diagnosis not present

## 2021-07-21 HISTORY — DX: Chronic obstructive pulmonary disease, unspecified: J44.9

## 2021-07-21 LAB — CBC
HCT: 49.8 % (ref 39.0–52.0)
Hemoglobin: 16.1 g/dL (ref 13.0–17.0)
MCH: 32.5 pg (ref 26.0–34.0)
MCHC: 32.3 g/dL (ref 30.0–36.0)
MCV: 100.6 fL — ABNORMAL HIGH (ref 80.0–100.0)
Platelets: 115 10*3/uL — ABNORMAL LOW (ref 150–400)
RBC: 4.95 MIL/uL (ref 4.22–5.81)
RDW: 14.9 % (ref 11.5–15.5)
WBC: 7 10*3/uL (ref 4.0–10.5)
nRBC: 0 % (ref 0.0–0.2)

## 2021-07-21 LAB — BASIC METABOLIC PANEL
Anion gap: 7 (ref 5–15)
BUN: 31 mg/dL — ABNORMAL HIGH (ref 8–23)
CO2: 24 mmol/L (ref 22–32)
Calcium: 9.1 mg/dL (ref 8.9–10.3)
Chloride: 110 mmol/L (ref 98–111)
Creatinine, Ser: 1.32 mg/dL — ABNORMAL HIGH (ref 0.61–1.24)
GFR, Estimated: 54 mL/min — ABNORMAL LOW (ref >60)
Glucose, Bld: 152 mg/dL — ABNORMAL HIGH (ref 70–99)
Potassium: 4.6 mmol/L (ref 3.5–5.1)
Sodium: 141 mmol/L (ref 135–145)

## 2021-07-21 LAB — GLUCOSE, CAPILLARY: Glucose-Capillary: 182 mg/dL — ABNORMAL HIGH (ref 70–99)

## 2021-07-21 LAB — SURGICAL PCR SCREEN
MRSA, PCR: NEGATIVE
Staphylococcus aureus: NEGATIVE

## 2021-07-21 LAB — HEMOGLOBIN A1C
Hgb A1c MFr Bld: 6.5 % — ABNORMAL HIGH (ref 4.8–5.6)
Mean Plasma Glucose: 139.85 mg/dL

## 2021-07-22 NOTE — Anesthesia Preprocedure Evaluation (Addendum)
Anesthesia Evaluation  Patient identified by MRN, date of birth, ID band Patient awake    Reviewed: Allergy & Precautions, NPO status , Patient's Chart, lab work & pertinent test results  Airway Mallampati: II  TM Distance: >3 FB Neck ROM: Full    Dental no notable dental hx.    Pulmonary sleep apnea , COPD,  COPD inhaler, former smoker,    Pulmonary exam normal breath sounds clear to auscultation       Cardiovascular hypertension, Pt. on home beta blockers + CAD and + Past MI  Normal cardiovascular exam Rhythm:Regular Rate:Normal  ECG: SB, rate 57   Neuro/Psych negative neurological ROS  negative psych ROS   GI/Hepatic negative GI ROS, Neg liver ROS,   Endo/Other  diabetes, Oral Hypoglycemic Agents  Renal/GU Renal disease     Musculoskeletal negative musculoskeletal ROS (+)   Abdominal (+) + obese,   Peds  Hematology HLD   Anesthesia Other Findings left shoulder cuff arthropathy  Reproductive/Obstetrics                           Anesthesia Physical Anesthesia Plan  ASA: 3  Anesthesia Plan: General and Regional   Post-op Pain Management: GA combined w/ Regional for post-op pain   Induction: Intravenous  PONV Risk Score and Plan: 2 and Ondansetron, Dexamethasone and Treatment may vary due to age or medical condition  Airway Management Planned: Oral ETT  Additional Equipment:   Intra-op Plan:   Post-operative Plan: Extubation in OR  Informed Consent: I have reviewed the patients History and Physical, chart, labs and discussed the procedure including the risks, benefits and alternatives for the proposed anesthesia with the patient or authorized representative who has indicated his/her understanding and acceptance.     Dental advisory given  Plan Discussed with: CRNA  Anesthesia Plan Comments: (Reviewed APP note by Durel Salts, FNP )       Anesthesia Quick Evaluation

## 2021-07-22 NOTE — Progress Notes (Signed)
Anesthesia Chart Review:   Case: [id number] Date/Time: 08/07/21 0715   Procedure: REVERSE SHOULDER ARTHROPLASTY (Left: Shoulder) - with ISB   Anesthesia type: General   Pre-op diagnosis: left shoulder cuff arthropathy   Location: [facility] ROOM 06 / [facility]   Surgeons: Netta Cedars, MD       DISCUSSION: Pt is 80 years old with hx CAD (multiple PCIs in 1990s), AAA (3.2cm), PAD, HTN, DM, COPD  VS: BP (!) 142/71   Pulse 61   Temp 36.6 C (Oral)   Resp 16   Ht '5\' 9"'$  (1.753 m)   Wt 93.3 kg   SpO2 95%   BMI 30.36 kg/m   PROVIDERS: - PCP is Leeroy Cha, MD - Cardiologist is Larae Grooms, MD. Last office visit 11/13/20. Cleared for surgery at acceptable risk on 06/17/21 by Fabian Sharp, [place] - [facility] cardiologist is Kathlyn Sacramento, MD. Last office visit 02/10/21 - Neurologist is Metta Clines, DO who sees pt for memory loss   LABS: Labs reviewed: Acceptable for surgery. (all labs ordered are listed, but only abnormal results are displayed)  Labs Reviewed  HEMOGLOBIN A1C - Abnormal; Notable for the following components:      Result Value   Hgb A1c MFr Bld 6.5 (*)    All other components within normal limits  BASIC METABOLIC PANEL - Abnormal; Notable for the following components:   Glucose, Bld 152 (*)    BUN 31 (*)    Creatinine, Ser 1.32 (*)    GFR, Estimated 54 (*)    All other components within normal limits  CBC - Abnormal; Notable for the following components:   MCV 100.6 (*)    Platelets 115 (*)    All other components within normal limits  GLUCOSE, CAPILLARY - Abnormal; Notable for the following components:   Glucose-Capillary 182 (*)    All other components within normal limits  SURGICAL PCR SCREEN     EKG 11/13/20: NSR, RBBB, inferior MI   CV: AAA duplex 01/21/21: - Abdominal Aorta: There is evidence of abnormal dilation of the Right Common Iliac artery and Left Common Iliac artery. The largest aortic measurement is 3.2 cm. Ectatic proximal and mid abdominal  aorta; stable dimensions compared to prior exam  Echo 08/09/18:  - Left ventricle: Septal apical and inferior basal hypokinesis. Wall thickness was increased in a pattern of severe LVH. Systolic function was normal. The estimated ejection fraction was in the range of 50% to 55%. Left ventricular diastolic function parameters were normal.  - Aortic valve: There was trivial regurgitation.  - Mitral valve: There was mild regurgitation.  - Left atrium: The atrium was moderately dilated.    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    If no changes, I anticipate pt can proceed with surgery as scheduled.   Willeen Cass, PhD, FNP-BC [facility]/Anesthesiology Phone: [phone] 07/22/2021 3:44 PM

## 2021-07-23 ENCOUNTER — Telehealth: Payer: Self-pay | Admitting: Interventional Cardiology

## 2021-07-23 NOTE — Telephone Encounter (Signed)
Wife calling in to verify it pt should remain on ASA for upcoming surgery, unless surgeon would like pt to hold it.   Clearance request did NOT include the need to hold medication. Advised it doesn't appear that they felt like it needed to be stopped. Wife appreciates my return call and will call them to further discuss.

## 2021-07-23 NOTE — Telephone Encounter (Signed)
Patient's wife called with concerns about the aspirin the patient is taking due to bleeding concerns. Please call back

## 2021-08-04 DIAGNOSIS — I2510 Atherosclerotic heart disease of native coronary artery without angina pectoris: Secondary | ICD-10-CM | POA: Diagnosis not present

## 2021-08-04 DIAGNOSIS — M150 Primary generalized (osteo)arthritis: Secondary | ICD-10-CM | POA: Diagnosis not present

## 2021-08-04 DIAGNOSIS — J449 Chronic obstructive pulmonary disease, unspecified: Secondary | ICD-10-CM | POA: Diagnosis not present

## 2021-08-04 DIAGNOSIS — I10 Essential (primary) hypertension: Secondary | ICD-10-CM | POA: Diagnosis not present

## 2021-08-04 DIAGNOSIS — I252 Old myocardial infarction: Secondary | ICD-10-CM | POA: Diagnosis not present

## 2021-08-04 DIAGNOSIS — E7800 Pure hypercholesterolemia, unspecified: Secondary | ICD-10-CM | POA: Diagnosis not present

## 2021-08-04 DIAGNOSIS — N183 Chronic kidney disease, stage 3 unspecified: Secondary | ICD-10-CM | POA: Diagnosis not present

## 2021-08-04 DIAGNOSIS — E1169 Type 2 diabetes mellitus with other specified complication: Secondary | ICD-10-CM | POA: Diagnosis not present

## 2021-08-05 ENCOUNTER — Other Ambulatory Visit: Payer: Self-pay | Admitting: Orthopedic Surgery

## 2021-08-05 ENCOUNTER — Telehealth: Payer: Self-pay | Admitting: Pulmonary Disease

## 2021-08-05 LAB — SARS CORONAVIRUS 2 (TAT 6-24 HRS): SARS Coronavirus 2: NEGATIVE

## 2021-08-05 MED ORDER — ANORO ELLIPTA 62.5-25 MCG/INH IN AEPB: 1.0000 | INHALATION_SPRAY | Freq: Every day | RESPIRATORY_TRACT | 0 refills | Status: AC

## 2021-08-05 MED ORDER — ANORO ELLIPTA 62.5-25 MCG/INH IN AEPB: 1.0000 | INHALATION_SPRAY | Freq: Every day | RESPIRATORY_TRACT | 2 refills | Status: DC

## 2021-08-05 NOTE — H&P (Signed)
Patient's anticipated LOS is less than 2 midnights, meeting these requirements: - Younger than 84 - Lives within 1 hour of care - Has a competent adult at home to recover with post-op recover - NO history of  - Chronic pain requiring opiods  - Diabetes  - Coronary Artery Disease  - Heart failure  - Heart attack  - Stroke  - DVT/VTE  - Cardiac arrhythmia  - Respiratory Failure/COPD  - Renal failure  - Anemia  - Advanced Liver disease     Steven Newman is an 80 y.o. male.    Chief Complaint: left shoulder pain  HPI: Pt is a 80 y.o. male complaining of left shoulder pain for multiple years. Pain had continually increased since the beginning. X-rays in the clinic show end-stage arthritic changes of the left shoulder. Pt has tried various conservative treatments which have failed to alleviate their symptoms, including injections and therapy. Various options are discussed with the patient. Risks, benefits and expectations were discussed with the patient. Patient understand the risks, benefits and expectations and wishes to proceed with surgery.   PCP:  Leeroy Cha, MD  D/C Plans: Home  PMH: Past Medical History:  Diagnosis Date   CAD (coronary artery disease)    coronary angioplasty 1995   COPD (chronic obstructive pulmonary disease) ([place])    Dyspnea    Erectile dysfunction    History of bladder cancer    Hx of colonic polyps    Hyperlipidemia    Hypertension    Kidney stones    MI (myocardial infarction) ([place])    1976, 1985   Obesity    Pneumonia    Skin cancer    Type 2 diabetes mellitus ([place])    Wheezing     PSH: Past Surgical History:  Procedure Laterality Date   bladder cancer surgery     CARDIAC CATHETERIZATION     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 His smoking use included cigarettes. He has a 100.00 pack-year smoking history. He has never used smokeless tobacco.  He reports current alcohol use. He reports that he does not use drugs.  Allergies:  Allergies  Allergen Reactions   Ace Inhibitors     Angioedema    Amoxicillin-Pot Clavulanate Other (See Comments)   Penicillins Swelling and Rash    Lips swell    Medications: No current facility-administered medications for this encounter.   Current Outpatient Medications  Medication Sig Dispense Refill   albuterol (PROVENTIL) (2.5 MG/3ML) 0.083% nebulizer solution Take 2.5 mg by nebulization every 6 (six) hours as needed for wheezing or shortness of breath.     ANORO ELLIPTA 62.5-25 MCG/INH AEPB INHALE 1 PUFF INTO THE LUNGS DAILY 60 each 5   ASPIRIN LOW DOSE 81 MG EC tablet TAKE 1 TABLET DAILY 90 tablet 3   atorvastatin (LIPITOR) 40 MG tablet TAKE 1 TABLET BY MOUTH DAILY. 90 tablet 3   Boswellia-Glucosamine-Vit D (OSTEO BI-FLEX ONE PER DAY PO) Take 2 tablets by mouth daily.     Cholecalciferol (VITAMIN D-3) 125 MCG (5000 UT) TABS Take 5,000 Units by mouth daily.     Coenzyme Q10 (CO Q-10 PO) Take 10 mLs by mouth daily.     JARDIANCE 25 MG TABS tablet Take 25 mg by mouth daily.     mirabegron ER (MYRBETRIQ) 25 MG TB24 tablet Take 25 mg by mouth daily.  nadolol (CORGARD) 40 MG tablet Take 1 tablet (40 mg total) by mouth daily. 90 tablet 3   nitroGLYCERIN (NITROSTAT) 0.4 MG SL tablet Place 1 tablet (0.4 mg total) under the tongue every 5 (five) minutes as needed for chest pain. 25 tablet 3   TURMERIC PO Take 1,000 mg daily by mouth.      No results found for this or any previous visit (from the past 48 hour(s)). No results found.  ROS: Pain with rom of the left upper extremity  Physical Exam: Alert and oriented 80 y.o. male in no acute distress Cranial nerves 2-12 intact Cervical spine: full rom with no tenderness, nv intact distally Chest: active breath sounds bilaterally, no wheeze rhonchi or rales Heart: regular rate and rhythm, no murmur Abd: non tender non distended with active bowel  sounds Hip is stable with rom  Left shoulder painful rom with weaknes No rashes or edema distally Crepitus with rom  Assessment/Plan Assessment: left shoulder cuff arthropathy  Plan:  Patient will undergo a left reverse total shoulder by Dr. Veverly Fells at [facility] Risks benefits and expectations were discussed with the patient. Patient understand risks, benefits and expectations and wishes to proceed. Preoperative templating of the joint replacement has been completed, documented, and submitted to the Operating Room personnel in order to optimize intra-operative equipment management.   Merla Riches PA-C, MPAS [facility] is now [facility] [address] [place], [place],  [address] Phone: [phone] [email or web address] Facebook  [employer]

## 2021-08-05 NOTE — Telephone Encounter (Signed)
Sample placed up front for pick up.  

## 2021-08-05 NOTE — Telephone Encounter (Signed)
Call made to [facility], confirmed DOB. Aldona Bar states they filled out a patient assistance application for this patient for his Anoro. They would like a prescription for the anoro sent to [facility].   Call made to patient, confirmed DOB. I made patient aware his PCP office is requesting a script for Anoro however he has not been seen in a year and needs to make an appt. Patient voices he is having shoulder surgery scheduled for this Friday and will be out of the Anoro by then and does not want to be without it following surgery.   I made him aware we could give him a sample and send in a 30 day supply to [facility] but he needs to be sure to contact us for an appt for further refills. Voiced understanding. Patient to contact us to make an appt once he gets home.   Will route message to provider as FYI.

## 2021-08-07 ENCOUNTER — Observation Stay (HOSPITAL_COMMUNITY): Payer: Medicare Other

## 2021-08-07 ENCOUNTER — Ambulatory Visit (HOSPITAL_COMMUNITY): Payer: Medicare Other | Admitting: Emergency Medicine

## 2021-08-07 ENCOUNTER — Other Ambulatory Visit: Payer: Self-pay

## 2021-08-07 ENCOUNTER — Encounter (HOSPITAL_COMMUNITY)
Admission: RE | Disposition: A | Discharge status: Home / Self Care | Payer: Self-pay | Source: Home / Self Care | Attending: Orthopedic Surgery

## 2021-08-07 ENCOUNTER — Encounter (HOSPITAL_COMMUNITY): Payer: Self-pay | Admitting: Orthopedic Surgery

## 2021-08-07 ENCOUNTER — Ambulatory Visit (HOSPITAL_COMMUNITY): Payer: Medicare Other | Admitting: Certified Registered"

## 2021-08-07 ENCOUNTER — Observation Stay (HOSPITAL_COMMUNITY)
Admission: RE | Admit: 2021-08-07 | Discharge: 2021-08-08 | Disposition: A | Discharge status: Home / Self Care | Payer: Medicare Other | Attending: Orthopedic Surgery | Admitting: Orthopedic Surgery

## 2021-08-07 DIAGNOSIS — I2510 Atherosclerotic heart disease of native coronary artery without angina pectoris: Secondary | ICD-10-CM | POA: Insufficient documentation

## 2021-08-07 DIAGNOSIS — Z8551 Personal history of malignant neoplasm of bladder: Secondary | ICD-10-CM | POA: Diagnosis not present

## 2021-08-07 DIAGNOSIS — Z85828 Personal history of other malignant neoplasm of skin: Secondary | ICD-10-CM | POA: Diagnosis not present

## 2021-08-07 DIAGNOSIS — J449 Chronic obstructive pulmonary disease, unspecified: Secondary | ICD-10-CM | POA: Insufficient documentation

## 2021-08-07 DIAGNOSIS — Z7982 Long term (current) use of aspirin: Secondary | ICD-10-CM | POA: Diagnosis not present

## 2021-08-07 DIAGNOSIS — Z96612 Presence of left artificial shoulder joint: Secondary | ICD-10-CM | POA: Diagnosis not present

## 2021-08-07 DIAGNOSIS — Z96642 Presence of left artificial hip joint: Secondary | ICD-10-CM | POA: Diagnosis not present

## 2021-08-07 DIAGNOSIS — I252 Old myocardial infarction: Secondary | ICD-10-CM | POA: Insufficient documentation

## 2021-08-07 DIAGNOSIS — Z79899 Other long term (current) drug therapy: Secondary | ICD-10-CM | POA: Insufficient documentation

## 2021-08-07 DIAGNOSIS — E1122 Type 2 diabetes mellitus with diabetic chronic kidney disease: Secondary | ICD-10-CM | POA: Diagnosis not present

## 2021-08-07 DIAGNOSIS — E119 Type 2 diabetes mellitus without complications: Secondary | ICD-10-CM | POA: Insufficient documentation

## 2021-08-07 DIAGNOSIS — M19012 Primary osteoarthritis, left shoulder: Secondary | ICD-10-CM | POA: Diagnosis not present

## 2021-08-07 DIAGNOSIS — Z9889 Other specified postprocedural states: Secondary | ICD-10-CM | POA: Diagnosis not present

## 2021-08-07 DIAGNOSIS — I10 Essential (primary) hypertension: Secondary | ICD-10-CM | POA: Insufficient documentation

## 2021-08-07 DIAGNOSIS — N1832 Chronic kidney disease, stage 3b: Secondary | ICD-10-CM | POA: Diagnosis not present

## 2021-08-07 DIAGNOSIS — I129 Hypertensive chronic kidney disease with stage 1 through stage 4 chronic kidney disease, or unspecified chronic kidney disease: Secondary | ICD-10-CM | POA: Diagnosis not present

## 2021-08-07 DIAGNOSIS — Z87891 Personal history of nicotine dependence: Secondary | ICD-10-CM | POA: Insufficient documentation

## 2021-08-07 DIAGNOSIS — Z471 Aftercare following joint replacement surgery: Secondary | ICD-10-CM | POA: Diagnosis not present

## 2021-08-07 DIAGNOSIS — G8918 Other acute postprocedural pain: Secondary | ICD-10-CM | POA: Diagnosis not present

## 2021-08-07 DIAGNOSIS — Z7984 Long term (current) use of oral hypoglycemic drugs: Secondary | ICD-10-CM | POA: Diagnosis not present

## 2021-08-07 DIAGNOSIS — M75122 Complete rotator cuff tear or rupture of left shoulder, not specified as traumatic: Secondary | ICD-10-CM | POA: Diagnosis not present

## 2021-08-07 HISTORY — DX: Presence of left artificial shoulder joint: Z96.612

## 2021-08-07 HISTORY — PX: REVERSE SHOULDER ARTHROPLASTY: SHX5054

## 2021-08-07 LAB — GLUCOSE, CAPILLARY
Glucose-Capillary: 182 mg/dL — ABNORMAL HIGH (ref 70–99)
Glucose-Capillary: 184 mg/dL — ABNORMAL HIGH (ref 70–99)
Glucose-Capillary: 203 mg/dL — ABNORMAL HIGH (ref 70–99)

## 2021-08-07 IMAGING — DX DG SHOULDER 1V*L*
1 series · 1 of 1 positions shown · non-contrast
Comparison: 12/12/2020

CLINICAL DATA: Status post left shoulder arthroplasty.

EXAM:
LEFT SHOULDER

[shoulder ap]
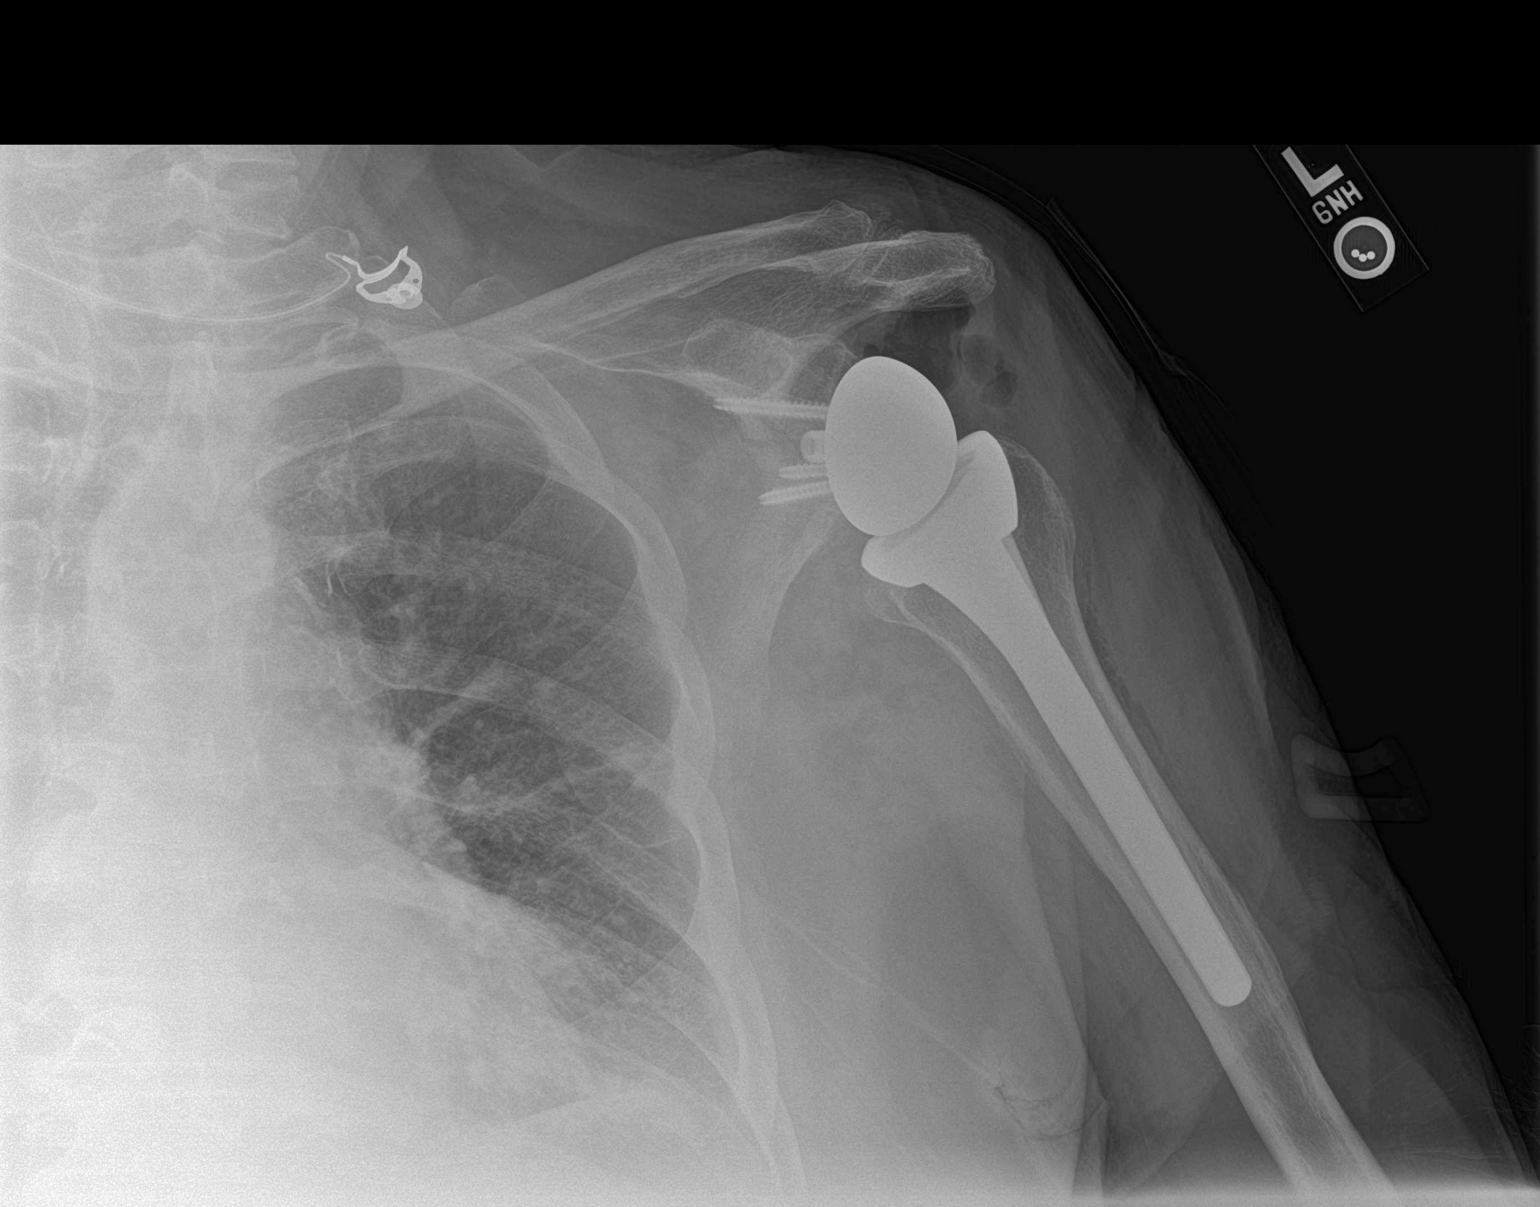

[1 of 1 positions shown; findings below may reference images not displayed]

FINDINGS: Sequelae of left reverse total shoulder arthroplasty are identified.
The prosthetic components appear normally located on this single AP
radiograph. Postoperative gas is noted in the surrounding soft
tissues. No acute fracture is identified. Moderate acromioclavicular
joint space narrowing and marginal spurring are noted.
IMPRESSION: Left reverse total shoulder arthroplasty without evidence of acute
complication.

## 2021-08-07 SURGERY — ARTHROPLASTY, SHOULDER, TOTAL, REVERSE
Anesthesia: Regional | Site: Shoulder | Laterality: Left

## 2021-08-07 MED ORDER — METHOCARBAMOL 500 MG PO TABS: 500.0000 mg | ORAL_TABLET | Freq: Four times a day (QID) | ORAL | Status: DC | PRN

## 2021-08-07 MED ORDER — ALBUTEROL SULFATE (2.5 MG/3ML) 0.083% IN NEBU: 2.5000 mg | INHALATION_SOLUTION | Freq: Four times a day (QID) | RESPIRATORY_TRACT | Status: DC | PRN

## 2021-08-07 MED ORDER — LIDOCAINE 2% (20 MG/ML) 5 ML SYRINGE
INTRAMUSCULAR | Status: DC | PRN
  Administered 2021-08-07: 40 mg via INTRAVENOUS
  Administered 2021-08-07: 20 mg via INTRAVENOUS

## 2021-08-07 MED ORDER — VANCOMYCIN HCL IN DEXTROSE 1-5 GM/200ML-% IV SOLN
1000.0000 mg | Freq: Two times a day (BID) | INTRAVENOUS | Status: AC
  Administered 2021-08-07: 1000 mg via INTRAVENOUS
  Filled 2021-08-07: qty 200

## 2021-08-07 MED ORDER — METOCLOPRAMIDE HCL 5 MG PO TABS
5.0000 mg | ORAL_TABLET | Freq: Three times a day (TID) | ORAL | Status: DC | PRN
Start: 2021-08-07 — End: 2021-08-08

## 2021-08-07 MED ORDER — SODIUM CHLORIDE 0.9 % IV SOLN: INTRAVENOUS | Status: DC

## 2021-08-07 MED ORDER — PROPOFOL 10 MG/ML IV BOLUS
INTRAVENOUS | Status: AC
  Filled 2021-08-07: qty 20

## 2021-08-07 MED ORDER — NADOLOL 40 MG PO TABS
40.0000 mg | ORAL_TABLET | Freq: Every day | ORAL | Status: DC
  Administered 2021-08-08: 40 mg via ORAL
  Filled 2021-08-07: qty 1

## 2021-08-07 MED ORDER — METOCLOPRAMIDE HCL 5 MG/ML IJ SOLN: 5.0000 mg | Freq: Three times a day (TID) | INTRAMUSCULAR | Status: DC | PRN

## 2021-08-07 MED ORDER — VITAMIN D 25 MCG (1000 UNIT) PO TABS
5000.0000 [IU] | ORAL_TABLET | Freq: Every day | ORAL | Status: DC
  Administered 2021-08-07 – 2021-08-08 (×2): 5000 [IU] via ORAL
  Filled 2021-08-07 (×2): qty 5

## 2021-08-07 MED ORDER — ONDANSETRON HCL 4 MG/2ML IJ SOLN
INTRAMUSCULAR | Status: DC | PRN
Start: 2021-08-07 — End: 2021-08-07
  Administered 2021-08-07: 4 mg via INTRAVENOUS

## 2021-08-07 MED ORDER — TRAMADOL HCL 50 MG PO TABS: 50.0000 mg | ORAL_TABLET | Freq: Four times a day (QID) | ORAL | 0 refills | Status: DC | PRN

## 2021-08-07 MED ORDER — ONDANSETRON HCL 4 MG PO TABS: 4.0000 mg | ORAL_TABLET | Freq: Four times a day (QID) | ORAL | Status: DC | PRN

## 2021-08-07 MED ORDER — ACETAMINOPHEN 325 MG PO TABS: 650.0000 mg | ORAL_TABLET | Freq: Four times a day (QID) | ORAL | Status: DC | PRN

## 2021-08-07 MED ORDER — PHENYLEPHRINE HCL-NACL 20-0.9 MG/250ML-% IV SOLN
INTRAVENOUS | Status: DC | PRN
  Administered 2021-08-07: 25 ug/min via INTRAVENOUS

## 2021-08-07 MED ORDER — POLYETHYLENE GLYCOL 3350 17 G PO PACK
17.0000 g | PACK | Freq: Every day | ORAL | Status: DC | PRN
Start: 2021-08-07 — End: 2021-08-08

## 2021-08-07 MED ORDER — UMECLIDINIUM-VILANTEROL 62.5-25 MCG/INH IN AEPB
1.0000 | INHALATION_SPRAY | Freq: Every day | RESPIRATORY_TRACT | Status: DC
  Administered 2021-08-08: 1 via RESPIRATORY_TRACT
  Filled 2021-08-07: qty 14

## 2021-08-07 MED ORDER — BUPIVACAINE-EPINEPHRINE (PF) 0.25% -1:200000 IJ SOLN
INTRAMUSCULAR | Status: AC
  Filled 2021-08-07: qty 30

## 2021-08-07 MED ORDER — PHENOL 1.4 % MT LIQD: 1.0000 | OROMUCOSAL | Status: DC | PRN

## 2021-08-07 MED ORDER — VANCOMYCIN HCL IN DEXTROSE 1-5 GM/200ML-% IV SOLN
1000.0000 mg | INTRAVENOUS | Status: AC
  Administered 2021-08-07: 1000 mg via INTRAVENOUS
  Filled 2021-08-07: qty 200

## 2021-08-07 MED ORDER — FENTANYL CITRATE PF 50 MCG/ML IJ SOSY: 25.0000 ug | PREFILLED_SYRINGE | INTRAMUSCULAR | Status: DC | PRN

## 2021-08-07 MED ORDER — STERILE WATER FOR IRRIGATION IR SOLN
Status: DC | PRN
  Administered 2021-08-07: 2000 mL

## 2021-08-07 MED ORDER — ROCURONIUM BROMIDE 10 MG/ML (PF) SYRINGE
PREFILLED_SYRINGE | INTRAVENOUS | Status: DC | PRN
  Administered 2021-08-07: 30 mg via INTRAVENOUS

## 2021-08-07 MED ORDER — ATORVASTATIN CALCIUM 40 MG PO TABS
40.0000 mg | ORAL_TABLET | Freq: Every day | ORAL | Status: DC
  Administered 2021-08-08: 40 mg via ORAL
  Filled 2021-08-07: qty 1

## 2021-08-07 MED ORDER — PROPOFOL 10 MG/ML IV BOLUS
INTRAVENOUS | Status: DC | PRN
  Administered 2021-08-07: 150 mg via INTRAVENOUS

## 2021-08-07 MED ORDER — ONDANSETRON HCL 4 MG/2ML IJ SOLN: 4.0000 mg | Freq: Four times a day (QID) | INTRAMUSCULAR | Status: DC | PRN

## 2021-08-07 MED ORDER — ASPIRIN EC 81 MG PO TBEC
81.0000 mg | DELAYED_RELEASE_TABLET | Freq: Every day | ORAL | Status: DC
  Administered 2021-08-07 – 2021-08-08 (×2): 81 mg via ORAL
  Filled 2021-08-07 (×2): qty 1

## 2021-08-07 MED ORDER — AMISULPRIDE (ANTIEMETIC) 5 MG/2ML IV SOLN: 5.0000 mg | Freq: Once | INTRAVENOUS | Status: DC | PRN

## 2021-08-07 MED ORDER — ONDANSETRON HCL 4 MG/2ML IJ SOLN: 4.0000 mg | Freq: Once | INTRAMUSCULAR | Status: DC | PRN

## 2021-08-07 MED ORDER — ORAL CARE MOUTH RINSE: 15.0000 mL | Freq: Once | OROMUCOSAL | Status: AC

## 2021-08-07 MED ORDER — TRAMADOL HCL 50 MG PO TABS
50.0000 mg | ORAL_TABLET | Freq: Four times a day (QID) | ORAL | Status: DC | PRN
Start: 2021-08-07 — End: 2021-08-08
  Administered 2021-08-07 – 2021-08-08 (×2): 50 mg via ORAL
  Filled 2021-08-07 (×2): qty 1

## 2021-08-07 MED ORDER — 0.9 % SODIUM CHLORIDE (POUR BTL) OPTIME
TOPICAL | Status: DC | PRN
  Administered 2021-08-07: 1000 mL

## 2021-08-07 MED ORDER — MIDAZOLAM HCL 2 MG/2ML IJ SOLN
INTRAMUSCULAR | Status: DC | PRN
  Administered 2021-08-07: .5 mg via INTRAVENOUS

## 2021-08-07 MED ORDER — ACETAMINOPHEN 500 MG PO TABS
1000.0000 mg | ORAL_TABLET | Freq: Once | ORAL | Status: AC
  Administered 2021-08-07: 1000 mg via ORAL
  Filled 2021-08-07: qty 2

## 2021-08-07 MED ORDER — PHENYLEPHRINE HCL (PRESSORS) 10 MG/ML IV SOLN
INTRAVENOUS | Status: AC
  Filled 2021-08-07: qty 2

## 2021-08-07 MED ORDER — SUGAMMADEX SODIUM 200 MG/2ML IV SOLN
INTRAVENOUS | Status: DC | PRN
  Administered 2021-08-07: 250 mg via INTRAVENOUS

## 2021-08-07 MED ORDER — MENTHOL 3 MG MT LOZG: 1.0000 | LOZENGE | OROMUCOSAL | Status: DC | PRN

## 2021-08-07 MED ORDER — METHOCARBAMOL 500 MG IVPB - SIMPLE MED
500.0000 mg | Freq: Four times a day (QID) | INTRAVENOUS | Status: DC | PRN
  Filled 2021-08-07: qty 50

## 2021-08-07 MED ORDER — NITROGLYCERIN 0.4 MG SL SUBL: 0.4000 mg | SUBLINGUAL_TABLET | SUBLINGUAL | Status: DC | PRN

## 2021-08-07 MED ORDER — LACTATED RINGERS IV SOLN: INTRAVENOUS | Status: DC

## 2021-08-07 MED ORDER — BUPIVACAINE-EPINEPHRINE (PF) 0.25% -1:200000 IJ SOLN
INTRAMUSCULAR | Status: DC | PRN
  Administered 2021-08-07: 11 mL

## 2021-08-07 MED ORDER — DOCUSATE SODIUM 100 MG PO CAPS
100.0000 mg | ORAL_CAPSULE | Freq: Two times a day (BID) | ORAL | Status: DC
  Administered 2021-08-07 – 2021-08-08 (×2): 100 mg via ORAL
  Filled 2021-08-07 (×2): qty 1

## 2021-08-07 MED ORDER — EMPAGLIFLOZIN 25 MG PO TABS
25.0000 mg | ORAL_TABLET | Freq: Every day | ORAL | Status: DC
  Administered 2021-08-08: 25 mg via ORAL
  Filled 2021-08-07: qty 1

## 2021-08-07 MED ORDER — FENTANYL CITRATE (PF) 250 MCG/5ML IJ SOLN
INTRAMUSCULAR | Status: AC
  Filled 2021-08-07: qty 5

## 2021-08-07 MED ORDER — CHLORHEXIDINE GLUCONATE 0.12 % MT SOLN
15.0000 mL | Freq: Once | OROMUCOSAL | Status: AC
  Administered 2021-08-07: 15 mL via OROMUCOSAL

## 2021-08-07 MED ORDER — MIRABEGRON ER 25 MG PO TB24
25.0000 mg | ORAL_TABLET | Freq: Every day | ORAL | Status: DC
  Administered 2021-08-08: 25 mg via ORAL
  Filled 2021-08-07: qty 1

## 2021-08-07 MED ORDER — FENTANYL CITRATE (PF) 250 MCG/5ML IJ SOLN
INTRAMUSCULAR | Status: DC | PRN
  Administered 2021-08-07: 50 ug via INTRAVENOUS
  Administered 2021-08-07 (×2): 25 ug via INTRAVENOUS

## 2021-08-07 MED ORDER — MIDAZOLAM HCL 2 MG/2ML IJ SOLN
INTRAMUSCULAR | Status: AC
  Filled 2021-08-07: qty 2

## 2021-08-07 MED ORDER — TURMERIC 500 MG PO CAPS: 1000.0000 mg | ORAL_CAPSULE | Freq: Every day | ORAL | Status: DC

## 2021-08-07 MED ORDER — BUPIVACAINE-EPINEPHRINE (PF) 0.5% -1:200000 IJ SOLN
INTRAMUSCULAR | Status: DC | PRN
  Administered 2021-08-07: 25 mL via PERINEURAL

## 2021-08-07 MED ORDER — DEXAMETHASONE SODIUM PHOSPHATE 10 MG/ML IJ SOLN
INTRAMUSCULAR | Status: DC | PRN
  Administered 2021-08-07: 5 mg via INTRAVENOUS

## 2021-08-07 SURGICAL SUPPLY — 68 items
BAG COUNTER SPONGE SURGICOUNT (BAG) ×2 IMPLANT
BAG ZIPLOCK 12X15 (MISCELLANEOUS) IMPLANT
BIT DRILL 1.6MX128 (BIT) IMPLANT
BIT DRILL 170X2.5X (BIT) ×1 IMPLANT
BIT DRL 170X2.5X (BIT) ×1
BLADE SAG 18X100X1.27 (BLADE) ×2 IMPLANT
COVER BACK TABLE 60X90IN (DRAPES) ×2 IMPLANT
COVER SURGICAL LIGHT HANDLE (MISCELLANEOUS) ×2 IMPLANT
CUP HUMERAL 42 PLUS 3 (Orthopedic Implant) ×2 IMPLANT
DECANTER SPIKE VIAL GLASS SM (MISCELLANEOUS) ×2 IMPLANT
DRAPE INCISE IOBAN 66X45 STRL (DRAPES) ×2 IMPLANT
DRAPE ORTHO SPLIT 77X108 STRL (DRAPES) ×2
DRAPE SHEET LG 3/4 BI-LAMINATE (DRAPES) ×2 IMPLANT
DRAPE SURG ORHT 6 SPLT 77X108 (DRAPES) ×2 IMPLANT
DRAPE TOP 10253 STERILE (DRAPES) ×2 IMPLANT
DRAPE U-SHAPE 47X51 STRL (DRAPES) ×2 IMPLANT
DRILL 2.5 (BIT) ×1
DRSG ADAPTIC 3X8 NADH LF (GAUZE/BANDAGES/DRESSINGS) ×2 IMPLANT
DRSG PAD ABDOMINAL 8X10 ST (GAUZE/BANDAGES/DRESSINGS) ×2 IMPLANT
DURAPREP 26ML APPLICATOR (WOUND CARE) ×2 IMPLANT
ELECT BLADE TIP CTD 4 INCH (ELECTRODE) ×2 IMPLANT
ELECT NEEDLE TIP 2.8 STRL (NEEDLE) ×2 IMPLANT
ELECT PENCIL ROCKER SW 15FT (MISCELLANEOUS) ×2 IMPLANT
ELECT REM PT RETURN 15FT ADLT (MISCELLANEOUS) ×2 IMPLANT
EPIPHSYSI CENTER SZ 2 LT (Shoulder) ×2 IMPLANT
EPIPHYSIS CENTER SZ 2 LT (Shoulder) ×1 IMPLANT
FACESHIELD WRAPAROUND (MASK) ×2 IMPLANT
GAUZE SPONGE 4X4 12PLY STRL (GAUZE/BANDAGES/DRESSINGS) ×2 IMPLANT
GLENOSPHERE XTEND LAT 42+0 STD (Miscellaneous) ×2 IMPLANT
GLOVE SURG ORTHO LTX SZ7.5 (GLOVE) ×2 IMPLANT
GLOVE SURG ORTHO LTX SZ8.5 (GLOVE) ×2 IMPLANT
GLOVE SURG UNDER POLY LF SZ7.5 (GLOVE) ×2 IMPLANT
GLOVE SURG UNDER POLY LF SZ8.5 (GLOVE) ×2 IMPLANT
GOWN STRL REUS W/TWL XL LVL3 (GOWN DISPOSABLE) ×4 IMPLANT
KIT BASIN OR (CUSTOM PROCEDURE TRAY) ×2 IMPLANT
KIT TURNOVER KIT A (KITS) ×2 IMPLANT
MANIFOLD NEPTUNE II (INSTRUMENTS) ×2 IMPLANT
METAGLENE DELTA EXTEND (Trauma) ×1 IMPLANT
METAGLENE DXTEND (Trauma) ×2 IMPLANT
NEEDLE MAYO CATGUT SZ4 (NEEDLE) IMPLANT
NS IRRIG 1000ML POUR BTL (IV SOLUTION) IMPLANT
PACK SHOULDER (CUSTOM PROCEDURE TRAY) ×2 IMPLANT
PIN GUIDE 1.2 (PIN) ×2 IMPLANT
PIN GUIDE GLENOPHERE 1.5MX300M (PIN) ×2 IMPLANT
PIN METAGLENE 2.5 (PIN) ×2 IMPLANT
PROTECTOR NERVE ULNAR (MISCELLANEOUS) ×2 IMPLANT
RESTRAINT HEAD UNIVERSAL NS (MISCELLANEOUS) ×2 IMPLANT
SCREW 4.5X24MM (Screw) ×1 IMPLANT
SCREW BN 24X4.5XLCK STRL (Screw) ×1 IMPLANT
SCREW LOCK 42 (Screw) ×4 IMPLANT
SLING ARM FOAM STRAP LRG (SOFTGOODS) ×2 IMPLANT
SMARTMIX MINI TOWER (MISCELLANEOUS)
SPONGE T-LAP 18X18 ~~LOC~~+RFID (SPONGE) ×2 IMPLANT
SPONGE T-LAP 4X18 ~~LOC~~+RFID (SPONGE) ×2 IMPLANT
STEM 12 HA (Stem) ×2 IMPLANT
STRIP CLOSURE SKIN 1/2X4 (GAUZE/BANDAGES/DRESSINGS) ×2 IMPLANT
SUCTION FRAZIER HANDLE 10FR (MISCELLANEOUS) ×1
SUCTION TUBE FRAZIER 10FR DISP (MISCELLANEOUS) ×1 IMPLANT
SUT FIBERWIRE #2 38 T-5 BLUE (SUTURE) ×4
SUT MNCRL AB 4-0 PS2 18 (SUTURE) ×2 IMPLANT
SUT VIC AB 0 CT1 36 (SUTURE) ×4 IMPLANT
SUT VIC AB 0 CT2 27 (SUTURE) ×2 IMPLANT
SUT VIC AB 2-0 CT1 27 (SUTURE) ×1
SUT VIC AB 2-0 CT1 TAPERPNT 27 (SUTURE) ×1 IMPLANT
SUTURE FIBERWR #2 38 T-5 BLUE (SUTURE) ×2 IMPLANT
TAPE CLOTH SURG 6X10 WHT LF (GAUZE/BANDAGES/DRESSINGS) ×2 IMPLANT
TOWEL OR 17X26 10 PK STRL BLUE (TOWEL DISPOSABLE) ×2 IMPLANT
TOWER SMARTMIX MINI (MISCELLANEOUS) IMPLANT

## 2021-08-07 NOTE — Interval H&P Note (Signed)
History and Physical Interval Note:  08/07/2021 7:38 AM  Steven Newman  has presented today for surgery, with the diagnosis of left shoulder cuff arthropathy.  The various methods of treatment have been discussed with the patient and family. After consideration of risks, benefits and other options for treatment, the patient has consented to  Procedure(s) with comments: REVERSE SHOULDER ARTHROPLASTY (Left) - with ISB as a surgical intervention.  The patient's history has been reviewed, patient examined, no change in status, stable for surgery.  I have reviewed the patient's chart and labs.  Questions were answered to the patient's satisfaction.     Augustin Schooling

## 2021-08-07 NOTE — Brief Op Note (Signed)
08/07/2021  9:30 AM  PATIENT:  Steven Newman  80 y.o. male  PRE-OPERATIVE DIAGNOSIS:  left shoulder cuff arthropathy,   POST-OPERATIVE DIAGNOSIS:  left shoulder cuff arthropathy  PROCEDURE:  Procedure(s) with comments: REVERSE SHOULDER ARTHROPLASTY (Left) - with ISB DePuy Delta Xtend  SURGEON:  Surgeon(s) and Role:    Netta Cedars, MD - Primary  PHYSICIAN ASSISTANT:   ASSISTANTS: Ventura Bruns, PA-C   ANESTHESIA:   regional and general  EBL:  minimal   BLOOD ADMINISTERED:none  DRAINS: none   LOCAL MEDICATIONS USED:  MARCAINE     SPECIMEN:  No Specimen  DISPOSITION OF SPECIMEN:  N/A  COUNTS:  YES  TOURNIQUET:  * No tourniquets in log *  DICTATION: .Other Dictation: Dictation Number [id number]  PLAN OF CARE: Admit for overnight observation  PATIENT DISPOSITION:  PACU - hemodynamically stable.   Delay start of Pharmacological VTE agent (>24hrs) due to surgical blood loss or risk of bleeding: not applicable

## 2021-08-07 NOTE — Anesthesia Procedure Notes (Signed)
Anesthesia Regional Block: Interscalene brachial plexus block   Pre-Anesthetic Checklist: , timeout performed,  Correct Patient, Correct Site, Correct Laterality,  Correct Procedure, Correct Position, site marked,  Risks and benefits discussed,  Surgical consent,  Pre-op evaluation,  At surgeon's request and post-op pain management  Laterality: Left  Prep: chloraprep       Needles:  Injection technique: Single-shot  Needle Type: Echogenic Stimulator Needle     Needle Length: 9cm  Needle Gauge: 21     Additional Needles:   Procedures:,,,, ultrasound used (permanent image in chart),,    Narrative:  Start time: 08/07/2021 7:20 AM End time: 08/07/2021 7:30 AM Injection made incrementally with aspirations every 5 mL.  Performed by: Personally  Anesthesiologist: Murvin Natal, MD  Additional Notes: Functioning IV was confirmed and monitors were applied.  A timeout was performed. Sterile prep, hand hygiene and sterile gloves were used. A 77m 21ga Arrow echogenic stimulator needle was used. Negative aspiration and negative test dose prior to incremental administration of local anesthetic. The patient tolerated the procedure well.  Ultrasound guidance: relevent anatomy identified, needle position confirmed, local anesthetic spread visualized around nerve(s), vascular puncture avoided.  Image printed for medical record.

## 2021-08-07 NOTE — Op Note (Signed)
NAME: Steven Newman, [place] MEDICAL RECORD NO: [id number] ACCOUNT NO: [id number] DATE OF BIRTH: 1941-10-27 FACILITY: [facility]ATION: [id number] PHYSICIAN: Doran Heater. Veverly Fells, MD  Operative Report   DATE OF PROCEDURE: 08/07/2021  PREOPERATIVE DIAGNOSES:  Left shoulder end-stage osteoarthritis, rotator cuff tear arthropathy.  POSTOPERATIVE DIAGNOSES:  Left shoulder end-stage osteoarthritis, rotator cuff tear arthropathy.  PROCEDURE PERFORMED:  Left reverse shoulder replacement using DePuy Delta Xtend prosthesis with no subscap repair.  ATTENDING SURGEON:  Esmond Plants, MD  ASSISTANT:  Darol Destine, [place], who was scrubbed during the entire procedure and necessary for satisfactory completion of surgery.  ANESTHESIA:  General anesthesia was used plus interscalene block.  ESTIMATED BLOOD LOSS:  Less than 100 mL.  FLUID REPLACEMENT:  1500 mL crystalloid.  Instrument count was correct.  There were no complications.  Perioperative antibiotics were given.  INDICATIONS:  The patient is an 80 year old male with worsening left shoulder pain and dysfunction threatening his independence secondary to bone-on-bone arthritis and end-stage rotator cuff tear arthropathy.  Having failed an extended period of  conservative management and desiring improvement in function and decrease in his pain, he elected to proceed with reverse shoulder replacement.  Informed consent obtained.  DESCRIPTION OF PROCEDURE:  After an adequate level of anesthesia was achieved, the patient was positioned in the modified beach chair position and the left shoulder correctly identified and sterilely prepped and draped in the usual manner.  Timeout  called, verifying correct patient, correct site.  We entered the patient's shoulder using a standard deltopectoral approach, starting at the coracoid process, extending down the anterior humerus.  Dissection was carried out down through the subcutaneous  tissues to identify the cephalic vein.   We retracted that laterally with the deltoid.  The conjoined tendon was identified and retracted medially with the pectoralis.  We identified the biceps tendon and tenodesed that in situ with 0 Vicryl  figure-of-eight suture x2.  We had previously called a timeout prior to any surgical incisions.  Once we had our biceps tenodesis, we released the subscap remnant.  This was not going to be repairable, but we did tag it for protection of the axillary  nerve.  We released the inferior capsule, delivering the humeral head out of the wound.  We entered the proximal humerus with a 6 mm reamer and reamed up to a size 12.  We then placed our 12 T-handle intramedullary guide and resected our head at 10  degrees of retroversion, with the oscillating saw, we removed the head to the back table for bone graft.  We then removed osteophytes from the proximal humerus, reduced the shoulder and subluxed it posteriorly, gaining good exposure of the glenoid face,  did a capsular excision and a glenoid labrum excision and removed the biceps stump, finding the glenoid, still with some cartilage on it.  We removed that with the Cobb elevator.  We then placed our guidepin for metaglene preparation centered low on the  glenoid.  We then reamed for the metaglene baseplate.  We then did our peripheral hand reaming, drilled out our central peg hole and impacted the metaglene into position.  We drilled and placed our screws, 42 screw locked inferiorly, 42 superiorly and 24  anteriorly, locked all those screws.  We had good baseplate security and then secured a 42 standard glenosphere to the baseplate.  Once that was secured, I did a finger sweep to make sure that the soft tissue was not caught up in that bearing.  We  checked  our axillary nerve again.  We then went ahead to her humeral side.  We reamed for the 2 left metaphysis and then placed our trial, which was a 12 stem and a 2 left metaphysis and 10 degrees of retroversion, this was  set on the 0 setting. Once it  was impacted into position, it had good coverage over the bone and we placed a 42+3 standard glenosphere, standard poly onto the humeral tray and reduced the shoulder.  We had excellent soft tissue balance with good tension on the implant.  No gapping  with inferior pull or external rotation.  At this point, we removed the trial components.  We irrigated thoroughly.  We used available bone graft from the head and impaction grafted the HA coated press-fit stem size 12 and the 2 left metaphysis set on 0  setting and placed in 10 degrees of retroversion.  With that impacted in place and stable, we selected the real 42+3 poly, impacted that on the humeral tray and then reduced the shoulder, there was a nice little pop as it reduced, and we had appropriate  tension on the implant.  It was very stable throughout our full arc of motion.  We irrigated thoroughly and then resected the remnant of the subscap prior to closing the deltopectoral interval with #1 Vicryl suture, followed by 2-0 Vicryl for  subcutaneous closure and 4-0 Monocryl for skin.  Steri-Strips were applied followed by sterile dressing.  The patient tolerated surgery well.   SHW D: 08/07/2021 9:40:43 am T: 08/07/2021 10:10:00 pm  JOB: [id number]

## 2021-08-07 NOTE — Anesthesia Procedure Notes (Signed)
Date/Time: 08/07/2021 9:20 AM Performed by: Cynda Familia, CRNA Oxygen Delivery Method: Simple face mask Placement Confirmation: positive ETCO2 and breath sounds checked- equal and bilateral Dental Injury: Teeth and Oropharynx as per pre-operative assessment

## 2021-08-07 NOTE — Progress Notes (Signed)
PHARMACIST - PHYSICIAN ORDER COMMUNICATION  CONCERNING: P&T Medication Policy on Herbal Medications  DESCRIPTION:  This patient's order for:  turmeric  has been noted.  This product(s) is classified as an "herbal" or natural product. Due to a lack of definitive safety studies or FDA approval, nonstandard manufacturing practices, plus the potential risk of unknown drug-drug interactions while on inpatient medications, the Pharmacy and Therapeutics Committee does not permit the use of "herbal" or natural products of this type within [place].   ACTION TAKEN: The pharmacy department is unable to verify this order at this time and your patient has been informed of this safety policy. Please reevaluate patient's clinical condition at discharge and address if the herbal or natural product(s) should be resumed at that time.  

## 2021-08-07 NOTE — Discharge Instructions (Signed)
Ice to the shoulder constantly.  Keep the incision covered and clean and dry for one week, then ok to get it wet in the shower. ° °Do exercise as instructed several times per day. ° °DO NOT reach behind your back or push up out of a chair with the operative arm. ° °Use a sling while you are up and around for comfort, may remove while seated.  Keep pillow propped behind the operative elbow. ° °Follow up with Dr Amery Vandenbos in two weeks in the office, call [phone] for appt °

## 2021-08-07 NOTE — Transfer of Care (Signed)
Immediate Anesthesia Transfer of Care Note  Patient: Steven Newman  Procedure(s) Performed: REVERSE SHOULDER ARTHROPLASTY (Left: Shoulder)  Patient Location: PACU  Anesthesia Type:General  Level of Consciousness: awake and alert   Airway & Oxygen Therapy: Patient Spontanous Breathing and Patient connected to face mask oxygen  Post-op Assessment: Report given to RN and Post -op Vital signs reviewed and stable  Post vital signs: Reviewed and stable  Last Vitals:  Vitals Value Taken Time  BP 127/54 08/07/21 0931  Temp    Pulse 54 08/07/21 0933  Resp 14 08/07/21 0933  SpO2 93 % 08/07/21 0933  Vitals shown include unvalidated device data.  Last Pain:  Vitals:   08/07/21 0624  TempSrc:   PainSc: 0-No pain         Complications: No notable events documented.

## 2021-08-07 NOTE — Anesthesia Procedure Notes (Signed)
Date/Time: 08/07/2021 7:00 AM Performed by: Cynda Familia, CRNA Pre-anesthesia Checklist: Patient identified, Patient being monitored, Suction available, Emergency Drugs available and Timeout performed Oxygen Delivery Method: Nasal cannula Placement Confirmation: positive ETCO2 and breath sounds checked- equal and bilateral Dental Injury: Teeth and Oropharynx as per pre-operative assessment

## 2021-08-07 NOTE — Anesthesia Procedure Notes (Signed)
Procedure Name: Intubation Date/Time: 08/07/2021 7:52 AM Performed by: Cynda Familia, CRNA Pre-anesthesia Checklist: Patient identified, Emergency Drugs available, Suction available and Patient being monitored Patient Re-evaluated:Patient Re-evaluated prior to induction Oxygen Delivery Method: Circle System Utilized Preoxygenation: Pre-oxygenation with 100% oxygen Induction Type: IV induction Ventilation: Mask ventilation without difficulty Laryngoscope Size: Miller and 2 Grade View: Grade II Tube type: Oral Number of attempts: 1 Airway Equipment and Method: Stylet Placement Confirmation: ETT inserted through vocal cords under direct vision, positive ETCO2 and breath sounds checked- equal and bilateral Secured at: 22 cm Tube secured with: Tape Dental Injury: Teeth and Oropharynx as per pre-operative assessment  Comments: Smooth IV induction Ellender-- intubation AM CRNA atraumatic-- teeth and mouth as preop  -pt with chipped front and lower teeth- pt did not report during preop interview-- bilat BS Ellender

## 2021-08-07 NOTE — Anesthesia Postprocedure Evaluation (Signed)
Anesthesia Post Note  Patient: Steven Newman  Procedure(s) Performed: REVERSE SHOULDER ARTHROPLASTY (Left: Shoulder)     Patient location during evaluation: PACU Anesthesia Type: Regional and General Level of consciousness: awake Pain management: pain level controlled Vital Signs Assessment: post-procedure vital signs reviewed and stable Respiratory status: spontaneous breathing, nonlabored ventilation, respiratory function stable and patient connected to nasal cannula oxygen Cardiovascular status: blood pressure returned to baseline and stable Postop Assessment: no apparent nausea or vomiting Anesthetic complications: no   No notable events documented.  Last Vitals:  Vitals:   08/07/21 1341 08/07/21 1801  BP: 134/62 127/80  Pulse: 83 71  Resp: 18 16  Temp: 36.6 C 36.6 C  SpO2: 99% 96%    Last Pain:  Vitals:   08/07/21 1801  TempSrc: Oral  PainSc:                  Damita Eppard P Lysa Livengood

## 2021-08-08 DIAGNOSIS — J449 Chronic obstructive pulmonary disease, unspecified: Secondary | ICD-10-CM | POA: Diagnosis not present

## 2021-08-08 DIAGNOSIS — M19012 Primary osteoarthritis, left shoulder: Secondary | ICD-10-CM | POA: Diagnosis not present

## 2021-08-08 DIAGNOSIS — I10 Essential (primary) hypertension: Secondary | ICD-10-CM | POA: Diagnosis not present

## 2021-08-08 DIAGNOSIS — Z8551 Personal history of malignant neoplasm of bladder: Secondary | ICD-10-CM | POA: Diagnosis not present

## 2021-08-08 DIAGNOSIS — I252 Old myocardial infarction: Secondary | ICD-10-CM | POA: Diagnosis not present

## 2021-08-08 DIAGNOSIS — I2510 Atherosclerotic heart disease of native coronary artery without angina pectoris: Secondary | ICD-10-CM | POA: Diagnosis not present

## 2021-08-08 LAB — HEMOGLOBIN AND HEMATOCRIT, BLOOD
HCT: 41.6 % (ref 39.0–52.0)
Hemoglobin: 13.8 g/dL (ref 13.0–17.0)

## 2021-08-08 LAB — BASIC METABOLIC PANEL
Anion gap: 5 (ref 5–15)
BUN: 27 mg/dL — ABNORMAL HIGH (ref 8–23)
CO2: 24 mmol/L (ref 22–32)
Calcium: 8.3 mg/dL — ABNORMAL LOW (ref 8.9–10.3)
Chloride: 106 mmol/L (ref 98–111)
Creatinine, Ser: 1.36 mg/dL — ABNORMAL HIGH (ref 0.61–1.24)
GFR, Estimated: 52 mL/min — ABNORMAL LOW (ref >60)
Glucose, Bld: 152 mg/dL — ABNORMAL HIGH (ref 70–99)
Potassium: 4.1 mmol/L (ref 3.5–5.1)
Sodium: 135 mmol/L (ref 135–145)

## 2021-08-08 NOTE — Plan of Care (Signed)
Discharge instructions given to the patient and his wife

## 2021-08-08 NOTE — Evaluation (Addendum)
Occupational Therapy Evaluation Patient Details Name: Steven Newman MRN: [id number] DOB: 01/27/1941 Today's Date: 08/08/2021    History of Present Illness Patient underwent a left total shoulder replacement. PMH: COPD, MI, skin CA   Clinical Impression   Patient is a 80 year old male who s/p shoulder replacement without functional use of left non-dominant upper extremity secondary to effects of surgery and interscalene block and shoulder precautions. Therapist provided education and instruction to patient and spouse in regards to exercises, precautions, positioning, donning upper extremity clothing and bathing while maintaining shoulder precautions, ice and edema management and donning/doffing sling. Patient and spouse verbalized understanding and demonstrated as needed. Patient needed assistance to donn shirt, underwear, pants, socks and shoes and provided with instruction on compensatory strategies to perform ADLs. Patient to follow up with MD for further therapy needs.      Follow Up Recommendations  Follow surgeon's recommendation for DC plan and follow-up therapies    Equipment Recommendations  None recommended by OT    Recommendations for Other Services       Precautions / Restrictions Precautions Precautions: Shoulder Type of Shoulder Precautions: No ROM of shoulder, AROM elbow wrist and hand. Shoulder Interventions: Shoulder sling/immobilizer;At all times;Off for dressing/bathing/exercises Precaution Booklet Issued: Yes (comment) (hand out) Restrictions Weight Bearing Restrictions: Yes LUE Weight Bearing: Non weight bearing      Mobility Bed Mobility Overal bed mobility: Modified Independent             General bed mobility comments: with increased time    Transfers Overall transfer level: Modified independent                    Balance                                           ADL either performed or assessed with clinical judgement    ADL Overall ADL's : Needs assistance/impaired Eating/Feeding: Set up;Sitting   Grooming: Oral care;Wash/dry face;Set up;Sitting   Upper Body Bathing: Sitting;Minimal assistance   Lower Body Bathing: Sitting/lateral leans;Moderate assistance   Upper Body Dressing : Moderate assistance;Sitting   Lower Body Dressing: Moderate assistance;Sit to/from stand   Toilet Transfer: Nature conservation officer;Ambulation   Toileting- Clothing Manipulation and Hygiene: Min guard;Sit to/from stand       Functional mobility during ADLs: Min guard General ADL Comments: patient and wife were educated on how to maintain ROM reccomendations during ADL tasks.     Vision Patient Visual Report: No change from baseline       Perception     Praxis      Pertinent Vitals/Pain Pain Assessment: No/denies pain (nerver block in place)     Hand Dominance Right   Extremity/Trunk Assessment Upper Extremity Assessment Upper Extremity Assessment: LUE deficits/detail LUE Deficits / Details: total shoulder. patient has no feeling with nerve block still in place patient was noted to be missing DIPs on digits 2-5.   Lower Extremity Assessment Lower Extremity Assessment: Overall WFL for tasks assessed   Cervical / Trunk Assessment Cervical / Trunk Assessment: Normal   Communication Communication Communication: No difficulties   Cognition Arousal/Alertness: Awake/alert Behavior During Therapy: WFL for tasks assessed/performed Overall Cognitive Status: Within Functional Limits for tasks assessed  General Comments: patient was noted to have some confusion over MD who came to see him this AM with wife explianing x2 to patient who was present in room this AM   General Comments       Exercises     Shoulder Instructions      Home Living Family/patient expects to be discharged to:: Private residence Living Arrangements: Spouse/significant  other Available Help at Discharge: Family;Available 24 hours/day Type of Home: House       Home Layout: Two level Alternate Level Stairs-Number of Steps: over 10 Alternate Level Stairs-Rails: Right Bathroom Shower/Tub: Walk-in shower                    Prior Functioning/Environment Level of Independence: Independent                 OT Problem List: Decreased knowledge of use of DME or AE;Decreased knowledge of precautions;Impaired UE functional use      OT Treatment/Interventions:      OT Goals(Current goals can be found in the care plan section) Acute Rehab OT Goals OT Goal Formulation: All assessment and education complete, DC therapy  OT Frequency:     Barriers to D/C:            Co-evaluation              AM-PAC OT "6 Clicks" Daily Activity     Outcome Measure Help from another person eating meals?: A Little Help from another person taking care of personal grooming?: A Little Help from another person toileting, which includes using toliet, bedpan, or urinal?: A Lot Help from another person bathing (including washing, rinsing, drying)?: A Lot Help from another person to put on and taking off regular upper body clothing?: A Lot Help from another person to put on and taking off regular lower body clothing?: A Lot 6 Click Score: 14   End of Session Nurse Communication: Mobility status  Activity Tolerance: Patient tolerated treatment well Patient left: in bed;with call bell/phone within reach;with family/visitor present  OT Visit Diagnosis: Pain Pain - Right/Left: Left Pain - part of body: Shoulder                Time: [id number] OT Time Calculation (min): 38 min Charges:  OT General Charges $OT Visit: 1 Visit OT Evaluation $OT Eval Low Complexity: 1 Low OT Treatments $Self Care/Home Management : 23-37 mins  Jackelyn Poling OTR/L, MS Acute Rehabilitation Department Office# [phone] Pager# [phone]   [place] 08/08/2021,  10:31 AM

## 2021-08-08 NOTE — Progress Notes (Signed)
    Subjective:  Patient reports pain as mild.  Denies N/V/CP/SOB.   Objective:   VITALS:   Vitals:   08/08/21 0155 08/08/21 0155 08/08/21 0544 08/08/21 0813  BP: 121/66 121/66 124/67   Pulse: 61 61 65   Resp: '18 16 16   '$ Temp: 98.3 F (36.8 C) 98.3 F (36.8 C) 98.9 F (37.2 C)   TempSrc: Oral Oral Oral   SpO2: 96% 96% 95% 96%  Weight:      Height:        NAD Neurovascular intact Sensation intact distally Incision: scant drainage and aquacell applied   Lab Results  Component Value Date   WBC 7.0 07/21/2021   HGB 13.8 08/08/2021   HCT 41.6 08/08/2021   MCV 100.6 (H) 07/21/2021   PLT 115 (L) 07/21/2021   BMET    Component Value Date/Time   NA 135 08/08/2021 0310   K 4.1 08/08/2021 0310   CL 106 08/08/2021 0310   CO2 24 08/08/2021 0310   GLUCOSE 152 (H) 08/08/2021 0310   BUN 27 (H) 08/08/2021 0310   CREATININE 1.36 (H) 08/08/2021 0310   CREATININE 1.68 (H) 10/25/2018 0916   CALCIUM 8.3 (L) 08/08/2021 0310   GFRNONAA 52 (L) 08/08/2021 0310   GFRNONAA 37 (L) 10/25/2018 0916   GFRAA 43 (L) 10/25/2018 0916     Assessment/Plan: 1 Day Post-Op   Active Problems:   H/O total shoulder replacement, left   WBAT with walker DVT ppx: , SCDs, TEDS PO pain control PT/OT Dispo: D/C home     Dorothyann Peng 08/08/2021, 9:05 AM  [facility] is now [facility] [address], [place], [place] [address] Phone: [phone] [email or web address]  [employer]

## 2021-08-08 NOTE — Plan of Care (Signed)
  Problem: Activity: Goal: Ability to tolerate increased activity will improve Outcome: Progressing   Problem: Pain Management: Goal: Pain level will decrease with appropriate interventions Outcome: Progressing   Problem: Clinical Measurements: Goal: Ability to maintain clinical measurements within normal limits will improve Outcome: Progressing

## 2021-08-11 ENCOUNTER — Encounter (HOSPITAL_COMMUNITY): Payer: Self-pay | Admitting: Orthopedic Surgery

## 2021-08-11 NOTE — Discharge Summary (Signed)
In most cases prophylactic antibiotics for Dental procdeures after total joint surgery are not necessary.  Exceptions are as follows:  1. History of prior total joint infection  2. Severely immunocompromised (Organ Transplant, cancer chemotherapy, Rheumatoid biologic meds such as [place])  3. Poorly controlled diabetes (A1C &gt; 8.0, blood glucose over 200)  If you have one of these conditions, contact your surgeon for an antibiotic prescription, prior to your dental procedure. Orthopedic Discharge Summary        Physician Discharge Summary  Patient ID: Steven Newman MRN: [id number] DOB/AGE: October 20, 1941 80 y.o.  Admit date: 08/07/2021 Discharge date: 08/11/2021   Procedures:  Procedure(s) (LRB): REVERSE SHOULDER ARTHROPLASTY (Left)  Attending Physician:  Dr. Esmond Plants  Admission Diagnoses:   left shoulder cuff arthropathy  Discharge Diagnoses:  left shoulder cuff arthropathy   Past Medical History:  Diagnosis Date   CAD (coronary artery disease)    coronary angioplasty 1995   COPD (chronic obstructive pulmonary disease) ([place])    Dyspnea    Erectile dysfunction    History of bladder cancer    Hx of colonic polyps    Hyperlipidemia    Hypertension    Kidney stones    MI (myocardial infarction) ([place])    1976, 1985   Obesity    Pneumonia    Skin cancer    Type 2 diabetes mellitus ([place])    Wheezing     PCP: Leeroy Cha, MD   Discharged Condition: good  Hospital Course:  Patient underwent the above stated procedure on 08/07/2021. Patient tolerated the procedure well and brought to the recovery room in good condition and subsequently to the floor. Patient had an uncomplicated hospital course and was stable for discharge.   Disposition: Discharge disposition: 01-Home or Self Care      with follow up in 2 weeks    Follow-up Information     Netta Cedars, MD. Call in 2 week(s).   Specialty: Orthopedic Surgery Why: [phone] Contact  information: [address] [place] [address] [id number]                 Dental Antibiotics:  In most cases prophylactic antibiotics for Dental procdeures after total joint surgery are not necessary.  Exceptions are as follows:  1. History of prior total joint infection  2. Severely immunocompromised (Organ Transplant, cancer chemotherapy, Rheumatoid biologic meds such as [place])  3. Poorly controlled diabetes (A1C &gt; 8.0, blood glucose over 200)  If you have one of these conditions, contact your surgeon for an antibiotic prescription, prior to your dental procedure.  Discharge Instructions     Call MD / Call 911   Complete by: As directed    If you experience chest pain or shortness of breath, CALL 911 and be transported to the hospital emergency room.  If you develope a fever above 101 F, pus (white drainage) or increased drainage or redness at the wound, or calf pain, call your surgeon's office.   Constipation Prevention   Complete by: As directed    Drink plenty of fluids.  Prune juice may be helpful.  You may use a stool softener, such as Colace (over the counter) 100 mg twice a day.  Use MiraLax (over the counter) for constipation as needed.   Diet - low sodium heart healthy   Complete by: As directed    Driving restrictions   Complete by: As directed    No driving for 4 weeks   Increase activity  slowly as tolerated   Complete by: As directed    Lifting restrictions   Complete by: As directed    No lifting for 4 weeks   Post-operative opioid taper instructions:   Complete by: As directed    POST-OPERATIVE OPIOID TAPER INSTRUCTIONS: It is important to wean off of your opioid medication as soon as possible. If you do not need pain medication after your surgery it is ok to stop day one. Opioids include: Codeine, Hydrocodone(Norco, Vicodin), Oxycodone(Percocet, oxycontin) and hydromorphone amongst others.  Long term and even short term  use of opiods can cause: Increased pain response Dependence Constipation Depression Respiratory depression And more.  Withdrawal symptoms can include Flu like symptoms Nausea, vomiting And more Techniques to manage these symptoms Hydrate well Eat regular healthy meals Stay active Use relaxation techniques(deep breathing, meditating, yoga) Do Not substitute Alcohol to help with tapering If you have been on opioids for less than two weeks and do not have pain than it is ok to stop all together.  Plan to wean off of opioids This plan should start within one week post op of your joint replacement. Maintain the same interval or time between taking each dose and first decrease the dose.  Cut the total daily intake of opioids by one tablet each day Next start to increase the time between doses. The last dose that should be eliminated is the evening dose.          Allergies as of 08/08/2021       Reactions   Ace Inhibitors    Angioedema   Amoxicillin-pot Clavulanate Other (See Comments)   Penicillins Swelling, Rash   Lips swell        Medication List     TAKE these medications    albuterol (2.5 MG/3ML) 0.083% nebulizer solution Commonly known as: PROVENTIL Take 2.5 mg by nebulization every 6 (six) hours as needed for wheezing or shortness of breath.   Anoro Ellipta 62.5-25 MCG/INH Aepb Generic drug: umeclidinium-vilanterol Inhale 1 puff into the lungs daily.   Aspirin Low Dose 81 MG EC tablet Generic drug: aspirin TAKE 1 TABLET DAILY   atorvastatin 40 MG tablet Commonly known as: LIPITOR TAKE 1 TABLET BY MOUTH DAILY.   CO Q-10 PO Take 10 mLs by mouth daily.   Jardiance 25 MG Tabs tablet Generic drug: empagliflozin Take 25 mg by mouth daily.   mirabegron ER 25 MG Tb24 tablet Commonly known as: MYRBETRIQ Take 25 mg by mouth daily.   nadolol 40 MG tablet Commonly known as: CORGARD Take 1 tablet (40 mg total) by mouth daily.   nitroGLYCERIN 0.4 MG SL  tablet Commonly known as: NITROSTAT Place 1 tablet (0.4 mg total) under the tongue every 5 (five) minutes as needed for chest pain.   OSTEO BI-FLEX ONE PER DAY PO Take 2 tablets by mouth daily.   traMADol 50 MG tablet Commonly known as: Ultram Take 1 tablet (50 mg total) by mouth every 6 (six) hours as needed.   TURMERIC PO Take 1,000 mg daily by mouth.   Vitamin D-3 125 MCG (5000 UT) Tabs Take 5,000 Units by mouth daily.          Signed: Ventura Bruns 08/11/2021, 8:06 AM  [facility] is now [facility] [address], [place], [place], [place] [address] Phone: [place]

## 2021-08-14 NOTE — Discharge Summary (Signed)
In most cases prophylactic antibiotics for Dental procdeures after total joint surgery are not necessary.  Exceptions are as follows:  1. History of prior total joint infection  2. Severely immunocompromised (Organ Transplant, cancer chemotherapy, Rheumatoid biologic meds such as [place])  3. Poorly controlled diabetes (A1C &gt; 8.0, blood glucose over 200)  If you have one of these conditions, contact your surgeon for an antibiotic prescription, prior to your dental procedure. Orthopedic Discharge Summary        Physician Discharge Summary  Patient ID: Steven Newman MRN: [id number] DOB/AGE: [birth year] 80 y.o.  Admit date: 08/07/2021 Discharge date: 08/08/2021  Procedures:  Procedure(s) (LRB): REVERSE SHOULDER ARTHROPLASTY (Left)  Attending Physician:  Dr. Esmond Plants  Admission Diagnoses:   left shoulder OA  Discharge Diagnoses:  Left shoulder OA   Past Medical History:  Diagnosis Date   CAD (coronary artery disease)    coronary angioplasty 1995   COPD (chronic obstructive pulmonary disease) ([place])    Dyspnea    Erectile dysfunction    History of bladder cancer    Hx of colonic polyps    Hyperlipidemia    Hypertension    Kidney stones    MI (myocardial infarction) ([place])    1976, 1985   Obesity    Pneumonia    Skin cancer    Type 2 diabetes mellitus ([place])    Wheezing     PCP: Leeroy Cha, MD   Discharged Condition: good  Hospital Course:  Patient underwent the above stated procedure on 08/07/2021. Patient tolerated the procedure well and brought to the recovery room in good condition and subsequently to the floor. Patient had an uncomplicated hospital course and was stable for discharge.   Disposition: Discharge disposition: 01-Home or Self Care      with follow up in 2 weeks    Follow-up Information     Netta Cedars, MD. Call in 2 week(s).   Specialty: Orthopedic Surgery Why: [phone] Contact information: [address] [place] [address] [phone]                 Dental Antibiotics:  In most cases prophylactic antibiotics for Dental procdeures after total joint surgery are not necessary.  Exceptions are as follows:  1. History of prior total joint infection  2. Severely immunocompromised (Organ Transplant, cancer chemotherapy, Rheumatoid biologic meds such as [place])  3. Poorly controlled diabetes (A1C &gt; 8.0, blood glucose over 200)  If you have one of these conditions, contact your surgeon for an antibiotic prescription, prior to your dental procedure.  Discharge Instructions     Call MD / Call 911   Complete by: As directed    If you experience chest pain or shortness of breath, CALL 911 and be transported to the hospital emergency room.  If you develope a fever above 101 F, pus (white drainage) or increased drainage or redness at the wound, or calf pain, call your surgeon's office.   Constipation Prevention   Complete by: As directed    Drink plenty of fluids.  Prune juice may be helpful.  You may use a stool softener, such as Colace (over the counter) 100 mg twice a day.  Use MiraLax (over the counter) for constipation as needed.   Diet - low sodium heart healthy   Complete by: As directed    Driving restrictions   Complete by: As directed    No driving for 4 weeks   Increase activity slowly as tolerated  Complete by: As directed    Lifting restrictions   Complete by: As directed    No lifting for 4 weeks   Post-operative opioid taper instructions:   Complete by: As directed    POST-OPERATIVE OPIOID TAPER INSTRUCTIONS: It is important to wean off of your opioid medication as soon as possible. If you do not need pain medication after your surgery it is ok to stop day one. Opioids include: Codeine, Hydrocodone(Norco, Vicodin), Oxycodone(Percocet, oxycontin) and hydromorphone amongst others.  Long term and even short term use of opiods can  cause: Increased pain response Dependence Constipation Depression Respiratory depression And more.  Withdrawal symptoms can include Flu like symptoms Nausea, vomiting And more Techniques to manage these symptoms Hydrate well Eat regular healthy meals Stay active Use relaxation techniques(deep breathing, meditating, yoga) Do Not substitute Alcohol to help with tapering If you have been on opioids for less than two weeks and do not have pain than it is ok to stop all together.  Plan to wean off of opioids This plan should start within one week post op of your joint replacement. Maintain the same interval or time between taking each dose and first decrease the dose.  Cut the total daily intake of opioids by one tablet each day Next start to increase the time between doses. The last dose that should be eliminated is the evening dose.          Allergies as of 08/08/2021       Reactions   Ace Inhibitors    Angioedema   Amoxicillin-pot Clavulanate Other (See Comments)   Penicillins Swelling, Rash   Lips swell        Medication List     TAKE these medications    albuterol (2.5 MG/3ML) 0.083% nebulizer solution Commonly known as: PROVENTIL Take 2.5 mg by nebulization every 6 (six) hours as needed for wheezing or shortness of breath.   Anoro Ellipta 62.5-25 MCG/INH Aepb Generic drug: umeclidinium-vilanterol Inhale 1 puff into the lungs daily.   Aspirin Low Dose 81 MG EC tablet Generic drug: aspirin TAKE 1 TABLET DAILY   atorvastatin 40 MG tablet Commonly known as: LIPITOR TAKE 1 TABLET BY MOUTH DAILY.   CO Q-10 PO Take 10 mLs by mouth daily.   Jardiance 25 MG Tabs tablet Generic drug: empagliflozin Take 25 mg by mouth daily.   mirabegron ER 25 MG Tb24 tablet Commonly known as: MYRBETRIQ Take 25 mg by mouth daily.   nadolol 40 MG tablet Commonly known as: CORGARD Take 1 tablet (40 mg total) by mouth daily.   nitroGLYCERIN 0.4 MG SL tablet Commonly  known as: NITROSTAT Place 1 tablet (0.4 mg total) under the tongue every 5 (five) minutes as needed for chest pain.   OSTEO BI-FLEX ONE PER DAY PO Take 2 tablets by mouth daily.   traMADol 50 MG tablet Commonly known as: Ultram Take 1 tablet (50 mg total) by mouth every 6 (six) hours as needed.   TURMERIC PO Take 1,000 mg daily by mouth.   Vitamin D-3 125 MCG (5000 UT) Tabs Take 5,000 Units by mouth daily.          Signed: Augustin Schooling 08/14/2021, 1:24 PM  [facility] is now [employer] [address], [place], [place], [place] [address] Phone: [place]

## 2021-08-19 ENCOUNTER — Encounter: Payer: Self-pay | Admitting: Counselor

## 2021-08-19 ENCOUNTER — Encounter: Payer: Self-pay | Admitting: Physician Assistant

## 2021-08-19 DIAGNOSIS — Z4789 Encounter for other orthopedic aftercare: Secondary | ICD-10-CM | POA: Diagnosis not present

## 2021-08-24 ENCOUNTER — Telehealth: Payer: Self-pay | Admitting: Pulmonary Disease

## 2021-08-25 ENCOUNTER — Other Ambulatory Visit: Payer: Self-pay

## 2021-08-25 ENCOUNTER — Encounter: Payer: Self-pay | Admitting: Physician Assistant

## 2021-08-25 ENCOUNTER — Ambulatory Visit (INDEPENDENT_AMBULATORY_CARE_PROVIDER_SITE_OTHER): Payer: Medicare Other | Admitting: Physician Assistant

## 2021-08-25 ENCOUNTER — Telehealth: Payer: Self-pay | Admitting: Physician Assistant

## 2021-08-25 VITALS — BP 140/80 | HR 63 | Ht 69.0 in | Wt 209.8 lb

## 2021-08-25 DIAGNOSIS — R600 Localized edema: Secondary | ICD-10-CM | POA: Diagnosis not present

## 2021-08-25 DIAGNOSIS — I2510 Atherosclerotic heart disease of native coronary artery without angina pectoris: Secondary | ICD-10-CM | POA: Diagnosis not present

## 2021-08-25 DIAGNOSIS — I10 Essential (primary) hypertension: Secondary | ICD-10-CM

## 2021-08-25 DIAGNOSIS — E119 Type 2 diabetes mellitus without complications: Secondary | ICD-10-CM

## 2021-08-25 DIAGNOSIS — E785 Hyperlipidemia, unspecified: Secondary | ICD-10-CM

## 2021-08-25 DIAGNOSIS — I739 Peripheral vascular disease, unspecified: Secondary | ICD-10-CM

## 2021-08-25 MED ORDER — ANORO ELLIPTA 62.5-25 MCG/INH IN AEPB: 1.0000 | INHALATION_SPRAY | Freq: Every day | RESPIRATORY_TRACT | 0 refills | Status: DC

## 2021-08-25 MED ORDER — FUROSEMIDE 20 MG PO TABS: 20.0000 mg | ORAL_TABLET | ORAL | 0 refills | Status: DC | PRN

## 2021-08-25 NOTE — Telephone Encounter (Signed)
Called patient's pharmacy and spoke with Caryl Pina. I informed her that the Rx is for Lasix 20 mg as needed and that patient was instructed to take 1 tablet for 3 days and on an as needed basis. She thanked me for calling to verify Rx.

## 2021-08-25 NOTE — Patient Instructions (Addendum)
Medication Instructions:  TAKE Lasix 20 mg daily for 3 days then only take as needed  *If you need a refill on your cardiac medications before your next appointment, please call your pharmacy*  Lab Work: NONE ordered at this time of appointment   If you have labs (blood work) drawn today and your tests are completely normal, you will receive your results only by: [place] (if you have MyChart) OR A paper copy in the mail If you have any lab test that is abnormal or we need to change your treatment, we will call you to review the results.  Testing/Procedures: Your physician has requested that you have an echocardiogram. Echocardiography is a painless test that uses sound waves to create images of your heart. It provides your doctor with information about the size and shape of your heart and how well your heart's chambers and valves are working. This procedure takes approximately one hour. There are no restrictions for this procedure.  Please schedule for 4-5 weeks at [facility] office prior to follow up   Follow-Up: At [facility], you and your health needs are our priority.  As part of our continuing mission to provide you with exceptional heart care, we have created designated Provider Care Teams.  These Care Teams include your primary Cardiologist (physician) and Advanced Practice Providers (APPs -  Physician Assistants and Nurse Practitioners) who all work together to provide you with the care you need, when you need it.  Your next appointment:   4-6 week(s)  The format for your next appointment:   In Person  Provider:   Casandra Doffing, MD or APP  Other Instructions

## 2021-08-25 NOTE — Telephone Encounter (Signed)
Follow up:     Patient daughter calling to check the status of her last message this morning.

## 2021-08-25 NOTE — Telephone Encounter (Signed)
Pt c/o medication issue:  1. Name of Medication:  furosemide (LASIX) 20 MG tablet  2. How are you currently taking this medication (dosage and times per day)?    3. Are you having a reaction (difficulty breathing--STAT)?    4. What is your medication issue?   Caryl Pina with [facility] states they received the Rx for Furosemide. Instructions state take 1 tablet by mouth as needed. She is requesting more specific instructions. Please return call to clarify.

## 2021-08-25 NOTE — Telephone Encounter (Signed)
Called and spoke with pts wife and she stated that they are waiting for the approval for the patient assistance for the anoro.  Something else had to be filled out and sent in.  I have left 3 other samples up front for her to come by and pick up.  She did not want him to run out while they are waiting.

## 2021-08-25 NOTE — Progress Notes (Signed)
Cardiology Office Note:    Date:  08/26/2021   ID:  Steven Newman, DOB 06/12/1941, MRN [id number]  PCP:  Leeroy Cha, MD   [facility] Providers Cardiologist:  Larae Grooms, MD { PV specialist: Dr. Fletcher Anon  Referring MD: Leeroy Cha,*   Chief Complaint  Patient presents with   Follow-up    Seen for Dr. Irish Lack    History of Present Illness:    Steven Newman is a 80 y.o. male with a hx of CAD, HTN, HLD, DM2, COPD, AAA, history of PAD followed by Dr. Fletcher Anon.  He has known abdominal aortic ectasia and a significant stenosis affecting the left external iliac artery, he was treated medically due to lack of claudication symptoms.  He follows pulmonary service for COPD/emphysema.  He had MI in 1976 and in 1985 and underwent coronary angioplasty in 1995.  Last echocardiogram obtained on 08/09/2018 showed EF 50 to 55%, trivial AI, mild MR. Most recent lower extremity arterial Doppler obtained in February 2022 showed 75 to 99% stenosis in the right proximal popliteal artery, 75 to 99% left CFA stenosis.  Right ABI 0.74, left ABI 0.93.  He was seen by Dr. Fletcher Anon on 02/10/2021, at which time he denies any claudication symptoms.  He is limited by shortness of breath but no chest pain.  Patient presents today along with family regarding lower extremity edema that started during the hospitalization for his shoulder repair.  Since discharge, he has resumed his normal level activity.  On physical exam, he has 2-3+ right ankle edema and 1-2+ left ankle edema.  His lung is clear.  He denies any recent chest pain or worsening dyspnea.  He denies any orthopnea or PND.  I will give him 20 mg daily of Lasix to use for the first 3 days before changing to as needed.  I do not think the patient necessarily need a scheduled diuretic at this time.  Given the recent volume accumulation, I recommended repeat echocardiogram to make sure his ejection fraction is stable.  Otherwise, my suspicion of lower  extremity DVT is fairly low despite the right lower extremity edema worse than the left side.  I plan to have the patient come back for reassessment in 4 to 6 weeks.  Recent lab work obtained on 08/08/2021 showed creatinine 1.36, potassium 4.1.  I did not repeat a basic metabolic panel since the Lasix was only supposed to be as needed.  Past Medical History:  Diagnosis Date   CAD (coronary artery disease)    coronary angioplasty 1995   COPD (chronic obstructive pulmonary disease) (HCC)    Dyspnea    Erectile dysfunction    History of bladder cancer    Hx of colonic polyps    Hyperlipidemia    Hypertension    Kidney stones    MI (myocardial infarction) ([place])    1976, 1985   Obesity    Pneumonia    Skin cancer    Type 2 diabetes mellitus (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   ASPIRIN LOW DOSE 81 MG EC tablet TAKE 1 TABLET DAILY   atorvastatin (LIPITOR) 40 MG tablet TAKE 1 TABLET BY MOUTH DAILY.   Boswellia-Glucosamine-Vit D (OSTEO BI-FLEX ONE PER DAY PO) Take 2 tablets by mouth daily.   Cholecalciferol (VITAMIN D-3) 125 MCG (5000 UT) TABS Take 5,000 Units by mouth daily.   Coenzyme Q10 (CO Q-10 PO) Take 10 mLs by mouth daily.   furosemide (LASIX) 20 MG tablet Take 1 tablet (20 mg total) by mouth as needed.   JARDIANCE 25 MG TABS tablet Take 25 mg by mouth daily.   mirabegron ER (MYRBETRIQ) 25 MG TB24 tablet Take 25 mg by mouth daily.   nadolol  (CORGARD) 40 MG tablet Take 1 tablet (40 mg total) by mouth daily.   nitroGLYCERIN (NITROSTAT) 0.4 MG SL tablet Place 1 tablet (0.4 mg total) under the tongue every 5 (five) minutes as needed for chest pain.   TURMERIC PO Take 1,000 mg daily by mouth.   umeclidinium-vilanterol (ANORO ELLIPTA) 62.5-25 MCG/INH AEPB Inhale 1 puff into the lungs daily.     Allergies:   Ace inhibitors, Amoxicillin-pot clavulanate, and Penicillins   Social History   Socioeconomic History   Marital status: Married    Spouse name: Not on file   Number of children: Not on file   Years of education: Not on file   Highest education level: Not on file  Occupational History   Occupation: Retired  Tobacco Use   Smoking status: Former    Packs/day: 2.00    Years: 50.00    Pack years: 100.00    Types: Cigarettes    Quit date: 06/20/2015    Years since quitting: 6.1   Smokeless tobacco: Never  Vaping Use   Vaping Use: Never used  Substance and Sexual Activity   Alcohol use: Yes    Alcohol/week: 0.0 standard drinks    Comment: socially   Drug use: No   Sexual activity: Not on file  Other Topics Concern   Not on file  Social History Narrative   Right handed   Drinks caffeine   Two story home   Social Determinants of Health   Financial Resource Strain: Not on file  Food Insecurity: Not on file  Transportation Needs: Not on file  Physical Activity: Not on file  Stress: Not on file  Social Connections: Not on file     Family History: The patient's family history includes Cancer in his sister; Diabetes in his sister; Heart disease in his mother; Rheumatic fever in his brother and father.  ROS:   Please see the history of present illness.     All other systems reviewed and are negative.  EKGs/Labs/Other Studies Reviewed:    The following studies were reviewed today:  Echo 08/09/2018 LV EF: 50% -   55%  Study Conclusions   - Left ventricle: Septal apical and inferior basal hypokinesis.    Wall  thickness was increased in a pattern of severe LVH. Systolic    function was normal. The estimated ejection fraction was in the    range of 50% to 55%. Left ventricular diastolic function    parameters were normal.  - Aortic valve: There was trivial regurgitation.  - Mitral valve: There was mild regurgitation.  - Left atrium: The atrium was moderately dilated.   EKG:  EKG is not ordered  today.   Recent Labs: 01/02/2021: TSH 3.11 07/21/2021: Platelets 115 08/08/2021: BUN 27; Creatinine, Ser 1.36; Hemoglobin 13.8; Potassium 4.1; Sodium 135  Recent Lipid Panel    Component Value Date/Time   CHOL 159 12/23/2014 0923   TRIG 129 12/23/2014 0923   HDL 45 12/23/2014 0923   LDLCALC 88 12/23/2014 0923     Risk Assessment/Calculations:           Physical Exam:    VS:  BP 140/80 (BP Location: Right Arm)   Pulse 63   Ht 5\' 9"  (1.753 m)   Wt 209 lb 12.8 oz (95.2 kg)   SpO2 96%   BMI 30.98 kg/m     Wt Readings from Last 3 Encounters:  08/25/21 209 lb 12.8 oz (95.2 kg)  08/07/21 205 lb 11 oz (93.3 kg)  07/21/21 205 lb 9.6 oz (93.3 kg)     GEN:  Well nourished, well developed in no acute distress HEENT: Normal NECK: No JVD; No carotid bruits LYMPHATICS: No lymphadenopathy CARDIAC: RRR, no murmurs, rubs, gallops RESPIRATORY:  Clear to auscultation without rales, wheezing or rhonchi  ABDOMEN: Soft, non-tender, non-distended MUSCULOSKELETAL:  No edema; No deformity  SKIN: Warm and dry NEUROLOGIC:  Alert and oriented x 3 PSYCHIATRIC:  Normal affect   ASSESSMENT:    1. Bilateral leg edema   2. Coronary artery disease involving native coronary artery of native heart without angina pectoris   3. Primary hypertension   4. Hyperlipidemia LDL goal <70   5. Controlled type 2 diabetes mellitus without complication, without long-term current use of insulin ([place])   6. PAD (peripheral artery disease) (HCC)    PLAN:    In order of problems listed above:  Bilateral lower extremity  edema: I recommended 20 mg Lasix for 3 days before changing it to as needed.  Continue leg elevation and salt restriction.  CAD: Denies any recent chest pain.  Given the presence of lower extremity edema, I recommended echocardiogram  Hypertension: Blood pressure mildly elevated.  Continue on the current therapy for now.  Hyperlipidemia: On Lipitor  DM2: Managed by primary care provider  PAD: Denies any claudication symptoms.    Medication Adjustments/Labs and Tests Ordered: Current medicines are reviewed at length with the patient today.  Concerns regarding medicines are outlined above.  Orders Placed This Encounter  Procedures   ECHOCARDIOGRAM COMPLETE   Meds ordered this encounter  Medications   furosemide (LASIX) 20 MG tablet    Sig: Take 1 tablet (20 mg total) by mouth as needed.    Dispense:  30 tablet    Refill:  0    Patient Instructions  Medication Instructions:  TAKE Lasix 20 mg daily for 3 days then only take as needed  *If you need a refill on your cardiac medications before your next appointment, please call your pharmacy*  Lab Work: NONE ordered at this time of appointment   If you have labs (blood work) drawn today and your tests are completely normal, you will receive your results only by: [place] (if you have MyChart) OR A paper copy in the mail If you have any lab test that is abnormal or we need to change your treatment, we will call you to review the results.  Testing/Procedures: Your physician has requested that you have an echocardiogram. Echocardiography is a painless test that uses sound waves to create images of your heart. It provides your doctor with information about the size and shape of your heart and how well  your heart's chambers and valves are working. This procedure takes approximately one hour. There are no restrictions for this procedure.  Please schedule for 4-5 weeks at [facility] office prior to follow up   Follow-Up: At  [facility] you and your health needs are our priority.  As part of our continuing mission to provide you with exceptional heart care, we have created designated Provider Care Teams.  These Care Teams include your primary Cardiologist (physician) and Advanced Practice Providers (APPs -  Physician Assistants and Nurse Practitioners) who all work together to provide you with the care you need, when you need it.  Your next appointment:   4-6 week(s)  The format for your next appointment:   In Person  Provider:   Casandra Doffing, MD or APP  Other Instructions    Signed, Almyra Deforest, [place]  08/26/2021 11:10 PM    [place]

## 2021-08-26 ENCOUNTER — Encounter: Payer: Self-pay | Admitting: Physician Assistant

## 2021-08-26 ENCOUNTER — Telehealth: Payer: Self-pay | Admitting: Pulmonary Disease

## 2021-08-27 DIAGNOSIS — M150 Primary generalized (osteo)arthritis: Secondary | ICD-10-CM | POA: Diagnosis not present

## 2021-08-27 DIAGNOSIS — I2510 Atherosclerotic heart disease of native coronary artery without angina pectoris: Secondary | ICD-10-CM | POA: Diagnosis not present

## 2021-08-27 DIAGNOSIS — E7800 Pure hypercholesterolemia, unspecified: Secondary | ICD-10-CM | POA: Diagnosis not present

## 2021-08-27 DIAGNOSIS — E1169 Type 2 diabetes mellitus with other specified complication: Secondary | ICD-10-CM | POA: Diagnosis not present

## 2021-08-27 DIAGNOSIS — N183 Chronic kidney disease, stage 3 unspecified: Secondary | ICD-10-CM | POA: Diagnosis not present

## 2021-08-27 DIAGNOSIS — J449 Chronic obstructive pulmonary disease, unspecified: Secondary | ICD-10-CM | POA: Diagnosis not present

## 2021-08-27 DIAGNOSIS — I252 Old myocardial infarction: Secondary | ICD-10-CM | POA: Diagnosis not present

## 2021-08-27 DIAGNOSIS — I10 Essential (primary) hypertension: Secondary | ICD-10-CM | POA: Diagnosis not present

## 2021-08-27 NOTE — Telephone Encounter (Signed)
I called and spoke with samantha at [facility] regarding prescription. I explained that patient has not been seen in office for over a year and has an appt next month. We can then send off a prescription for Anoro. I did explain that we did give patient 3 samples of Anoro that will get him to that point. Samantha verbalized understanding, nothing further needed.

## 2021-09-04 ENCOUNTER — Encounter: Payer: Self-pay | Admitting: Podiatry

## 2021-09-04 ENCOUNTER — Other Ambulatory Visit: Payer: Self-pay

## 2021-09-04 ENCOUNTER — Ambulatory Visit (INDEPENDENT_AMBULATORY_CARE_PROVIDER_SITE_OTHER): Payer: Medicare Other | Admitting: Podiatry

## 2021-09-04 DIAGNOSIS — B351 Tinea unguium: Secondary | ICD-10-CM | POA: Diagnosis not present

## 2021-09-04 DIAGNOSIS — E1151 Type 2 diabetes mellitus with diabetic peripheral angiopathy without gangrene: Secondary | ICD-10-CM

## 2021-09-04 DIAGNOSIS — E1169 Type 2 diabetes mellitus with other specified complication: Secondary | ICD-10-CM

## 2021-09-04 NOTE — Progress Notes (Signed)
This patient returns to my office for at risk foot care.  This patient requires this care by a professional since this patient will be at risk due to having type 2 diabetes, CKD and swelling right foot. This patient is unable to cut nails himself since the patient cannot reach his nails.These nails are painful walking and wearing shoes.  This patient presents for at risk foot care today.  General Appearance  Alert, conversant and in no acute stress.  Vascular  Dorsalis pedis and posterior tibial  pulses are  weakly palpable  bilaterally.  Capillary return is within normal limits  bilaterally. Temperature is within normal limits  bilaterally.  Neurologic  Senn-Weinstein monofilament wire test within normal limits  bilaterally. Muscle power within normal limits bilaterally.  Nails Thick disfigured discolored nails with subungual debris  from hallux to fifth toes bilaterally. No evidence of bacterial infection or drainage bilaterally.  Orthopedic  No limitations of motion  feet .  No crepitus or effusions noted.  No bony pathology or digital deformities noted.  Swelling right foot.  Skin  normotropic skin with no porokeratosis noted bilaterally.  No signs of infections or ulcers noted.     Onychomycosis  Pain in right toes  Pain in left toes  Consent was obtained for treatment procedures.   Mechanical debridement of nails 1-5  bilaterally performed with a nail nipper.  Filed with dremel without incident.  Dsp.  Compression anklet.   Return office visit    3 months                  Told patient to return for periodic foot care and evaluation due to potential at risk complications.   Gardiner Barefoot DPM

## 2021-09-11 ENCOUNTER — Ambulatory Visit (HOSPITAL_COMMUNITY): Payer: Medicare Other | Attending: Physician Assistant

## 2021-09-11 ENCOUNTER — Other Ambulatory Visit: Payer: Self-pay

## 2021-09-11 DIAGNOSIS — R600 Localized edema: Secondary | ICD-10-CM | POA: Diagnosis not present

## 2021-09-11 LAB — ECHOCARDIOGRAM COMPLETE
Area-P 1/2: 2.83 cm2
S' Lateral: 5.5 cm

## 2021-09-14 NOTE — Progress Notes (Signed)
Cardiology Office Note   Date:  09/15/2021   ID:  Steven Newman, DOB 07-31-1941, MRN [id number]  PCP:  Leeroy Cha, MD    No chief complaint on file.  CAD  Wt Readings from Last 3 Encounters:  09/15/21 210 lb 9.6 oz (95.5 kg)  08/25/21 209 lb 12.8 oz (95.2 kg)  08/07/21 205 lb 11 oz (93.3 kg)       History of Present Illness: Steven Newman is a 80 y.o. male  with CAD with several MIs in the past and a PTCA in the mid 90s.    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  He has been treated medically due to lack of claudication.   In the past, He reported chronic exertional dyspnea which limits his activities.  He is actually more limited by this than anything else.     Hospitalized in [place] in 2017 for COPD.    Over the years, his memory has gotten worse.  Has had occasional dizzy spell when he stands up.    Followed by pulmonary for COPD/emphysema.  Has not used albuterol recently.  He had all three COVID shots and a flu shot.  No problems.    Denies : Chest pain. Dizziness. Nitroglycerin use. Orthopnea. Palpitations. Paroxysmal nocturnal dyspnea. Shortness of breath. Syncope.    Leg swelling is fairly new since his shoulder replacement surgery.   EF 45-50%.  Normal RV function.  Getting compression stockings.     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     ASPIRIN LOW DOSE 81 MG EC tablet TAKE 1 TABLET DAILY 90 tablet 3   atorvastatin (LIPITOR) 40 MG tablet TAKE 1 TABLET BY MOUTH DAILY. 90 tablet 3   Boswellia-Glucosamine-Vit D (OSTEO BI-FLEX ONE PER DAY PO) Take 2 tablets by mouth daily.     Cholecalciferol (VITAMIN D-3) 125 MCG (5000 UT) TABS Take 5,000 Units by mouth daily.     Coenzyme Q10 (CO Q-10 PO) Take 10 mLs by mouth daily.     JARDIANCE 25 MG TABS tablet Take 25 mg by mouth daily.     mirabegron ER (MYRBETRIQ) 25 MG TB24 tablet Take 25 mg by mouth daily.     nadolol (CORGARD) 40 MG tablet Take 1 tablet (  40 mg total) by mouth daily. 90 tablet 3   nitroGLYCERIN (NITROSTAT) 0.4 MG SL tablet Place 1 tablet (0.4 mg total) under the tongue every 5 (five) minutes as needed for chest pain. 25 tablet 3   TURMERIC PO Take 1,000 mg daily by mouth.     umeclidinium-vilanterol (ANORO ELLIPTA) 62.5-25 MCG/INH AEPB Inhale 1 puff into the lungs daily. 3 each 0   No current facility-administered medications for this visit.    Allergies:   Ace inhibitors, Amoxicillin-pot clavulanate, and Penicillins    Social History:  The patient  reports that he quit smoking about 6 years ago. His smoking use included cigarettes. He has a 100.00 pack-year smoking history. He has never used smokeless tobacco. He reports current alcohol use. He reports that he does not use drugs.   Family History:  The patient's family history includes Cancer in his sister; Diabetes in his sister; Heart disease in his mother; Rheumatic fever in his  brother and father.    ROS:  Please see the history of present illness.   Otherwise, review of systems are positive for foot swelling.   All other systems are reviewed and negative.    PHYSICAL EXAM: VS:  BP 120/64   Pulse (!) 59   Ht 5\' 9"  (1.753 m)   Wt 210 lb 9.6 oz (95.5 kg)   SpO2 94%   BMI 31.10 kg/m  , BMI Body mass index is 31.1 kg/m. GEN: Well nourished, well developed, in no acute distress HEENT: normal Neck: no JVD, carotid bruits, or masses Cardiac: RRR; no murmurs, rubs, or gallops,; 1+ pitting pedal edema  Respiratory:  clear to auscultation bilaterally, normal work of breathing GI: soft, nontender, nondistended, + BS MS: no deformity or atrophy; left arm in sling Skin: warm and dry, no rash Neuro:  Strength and sensation are intact Psych: euthymic mood, full affect   EKG:   The ekg ordered today demonstrates sinus brady, QRS widening, inferior Q waves   Recent Labs: 01/02/2021: TSH 3.11 07/21/2021: Platelets 115 08/08/2021: BUN 27; Creatinine, Ser 1.36; Hemoglobin 13.8; Potassium 4.1; Sodium 135   Lipid Panel    Component Value Date/Time   CHOL 159 12/23/2014 0923   TRIG 129 12/23/2014 0923   HDL 45 12/23/2014 0923   LDLCALC 88 12/23/2014 0923     Other studies Reviewed: Additional studies/ records that were reviewed today with results demonstrating: labs reviewed.  EF 45-50%   ASSESSMENT AND PLAN:  CAD/Old MI: No angina. COntinue aggressive secodary prevention.  Chronic systolic heart failure- stable.  No pulm edema.  Did well with shoulder replacement.  PAD: Not walking much. No claudication reported. LE edema likely related to venous insufficiency.  Increased walking may help. No sores on his feet.  Looking at getting compression stockings.  Elevate legs.  Normal IVC on echo so no evidence of CHF.  DM: A1C 6.5. Healthy diet. Managed by PMD.  HTN: The current medical regimen is effective;  continue present plan and medications. AAA: aortic  atherosclerosis. Continue atorvastatin.  CRI: check BMe.  Lasix 20 mg did not help swelling.   Current medicines are reviewed at length with the patient today.  The patient concerns regarding his medicines were addressed.  The following changes have been made:  No change  Labs/ tests ordered today include:  No orders of the defined types were placed in this encounter.   Recommend 150 minutes/week of aerobic exercise Low fat, low carb, high fiber diet recommended  Disposition:  FU in 1 year   Signed, Larae Grooms, MD  09/15/2021 2:48 PM    [address], [place], [place]  [address] Phone: [phone]; Fax: [phone]

## 2021-09-15 ENCOUNTER — Ambulatory Visit (INDEPENDENT_AMBULATORY_CARE_PROVIDER_SITE_OTHER): Payer: Medicare Other | Admitting: Interventional Cardiology

## 2021-09-15 ENCOUNTER — Other Ambulatory Visit: Payer: Self-pay

## 2021-09-15 ENCOUNTER — Encounter: Payer: Self-pay | Admitting: Interventional Cardiology

## 2021-09-15 VITALS — BP 120/64 | HR 59 | Ht 69.0 in | Wt 210.6 lb

## 2021-09-15 DIAGNOSIS — E785 Hyperlipidemia, unspecified: Secondary | ICD-10-CM

## 2021-09-15 DIAGNOSIS — I252 Old myocardial infarction: Secondary | ICD-10-CM

## 2021-09-15 DIAGNOSIS — I739 Peripheral vascular disease, unspecified: Secondary | ICD-10-CM | POA: Diagnosis not present

## 2021-09-15 DIAGNOSIS — E119 Type 2 diabetes mellitus without complications: Secondary | ICD-10-CM | POA: Diagnosis not present

## 2021-09-15 DIAGNOSIS — I7143 Infrarenal abdominal aortic aneurysm, without rupture: Secondary | ICD-10-CM | POA: Diagnosis not present

## 2021-09-15 DIAGNOSIS — I10 Essential (primary) hypertension: Secondary | ICD-10-CM | POA: Diagnosis not present

## 2021-09-15 DIAGNOSIS — I2510 Atherosclerotic heart disease of native coronary artery without angina pectoris: Secondary | ICD-10-CM

## 2021-09-15 NOTE — Patient Instructions (Signed)
Medication Instructions:  Your physician recommends that you continue on your current medications as directed. Please refer to the Current Medication list given to you today.  *If you need a refill on your cardiac medications before your next appointment, please call your pharmacy*   Lab Work: Lab work to be done today--BMP If you have labs (blood work) drawn today and your tests are completely normal, you will receive your results only by: [place] (if you have MyChart) OR A paper copy in the mail If you have any lab test that is abnormal or we need to change your treatment, we will call you to review the results.   Testing/Procedures: none   Follow-Up: At [facility], you and your health needs are our priority.  As part of our continuing mission to provide you with exceptional heart care, we have created designated Provider Care Teams.  These Care Teams include your primary Cardiologist (physician) and Advanced Practice Providers (APPs -  Physician Assistants and Nurse Practitioners) who all work together to provide you with the care you need, when you need it.  We recommend signing up for the patient portal called "MyChart".  Sign up information is provided on this After Visit Summary.  MyChart is used to connect with patients for Virtual Visits (Telemedicine).  Patients are able to view lab/test results, encounter notes, upcoming appointments, etc.  Non-urgent messages can be sent to your provider as well.   To learn more about what you can do with MyChart, go to [email or web address].    Your next appointment:   12 month(s)  The format for your next appointment:   In Person  Provider:   You may see Larae Grooms, MD or one of the following Advanced Practice Providers on your designated Care Team:   Melina Copa, PA-C Ermalinda Barrios, PA-C   Other Instructions

## 2021-09-16 ENCOUNTER — Ambulatory Visit (INDEPENDENT_AMBULATORY_CARE_PROVIDER_SITE_OTHER): Payer: Medicare Other

## 2021-09-16 ENCOUNTER — Ambulatory Visit (INDEPENDENT_AMBULATORY_CARE_PROVIDER_SITE_OTHER): Payer: Medicare Other | Admitting: Pulmonary Disease

## 2021-09-16 ENCOUNTER — Encounter: Payer: Self-pay | Admitting: Pulmonary Disease

## 2021-09-16 VITALS — BP 122/76 | HR 59 | Temp 97.6°F | Ht 69.0 in | Wt 209.6 lb

## 2021-09-16 DIAGNOSIS — I2510 Atherosclerotic heart disease of native coronary artery without angina pectoris: Secondary | ICD-10-CM

## 2021-09-16 DIAGNOSIS — J449 Chronic obstructive pulmonary disease, unspecified: Secondary | ICD-10-CM

## 2021-09-16 DIAGNOSIS — J439 Emphysema, unspecified: Secondary | ICD-10-CM | POA: Diagnosis not present

## 2021-09-16 DIAGNOSIS — Z4789 Encounter for other orthopedic aftercare: Secondary | ICD-10-CM | POA: Diagnosis not present

## 2021-09-16 LAB — BASIC METABOLIC PANEL
BUN/Creatinine Ratio: 24 (ref 10–24)
BUN: 31 mg/dL — ABNORMAL HIGH (ref 8–27)
CO2: 18 mmol/L — ABNORMAL LOW (ref 20–29)
Calcium: 10 mg/dL (ref 8.6–10.2)
Chloride: 107 mmol/L — ABNORMAL HIGH (ref 96–106)
Creatinine, Ser: 1.31 mg/dL — ABNORMAL HIGH (ref 0.76–1.27)
Glucose: 128 mg/dL — ABNORMAL HIGH (ref 70–99)
Potassium: 4.7 mmol/L (ref 3.5–5.2)
Sodium: 142 mmol/L (ref 134–144)
eGFR: 54 mL/min/{1.73_m2} — ABNORMAL LOW (ref >59)

## 2021-09-16 IMAGING — DX DG CHEST 2V
2 series · 2 of 2 positions shown · non-contrast
Comparison: 07/29/2020

CLINICAL DATA: 80-year-old male with a history of emphysema

EXAM:
CHEST - 2 VIEW

[chest pa]
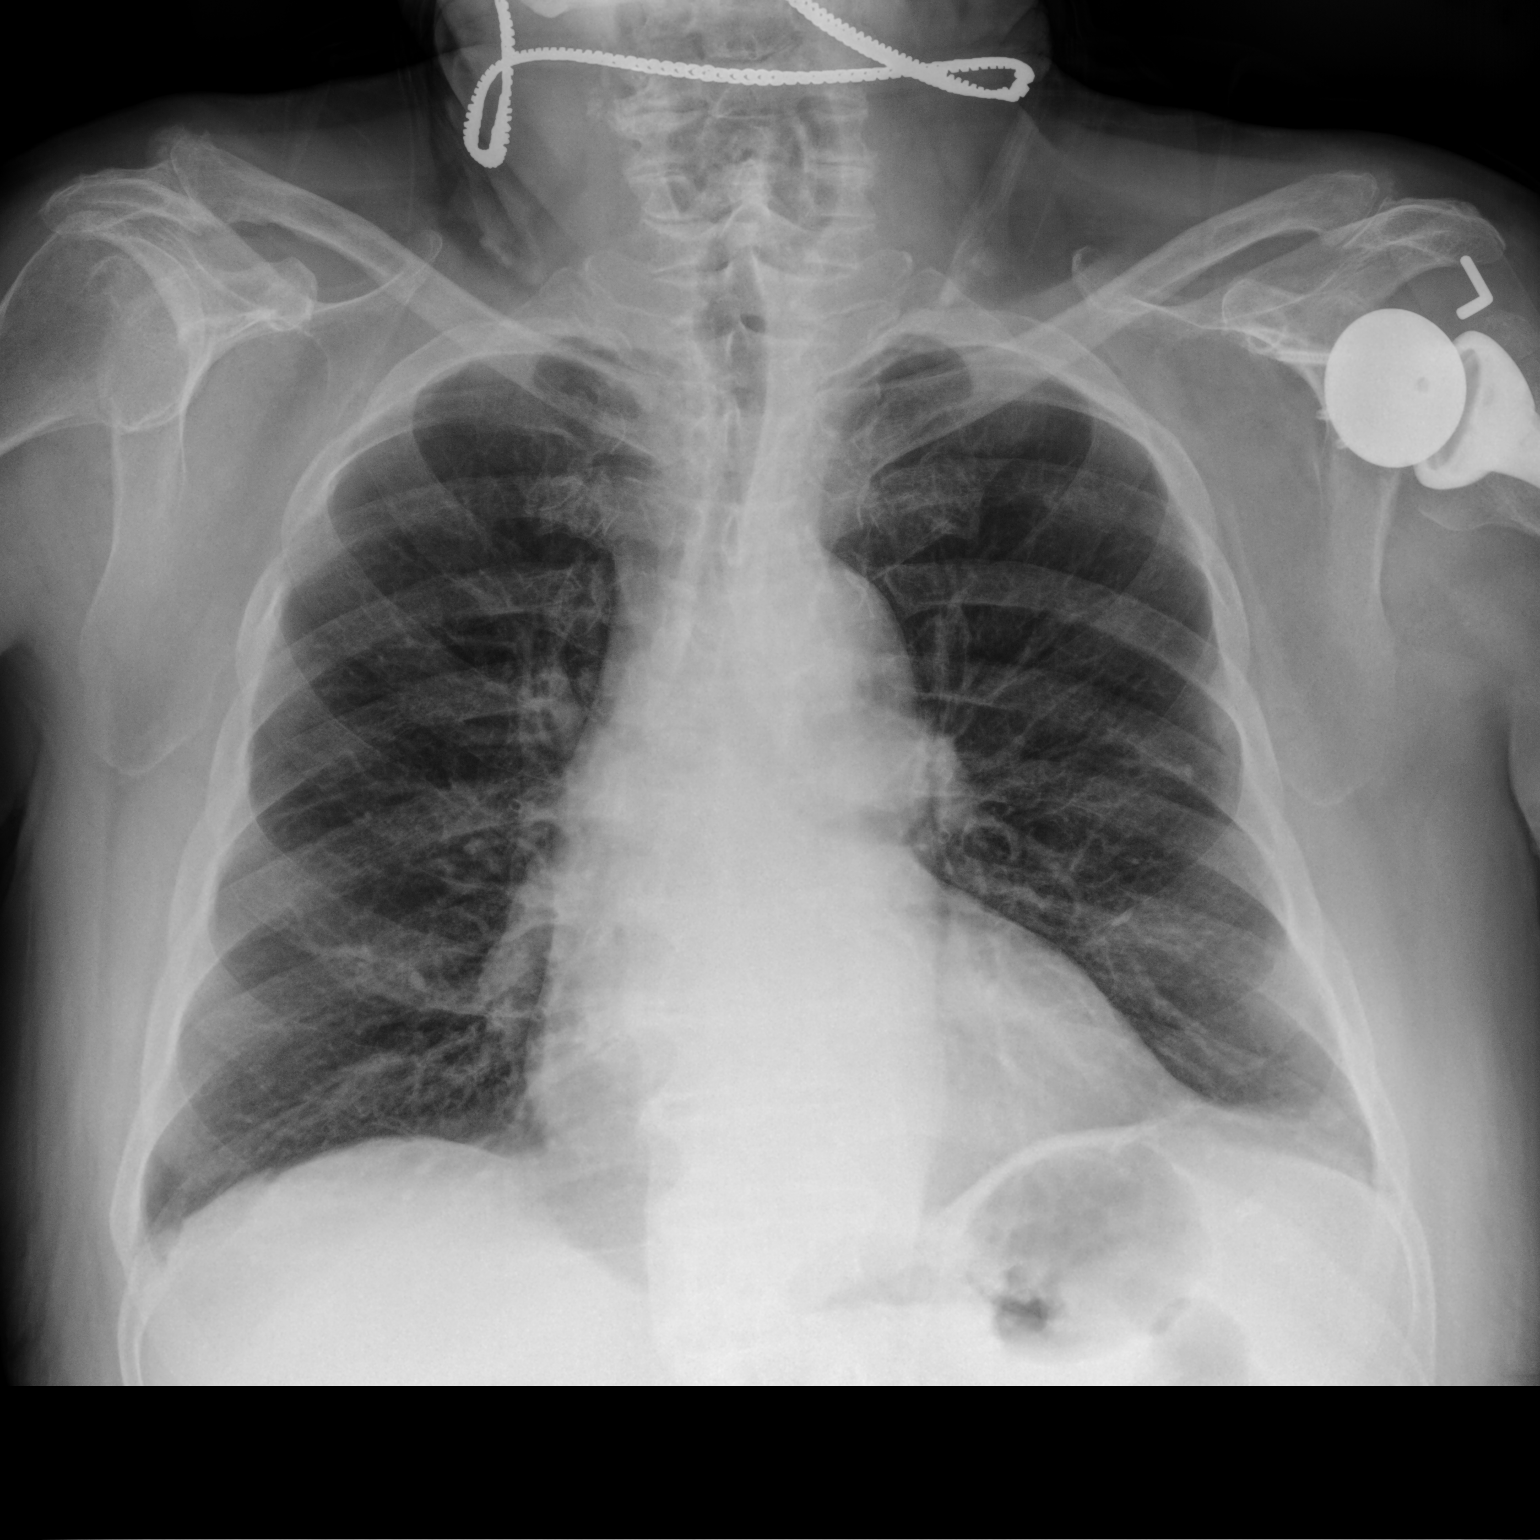

[chest lat]
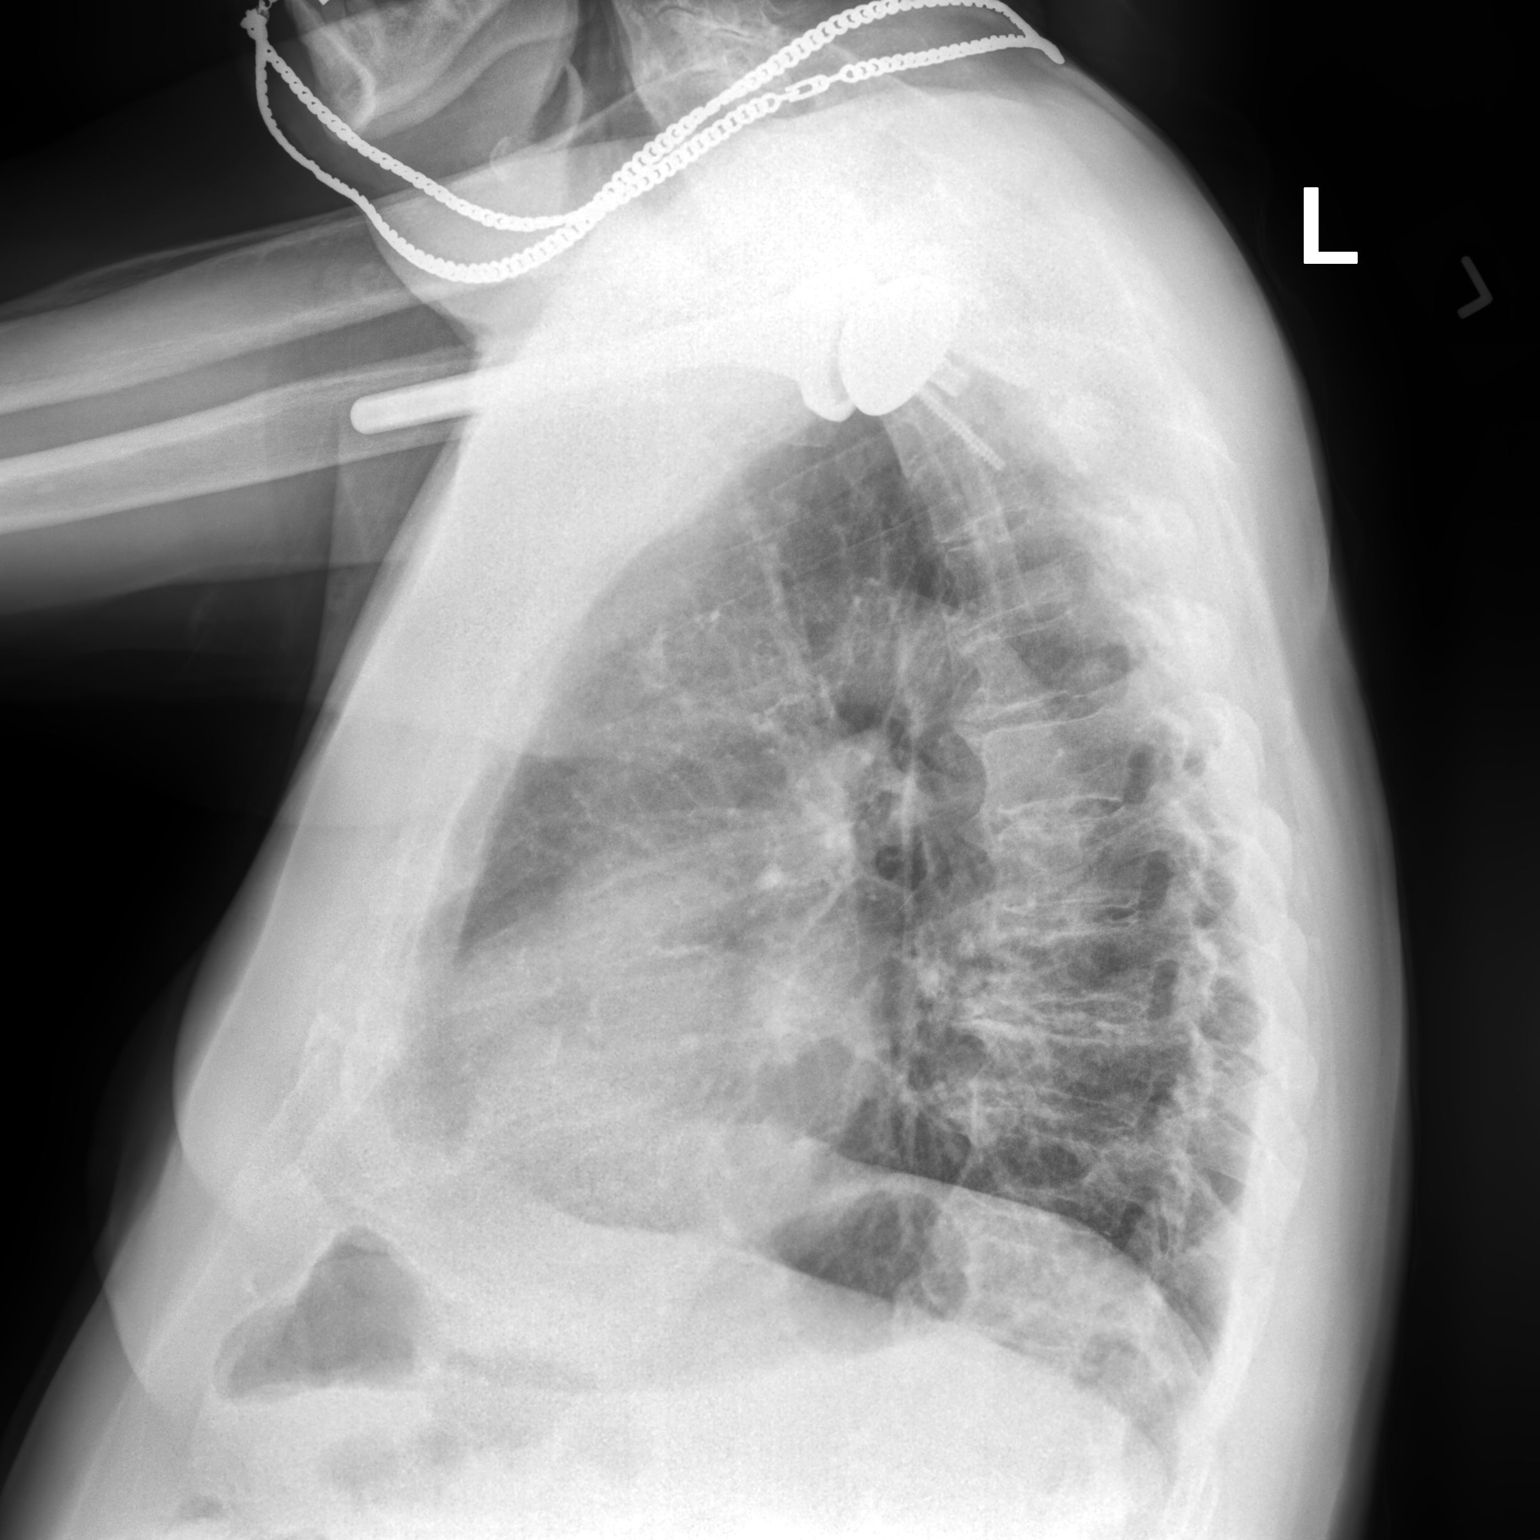

[2 of 2 positions shown; findings below may reference images not displayed]

FINDINGS: Cardiomediastinal silhouette unchanged in size and contour. No
evidence of central vascular congestion. No interlobular septal
thickening.

Stigmata of emphysema, with increased retrosternal airspace,
flattened hemidiaphragms, increased AP diameter, and hyperinflation
on the AP view.

No pneumothorax or pleural effusion. Coarsened interstitial
markings, with no confluent airspace disease.

No acute displaced fracture. Degenerative changes of the spine.

Surgical changes of the left glenohumeral joint
IMPRESSION: Emphysema without evidence of acute cardiopulmonary disease

## 2021-09-16 MED ORDER — ANORO ELLIPTA 62.5-25 MCG/INH IN AEPB: 1.0000 | INHALATION_SPRAY | Freq: Every day | RESPIRATORY_TRACT | 0 refills | Status: DC

## 2021-09-16 NOTE — Addendum Note (Signed)
Addended by: Konrad Felix L on: 09/16/2021 02:54 PM   Modules accepted: Orders

## 2021-09-16 NOTE — Progress Notes (Signed)
Steven Newman    [id number]    1941/07/28  Primary Care Physician:Varadarajan, Ronie Spies, MD  Referring Physician: Leeroy Cha, MD [address] [place],  [place] [id number]  Chief complaint: Follow-up for COPD  HPI: 80 year old ex-smoker.  Previous patient of Dr. Lake Bells Has history of COPD, diabetes, coronary artery disease.  Maintained on Anoro with stable symptoms.  Has mild dyspnea on exertion, chronic cough with white mucus.  Pets: Cats Occupation: Used to Engineer, maintenance.  Was previously in the [place] in [place] Exposures: Exposure to agent orange in the past.  No ongoing exposures.  No mold, hot tub, Jacuzzi.  No down pillows or comforter Smoking history: 100-pack-year smoker.  Quit in 2016 Travel history: Originally from [place] and [place].  No significant recent travel Relevant family history: No significant family history of lung disease  Interim history: Continues on anoro without issue Is trying to get his medications through the [place] but has been denied.  We will need to file an appeal  Outpatient Encounter Medications as of 09/16/2021  Medication Sig   albuterol (PROVENTIL) (2.5 MG/3ML) 0.083% nebulizer solution Take 2.5 mg by nebulization every 6 (six) hours as needed for wheezing or shortness of breath.   ASPIRIN LOW DOSE 81 MG EC tablet TAKE 1 TABLET DAILY   atorvastatin (LIPITOR) 40 MG tablet TAKE 1 TABLET BY MOUTH DAILY.   Boswellia-Glucosamine-Vit D (OSTEO BI-FLEX ONE PER DAY PO) Take 2 tablets by mouth daily.   Cholecalciferol (VITAMIN D-3) 125 MCG (5000 UT) TABS Take 5,000 Units by mouth daily.   Coenzyme Q10 (CO Q-10 PO) Take 10 mLs by mouth daily.   JARDIANCE 25 MG TABS tablet Take 25 mg by mouth daily.   mirabegron ER (MYRBETRIQ) 25 MG TB24 tablet Take 25 mg by mouth daily.   nadolol (CORGARD) 40 MG tablet Take 1 tablet (40 mg total) by mouth daily.   nitroGLYCERIN (NITROSTAT) 0.4 MG SL tablet Place 1 tablet (0.4  mg total) under the tongue every 5 (five) minutes as needed for chest pain.   TURMERIC PO Take 1,000 mg daily by mouth.   umeclidinium-vilanterol (ANORO ELLIPTA) 62.5-25 MCG/INH AEPB Inhale 1 puff into the lungs daily.   No facility-administered encounter medications on file as of 09/16/2021.   Physical Exam: Blood pressure 122/76, pulse (!) 59, temperature 97.6 F (36.4 C), temperature source Oral, height 5\' 9"  (1.753 m), weight 209 lb 9.6 oz (95.1 kg), SpO2 94 %. Gen:      No acute distress HEENT:  EOMI, sclera anicteric Neck:     No masses; no thyromegaly Lungs:    Clear to auscultation bilaterally; normal respiratory effort CV:         Regular rate and rhythm; no murmurs Abd:      + bowel sounds; soft, non-tender; no palpable masses, no distension Ext:    No edema; adequate peripheral perfusion Skin:      Warm and dry; no rash Neuro: alert and oriented x 3 Psych: normal mood and affect   Data Reviewed: Imaging: Screening CT 02/02/2019-scattered bilateral calcified granuloma with small perifissural nodule.  PFTs: 08/05/2015 FVC 1.9 [4 5%], FEV1 1.3 [42%], F/F 68 Moderate airway obstruction  Labs: WBC 7.7, eos 10%, absolute eosinophil count 770  Assessment:  Moderate COPD Continue Anoro, albuterol as needed Elevated peripheral eosinophils indicate he may benefit from inhaled corticosteroids but as his symptoms are stable we will continue current medication for now.  Besides he did not tolerate inhaled corticosteroids in the past due to elevated blood sugar  Has been on anoro for the past 3 years and will need to continue it due to his diagnosis of COPD We will send records to [facility] so that he can get his medications from there.  If anoro is not covered then any LABA/LAMA inhaler such as stiolto would work.  Ex-smoker Has not received her screening CT since 2000.  Beyond the age cutoff to continue screening Get chest x-ray today  Plan/Recommendations: - Anoro, albuterol as  needed - Chest x-ray  Marshell Garfinkel MD [facility] 09/16/2021, 2:37 PM  CC: Leeroy Cha,*

## 2021-09-16 NOTE — Patient Instructions (Signed)
We will get a chest x-ray today Continue anoro We will send records to the [facility] [place] as in hopes that he can get medications from there Follow-up in 1 year.

## 2021-09-16 NOTE — Progress Notes (Deleted)
         Steven Newman    [id number]    Nov 21, 1941  Primary Care Physician:Varadarajan, Ronie Spies, MD  Referring Physician: Leeroy Cha, MD [address] [place],  [place] [address]  Chief complaint: Follow-up for COPD  HPI: 80 year old ex-smoker.  Previous patient of Dr. Lake Bells Has history of COPD, diabetes, coronary artery disease.  Maintained on Anoro with stable symptoms.  Has mild dyspnea on exertion, chronic cough with white mucus.  Pets: Cats Occupation: Used to Engineer, maintenance.  Was previously in the [place] in [place] Exposures: Exposure to agent orange in the past.  No ongoing exposures.  No mold, hot tub, Jacuzzi.  No down pillows or comforter Smoking history: 100-pack-year smoker.  Quit in 2016 Travel history: Originally from [place] and [place].  No significant recent travel Relevant family history: No significant family history of lung disease  Outpatient Encounter Medications as of 09/16/2021  Medication Sig   albuterol (PROVENTIL) (2.5 MG/3ML) 0.083% nebulizer solution Take 2.5 mg by nebulization every 6 (six) hours as needed for wheezing or shortness of breath.   ASPIRIN LOW DOSE 81 MG EC tablet TAKE 1 TABLET DAILY   atorvastatin (LIPITOR) 40 MG tablet TAKE 1 TABLET BY MOUTH DAILY.   Boswellia-Glucosamine-Vit D (OSTEO BI-FLEX ONE PER DAY PO) Take 2 tablets by mouth daily.   Cholecalciferol (VITAMIN D-3) 125 MCG (5000 UT) TABS Take 5,000 Units by mouth daily.   Coenzyme Q10 (CO Q-10 PO) Take 10 mLs by mouth daily.   JARDIANCE 25 MG TABS tablet Take 25 mg by mouth daily.   mirabegron ER (MYRBETRIQ) 25 MG TB24 tablet Take 25 mg by mouth daily.   nadolol (CORGARD) 40 MG tablet Take 1 tablet (40 mg total) by mouth daily.   nitroGLYCERIN (NITROSTAT) 0.4 MG SL tablet Place 1 tablet (0.4 mg total) under the tongue every 5 (five) minutes as needed for chest pain.   TURMERIC PO Take 1,000 mg daily by mouth.   umeclidinium-vilanterol (ANORO  ELLIPTA) 62.5-25 MCG/INH AEPB Inhale 1 puff into the lungs daily.   No facility-administered encounter medications on file as of 09/16/2021.   Physical Exam: Blood pressure 120/64, pulse 63, temperature (!) 97 F (36.1 C), temperature source Temporal, height 5' 9.5" (1.765 m), weight 227 lb (103 kg), SpO2 95 %. Gen:      No acute distress HEENT:  EOMI, sclera anicteric Neck:     No masses; no thyromegaly Lungs:    Clear to auscultation bilaterally; normal respiratory effort CV:         Regular rate and rhythm; no murmurs Abd:      + bowel sounds; soft, non-tender; no palpable masses, no distension Ext:    No edema; adequate peripheral perfusion Skin:      Warm and dry; no rash Neuro: alert and oriented x 3 Psych: normal mood and affect  Data Reviewed: Imaging: Screening CT 02/02/2019-scattered bilateral calcified granuloma with small perifissural nodule.  PFTs: 08/05/2015 FVC 1.9 [4 5%], FEV1 1.3 [42%], F/F 68 Moderate airway obstruction  Labs: WBC 7.7, eos 10%, absolute eosinophil count 770  Assessment:  Moderate COPD Continue Anoro, albuterol as needed Elevated peripheral eosinophils indicate he may benefit from inhaled corticosteroids but as his symptoms are stable we will continue current medication for now  Ex-smoker Continue low-dose screening CT chest  Plan/Recommendations: - Anoro, albuterol as needed  Marshell Garfinkel MD [facility] 09/16/2021, 2:39 PM  CC: Leeroy Cha,*

## 2021-09-17 ENCOUNTER — Telehealth: Payer: Self-pay | Admitting: Pulmonary Disease

## 2021-09-17 NOTE — Telephone Encounter (Signed)
Lm for patient's spouse, yvette(DPR).

## 2021-09-17 NOTE — Telephone Encounter (Signed)
Spoke to patient's spouse, Steven Newman). Steven Newman reviewed CXR via mychart. She has questions regarding below findings:  1.Emphysema without evidence of acute cardiopulmonary disease 2.Stigmata of emphysema, with increased retrosternal airspace, flattened hemidiaphragms, increased AP diameter, and hyperinflation on the AP view.  Dr. Vaughan Browner, please advise. Thanks

## 2021-09-21 ENCOUNTER — Other Ambulatory Visit (HOSPITAL_COMMUNITY): Payer: Medicare Other

## 2021-09-22 NOTE — Telephone Encounter (Signed)
I called and discussed results with wife. Nothing further needed

## 2021-10-07 DIAGNOSIS — N1832 Chronic kidney disease, stage 3b: Secondary | ICD-10-CM | POA: Diagnosis not present

## 2021-10-12 DIAGNOSIS — I129 Hypertensive chronic kidney disease with stage 1 through stage 4 chronic kidney disease, or unspecified chronic kidney disease: Secondary | ICD-10-CM | POA: Diagnosis not present

## 2021-10-12 DIAGNOSIS — E1122 Type 2 diabetes mellitus with diabetic chronic kidney disease: Secondary | ICD-10-CM | POA: Diagnosis not present

## 2021-10-12 DIAGNOSIS — E875 Hyperkalemia: Secondary | ICD-10-CM | POA: Diagnosis not present

## 2021-10-12 DIAGNOSIS — N1832 Chronic kidney disease, stage 3b: Secondary | ICD-10-CM | POA: Diagnosis not present

## 2021-10-12 DIAGNOSIS — E785 Hyperlipidemia, unspecified: Secondary | ICD-10-CM | POA: Diagnosis not present

## 2021-10-12 DIAGNOSIS — I252 Old myocardial infarction: Secondary | ICD-10-CM | POA: Diagnosis not present

## 2021-10-12 DIAGNOSIS — J449 Chronic obstructive pulmonary disease, unspecified: Secondary | ICD-10-CM | POA: Diagnosis not present

## 2021-10-12 DIAGNOSIS — R32 Unspecified urinary incontinence: Secondary | ICD-10-CM | POA: Diagnosis not present

## 2021-10-20 DIAGNOSIS — Z961 Presence of intraocular lens: Secondary | ICD-10-CM | POA: Diagnosis not present

## 2021-10-20 DIAGNOSIS — H04123 Dry eye syndrome of bilateral lacrimal glands: Secondary | ICD-10-CM | POA: Diagnosis not present

## 2021-10-21 DIAGNOSIS — Z4789 Encounter for other orthopedic aftercare: Secondary | ICD-10-CM | POA: Diagnosis not present

## 2021-11-02 ENCOUNTER — Telehealth: Payer: Self-pay | Admitting: Pulmonary Disease

## 2021-11-02 NOTE — Telephone Encounter (Signed)
Called patient's spouse but she did not answer. Will call back tomorrow.

## 2021-11-03 DIAGNOSIS — Z20822 Contact with and (suspected) exposure to covid-19: Secondary | ICD-10-CM | POA: Diagnosis not present

## 2021-11-03 MED ORDER — ANORO ELLIPTA 62.5-25 MCG/ACT IN AEPB: 1.0000 | INHALATION_SPRAY | Freq: Every day | RESPIRATORY_TRACT | 6 refills | Status: DC

## 2021-11-03 MED ORDER — ANORO ELLIPTA 62.5-25 MCG/ACT IN AEPB: 1.0000 | INHALATION_SPRAY | Freq: Every day | RESPIRATORY_TRACT | 0 refills | Status: DC

## 2021-11-03 NOTE — Telephone Encounter (Signed)
Called and spoke with [place]. She stated that she has been in contact with the [facility] in [place] for the patient's Anoro and they have not received any information from our office. I reviewed the chart and it looks like the RX was never sent to the [facility]. I advised her that I would go ahead and send in the RX as well as the last OV that shows why he needs to be on the Anoro.   She stated that the OV notes can be faxed to [phone] or [phone], Dr. Marjo Bicker Attn: Leone Haven.   She also wanted to know if we had any samples of Anoro. She is aware that I will place the samples at the front desk.   Nothing further needed at time of call.

## 2021-11-04 DIAGNOSIS — E1169 Type 2 diabetes mellitus with other specified complication: Secondary | ICD-10-CM | POA: Diagnosis not present

## 2021-11-04 DIAGNOSIS — Z1389 Encounter for screening for other disorder: Secondary | ICD-10-CM | POA: Diagnosis not present

## 2021-11-04 DIAGNOSIS — N183 Chronic kidney disease, stage 3 unspecified: Secondary | ICD-10-CM | POA: Diagnosis not present

## 2021-11-04 DIAGNOSIS — Z0000 Encounter for general adult medical examination without abnormal findings: Secondary | ICD-10-CM | POA: Diagnosis not present

## 2021-11-04 DIAGNOSIS — M150 Primary generalized (osteo)arthritis: Secondary | ICD-10-CM | POA: Diagnosis not present

## 2021-11-04 DIAGNOSIS — J449 Chronic obstructive pulmonary disease, unspecified: Secondary | ICD-10-CM | POA: Diagnosis not present

## 2021-11-04 DIAGNOSIS — E559 Vitamin D deficiency, unspecified: Secondary | ICD-10-CM | POA: Diagnosis not present

## 2021-11-04 DIAGNOSIS — I252 Old myocardial infarction: Secondary | ICD-10-CM | POA: Diagnosis not present

## 2021-11-04 DIAGNOSIS — I2510 Atherosclerotic heart disease of native coronary artery without angina pectoris: Secondary | ICD-10-CM | POA: Diagnosis not present

## 2021-11-04 DIAGNOSIS — E7800 Pure hypercholesterolemia, unspecified: Secondary | ICD-10-CM | POA: Diagnosis not present

## 2021-11-04 DIAGNOSIS — I10 Essential (primary) hypertension: Secondary | ICD-10-CM | POA: Diagnosis not present

## 2021-11-05 DIAGNOSIS — Z20822 Contact with and (suspected) exposure to covid-19: Secondary | ICD-10-CM | POA: Diagnosis not present

## 2021-11-06 ENCOUNTER — Telehealth: Payer: Self-pay | Admitting: Pulmonary Disease

## 2021-11-06 MED ORDER — STIOLTO RESPIMAT 2.5-2.5 MCG/ACT IN AERS: 2.0000 | INHALATION_SPRAY | Freq: Every day | RESPIRATORY_TRACT | 11 refills | Status: DC

## 2021-11-06 NOTE — Telephone Encounter (Signed)
Spoke with the pt's spouse  She says that the [facility] wants to try pt on Stiolto  They do not want to pay for the Anoro anymore  He has tried utibron, spiriva respimat and symbicort in the past  Please advise what you think about the Stiolto for him, thanks!  Allergies  Allergen Reactions   Ace Inhibitors     Angioedema    Amoxicillin-Pot Clavulanate Other (See Comments)   Penicillins Swelling and Rash    Lips swell

## 2021-11-06 NOTE — Telephone Encounter (Signed)
Called and spoke with [facility].  Dr. Matilde Bash recommendations given.  Understanding stated.  Stiolto prescription sent to [facility].  Nothing further at this time.

## 2021-11-06 NOTE — Telephone Encounter (Signed)
Okay to try stiolto as it is a good replacement for anoro.

## 2021-12-06 DIAGNOSIS — Z20822 Contact with and (suspected) exposure to covid-19: Secondary | ICD-10-CM | POA: Diagnosis not present

## 2021-12-08 ENCOUNTER — Other Ambulatory Visit: Payer: Self-pay | Admitting: Interventional Cardiology

## 2021-12-14 ENCOUNTER — Ambulatory Visit: Payer: Medicare Other | Admitting: Podiatry

## 2021-12-18 DIAGNOSIS — N3941 Urge incontinence: Secondary | ICD-10-CM | POA: Diagnosis not present

## 2021-12-18 DIAGNOSIS — R350 Frequency of micturition: Secondary | ICD-10-CM | POA: Diagnosis not present

## 2021-12-22 ENCOUNTER — Other Ambulatory Visit: Payer: Self-pay

## 2021-12-22 ENCOUNTER — Ambulatory Visit (INDEPENDENT_AMBULATORY_CARE_PROVIDER_SITE_OTHER): Payer: Medicare Other | Admitting: Podiatry

## 2021-12-22 ENCOUNTER — Encounter: Payer: Self-pay | Admitting: Podiatry

## 2021-12-22 DIAGNOSIS — E1169 Type 2 diabetes mellitus with other specified complication: Secondary | ICD-10-CM

## 2021-12-22 DIAGNOSIS — B351 Tinea unguium: Secondary | ICD-10-CM

## 2021-12-22 DIAGNOSIS — E1151 Type 2 diabetes mellitus with diabetic peripheral angiopathy without gangrene: Secondary | ICD-10-CM | POA: Diagnosis not present

## 2021-12-22 DIAGNOSIS — N1832 Chronic kidney disease, stage 3b: Secondary | ICD-10-CM | POA: Diagnosis not present

## 2021-12-22 NOTE — Progress Notes (Signed)
This patient returns to my office for at risk foot care.  This patient requires this care by a professional since this patient will be at risk due to having type 2 diabetes, CKD and swelling right foot. This patient is unable to cut nails himself since the patient cannot reach his nails.These nails are painful walking and wearing shoes.  This patient presents for at risk foot care today.  General Appearance  Alert, conversant and in no acute stress.  Vascular  Dorsalis pedis and posterior tibial  pulses are  weakly palpable  bilaterally.  Capillary return is within normal limits  bilaterally. Temperature is within normal limits  bilaterally.  Neurologic  Senn-Weinstein monofilament wire test within normal limits  bilaterally. Muscle power within normal limits bilaterally.  Nails Thick disfigured discolored nails with subungual debris  from hallux to fifth toes bilaterally. No evidence of bacterial infection or drainage bilaterally. Bleeding from hallux nail lateral border from healing laceration.  Orthopedic  No limitations of motion  feet .  No crepitus or effusions noted.  No bony pathology or digital deformities noted.  Swelling right foot.  Skin  normotropic skin with no porokeratosis noted bilaterally.  No signs of infections or ulcers noted.     Onychomycosis  Pain in right toes  Pain in left toes  Consent was obtained for treatment procedures.   Mechanical debridement of nails 1-5  bilaterally performed with a nail nipper.  Filed with dremel without incident.     Return office visit    3 months                  Told patient to return for periodic foot care and evaluation due to potential at risk complications.   Gardiner Barefoot DPM

## 2021-12-29 DIAGNOSIS — M150 Primary generalized (osteo)arthritis: Secondary | ICD-10-CM | POA: Diagnosis not present

## 2021-12-29 DIAGNOSIS — I2510 Atherosclerotic heart disease of native coronary artery without angina pectoris: Secondary | ICD-10-CM | POA: Diagnosis not present

## 2021-12-29 DIAGNOSIS — I10 Essential (primary) hypertension: Secondary | ICD-10-CM | POA: Diagnosis not present

## 2021-12-29 DIAGNOSIS — J449 Chronic obstructive pulmonary disease, unspecified: Secondary | ICD-10-CM | POA: Diagnosis not present

## 2021-12-29 DIAGNOSIS — E7800 Pure hypercholesterolemia, unspecified: Secondary | ICD-10-CM | POA: Diagnosis not present

## 2021-12-29 DIAGNOSIS — E1169 Type 2 diabetes mellitus with other specified complication: Secondary | ICD-10-CM | POA: Diagnosis not present

## 2021-12-29 DIAGNOSIS — N183 Chronic kidney disease, stage 3 unspecified: Secondary | ICD-10-CM | POA: Diagnosis not present

## 2021-12-29 DIAGNOSIS — I252 Old myocardial infarction: Secondary | ICD-10-CM | POA: Diagnosis not present

## 2022-01-21 DIAGNOSIS — Z4789 Encounter for other orthopedic aftercare: Secondary | ICD-10-CM | POA: Diagnosis not present

## 2022-01-22 ENCOUNTER — Other Ambulatory Visit: Payer: Self-pay | Admitting: Cardiovascular Disease

## 2022-01-22 ENCOUNTER — Other Ambulatory Visit (HOSPITAL_COMMUNITY): Payer: Self-pay | Admitting: Cardiovascular Disease

## 2022-01-22 DIAGNOSIS — I714 Abdominal aortic aneurysm, without rupture, unspecified: Secondary | ICD-10-CM

## 2022-01-22 DIAGNOSIS — I739 Peripheral vascular disease, unspecified: Secondary | ICD-10-CM

## 2022-02-01 ENCOUNTER — Ambulatory Visit (HOSPITAL_COMMUNITY)
Admission: RE | Admit: 2022-02-01 | Discharge: 2022-02-01 | Disposition: A | Discharge status: Home / Self Care | Payer: Medicare Other | Source: Ambulatory Visit | Attending: Cardiology | Admitting: Cardiology

## 2022-02-01 ENCOUNTER — Encounter: Payer: Self-pay | Admitting: Interventional Cardiology

## 2022-02-01 ENCOUNTER — Other Ambulatory Visit (HOSPITAL_COMMUNITY): Payer: Self-pay | Admitting: Cardiovascular Disease

## 2022-02-01 ENCOUNTER — Other Ambulatory Visit: Payer: Self-pay

## 2022-02-01 ENCOUNTER — Ambulatory Visit (HOSPITAL_BASED_OUTPATIENT_CLINIC_OR_DEPARTMENT_OTHER)
Admission: RE | Admit: 2022-02-01 | Discharge: 2022-02-01 | Disposition: A | Discharge status: Home / Self Care | Payer: Medicare Other | Source: Ambulatory Visit | Attending: Cardiovascular Disease | Admitting: Cardiovascular Disease

## 2022-02-01 DIAGNOSIS — I714 Abdominal aortic aneurysm, without rupture, unspecified: Secondary | ICD-10-CM | POA: Diagnosis not present

## 2022-02-01 DIAGNOSIS — I77811 Abdominal aortic ectasia: Secondary | ICD-10-CM | POA: Diagnosis not present

## 2022-02-01 DIAGNOSIS — I723 Aneurysm of iliac artery: Secondary | ICD-10-CM

## 2022-02-01 DIAGNOSIS — I739 Peripheral vascular disease, unspecified: Secondary | ICD-10-CM

## 2022-02-01 MED ORDER — NITROGLYCERIN 0.4 MG SL SUBL: 0.4000 mg | SUBLINGUAL_TABLET | SUBLINGUAL | 3 refills | Status: AC | PRN

## 2022-02-04 ENCOUNTER — Other Ambulatory Visit: Payer: Self-pay

## 2022-02-10 DIAGNOSIS — D1801 Hemangioma of skin and subcutaneous tissue: Secondary | ICD-10-CM | POA: Diagnosis not present

## 2022-02-10 DIAGNOSIS — B353 Tinea pedis: Secondary | ICD-10-CM | POA: Diagnosis not present

## 2022-02-10 DIAGNOSIS — Z85828 Personal history of other malignant neoplasm of skin: Secondary | ICD-10-CM | POA: Diagnosis not present

## 2022-02-10 DIAGNOSIS — D692 Other nonthrombocytopenic purpura: Secondary | ICD-10-CM | POA: Diagnosis not present

## 2022-02-10 DIAGNOSIS — L853 Xerosis cutis: Secondary | ICD-10-CM | POA: Diagnosis not present

## 2022-02-10 DIAGNOSIS — L814 Other melanin hyperpigmentation: Secondary | ICD-10-CM | POA: Diagnosis not present

## 2022-02-10 DIAGNOSIS — L821 Other seborrheic keratosis: Secondary | ICD-10-CM | POA: Diagnosis not present

## 2022-02-10 DIAGNOSIS — L570 Actinic keratosis: Secondary | ICD-10-CM | POA: Diagnosis not present

## 2022-02-17 DIAGNOSIS — E669 Obesity, unspecified: Secondary | ICD-10-CM | POA: Diagnosis not present

## 2022-02-17 DIAGNOSIS — E1151 Type 2 diabetes mellitus with diabetic peripheral angiopathy without gangrene: Secondary | ICD-10-CM | POA: Diagnosis not present

## 2022-02-17 DIAGNOSIS — Z6830 Body mass index (BMI) 30.0-30.9, adult: Secondary | ICD-10-CM | POA: Diagnosis not present

## 2022-02-17 DIAGNOSIS — I2510 Atherosclerotic heart disease of native coronary artery without angina pectoris: Secondary | ICD-10-CM | POA: Diagnosis not present

## 2022-03-23 ENCOUNTER — Encounter: Payer: Self-pay | Admitting: Cardiovascular Disease

## 2022-03-23 ENCOUNTER — Ambulatory Visit (INDEPENDENT_AMBULATORY_CARE_PROVIDER_SITE_OTHER): Payer: Medicare Other | Admitting: Cardiovascular Disease

## 2022-03-23 VITALS — BP 140/78 | HR 63 | Ht 69.0 in | Wt 210.0 lb

## 2022-03-23 DIAGNOSIS — I739 Peripheral vascular disease, unspecified: Secondary | ICD-10-CM | POA: Diagnosis not present

## 2022-03-23 DIAGNOSIS — I2510 Atherosclerotic heart disease of native coronary artery without angina pectoris: Secondary | ICD-10-CM

## 2022-03-23 DIAGNOSIS — E785 Hyperlipidemia, unspecified: Secondary | ICD-10-CM

## 2022-03-23 DIAGNOSIS — I714 Abdominal aortic aneurysm, without rupture, unspecified: Secondary | ICD-10-CM | POA: Diagnosis not present

## 2022-03-23 NOTE — Patient Instructions (Signed)
Medication Instructions:  ?No changes ?*If you need a refill on your cardiac medications before your next appointment, please call your pharmacy* ? ? ?Lab Work: ?None ordered ?If you have labs (blood work) drawn today and your tests are completely normal, you will receive your results only by: ?MyChart Message (if you have MyChart) OR ?A paper copy in the mail ?If you have any lab test that is abnormal or we need to change your treatment, we will call you to review the results. ? ? ?Testing/Procedures: ?None ordered ? ? ?Follow-Up: ?At [facility], you and your health needs are our priority.  As part of our continuing mission to provide you with exceptional heart care, we have created designated Provider Care Teams.  These Care Teams include your primary Cardiologist (physician) and Advanced Practice Providers (APPs -  Physician Assistants and Nurse Practitioners) who all work together to provide you with the care you need, when you need it. ? ?We recommend signing up for the patient portal called "MyChart".  Sign up information is provided on this After Visit Summary.  MyChart is used to connect with patients for Virtual Visits (Telemedicine).  Patients are able to view lab/test results, encounter notes, upcoming appointments, etc.  Non-urgent messages can be sent to your provider as well.   ?To learn more about what you can do with MyChart, go to [email or web address].   ? ?Your next appointment:   ?12 month(s) ? ?The format for your next appointment:   ?In Person ? ?Provider:   ?Dr. Arida ? ?Important Information About [place] ? ? ? ? ? ? ?

## 2022-03-23 NOTE — Progress Notes (Signed)
?  ?Cardiology Office Note ? ? ?Date:  03/23/2022  ? ?ID:  Steven Newman, DOB 1941-08-31, MRN [id number] ? ?PCP:  Leeroy Cha, MD  ?Cardiologist:  Dr. Irish Lack ? ?Chief Complaint  ?Patient presents with  ? Follow-up  ?  13 months.  ? ? ?  ?History of Present Illness: ?Steven Newman is a 81 y.o. male who is here today for a follow-up visit regarding peripheral arterial disease.  He has known history of coronary artery disease status post MI and multiple PCI in the mid 90s. No recent revascularization. He is a previous smoker and quit in 2016. Other chronic medical conditions include hypertension, hyperlipidemia and obesity. ? ?He has known history of abdominal aortic aneurysm, right common iliac artery aneurysm as well as peripheral arterial disease.  He has no claudication or other symptoms related to this and thus he is being managed medically. ? ?Most recent vascular studies in February showed an ABI of 0.66 on the right and 0.81 on the left.  Duplex showed stable size abdominal aortic aneurysm at 3.2 cm and stable size right common iliac artery aneurysm at 2 cm. ? ?He continues to deny claudication.  No chest pain or worsening dyspnea.  He is planning a trip to [place] in May. ? ?Past Medical History:  ?Diagnosis Date  ? CAD (coronary artery disease)   ? coronary angioplasty 1995  ? COPD (chronic obstructive pulmonary disease) ([facility])   ? Dyspnea   ? Erectile dysfunction   ? History of bladder cancer   ? Hx of colonic polyps   ? Hyperlipidemia   ? Hypertension   ? Kidney stones   ? MI (myocardial infarction) ([facility])   ? 1976, 1985  ? Obesity   ? Pneumonia   ? Skin cancer   ? Type 2 diabetes mellitus ([facility])   ? Wheezing   ? ? ?Past Surgical History:  ?Procedure Laterality Date  ? bladder cancer surgery    ? CARDIAC CATHETERIZATION    ? 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 neck tumor    ? REVERSE SHOULDER ARTHROPLASTY Left 08/07/2021  ? Procedure: REVERSE SHOULDER ARTHROPLASTY;  Surgeon: Netta Cedars, MD;   Location: [facility];  Service: Orthopedics;  Laterality: Left;  with ISB  ? ? ? ?Current Outpatient Medications  ?Medication Sig Dispense Refill  ? albuterol (PROVENTIL) (2.5 MG/3ML) 0.083% nebulizer solution Take 2.5 mg by nebulization every 6 (six) hours as needed for wheezing or shortness of breath.    ? ASPIRIN LOW DOSE 81 MG EC tablet TAKE 1 TABLET DAILY 90 tablet 2  ? atorvastatin (LIPITOR) 40 MG tablet TAKE 1 TABLET BY MOUTH DAILY. 90 tablet 3  ? Boswellia-Glucosamine-Vit D (OSTEO BI-FLEX ONE PER DAY PO) Take 2 tablets by mouth daily.    ? Cholecalciferol (VITAMIN D-3) 125 MCG (5000 UT) TABS Take 5,000 Units by mouth daily.    ? Coenzyme Q10 (CO Q-10 PO) Take 10 mLs by mouth daily.    ? JARDIANCE 25 MG TABS tablet Take 25 mg by mouth daily.    ? mirabegron ER (MYRBETRIQ) 25 MG TB24 tablet Take 25 mg by mouth daily.    ? nadolol (CORGARD) 40 MG tablet Take 1 tablet (40 mg total) by mouth daily. 90 tablet 3  ? nitroGLYCERIN (NITROSTAT) 0.4 MG SL tablet Place 1 tablet (0.4 mg total) under the tongue every 5 (five) minutes as needed for chest pain. 25 tablet 3  ? Tiotropium Bromide-Olodaterol (STIOLTO RESPIMAT) 2.5-2.5 MCG/ACT  AERS Inhale 2 puffs into the lungs daily. 4 g 11  ? TURMERIC PO Take 1,000 mg daily by mouth.    ? umeclidinium-vilanterol (ANORO ELLIPTA) 62.5-25 MCG/ACT AEPB Inhale 1 puff into the lungs daily. 2 each 0  ? vitamin B-12 (CYANOCOBALAMIN) 1000 MCG tablet Take 1,000 mcg by mouth daily.    ? ?No current facility-administered medications for this visit.  ? ? ?Allergies:   Ace inhibitors, Amoxicillin-pot clavulanate, and Penicillins  ? ? ?Social History:  The patient  reports that he quit smoking about 6 years ago. His smoking use included cigarettes. He has a 100.00 pack-year smoking history. He has never used smokeless tobacco. He reports current alcohol use. He reports that he does not use drugs.  ? ?Family History:  The patient's family history includes Cancer in his sister; Diabetes in his  sister; Heart disease in his mother; Rheumatic fever in his brother and father.  ? ? ?ROS:  Please see the history of present illness.   Otherwise, review of systems are positive for none.   All other systems are reviewed and negative.  ? ? ?PHYSICAL EXAM: ?VS:  BP 140/78 (BP Location: Left Arm, Patient Position: Sitting, Cuff Size: Normal)   Pulse 63   Ht '5\' 9"'$  (1.753 m)   Wt 210 lb (95.3 kg)   BMI 31.01 kg/m?  , BMI Body mass index is 31.01 kg/m?. ?GEN: Well nourished, well developed, in no acute distress  ?HEENT: normal  ?Neck: no JVD, carotid bruits, or masses ?Cardiac: RRR; no rubs, or gallops,no edema .  1 out of 6 systolic murmur in the aortic area.   ?Respiratory:  clear to auscultation bilaterally, normal work of breathing ?GI: soft, nontender, nondistended, + BS ?MS: no deformity or atrophy  ?Skin: warm and dry, no rash ?Neuro:  Strength and sensation are intact ?Psych: euthymic mood, full affect ?Vascular: Femoral pulse: Slightly diminished bilaterally. ? ? ?EKG:  EKG is ordered today. 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 ? ? ?Recent Labs: ?07/21/2021: Platelets 115 ?08/08/2021: Hemoglobin 13.8 ?09/15/2021: BUN 31; Creatinine, Ser 1.31; Potassium 4.7; Sodium 142  ? ? ?Lipid Panel ?   ?Component Value Date/Time  ? CHOL 159 12/23/2014 0923  ? TRIG 129 12/23/2014 0923  ? HDL 45 12/23/2014 0923  ? [place] 88 12/23/2014 0923  ? ?  ? ?Wt Readings from Last 3 Encounters:  ?03/23/22 210 lb (95.3 kg)  ?09/16/21 209 lb 9.6 oz (95.1 kg)  ?09/15/21 210 lb 9.6 oz (95.5 kg)  ?  ? ? ? ? ?  01/25/2017  ?  9:25 AM  ?PAD Screen  ?Previous PAD dx? No  ?Previous surgical procedure? Yes  ?Dates of procedures hx of bladder cancer  ?Pain with walking? Yes  ?Subsides with rest? Yes  ?Feet/toe relief with dangling? No  ?Painful, non-healing ulcers? No  ?Extremities discolored? Yes  ? ? ? ? ?ASSESSMENT AND PLAN: ? ?1.  Abdominal aortic/right common iliac artery aneurysm: These  have been stable in size and below threshold for repair.  The patient continues to be asymptomatic.  Repeat studies in 1 year. ? ?2. Peripheral arterial disease: The patient has known left external iliac artery stenosis as well as right popliteal artery disease.  In spite of that, he has no symptoms.  Thus, continue medical therapy.   ? ?3. Hyperlipidemia: Continue atorvastatin 40 mg daily.  I reviewed most recent lipid profile done in November which showed an LDL of  88.  Recommended target LDL of less than 70.  I discussed with him the option of switching to rosuvastatin or adding ezetimibe 10 mg daily.  The patient prefers to wait for now but this should be considered.  I will forward this to his primary care physician. ?Right ? ?4.  Coronary artery disease involving native coronary arteries without angina: Continue medical therapy. ? ? ?Disposition:   FU with me in 1 year ? ?Signed, ? ?Kathlyn Sacramento, MD  ?03/23/2022 9:47 AM    ?[place] ?

## 2022-03-24 ENCOUNTER — Encounter: Payer: Self-pay | Admitting: Podiatry

## 2022-03-24 ENCOUNTER — Ambulatory Visit (INDEPENDENT_AMBULATORY_CARE_PROVIDER_SITE_OTHER): Payer: Medicare Other | Admitting: Podiatry

## 2022-03-24 DIAGNOSIS — B351 Tinea unguium: Secondary | ICD-10-CM | POA: Diagnosis not present

## 2022-03-24 DIAGNOSIS — E1151 Type 2 diabetes mellitus with diabetic peripheral angiopathy without gangrene: Secondary | ICD-10-CM | POA: Diagnosis not present

## 2022-03-24 DIAGNOSIS — E1169 Type 2 diabetes mellitus with other specified complication: Secondary | ICD-10-CM

## 2022-03-24 DIAGNOSIS — N1832 Chronic kidney disease, stage 3b: Secondary | ICD-10-CM

## 2022-03-24 NOTE — Progress Notes (Signed)
This patient returns to my office for at risk foot care.  This patient requires this care by a professional since this patient will be at risk due to having type 2 diabetes, CKD and swelling right foot.  This patient is unable to cut nails himself since the patient cannot reach his nails.These nails are painful walking and wearing shoes. He says he pulled skin out of the outside border right big toe. This patient presents for at risk foot care today. ? ?General Appearance  Alert, conversant and in no acute stress. ? ?Vascular  Dorsalis pedis and posterior tibial  pulses are  weakly palpable  bilaterally.  Capillary return is within normal limits  bilaterally. Temperature is within normal limits  bilaterally. ? ?Neurologic  Senn-Weinstein monofilament wire test within normal limits  bilaterally. Muscle power within normal limits bilaterally. ? ?Nails Thick disfigured discolored nails with subungual debris  from hallux to fifth toes bilaterally. No evidence of bacterial infection or drainage bilaterally. Bleeding from hallux nail lateral border from self inflicted. ? ?Orthopedic  No limitations of motion  feet .  No crepitus or effusions noted.  No bony pathology or digital deformities noted.  Swelling right foot. ? ?Skin  normotropic skin with no porokeratosis noted bilaterally.  No signs of infections or ulcers noted.    ? ?Onychomycosis  Pain in right toes  Pain in left toes ? ?Consent was obtained for treatment procedures.   Mechanical debridement of nails 1-5  bilaterally performed with a nail nipper.  Filed with dremel without incident.  Neosporin/DSD ight hallux. ? ? ?Return office visit    3 months                  Told patient to return for periodic foot care and evaluation due to potential at risk complications. ? ? ?Gardiner Barefoot DPM   ?

## 2022-04-02 DIAGNOSIS — I10 Essential (primary) hypertension: Secondary | ICD-10-CM | POA: Diagnosis not present

## 2022-04-02 DIAGNOSIS — E7800 Pure hypercholesterolemia, unspecified: Secondary | ICD-10-CM | POA: Diagnosis not present

## 2022-04-02 DIAGNOSIS — I2510 Atherosclerotic heart disease of native coronary artery without angina pectoris: Secondary | ICD-10-CM | POA: Diagnosis not present

## 2022-04-02 DIAGNOSIS — E1151 Type 2 diabetes mellitus with diabetic peripheral angiopathy without gangrene: Secondary | ICD-10-CM | POA: Diagnosis not present

## 2022-04-02 DIAGNOSIS — N1832 Chronic kidney disease, stage 3b: Secondary | ICD-10-CM | POA: Diagnosis not present

## 2022-04-07 DIAGNOSIS — N1832 Chronic kidney disease, stage 3b: Secondary | ICD-10-CM | POA: Diagnosis not present

## 2022-04-13 DIAGNOSIS — R32 Unspecified urinary incontinence: Secondary | ICD-10-CM | POA: Diagnosis not present

## 2022-04-13 DIAGNOSIS — I252 Old myocardial infarction: Secondary | ICD-10-CM | POA: Diagnosis not present

## 2022-04-13 DIAGNOSIS — I129 Hypertensive chronic kidney disease with stage 1 through stage 4 chronic kidney disease, or unspecified chronic kidney disease: Secondary | ICD-10-CM | POA: Diagnosis not present

## 2022-04-13 DIAGNOSIS — E875 Hyperkalemia: Secondary | ICD-10-CM | POA: Diagnosis not present

## 2022-04-13 DIAGNOSIS — J449 Chronic obstructive pulmonary disease, unspecified: Secondary | ICD-10-CM | POA: Diagnosis not present

## 2022-04-13 DIAGNOSIS — N1832 Chronic kidney disease, stage 3b: Secondary | ICD-10-CM | POA: Diagnosis not present

## 2022-04-13 DIAGNOSIS — E1122 Type 2 diabetes mellitus with diabetic chronic kidney disease: Secondary | ICD-10-CM | POA: Diagnosis not present

## 2022-04-20 DIAGNOSIS — N1832 Chronic kidney disease, stage 3b: Secondary | ICD-10-CM | POA: Diagnosis not present

## 2022-06-16 DIAGNOSIS — L821 Other seborrheic keratosis: Secondary | ICD-10-CM | POA: Diagnosis not present

## 2022-06-16 DIAGNOSIS — L570 Actinic keratosis: Secondary | ICD-10-CM | POA: Diagnosis not present

## 2022-06-16 DIAGNOSIS — D225 Melanocytic nevi of trunk: Secondary | ICD-10-CM | POA: Diagnosis not present

## 2022-06-16 DIAGNOSIS — Z85828 Personal history of other malignant neoplasm of skin: Secondary | ICD-10-CM | POA: Diagnosis not present

## 2022-06-17 ENCOUNTER — Encounter: Payer: Medicare Other | Admitting: Counselor

## 2022-06-21 ENCOUNTER — Encounter: Payer: Medicare Other | Admitting: Counselor

## 2022-06-22 ENCOUNTER — Encounter: Payer: Medicare Other | Admitting: Psychology

## 2022-06-22 ENCOUNTER — Ambulatory Visit: Payer: Medicare Other | Admitting: Physician Assistant

## 2022-06-24 ENCOUNTER — Encounter: Payer: Self-pay | Admitting: Psychology

## 2022-06-24 ENCOUNTER — Encounter: Payer: Medicare Other | Admitting: Counselor

## 2022-06-24 DIAGNOSIS — Z77098 Contact with and (suspected) exposure to other hazardous, chiefly nonmedicinal, chemicals: Secondary | ICD-10-CM | POA: Insufficient documentation

## 2022-06-24 DIAGNOSIS — Z961 Presence of intraocular lens: Secondary | ICD-10-CM | POA: Insufficient documentation

## 2022-06-24 DIAGNOSIS — E7800 Pure hypercholesterolemia, unspecified: Secondary | ICD-10-CM | POA: Insufficient documentation

## 2022-06-24 DIAGNOSIS — E559 Vitamin D deficiency, unspecified: Secondary | ICD-10-CM | POA: Insufficient documentation

## 2022-06-24 DIAGNOSIS — Z86010 Personal history of colonic polyps: Secondary | ICD-10-CM | POA: Insufficient documentation

## 2022-06-24 DIAGNOSIS — D696 Thrombocytopenia, unspecified: Secondary | ICD-10-CM | POA: Insufficient documentation

## 2022-06-24 DIAGNOSIS — R809 Proteinuria, unspecified: Secondary | ICD-10-CM | POA: Insufficient documentation

## 2022-06-24 DIAGNOSIS — I872 Venous insufficiency (chronic) (peripheral): Secondary | ICD-10-CM | POA: Insufficient documentation

## 2022-06-24 DIAGNOSIS — I219 Acute myocardial infarction, unspecified: Secondary | ICD-10-CM | POA: Insufficient documentation

## 2022-06-24 DIAGNOSIS — R32 Unspecified urinary incontinence: Secondary | ICD-10-CM | POA: Insufficient documentation

## 2022-06-24 DIAGNOSIS — I739 Peripheral vascular disease, unspecified: Secondary | ICD-10-CM | POA: Insufficient documentation

## 2022-06-25 ENCOUNTER — Ambulatory Visit: Payer: Medicare Other | Admitting: Podiatry

## 2022-06-25 ENCOUNTER — Ambulatory Visit: Payer: Medicare Other | Admitting: Psychology

## 2022-06-25 ENCOUNTER — Ambulatory Visit (INDEPENDENT_AMBULATORY_CARE_PROVIDER_SITE_OTHER): Payer: Medicare Other | Admitting: Psychology

## 2022-06-25 ENCOUNTER — Encounter: Payer: Self-pay | Admitting: Psychology

## 2022-06-25 DIAGNOSIS — G3184 Mild cognitive impairment, so stated: Secondary | ICD-10-CM

## 2022-06-25 DIAGNOSIS — I6781 Acute cerebrovascular insufficiency: Secondary | ICD-10-CM | POA: Diagnosis not present

## 2022-06-25 DIAGNOSIS — R4189 Other symptoms and signs involving cognitive functions and awareness: Secondary | ICD-10-CM

## 2022-06-25 NOTE — Progress Notes (Signed)
   Psychometrician Note   Cognitive testing was administered to Steven Newman by Milana Kidney, B.S. (psychometrist) under the supervision of Dr. Christia Reading, Ph.D., licensed psychologist on 06/25/2022. [name] did not appear overtly distressed by the testing session per behavioral observation or responses across self-report questionnaires. Rest breaks were offered.    The battery of tests administered was selected by Dr. Christia Reading, Ph.D. with consideration to [name] current level of functioning, the nature of his symptoms, emotional and behavioral responses during interview, level of literacy, observed level of motivation/effort, and the nature of the referral question. This battery was communicated to the psychometrist. Communication between Dr. Christia Reading, Ph.D. and the psychometrist was ongoing throughout the evaluation and Dr. Christia Reading, Ph.D. was immediately accessible at all times. Dr. Christia Reading, Ph.D. provided supervision to the psychometrist on the date of this service to the extent necessary to assure the quality of all services provided.    Steven Newman will return within approximately 1-2 weeks for an interactive feedback session with Dr. Melvyn Novas at which time his test performances, clinical impressions, and treatment recommendations will be reviewed in detail. [name] understands he can contact our office should he require our assistance before this time.  A total of 140 minutes of billable time were spent face-to-face with [name] by the psychometrist. This includes both test administration and scoring time. Billing for these services is reflected in the clinical report generated by Dr. Christia Reading, Ph.D.  This note reflects time spent with the psychometrician and does not include test scores or any clinical interpretations made by Dr. Melvyn Novas. The full report will follow in a separate note.

## 2022-06-25 NOTE — Progress Notes (Unsigned)
NEUROPSYCHOLOGICAL EVALUATION [place] Department of Neurology  Date of Evaluation: June 25, 2022  Reason for Referral:   Steven Newman is a 81 y.o. right-handed Caucasian male referred by Metta Clines, D.O., to characterize his current cognitive functioning and assist with diagnostic clarity and treatment planning in the context of subjective cognitive decline.   Assessment and Plan:   Clinical Impression(s): Steven Newman pattern of performance is suggestive of performance variability across domains of processing speed, executive functioning, visuospatial abilities, and both encoding (i.e., learning) and retrieval aspects of memory. No cognitive domains exhibited consistent impairment. Performances were appropriate relative to age-matched peers across domains of attention/concentration, safety/judgment, receptive and expressive language, and recognition/consolidation aspects of memory.  Steven Newman largely denied difficulties completing instrumental activities of daily living (ADLs) independently. 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  Relative to his previous evaluation in March 2022, performances exhibited a very large degree of stability. The only task which exhibited a measurable decline was a visuoconstructional figure drawing task. However, poor performance on this task was due to poor attention to detail rather than visuospatial impairment as he omitted several aspects entirely. Very slight decline could also be argued across a list learning memory task. However, other memory tasks exhibited evidence for mild improvement if not full stability. All other domains exhibited complete stability.   Regarding etiology, I fortunately do not see compelling evidence for an underlying progressive neurodegenerative  condition at the present time. This is evidenced by a very large degree of stability, with evidence for perhaps very mild improvements across most memory tasks as opposed to measurable decline. This would be inconsistent with Alzheimer's disease, as would continued strength across aspects of expressive language. 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 Numerous vascular and complex medical conditions would be expected to produce variability and/or weakness across testing in the exact areas exhibited by Steven Newman. General stability over time would also be expected. As such, this represents the most likely etiology for ongoing dysfunction. It is also worth highlighting that research has shown that individuals with Agent Orange exposure may be at an increased risk for cognitive decline. The influence of this in his current clinical presentation is plausible but unknown.   He does not exhibit behavioral or cognitive patterns which are compelling for Lewy body disease, frontotemporal lobar degeneration, or another more rare parkinsonian condition. Continued medical monitoring will be important moving forward.  Recommendations: A repeat neuropsychological evaluation in 18 months (or sooner if functional decline is noted) is recommended to assess the trajectory of future cognitive decline should it occur. This will also aid in future efforts towards improved diagnostic clarity.  If he has a desire to be placed on medication to try and address ongoing memory dysfunction, he should discuss this with Dr. Tomi Likens. It is important to highlight that these medications have been shown to slow functional decline in some individuals. There is no current treatment which can stop or reverse cognitive decline when caused by a neurodegenerative illness.  Steven Newman  denied evidence for breath cessation while asleep. However, medical records suggest a history of obstructive sleep apnea. If present and untreated, sleep apnea can certainly worsen cognitive functioning. It will also increase his risk for heart attack, stroke, and  the later development of a dementia presentation. As such, he is encouraged to ensure that sleep-related concerns are maximally treated. This may require an updated sleep study.   Once he is able to fix hearing aids, I would advise that he wear these devices 100% of the time he spends awake. There is research to suggest that uncorrected hearing loss correlates with future onset of a dementia presentation.   Performance across neurocognitive testing is not a strong predictor of an individual's safety operating a motor vehicle. Should his family wish to pursue a formalized driving evaluation, they could reach out to the following agencies: [employer] in [place]: [phone] [facility]: [facility]: [place]: [phone] or [phone]  Should there be a progression of his current deficits over time, Steven Newman is unlikely to regain any independent living skills lost. Therefore, it is recommended that he remain as involved as possible in all aspects of household chores, finances, and medication management, with supervision to ensure adequate performance. He will likely benefit from the establishment and maintenance of a routine in order to maximize his functional abilities over time.  Steven Newman is encouraged to attend to lifestyle factors for brain health (e.g., regular physical exercise, good nutrition habits, regular participation in cognitively-stimulating activities, and general stress management techniques), which are likely to have benefits for both emotional adjustment and cognition. In fact, in addition to promoting good general health, regular exercise  incorporating aerobic activities (e.g., brisk walking, jogging, cycling, etc.) has been demonstrated to be a very effective treatment for depression and stress, with similar efficacy rates to both antidepressant medication and psychotherapy. Optimal control of vascular risk factors (including safe cardiovascular exercise and adherence to dietary recommendations) is encouraged. Likewise, Continued participation in activities which provide mental stimulation and social interaction is also recommended.   Memory can be improved using internal strategies such as rehearsal, repetition, chunking, mnemonics, association, and imagery. External strategies such as written notes in a consistently used memory journal, visual and nonverbal auditory cues such as a calendar on the refrigerator or appointments with alarm, such as on a cell phone, can also help maximize recall.    Because he shows better recall for structured information, he will likely understand and retain new information better if it is presented to him in a meaningful or well-organized manner at the outset, such as grouping items into meaningful categories or presenting information in an outlined, bulleted, or story format.   To address problems with processing speed, he may wish to consider:   -Ensuring that he is alerted when essential material or instructions are being presented   -Adjusting the speed at which new information is presented   -Allowing for more time in comprehending, processing, and responding in conversation  To address problems with executive dysfunction, he may wish to consider:   -Avoiding external distractions when needing to concentrate   -Limiting exposure to fast paced environments with multiple sensory demands   -Writing down complicated information and using checklists   -Attempting and completing one task at a time (i.e., no multi-tasking)   -Verbalizing aloud each step of a task to maintain focus   -Reducing the  amount of information considered at one time  Review of Records:   Steven Newman was seen by [facility] Metta Clines, D.O.) on 01/02/2021 for an evaluation of memory loss and prior falls. At that time, his wife reported ongoing memory concerns for the past year. Examples included leaving a letter  in the counter which needed to be mailed out, forgetting that food is in the refrigerator for him to eat, forgetting the day of the week, forgetting how to get to certain doctor offices, and other generalized symptoms. He continues to drive and trouble with ADLs was largely denied at that time. Performance on a brief cognitive screening instrument (SLUMS) was 19/30 at that time.   His wife also reported unsteady gait, also ongoing for the past year or so. She noted that he seems to wobble from side to side when he walks and had a fall about three weeks prior to meeting with Dr. Tomi Likens after tripping on some steps. Medically, he does have osteoarthritis in his hips, back, and knees, as well as a history of type II diabetes.    He completed a comprehensive neuropsychological evaluation Alphonzo Severance, Psy.D.) on 03/04/2021. Results suggested ongoing memory deficits, particularly surrounding encoding (i.e., learning) and retrieval aspects. He did demonstrate some ability to retain information and did not exhibit a complete storage impairment. He also exhibited some difficulties across processing speed. 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 Repeat testing was recommended.   Steven Newman most recently met with Dr. Tomi Likens on 06/17/2021 for follow-up. Labs were performed and he was advised to start B12 supplementation. Other symptoms were generally described as being stable.   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   Myocardial infarction    Neck pain 03/05/2015   Obesity    OSA (obstructive sleep apnea) 08/09/2013    October 2014:AHI of 14 , RDI 33/h and desaturation to 88%    Osteoarthritis of left knee 04/24/2021   Osteoarthritis of right knee 04/24/2021   Pain in joint of left shoulder 01/14/2021   Peripheral arterial disease    Pneumonia    Rotator cuff tear arthropathy 05/14/2021   Skin cancer    Thrombocytopenia    Type 2 diabetes mellitus with peripheral angiopathy    Urinary incontinence    Venous insufficiency (chronic) (peripheral)    Vitamin D deficiency    Wheezing     Past Surgical History:  Procedure Laterality Date   bladder cancer surgery     CARDIAC CATHETERIZATION     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     REVERSE SHOULDER ARTHROPLASTY Left 08/07/2021   Procedure: REVERSE SHOULDER ARTHROPLASTY;  Surgeon: Netta Cedars,  MD;  Location: [facility];  Service: Orthopedics;  Laterality: Left;  with ISB    Current Outpatient Medications:    albuterol (PROVENTIL) (2.5 MG/3ML) 0.083% nebulizer  solution, Take 2.5 mg by nebulization every 6 (six) hours as needed for wheezing or shortness of breath., Disp: , Rfl:    ASPIRIN LOW DOSE 81 MG EC tablet, TAKE 1 TABLET DAILY, Disp: 90 tablet, Rfl: 2   atorvastatin (LIPITOR) 40 MG tablet, TAKE 1 TABLET BY MOUTH DAILY., Disp: 90 tablet, Rfl: 3   Boswellia-Glucosamine-Vit D (OSTEO BI-FLEX ONE PER DAY PO), Take 2 tablets by mouth daily., Disp: , Rfl:    Cholecalciferol (VITAMIN D-3) 125 MCG (5000 UT) TABS, Take 5,000 Units by mouth daily., Disp: , Rfl:    Coenzyme Q10 (CO Q-10 PO), Take 10 mLs by mouth daily., Disp: , Rfl:    JARDIANCE 25 MG TABS tablet, Take 25 mg by mouth daily., Disp: , Rfl:    mirabegron ER (MYRBETRIQ) 25 MG TB24 tablet, Take 25 mg by mouth daily., Disp: , Rfl:    nadolol (CORGARD) 40 MG tablet, Take 1 tablet (40 mg total) by mouth daily., Disp: 90 tablet, Rfl: 3   nitroGLYCERIN (NITROSTAT) 0.4 MG SL tablet, Place 1 tablet (0.4 mg total) under the tongue every 5 (five) minutes as needed for chest pain., Disp: 25 tablet, Rfl: 3   Tiotropium Bromide-Olodaterol (STIOLTO RESPIMAT) 2.5-2.5 MCG/ACT AERS, Inhale 2 puffs into the lungs daily., Disp: 4 g, Rfl: 11   TURMERIC PO, Take 1,000 mg daily by mouth., Disp: , Rfl:    umeclidinium-vilanterol (ANORO ELLIPTA) 62.5-25 MCG/ACT AEPB, Inhale 1 puff into the lungs daily., Disp: 2 each, Rfl: 0   vitamin B-12 (CYANOCOBALAMIN) 1000 MCG tablet, Take 1,000 mcg by mouth daily., Disp: , Rfl:   Clinical Interview:   The following information was obtained during a clinical interview with Steven Newman and his wife prior to cognitive testing.  Cognitive Symptoms: Decreased short-term memory: Endorsed. While he generally diminished memory concerns, he did acknowledge some trouble recalling details of past conversations and names of familiar individuals. He reported stability over time. His wife reported greater concerns surrounding memory and provided many of the same examples as mentioned above (when  reporting to Dr. Tomi Likens). Per her perception, memory deficits have progressively worsened since previous testing. At the same time, she did report good and bad days, suggesting some fluctuations.  Decreased long-term memory: Denied. Decreased attention/concentration: Denied. Reduced processing speed: Endorsed. He reported a longstanding and unchanged trait of always being slower to digest and process information. This was said to be unchanged over time. His wife was in agreement.  Difficulties with executive functions: Denied. However, his wife described ongoing trouble with multi-tasking and organization. They denied trouble with impulsivity or any significant personality changes.  Difficulties with emotion regulation: Denied. Difficulties with receptive language: Denied. Difficulties with word finding: Denied. Decreased visuoperceptual ability: Denied.  Difficulties completing ADLs: Largely denied. He continues to manage his pillbox independently. Despite this, his wife did report increased confusion and that he will have some occasional difficulties. His wife manages finances and bill paying, which is longstanding and unchanged in nature. He continues to drive. He denied any recent accidents or near-misses and his wife did not report observations of unsafe driving practices.   Additional Medical History: History of traumatic brain injury/concussion: Unclear. His wife reported a history of falls, including some with potential head impact. I was unable to locate records which suggest prior concussion diagnoses. No head  impacts since his previous neuropsychological evaluation were reported.  History of stroke: Denied. History of seizure activity: Denied. History of known exposure to toxins: Denied. Symptoms of chronic pain: Endorsed. He reported ongoing chronic pain surrounding his shoulders and knees bilaterally.  Experience of frequent headaches/migraines: Denied. Frequent instances of  dizziness/vertigo: Endorsed. During the past several weeks, he reported an increase in frequency, with symptoms occurring after he bends down and returns upright. Outside of this more acute timeframe, symptoms were said to be infrequent.   Sensory changes: He wears glasses with benefit. He has ongoing hearing loss. He reported that his hearing aids are not functioning properly at the present time. His wife estimates that he wears his hearing aids maybe 10% of the time. Other sensory changes/difficulties (e.g., taste or smell) were denied.  Balance/coordination difficulties: Largely denied. For the past several weeks, he reported increased dizziness after bending over, which can impact his balance. Medical records do suggest several falls in the past. His most recent fall was reportedly 3-4 months prior to the current evaluation. One side of the body was not said to be worse than the other.  Other motor difficulties: Denied.  Sleep History: Estimated hours obtained each night: 6-7 hours.  Difficulties falling asleep: Denied. Difficulties staying asleep: Endorsed. He reported waking to use the restroom several times throughout the night and will sometimes have trouble falling back asleep.  Feels rested and refreshed upon awakening: Denied.  History of snoring: Endorsed. His wife noted that these symptoms seem to have improved over time.  History of waking up gasping for air: Denied. Witnessed breath cessation while asleep: Denied.  History of vivid dreaming: Denied. Excessive movement while asleep: Denied. Instances of acting out his dreams: Denied.  Psychiatric/Behavioral Health History: Depression: He described his current mood as "so-so." This was attributed to his sister passing away about one month prior to the current evaluation due to illness. His wife wondered about suppressed depressive emotions stemming his sister's passing, as well as surrounding the passing of their son about six years  prior. He has repeatedly insisted that he is not depressed. Current or remote suicidal ideation, intent, or plan was denied.  Anxiety: Denied. Mania: Denied. Trauma History: Denied. Visual/auditory hallucinations: Denied. Delusional thoughts: Denied.  Tobacco: Denied. Alcohol: He reported infrequent alcohol consumption (likely 2-4 beverages per month) and denied a history of problematic alcohol abuse or dependence.  Recreational drugs: Denied.  Family History: Problem Relation Age of Onset   Heart disease Mother    Rheumatic fever Father    Rheumatic fever Brother    Diabetes Sister    Cancer Sister        cancer on eyelid   This information was confirmed by Steven Newman.  Academic/Vocational History: Highest level of educational attainment: 12 years. He graduated from high school and described himself as an average (B/B+) student in academic settings. No relative weaknesses were identified.  History of developmental delay: Denied. History of grade repetition: Denied. Enrollment in special education courses: Denied. History of LD/ADHD: Denied.  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 He was medically disabled in his 7s stemming from several heart attacks.   Evaluation Results:   Behavioral Observations: Steven Newman was accompanied by his wife, arrived to his appointment on time, and was appropriately dressed and groomed. He appeared alert and oriented. Observed gait and station were within normal limits. Gross motor functioning  appeared intact upon informal observation and no abnormal movements (e.g., tremors) were noted. His affect was generally relaxed and positive, but did range appropriately given the subject being discussed during the clinical interview or the task at hand during testing procedures. Spontaneous speech was fluent and word finding difficulties were not observed during the  clinical interview. Thought processes were coherent, organized, and normal in content. Insight into his cognitive difficulties appeared adequate.   During testing, sustained attention was appropriate. Task engagement was adequate and he persisted when challenged. Overall, Steven Newman was cooperative with the clinical interview and subsequent testing procedures.   Adequacy of Effort: The validity of neuropsychological testing is limited by the extent to which the individual being tested may be assumed to have exerted adequate effort during testing. Steven Newman expressed his intention to perform to the best of his abilities and exhibited adequate task engagement and persistence. Scores across stand-alone and embedded performance validity measures were within expectation. As such, the results of the current evaluation are believed to be a valid representation of Steven Newman current cognitive functioning.  Test Results: Steven Newman was largely oriented at the time of the current evaluation. He was unable to state the current date.   Intellectual abilities based upon educational and vocational attainment were estimated to be in the average range. Premorbid abilities were estimated to be within the below average range based upon a single-word reading test.   Processing speed was variable, ranging from the well below average to average normative ranges. Basic attention was average. More complex attention (e.g., working memory) was also average. Executive functioning was variable, ranging from the exceptionally low to average normative ranges. He also performed in the above average range across a task assessing safety and judgment.  Assessed receptive language abilities were average. Likewise, Steven Newman did not exhibit any difficulties comprehending task instructions and answered all questions asked of him appropriately. Assessed expressive language (e.g., verbal fluency and confrontation naming) was average to  well above average.     Assessed visuospatial/visuoconstructional abilities were variable, ranging from the well below average to average normative ranges. Points were lost on his copy of a complex figure largely due to poor attention to detail as he omitted two aspects entirely.     Learning (i.e., encoding) of novel verbal information was exceptionally low across a list learning task but below average to average across two other tasks. Spontaneous delayed recall (i.e., retrieval) of previously learned information was commensurate with performances across learning trials. Retention rates were 65% across a story learning task, 0% across a list learning task, 71% across a daily living task, and 50% across a figure drawing task. Performance across recognition tasks was average to above average, suggesting evidence for information consolidation.   Results of emotional screening instruments suggested that recent symptoms of generalized anxiety were in the minimal range, while symptoms of depression were within normal limits. A screening instrument assessing recent sleep quality suggested the presence of mild sleep dysfunction.  Tables of Scores:   Note: This summary of test scores accompanies the interpretive report and should not be considered in isolation without reference to the appropriate sections in the text. Descriptors are based on appropriate normative data and may be adjusted based on clinical judgment. Terms such as "Within Normal Limits" and "Outside Normal Limits" are used when a more specific description of the test score cannot be determined. Descriptors refer to the current evaluation only.        Percentile - Normative  Descriptor > 98 - Exceptionally High 91-97 - Well Above Average 75-90 - Above Average 25-74 - Average 9-24 - Below Average 2-8 - Well Below Average < 2 - Exceptionally Low         March 2022 Current    Orientation:       Raw Score Raw Score Percentile   NAB  Orientation, Form 1 --- 27/29 --- ---        Cognitive Screening:       Raw Score Raw Score Percentile   SLUMS: --- 22/30 --- ---        Intellectual Functioning:       Standard Score Standard Score Percentile   Test of Premorbid Functioning: --- 84 14 Below Average        Memory:      NAB Memory Module, Form 1: T Score T Score Percentile   List Learning        Total Trials 1-3 12/36 (40) 9/36 (27) 1 Exceptionally Low    List B 2/12 (44) 1/12 (36) 8 Well Below Average    Short Delay Free Recall 0/12 (29) 1/12 (33) 5 Well Below Average    Long Delay Free Recall 1/12 (37) 0/12 (35) 7 Well Below Average    Retention Percentage 0 0 (30) 2 Well Below Average    Recognition Discriminability 0 3 (43) 25 Average  Story Learning        Immediate Recall 26/80 (31) 35/80 (38) 12 Below Average    Delayed Recall 8/40 (37) 15/40 (39) 14 Below Average    Retention Percentage 50 65 (44) 27 Average  Daily Living Memory        Immediate Recall 24/51 (35) 36/51 (49) 46 Average    Delayed Recall 5/17 (32) 10/17 (46) 34 Average    Retention Percentage 50 71 (45) 31 Average    Recognition Hits 6/10 9/10 (57) 75 Above Average        RBANS, Form A: Raw Score (Scaled Score) Raw Score (Scaled Score) Percentile     Figure Drawing _0 Well Below Average    Figure Recall _1 Below Average        Attention/Executive Function:      Trail Making Test (TMT): T Score Raw Score (T Score) Percentile     Part A 54 43 secs.,  0 errors (49) 46 Average    Part B 52 127 secs.,  1 error (47) 38 Average          Scaled Score Scaled Score Percentile   RBANS Coding: _2 Well Below Average        NAB Attention Module, Form 1: T Score T Score Percentile     Digits Forward 61 51 54 Average    Digits Backwards 47 52 58 Average        D-KEFS Color-Word Interference Test: Raw Score (Scaled Score) Raw Score (Scaled Score) Percentile     Color Naming --- 45 secs. (6) 9 Below Average    Word Reading --- 33  secs. (7) 16 Below Average    Inhibition --- 122 secs. (6) 9 Below Average      Total Errors --- 10 errors (5) 5 Well Below Average    Inhibition/Switching --- 128 secs. (7) 16 Below Average      Total Errors --- 20 errors (1) <1 Exceptionally Low        NAB Executive Functions Module, Form 1: T Score T Score Percentile  Judgment --- 60 84 Above Average        Language:      Verbal Fluency Test: T Score Raw Score (T Score) Percentile     Phonemic Fluency (FAS) 43 29 (46) 34 Average    Animal Fluency 50 22 (63) 91 Well Above Average         NAB Language Module, Form 1: T Score T Score Percentile     Auditory Comprehension --- 56 73 Average    Naming 29/31 (52) 30/31 (62) 88 Above Average        Visuospatial/Visuoconstruction:       Raw Score Raw Score Percentile   Clock Drawing: 8/10 10/10 --- Within Normal Limits  RBANS Line Orientation: 11/20 13/20 --- Below Average         Scaled Score Scaled Score Percentile   WAIS-IV Block Design: --- 11 63 Average        Mood and Personality:       Raw Score Raw Score Percentile   Geriatric Depression Scale: --- 5 --- Within Normal Limits  Geriatric Anxiety Scale: --- 9 --- Minimal    Somatic --- 5 --- Minimal    Cognitive --- 2 --- Minimal    Affective --- 2 --- Minimal        Additional Questionnaires:       Raw Score Raw Score Percentile   PROMIS Sleep Disturbance Questionnaire: --- 29 --- Mild   Informed Consent and Coding/Compliance:   The current evaluation represents a clinical evaluation for the purposes previously outlined by the referral source and is in no way reflective of a forensic evaluation.   Mr. Alcorn was provided with a verbal description of the nature and purpose of the present neuropsychological evaluation. Also reviewed were the foreseeable risks and/or discomforts and benefits of the procedure, limits of confidentiality, and mandatory reporting requirements of this provider. The patient was given the  opportunity to ask questions and receive answers about the evaluation. Oral consent to participate was provided by the patient.   This evaluation was conducted by Christia Reading, Ph.D., ABPP-CN, board certified clinical neuropsychologist. Mr. Nolden completed a clinical interview with Dr. Melvyn Novas, billed as one unit [phone], and 140 minutes of cognitive testing and scoring, billed as one unit [phone] and four additional units [id number]. Psychometrist Milana Kidney, B.S., assisted Dr. Melvyn Novas with test administration and scoring procedures. As a separate and discrete service, Dr. Melvyn Novas spent a total of 160 minutes in interpretation and report writing billed as one unit [phone] and two units [id number].

## 2022-06-28 DIAGNOSIS — I2510 Atherosclerotic heart disease of native coronary artery without angina pectoris: Secondary | ICD-10-CM | POA: Diagnosis not present

## 2022-06-28 DIAGNOSIS — J449 Chronic obstructive pulmonary disease, unspecified: Secondary | ICD-10-CM | POA: Diagnosis not present

## 2022-06-28 DIAGNOSIS — I10 Essential (primary) hypertension: Secondary | ICD-10-CM | POA: Diagnosis not present

## 2022-06-28 DIAGNOSIS — E1169 Type 2 diabetes mellitus with other specified complication: Secondary | ICD-10-CM | POA: Diagnosis not present

## 2022-06-28 DIAGNOSIS — E7800 Pure hypercholesterolemia, unspecified: Secondary | ICD-10-CM | POA: Diagnosis not present

## 2022-06-28 DIAGNOSIS — N183 Chronic kidney disease, stage 3 unspecified: Secondary | ICD-10-CM | POA: Diagnosis not present

## 2022-06-29 ENCOUNTER — Ambulatory Visit (INDEPENDENT_AMBULATORY_CARE_PROVIDER_SITE_OTHER): Payer: Medicare Other | Admitting: Psychology

## 2022-06-29 ENCOUNTER — Encounter: Payer: Medicare Other | Admitting: Psychology

## 2022-06-29 DIAGNOSIS — G3184 Mild cognitive impairment, so stated: Secondary | ICD-10-CM

## 2022-06-29 DIAGNOSIS — I6781 Acute cerebrovascular insufficiency: Secondary | ICD-10-CM

## 2022-06-29 NOTE — Progress Notes (Signed)
   Neuropsychology Feedback Session Tillie Rung. [facility]  Reason for Referral:   Steven Newman is a 81 y.o. right-handed Caucasian male referred by Metta Clines, D.O., to characterize his current cognitive functioning and assist with diagnostic clarity and treatment planning in the context of subjective cognitive decline.   Feedback:   Steven Newman completed a comprehensive neuropsychological evaluation on 06/25/2022. Please refer to that encounter for the full report and recommendations. Briefly, results suggested performance variability across domains of processing speed, executive functioning, visuospatial abilities, and both encoding (i.e., learning) and retrieval aspects of memory. No cognitive domains exhibited consistent impairment. Relative to his previous evaluation in March 2022, performances exhibited a very large degree of stability. The only task which exhibited a measurable decline was a visuoconstructional figure drawing task. However, poor performance on this task was due to poor attention to detail rather than visuospatial impairment as he omitted several aspects entirely. Very slight decline could also be argued across a list learning memory task. However, other memory tasks exhibited evidence for mild improvement if not full stability. All other domains exhibited complete stability. 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 Numerous vascular and complex medical conditions would be expected to produce variability and/or weakness across testing in the exact areas exhibited by Steven Newman. General stability over time would also be expected. As such, this represents the most likely etiology for ongoing dysfunction. It is also worth highlighting that research has shown that individuals with Agent  Orange exposure may be at an increased risk for cognitive decline. The influence of this in his current clinical presentation is plausible but unknown.   Steven Newman was accompanied by his wife during the current feedback session. Content of the current session focused on the results of his neuropsychological evaluation. Steven Newman was given the opportunity to ask questions and his questions were answered. He was encouraged to reach out should additional questions arise. A copy of his report was provided at the conclusion of the visit.      20 minutes were spent conducting the current feedback session with Steven Newman, billed as one unit [phone].

## 2022-06-30 ENCOUNTER — Encounter: Payer: Medicare Other | Admitting: Psychology

## 2022-07-02 ENCOUNTER — Ambulatory Visit (INDEPENDENT_AMBULATORY_CARE_PROVIDER_SITE_OTHER): Payer: Medicare Other | Admitting: Physician Assistant

## 2022-07-02 ENCOUNTER — Encounter: Payer: Self-pay | Admitting: Physician Assistant

## 2022-07-02 VITALS — BP 134/80 | HR 64 | Ht 69.0 in | Wt 210.0 lb

## 2022-07-02 DIAGNOSIS — G3184 Mild cognitive impairment, so stated: Secondary | ICD-10-CM | POA: Diagnosis not present

## 2022-07-02 DIAGNOSIS — I2510 Atherosclerotic heart disease of native coronary artery without angina pectoris: Secondary | ICD-10-CM | POA: Diagnosis not present

## 2022-07-02 NOTE — Progress Notes (Signed)
Assessment/Plan:   Mild Cognitive Impairment, likely vascular  Steven Newman is a very pleasant 81 y.o. RH male with a history of CAD status post MI, COPD, hypertension, hyperlipidemia, CKD, type 2 diabetes mellitus, possible OSA, history of bladder cancer, presenting today in follow-up for evaluation of memory loss.  He carries a diagnosis of mild cognitive impairment.  Recent neurocognitive evaluation in July 2023 indicated that the likely etiology is vascular. However "  research has shown that individuals with Agent Orange exposure may be at an increased risk for cognitive decline. The influence of this in his current clinical presentation is plausible but unknown.".   Recommendations:  Follow up in 1 year  Recommend using hearing aids Recommend good control of the cardiovascular risk factors   Case discussed with Dr. Delice Newman who agrees with the plan     Subjective:   This patient is accompanied in the office by his wife who supplements the history.  Previous records as well as any outside records available were reviewed prior to todays visit.     Any changes in memory since last visit? "Some days are better than other. STM<LTM, he doesn't feel is an issue. Not much change since last year".  Patient lives with: wife  repeats oneself?  Endorsed Disoriented when walking into a room?  Patient denies   Leaving objects in unusual places?  Patient denies   Ambulates  with difficulty?   Patient denies   Recent falls?  Patient denies   Any head injuries?  Patient denies   History of seizures?   Patient denies   Wandering behavior?  Patient denies   Patient drives?  He gets confused driving if he does not rehearse where he has to go" Any mood changes?  Patient denies   Any history of depression?:  Patient may be less patient than before.  Hallucinations?  Patient denies   Paranoia?  Patient denies   Patient reports that he sleeps well without vivid dreams "unless I have something  on my mind", REM behavior or sleepwalking   History of sleep apnea?Endorsed but does not use the machine   Any hygiene concerns?  Patient denies   Independent of bathing and dressing?  Endorsed  Does the patient needs help with medications?  Patient is in charge, wife orders them  Who is in charge of the finances? Wife  is in charge    Any changes in appetite?  Patient denies   Patient have trouble swallowing? Patient denies   Does the patient cook?  Patient denies   Any kitchen accidents such as leaving the stove on? Patient denies   Any headaches?  Patient denies   Double vision? Patient denies   Any focal numbness or tingling?  Patient denies   Chronic back pain Patient denies   Unilateral weakness?  Patient denies   Any tremors?  Patient denies   Any history of anosmia?  Patient denies   Any incontinence of urine? "I take a lot of water and a water pill" -Urology is following  Any bowel dysfunction?   Patient denies       Neurocognitive testing 06/07/2022 Dr. Melvyn Newman Briefly, results suggested performance variability across domains of processing speed, executive functioning, visuospatial abilities, and both encoding (i.e., learning) and retrieval aspects of memory. No cognitive domains exhibited consistent impairment. Relative to his previous evaluation in March 2022, performances exhibited a very large degree of stability. The only task which exhibited a measurable decline was a visuoconstructional figure  drawing task. However, poor performance on this task was due to poor attention to detail rather than visuospatial impairment as he omitted several aspects entirely. Very slight decline could also be argued across a list learning memory task. However, other memory tasks exhibited evidence for mild improvement if not full stability. All other domains exhibited complete stability. 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 Numerous vascular and complex medical conditions would be expected to produce variability and/or weakness across testing in the exact areas exhibited by Steven Newman. General stability over time would also be expected. As such, this represents the most likely etiology for ongoing dysfunction. It is also worth highlighting that research has shown that individuals with Agent Orange exposure may be at an increased risk for cognitive decline. The influence of this in his current clinical presentation is plausible but unknown.    Initial evaluation, Steven Newman Since at least early 2021, his wife has noticed memory problems.  He will sometimes not be aware of things.  For example, if his wife leaves a letter on the counter to be mailed out, he may not place it in the mailbox.  If his wife tells him there is food in the refrigerator, he forgets it is there.  He sometimes forgets the day of the week but he attributes that to being retired and not needing to keep track of the day.  Sometimes he forgets how to get to certain doctor's offices.  However, he says it is because he has many doctors and has to stop and think about where they are located.  He doesn't have trouble driving to his PCP.  He doesn't typically become disoriented driving on familiar routes.  His parents didn't have memory deficits but hey both passed away at age 41.   Also, for about a year, his wife has noticed unsteady gait.  He seems to wobble from side to side when he walks.  He had a fall in January, in which he tripped on the steps.  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  He also has diabetes that has worsened over the past year, requiring medication.  Last Hgb A1c was 7.  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   Myocardial infarction    Neck pain 03/05/2015   Obesity    OSA (obstructive sleep apnea) 08/09/2013    October 2014:AHI of 14 , RDI 33/h and desaturation to 88%  Osteoarthritis of left knee 04/24/2021   Osteoarthritis of right knee 04/24/2021   Pain in joint of left shoulder 01/14/2021   Peripheral arterial disease    Pneumonia    Rotator cuff tear arthropathy 05/14/2021   Skin cancer    Thrombocytopenia    Type 2 diabetes mellitus with peripheral angiopathy    Urinary incontinence    Venous insufficiency (chronic) (peripheral)    Vitamin D deficiency    Wheezing      Past Surgical History:  Procedure Laterality Date   bladder cancer surgery     CARDIAC CATHETERIZATION     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   ASPIRIN LOW DOSE 81 MG EC tablet TAKE 1 TABLET DAILY   atorvastatin (LIPITOR) 40 MG tablet TAKE 1 TABLET BY MOUTH DAILY.   Boswellia-Glucosamine-Vit D (OSTEO BI-FLEX ONE PER DAY PO) Take 2 tablets by mouth daily.   Cholecalciferol (VITAMIN D-3) 125 MCG (5000 UT) TABS Take 5,000 Units by mouth daily.   Coenzyme Q10 (CO Q-10 PO) Take 10 mLs by mouth daily.   JARDIANCE 25 MG TABS tablet Take 25 mg by mouth daily.   mirabegron ER (MYRBETRIQ) 25 MG TB24 tablet Take 25 mg by mouth daily.   Multiple Vitamins-Minerals (AIRBORNE) PACK Take by mouth.   nadolol (CORGARD) 40 MG tablet Take 1 tablet (40 mg total) by mouth daily.   nitroGLYCERIN (NITROSTAT) 0.4 MG SL tablet Place 1 tablet (0.4 mg total) under the tongue every 5 (five) minutes as needed for chest pain.   Tiotropium Bromide-Olodaterol (STIOLTO RESPIMAT) 2.5-2.5 MCG/ACT AERS Inhale 2 puffs into the lungs daily.   TURMERIC PO Take 1,000 mg daily by mouth.   vitamin B-12 (CYANOCOBALAMIN) 1000 MCG tablet Take 1,000 mcg by mouth daily.   albuterol (PROVENTIL) (2.5 MG/3ML) 0.083% nebulizer solution Take 2.5 mg by nebulization every 6 (six) hours as needed for wheezing or shortness of breath. (Patient not taking: Reported on 07/02/2022)   umeclidinium-vilanterol (ANORO ELLIPTA) 62.5-25 MCG/ACT AEPB Inhale 1 puff into the lungs daily. (Patient not taking: Reported on 07/02/2022)   No facility-administered encounter medications on file as of 07/02/2022.     Objective:     PHYSICAL EXAMINATION:    VITALS:   Vitals:   07/02/22 1421  BP: 134/80  Pulse: 64  SpO2: 97%  Weight: 210 lb (95.3 kg)  Height: '5\' 9"'$  (1.753 m)    GEN:  The patient appears stated age and is in NAD. HEENT:  Normocephalic, atraumatic.   Neurological examination:  General: NAD, well-groomed, appears stated age. Orientation: The patient is alert. Oriented to person, place and not to date Cranial nerves: There is good facial symmetry.The speech is fluent and  clear. No aphasia or dysarthria. Fund of knowledge is appropriate. Recent memory impaired and remote memory is normal.  Attention and concentration are normal.  Able to name objects and repeat phrases.  Hearing is intact to conversational tone.    Sensation: Sensation is intact to light touch throughout Motor: Strength is at least antigravity x4. Tremors: none  DTR's  2/4 in UE/LE      03/04/2021    3:00 PM  Montreal Cognitive Assessment   Visuospatial/ Executive (0/5) 2  Naming (0/3) 3  Attention: Read list of digits (0/2) 2  Attention: Read list of letters (0/1) 1  Attention: Serial 7 subtraction starting at 100 (0/3) 3  Language: Repeat phrase (0/2) 0  Language : Fluency (0/1) 0  Abstraction (0/2) 2  Delayed Recall (0/5) 0  Orientation (0/6) 4  Total 17  Adjusted Score (based on education) 18        No data to display             Movement examination: Tone: There is normal tone in the UE/LE Abnormal movements:  no tremor.  No myoclonus.  No asterixis.   Coordination:  There is no decremation with RAM's. Normal finger to nose  Gait and Station: The patient has no difficulty arising out of a deep-seated chair without the use of the hands. The patient's stride length is good.  Gait is cautious and narrow.   Thank you for allowing [place] the opportunity to participate in the care of this nice patient. Please do not hesitate to contact us for any questions or concerns.   Total time spent on today's visit was 30 minutes dedicated to this patient today, preparing to see patient, examining the patient, ordering tests and/or medications and counseling the patient, documenting clinical information in the EHR or other health record, independently interpreting results and communicating results to the patient/family, discussing treatment and goals, answering patient's questions and coordinating care.  Cc:  Leeroy Cha, MD  Sharene Butters 07/03/2022 3:51 PM   Cc:  Leeroy Cha, MD Sharene Butters, PA-C

## 2022-07-02 NOTE — Patient Instructions (Signed)
Follow up in 1 year Recommend to control cardiovascular risk factors Recommend Hearing Aids

## 2022-07-05 ENCOUNTER — Ambulatory Visit (INDEPENDENT_AMBULATORY_CARE_PROVIDER_SITE_OTHER): Payer: Medicare Other | Admitting: Podiatry

## 2022-07-05 ENCOUNTER — Encounter: Payer: Self-pay | Admitting: Podiatry

## 2022-07-05 DIAGNOSIS — N1832 Chronic kidney disease, stage 3b: Secondary | ICD-10-CM | POA: Diagnosis not present

## 2022-07-05 DIAGNOSIS — E1151 Type 2 diabetes mellitus with diabetic peripheral angiopathy without gangrene: Secondary | ICD-10-CM | POA: Diagnosis not present

## 2022-07-05 DIAGNOSIS — E1169 Type 2 diabetes mellitus with other specified complication: Secondary | ICD-10-CM

## 2022-07-05 DIAGNOSIS — B351 Tinea unguium: Secondary | ICD-10-CM | POA: Diagnosis not present

## 2022-07-05 NOTE — Progress Notes (Signed)
This patient returns to my office for at risk foot care.  This patient requires this care by a professional since this patient will be at risk due to having type 2 diabetes, CKD and swelling right foot.  This patient is unable to cut nails himself since the patient cannot reach his nails.These nails are painful walking and wearing shoes. He says he pulled skin out of the outside border right big toe. This patient presents for at risk foot care today.  General Appearance  Alert, conversant and in no acute stress.  Vascular  Dorsalis pedis and posterior tibial  pulses are  weakly palpable  bilaterally.  Capillary return is within normal limits  bilaterally. Temperature is within normal limits  bilaterally.  Neurologic  Senn-Weinstein monofilament wire test within normal limits  bilaterally. Muscle power within normal limits bilaterally.  Nails Thick disfigured discolored nails with subungual debris  from hallux to fifth toes bilaterally. No evidence of bacterial infection or drainage bilaterally. Bleeding from hallux nail lateral border from self inflicted.  Orthopedic  No limitations of motion  feet .  No crepitus or effusions noted.  No bony pathology or digital deformities noted.  DJD 1st MPJ right foot.  Skin  normotropic skin with no porokeratosis noted bilaterally.  No signs of infections or ulcers noted.     Onychomycosis  Pain in right toes  Pain in left toes  Consent was obtained for treatment procedures.   Mechanical debridement of nails 1-5  bilaterally performed with a nail nipper.  Filed with dremel without incident.   Return office visit    3 months                  Told patient to return for periodic foot care and evaluation due to potential at risk complications.   Marye Eagen DPM   

## 2022-07-15 DIAGNOSIS — E119 Type 2 diabetes mellitus without complications: Secondary | ICD-10-CM | POA: Diagnosis not present

## 2022-07-15 DIAGNOSIS — H903 Sensorineural hearing loss, bilateral: Secondary | ICD-10-CM | POA: Diagnosis not present

## 2022-07-15 DIAGNOSIS — Z570 Occupational exposure to noise: Secondary | ICD-10-CM | POA: Diagnosis not present

## 2022-07-15 DIAGNOSIS — H9313 Tinnitus, bilateral: Secondary | ICD-10-CM | POA: Diagnosis not present

## 2022-07-16 DIAGNOSIS — E1169 Type 2 diabetes mellitus with other specified complication: Secondary | ICD-10-CM | POA: Diagnosis not present

## 2022-07-16 DIAGNOSIS — E1151 Type 2 diabetes mellitus with diabetic peripheral angiopathy without gangrene: Secondary | ICD-10-CM | POA: Diagnosis not present

## 2022-07-16 DIAGNOSIS — J449 Chronic obstructive pulmonary disease, unspecified: Secondary | ICD-10-CM | POA: Diagnosis not present

## 2022-07-16 DIAGNOSIS — K649 Unspecified hemorrhoids: Secondary | ICD-10-CM | POA: Diagnosis not present

## 2022-07-16 DIAGNOSIS — I252 Old myocardial infarction: Secondary | ICD-10-CM | POA: Diagnosis not present

## 2022-07-16 DIAGNOSIS — H903 Sensorineural hearing loss, bilateral: Secondary | ICD-10-CM | POA: Diagnosis not present

## 2022-09-06 ENCOUNTER — Other Ambulatory Visit: Payer: Self-pay | Admitting: Interventional Cardiology

## 2022-09-28 DIAGNOSIS — N1832 Chronic kidney disease, stage 3b: Secondary | ICD-10-CM | POA: Diagnosis not present

## 2022-09-30 DIAGNOSIS — Z23 Encounter for immunization: Secondary | ICD-10-CM | POA: Diagnosis not present

## 2022-10-04 DIAGNOSIS — I252 Old myocardial infarction: Secondary | ICD-10-CM | POA: Diagnosis not present

## 2022-10-04 DIAGNOSIS — N1832 Chronic kidney disease, stage 3b: Secondary | ICD-10-CM | POA: Diagnosis not present

## 2022-10-04 DIAGNOSIS — I2510 Atherosclerotic heart disease of native coronary artery without angina pectoris: Secondary | ICD-10-CM | POA: Diagnosis not present

## 2022-10-04 DIAGNOSIS — I129 Hypertensive chronic kidney disease with stage 1 through stage 4 chronic kidney disease, or unspecified chronic kidney disease: Secondary | ICD-10-CM | POA: Diagnosis not present

## 2022-10-04 DIAGNOSIS — E875 Hyperkalemia: Secondary | ICD-10-CM | POA: Diagnosis not present

## 2022-10-04 DIAGNOSIS — G4733 Obstructive sleep apnea (adult) (pediatric): Secondary | ICD-10-CM | POA: Diagnosis not present

## 2022-10-04 DIAGNOSIS — E1122 Type 2 diabetes mellitus with diabetic chronic kidney disease: Secondary | ICD-10-CM | POA: Diagnosis not present

## 2022-10-06 ENCOUNTER — Ambulatory Visit: Payer: Medicare Other | Admitting: Podiatry

## 2022-10-20 DIAGNOSIS — H838X3 Other specified diseases of inner ear, bilateral: Secondary | ICD-10-CM | POA: Diagnosis not present

## 2022-10-20 DIAGNOSIS — H903 Sensorineural hearing loss, bilateral: Secondary | ICD-10-CM | POA: Diagnosis not present

## 2022-10-20 DIAGNOSIS — H9313 Tinnitus, bilateral: Secondary | ICD-10-CM | POA: Diagnosis not present

## 2022-10-21 ENCOUNTER — Other Ambulatory Visit: Payer: Self-pay | Admitting: Otolaryngology

## 2022-10-21 DIAGNOSIS — H918X3 Other specified hearing loss, bilateral: Secondary | ICD-10-CM

## 2022-10-26 ENCOUNTER — Encounter: Payer: Self-pay | Admitting: Podiatry

## 2022-10-26 ENCOUNTER — Ambulatory Visit (INDEPENDENT_AMBULATORY_CARE_PROVIDER_SITE_OTHER): Payer: Medicare Other | Admitting: Podiatry

## 2022-10-26 DIAGNOSIS — N1832 Chronic kidney disease, stage 3b: Secondary | ICD-10-CM

## 2022-10-26 DIAGNOSIS — E1151 Type 2 diabetes mellitus with diabetic peripheral angiopathy without gangrene: Secondary | ICD-10-CM

## 2022-10-26 DIAGNOSIS — B351 Tinea unguium: Secondary | ICD-10-CM

## 2022-10-26 DIAGNOSIS — E1169 Type 2 diabetes mellitus with other specified complication: Secondary | ICD-10-CM

## 2022-10-26 NOTE — Progress Notes (Signed)
This patient returns to my office for at risk foot care.  This patient requires this care by a professional since this patient will be at risk due to having type 2 diabetes, CKD and swelling right foot.  This patient is unable to cut nails himself since the patient cannot reach his nails.These nails are painful walking and wearing shoes. He says he pulled skin out of the outside border right big toe. This patient presents for at risk foot care today.  General Appearance  Alert, conversant and in no acute stress.  Vascular  Dorsalis pedis and posterior tibial  pulses are  weakly palpable  bilaterally.  Capillary return is within normal limits  bilaterally. Temperature is within normal limits  bilaterally.  Neurologic  Senn-Weinstein monofilament wire test within normal limits  bilaterally. Muscle power within normal limits bilaterally.  Nails Thick disfigured discolored nails with subungual debris  from hallux to fifth toes bilaterally. No evidence of bacterial infection or drainage bilaterally. Bleeding from hallux nail lateral border from self inflicted.  Orthopedic  No limitations of motion  feet .  No crepitus or effusions noted.  No bony pathology or digital deformities noted.  DJD 1st MPJ right foot.  Skin  normotropic skin with no porokeratosis noted bilaterally.  No signs of infections or ulcers noted.     Onychomycosis  Pain in right toes  Pain in left toes  Consent was obtained for treatment procedures.   Mechanical debridement of nails 1-5  bilaterally performed with a nail nipper.  Filed with dremel without incident.   Return office visit    3 months                  Told patient to return for periodic foot care and evaluation due to potential at risk complications.   Gardiner Barefoot DPM

## 2022-11-09 DIAGNOSIS — G4733 Obstructive sleep apnea (adult) (pediatric): Secondary | ICD-10-CM | POA: Diagnosis not present

## 2022-11-09 DIAGNOSIS — I10 Essential (primary) hypertension: Secondary | ICD-10-CM | POA: Diagnosis not present

## 2022-11-09 DIAGNOSIS — I2510 Atherosclerotic heart disease of native coronary artery without angina pectoris: Secondary | ICD-10-CM | POA: Diagnosis not present

## 2022-11-09 DIAGNOSIS — E1169 Type 2 diabetes mellitus with other specified complication: Secondary | ICD-10-CM | POA: Diagnosis not present

## 2022-11-09 DIAGNOSIS — Z0000 Encounter for general adult medical examination without abnormal findings: Secondary | ICD-10-CM | POA: Diagnosis not present

## 2022-11-09 DIAGNOSIS — Z1331 Encounter for screening for depression: Secondary | ICD-10-CM | POA: Diagnosis not present

## 2022-11-09 DIAGNOSIS — E7800 Pure hypercholesterolemia, unspecified: Secondary | ICD-10-CM | POA: Diagnosis not present

## 2022-11-09 DIAGNOSIS — J449 Chronic obstructive pulmonary disease, unspecified: Secondary | ICD-10-CM | POA: Diagnosis not present

## 2022-11-14 ENCOUNTER — Ambulatory Visit
Admission: RE | Admit: 2022-11-14 | Discharge: 2022-11-14 | Disposition: A | Discharge status: Home / Self Care | Payer: Medicare Other | Source: Ambulatory Visit | Attending: Otolaryngology | Admitting: Otolaryngology

## 2022-11-14 DIAGNOSIS — H918X3 Other specified hearing loss, bilateral: Secondary | ICD-10-CM | POA: Diagnosis not present

## 2022-11-14 MED ORDER — GADOPICLENOL 0.5 MMOL/ML IV SOLN
10.0000 mL | Freq: Once | INTRAVENOUS | Status: AC | PRN
  Administered 2022-11-14: 10 mL via INTRAVENOUS

## 2022-12-28 DIAGNOSIS — M150 Primary generalized (osteo)arthritis: Secondary | ICD-10-CM | POA: Diagnosis not present

## 2022-12-28 DIAGNOSIS — I2510 Atherosclerotic heart disease of native coronary artery without angina pectoris: Secondary | ICD-10-CM | POA: Diagnosis not present

## 2022-12-28 DIAGNOSIS — I10 Essential (primary) hypertension: Secondary | ICD-10-CM | POA: Diagnosis not present

## 2022-12-28 DIAGNOSIS — E1169 Type 2 diabetes mellitus with other specified complication: Secondary | ICD-10-CM | POA: Diagnosis not present

## 2022-12-28 DIAGNOSIS — E7800 Pure hypercholesterolemia, unspecified: Secondary | ICD-10-CM | POA: Diagnosis not present

## 2022-12-28 DIAGNOSIS — J449 Chronic obstructive pulmonary disease, unspecified: Secondary | ICD-10-CM | POA: Diagnosis not present

## 2023-01-05 DIAGNOSIS — N3941 Urge incontinence: Secondary | ICD-10-CM | POA: Diagnosis not present

## 2023-01-05 DIAGNOSIS — R350 Frequency of micturition: Secondary | ICD-10-CM | POA: Diagnosis not present

## 2023-01-26 ENCOUNTER — Ambulatory Visit: Payer: Medicare Other | Admitting: Podiatry

## 2023-01-30 NOTE — Progress Notes (Unsigned)
Cardiology Office Note   Date:  01/31/2023   ID:  Steven Newman, DOB [birth year], MRN [id number]  PCP:  Leeroy Cha, MD    No chief complaint on file.  CAD  Wt Readings from Last 3 Encounters:  01/31/23 205 lb 9.6 oz (93.3 kg)  07/02/22 210 lb (95.3 kg)  03/23/22 210 lb (95.3 kg)       History of Present Illness: Steven Newman is a 82 y.o. male  with CAD with several MIs in the past and a PTCA in the mid 90s.    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  He has been treated medically due to lack of claudication.   In the past, He reported chronic exertional dyspnea which limits his activities.  He is actually more limited by this than anything else.     Hospitalized in [place] in 2017 for COPD.    Over the years, his memory has gotten worse.  Has had occasional dizzy spell when he stands up.    Followed by pulmonary for COPD/emphysema.  Has not used albuterol recently.  He had all three COVID shots and a flu shot.  No problems.     Leg swelling is fairly new since his shoulder replacement surgery.   EF 45-50%.  Normal RV function.  Getting compression stockings.    Denies : Chest pain. Dizziness. Leg edema. Nitroglycerin use. Orthopnea. Palpitations. Paroxysmal nocturnal dyspnea. Shortness of breath. Syncope.    Not walking.  No claudication.  No nonhealing sores.   Balance is off.  Careful not to fall.  Has a walker and cane.  Last fall was in 11/23.  He tripped in the yard while carrying groceries.   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   Myocardial infarction    Neck pain 03/05/2015   Obesity    OSA (obstructive sleep apnea) 08/09/2013    October 2014:AHI of 14 , RDI 33/h and desaturation to 88%    Osteoarthritis of left knee 04/24/2021   Osteoarthritis of right knee 04/24/2021   Pain in joint of left shoulder 01/14/2021   Peripheral arterial disease    Pneumonia    Rotator cuff tear arthropathy 05/14/2021   Skin cancer    Thrombocytopenia    Type 2 diabetes mellitus with peripheral angiopathy    Urinary incontinence    Venous insufficiency (chronic) (peripheral)    Vitamin D deficiency    Wheezing     Past Surgical History:  Procedure Laterality Date   bladder cancer surgery     CARDIAC CATHETERIZATION     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     albuterol (PROVENTIL) (2.5 MG/3ML) 0.083% nebulizer solution Take 2.5 mg by nebulization every 6 (six) hours as needed for wheezing or shortness of breath.     ASPIRIN LOW DOSE 81 MG tablet TAKE 1 TABLET DAILY 90 tablet 3   atorvastatin (LIPITOR) 40 MG tablet TAKE 1 TABLET  BY MOUTH DAILY. 90 tablet 3   Boswellia-Glucosamine-Vit D (OSTEO BI-FLEX ONE PER DAY PO) Take 2 tablets by mouth daily.     Cholecalciferol (VITAMIN D-3) 125 MCG (5000 UT) TABS Take 5,000 Units by mouth daily.     Coenzyme Q10 (CO Q-10 PO) Take 10 mLs by mouth daily.     JARDIANCE 25 MG TABS tablet Take 25 mg by mouth daily.     mirabegron ER (MYRBETRIQ) 25 MG TB24 tablet Take 25 mg by mouth daily.     Multiple Vitamins-Minerals (AIRBORNE) PACK Take by mouth.     nadolol (CORGARD) 40 MG tablet Take 1 tablet (40 mg total) by mouth daily. 90 tablet 3   nitroGLYCERIN (NITROSTAT) 0.4 MG SL tablet Place 1 tablet (0.4 mg total) under the tongue every 5 (five) minutes as needed for chest pain. 25 tablet 3   Tiotropium Bromide-Olodaterol (STIOLTO RESPIMAT) 2.5-2.5 MCG/ACT AERS Inhale 2 puffs into the lungs daily. 4 g 11   TURMERIC PO Take 1,000 mg daily by mouth.     vitamin B-12 (CYANOCOBALAMIN) 1000 MCG tablet Take 1,000 mcg by mouth daily.     umeclidinium-vilanterol (ANORO ELLIPTA) 62.5-25 MCG/ACT AEPB Inhale 1 puff into the lungs daily. (Patient not taking: Reported on 01/31/2023) 2 each 0   No current facility-administered medications for this visit.    Allergies:   Ace inhibitors, Amoxicillin-pot clavulanate, Other, and Penicillins    Social History:  The patient  reports that he quit smoking about 7 years ago. His smoking use included cigarettes. He has a 100.00 pack-year smoking history. He has never used smokeless tobacco. He reports current alcohol use. He reports that he does not use drugs.   Family History:  The patient's family history includes Cancer in his sister; Diabetes in his sister; Heart disease in his mother; Rheumatic fever in his brother and father.    ROS:  Please see the history of present illness.   Otherwise, review of systems are positive for left shoulder limited range of motion.   All other systems are reviewed and negative.    PHYSICAL EXAM: VS:  BP (!) 148/76    Pulse 64   Ht '5\' 9"'$  (1.753 m)   Wt 205 lb 9.6 oz (93.3 kg)   SpO2 95%   BMI 30.36 kg/m  , BMI Body mass index is 30.36 kg/m. GEN: Well nourished, well developed, in no acute distress HEENT: normal Neck: no JVD, carotid bruits, or masses Cardiac: RRR; no murmurs, rubs, or gallops,no edema  Respiratory:  clear to auscultation bilaterally, normal work of breathing GI: soft, nontender, nondistended, + BS MS: no deformity or  atrophy Skin: warm and dry, no rash Neuro:  Strength and sensation are intact Psych: euthymic mood, full affect   EKG:   The ekg ordered today demonstrates NSR, RBBB, PRWP   Recent Labs: No results found for requested labs within last 365 days.   Lipid Panel    Component Value Date/Time   CHOL 159 12/23/2014 0923   TRIG 129 12/23/2014 0923   HDL 45 12/23/2014 0923   LDLCALC 88 12/23/2014 0923     Other studies Reviewed: Additional studies/ records that were reviewed today with results demonstrating: Labs reviewed.   ASSESSMENT AND PLAN:  CAD/old MI: No angina.  Continue aggressive secondary prevention.  No indication for ischemic testing at this time.  PAD: Follows with Dr.Arida.  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  Claudication has been managed medically and symptoms have been controlled. Diabetes: A1c 6.8 in December 2023.  Eating out more.   Hypertension: [id number] systolic at home.   Abdominal aortic aneurysm: Aortic atherosclerosis.  Continue atorvastatin.  3.4 cm in 2023.   Chronic renal insufficiency: Cr 1.59 at last check.  Stay well-hydrated.  Avoid nephrotoxins. He felt like he had some problems with his left shoulder after having to position his elbow while lying on his left side 2022.   Current medicines are reviewed at length with the patient today.  The patient concerns regarding his medicines were addressed.  The following changes have been made:  No change  Labs/ tests ordered today include:  No orders  of the defined types were placed in this encounter.   Recommend 150 minutes/week of aerobic exercise Low fat, low carb, high fiber diet recommended  Disposition:   FU in 1 year   Signed, Larae Grooms, MD  01/31/2023 11:01 AM    [facility] [place], [place], [place]  [address] Phone: [phone]; Fax: [phone]

## 2023-01-31 ENCOUNTER — Encounter: Payer: Self-pay | Admitting: Interventional Cardiology

## 2023-01-31 ENCOUNTER — Ambulatory Visit: Payer: Medicare Other | Attending: Interventional Cardiology | Admitting: Interventional Cardiology

## 2023-01-31 VITALS — BP 148/76 | HR 64 | Ht 69.0 in | Wt 205.6 lb

## 2023-01-31 DIAGNOSIS — I10 Essential (primary) hypertension: Secondary | ICD-10-CM

## 2023-01-31 DIAGNOSIS — E785 Hyperlipidemia, unspecified: Secondary | ICD-10-CM | POA: Diagnosis not present

## 2023-01-31 DIAGNOSIS — E119 Type 2 diabetes mellitus without complications: Secondary | ICD-10-CM | POA: Diagnosis not present

## 2023-01-31 DIAGNOSIS — I252 Old myocardial infarction: Secondary | ICD-10-CM | POA: Diagnosis not present

## 2023-01-31 DIAGNOSIS — I714 Abdominal aortic aneurysm, without rupture, unspecified: Secondary | ICD-10-CM

## 2023-01-31 DIAGNOSIS — I739 Peripheral vascular disease, unspecified: Secondary | ICD-10-CM

## 2023-01-31 DIAGNOSIS — I2510 Atherosclerotic heart disease of native coronary artery without angina pectoris: Secondary | ICD-10-CM

## 2023-01-31 NOTE — Patient Instructions (Signed)

## 2023-02-01 ENCOUNTER — Ambulatory Visit (HOSPITAL_BASED_OUTPATIENT_CLINIC_OR_DEPARTMENT_OTHER)
Admission: RE | Admit: 2023-02-01 | Discharge: 2023-02-01 | Disposition: A | Discharge status: Home / Self Care | Payer: Medicare Other | Source: Ambulatory Visit | Attending: Cardiovascular Disease | Admitting: Cardiovascular Disease

## 2023-02-01 ENCOUNTER — Ambulatory Visit (HOSPITAL_COMMUNITY)
Admission: RE | Admit: 2023-02-01 | Discharge: 2023-02-01 | Disposition: A | Discharge status: Home / Self Care | Payer: Medicare Other | Source: Ambulatory Visit | Attending: Cardiovascular Disease | Admitting: Cardiovascular Disease

## 2023-02-01 DIAGNOSIS — I739 Peripheral vascular disease, unspecified: Secondary | ICD-10-CM | POA: Insufficient documentation

## 2023-02-01 DIAGNOSIS — I723 Aneurysm of iliac artery: Secondary | ICD-10-CM

## 2023-02-01 DIAGNOSIS — I77811 Abdominal aortic ectasia: Secondary | ICD-10-CM | POA: Diagnosis not present

## 2023-02-01 LAB — VAS US ABI WITH/WO TBI
Left ABI: 0.84
Right ABI: 0.66

## 2023-02-02 ENCOUNTER — Other Ambulatory Visit: Payer: Self-pay

## 2023-02-02 DIAGNOSIS — I714 Abdominal aortic aneurysm, without rupture, unspecified: Secondary | ICD-10-CM

## 2023-02-03 NOTE — Addendum Note (Signed)
Addended by: Ricci Barker on: 02/03/2023 11:28 AM   Modules accepted: Orders

## 2023-03-01 DIAGNOSIS — N1832 Chronic kidney disease, stage 3b: Secondary | ICD-10-CM | POA: Diagnosis not present

## 2023-03-07 DIAGNOSIS — E785 Hyperlipidemia, unspecified: Secondary | ICD-10-CM | POA: Diagnosis not present

## 2023-03-07 DIAGNOSIS — E1122 Type 2 diabetes mellitus with diabetic chronic kidney disease: Secondary | ICD-10-CM | POA: Diagnosis not present

## 2023-03-07 DIAGNOSIS — I129 Hypertensive chronic kidney disease with stage 1 through stage 4 chronic kidney disease, or unspecified chronic kidney disease: Secondary | ICD-10-CM | POA: Diagnosis not present

## 2023-03-07 DIAGNOSIS — N1832 Chronic kidney disease, stage 3b: Secondary | ICD-10-CM | POA: Diagnosis not present

## 2023-03-07 DIAGNOSIS — I2510 Atherosclerotic heart disease of native coronary artery without angina pectoris: Secondary | ICD-10-CM | POA: Diagnosis not present

## 2023-03-07 DIAGNOSIS — E875 Hyperkalemia: Secondary | ICD-10-CM | POA: Diagnosis not present

## 2023-03-08 ENCOUNTER — Encounter: Payer: Self-pay | Admitting: Podiatry

## 2023-03-08 ENCOUNTER — Ambulatory Visit (INDEPENDENT_AMBULATORY_CARE_PROVIDER_SITE_OTHER): Payer: Medicare Other | Admitting: Podiatry

## 2023-03-08 DIAGNOSIS — B351 Tinea unguium: Secondary | ICD-10-CM | POA: Diagnosis not present

## 2023-03-08 DIAGNOSIS — E1151 Type 2 diabetes mellitus with diabetic peripheral angiopathy without gangrene: Secondary | ICD-10-CM | POA: Diagnosis not present

## 2023-03-08 DIAGNOSIS — E1169 Type 2 diabetes mellitus with other specified complication: Secondary | ICD-10-CM | POA: Diagnosis not present

## 2023-03-08 NOTE — Progress Notes (Signed)
This patient returns to my office for at risk foot care.  This patient requires this care by a professional since this patient will be at risk due to having type 2 diabetes, CKD and swelling right foot.  This patient is unable to cut nails himself since the patient cannot reach his nails.These nails are painful walking and wearing shoes. He says he pulled skin out of the outside border right big toe. This patient presents for at risk foot care today.  General Appearance  Alert, conversant and in no acute stress.  Vascular  Dorsalis pedis and posterior tibial  pulses are  weakly palpable  bilaterally.  Capillary return is within normal limits  bilaterally. Temperature is within normal limits  bilaterally.  Neurologic  Senn-Weinstein monofilament wire test within normal limits  bilaterally. Muscle power within normal limits bilaterally.  Nails Thick disfigured discolored nails with subungual debris  from hallux to fifth toes bilaterally. No evidence of bacterial infection or drainage bilaterally. Bleeding from hallux nail lateral border from self inflicted.  Orthopedic  No limitations of motion  feet .  No crepitus or effusions noted.  No bony pathology or digital deformities noted.  DJD 1st MPJ right foot.  Skin  normotropic skin with no porokeratosis noted bilaterally.  No signs of infections or ulcers noted.     Onychomycosis  Pain in right toes  Pain in left toes  Consent was obtained for treatment procedures.   Mechanical debridement of nails 1-5  bilaterally performed with a nail nipper.  Filed with dremel without incident.   Return office visit    3 months                  Told patient to return for periodic foot care and evaluation due to potential at risk complications.   Deashia Soule DPM   

## 2023-03-10 DIAGNOSIS — E669 Obesity, unspecified: Secondary | ICD-10-CM | POA: Diagnosis not present

## 2023-03-10 DIAGNOSIS — E1151 Type 2 diabetes mellitus with diabetic peripheral angiopathy without gangrene: Secondary | ICD-10-CM | POA: Diagnosis not present

## 2023-03-10 DIAGNOSIS — I2510 Atherosclerotic heart disease of native coronary artery without angina pectoris: Secondary | ICD-10-CM | POA: Diagnosis not present

## 2023-05-10 ENCOUNTER — Ambulatory Visit: Payer: Medicare Other | Admitting: Cardiovascular Disease

## 2023-05-31 ENCOUNTER — Ambulatory Visit: Payer: Medicare Other | Admitting: Cardiovascular Disease

## 2023-06-01 DIAGNOSIS — E1151 Type 2 diabetes mellitus with diabetic peripheral angiopathy without gangrene: Secondary | ICD-10-CM | POA: Diagnosis not present

## 2023-06-01 DIAGNOSIS — I2510 Atherosclerotic heart disease of native coronary artery without angina pectoris: Secondary | ICD-10-CM | POA: Diagnosis not present

## 2023-06-07 ENCOUNTER — Ambulatory Visit (INDEPENDENT_AMBULATORY_CARE_PROVIDER_SITE_OTHER): Payer: Medicare Other | Admitting: Podiatry

## 2023-06-07 ENCOUNTER — Encounter: Payer: Self-pay | Admitting: Podiatry

## 2023-06-07 DIAGNOSIS — B351 Tinea unguium: Secondary | ICD-10-CM

## 2023-06-07 DIAGNOSIS — E1151 Type 2 diabetes mellitus with diabetic peripheral angiopathy without gangrene: Secondary | ICD-10-CM | POA: Diagnosis not present

## 2023-06-07 NOTE — Progress Notes (Signed)
This patient returns to my office for at risk foot care.  This patient requires this care by a professional since this patient will be at risk due to having type 2 diabetes, CKD and swelling right foot.  This patient is unable to cut nails himself since the patient cannot reach his nails.These nails are painful walking and wearing shoes. He says he pulled skin out of the outside border right big toe. This patient presents for at risk foot care today.  General Appearance  Alert, conversant and in no acute stress.  Vascular  Dorsalis pedis and posterior tibial  pulses are  weakly palpable  bilaterally.  Capillary return is within normal limits  bilaterally. Temperature is within normal limits  bilaterally.  Neurologic  Senn-Weinstein monofilament wire test within normal limits  bilaterally. Muscle power within normal limits bilaterally.  Nails Thick disfigured discolored nails with subungual debris  from hallux to fifth toes bilaterally. No evidence of bacterial infection or drainage bilaterally. Bleeding from hallux nail lateral border from self inflicted.  Orthopedic  No limitations of motion  feet .  No crepitus or effusions noted.  No bony pathology or digital deformities noted.  DJD 1st MPJ right foot.  Skin  normotropic skin with no porokeratosis noted bilaterally.  No signs of infections or ulcers noted.     Onychomycosis  Pain in right toes  Pain in left toes  Consent was obtained for treatment procedures.   Mechanical debridement of nails 1-5  bilaterally performed with a nail nipper.  Filed with dremel without incident.   Return office visit    3 months                  Told patient to return for periodic foot care and evaluation due to potential at risk complications.   Jadyn Brasher DPM   

## 2023-06-21 DIAGNOSIS — I129 Hypertensive chronic kidney disease with stage 1 through stage 4 chronic kidney disease, or unspecified chronic kidney disease: Secondary | ICD-10-CM | POA: Diagnosis not present

## 2023-06-21 DIAGNOSIS — J3489 Other specified disorders of nose and nasal sinuses: Secondary | ICD-10-CM | POA: Diagnosis not present

## 2023-06-21 DIAGNOSIS — E785 Hyperlipidemia, unspecified: Secondary | ICD-10-CM | POA: Diagnosis not present

## 2023-06-21 DIAGNOSIS — E875 Hyperkalemia: Secondary | ICD-10-CM | POA: Diagnosis not present

## 2023-06-21 DIAGNOSIS — I2510 Atherosclerotic heart disease of native coronary artery without angina pectoris: Secondary | ICD-10-CM | POA: Diagnosis not present

## 2023-06-21 DIAGNOSIS — N1832 Chronic kidney disease, stage 3b: Secondary | ICD-10-CM | POA: Diagnosis not present

## 2023-06-21 DIAGNOSIS — E1122 Type 2 diabetes mellitus with diabetic chronic kidney disease: Secondary | ICD-10-CM | POA: Diagnosis not present

## 2023-06-28 ENCOUNTER — Ambulatory Visit: Payer: Medicare Other | Attending: Cardiovascular Disease | Admitting: Cardiovascular Disease

## 2023-06-28 ENCOUNTER — Encounter: Payer: Self-pay | Admitting: Cardiovascular Disease

## 2023-06-28 VITALS — BP 142/72 | HR 53 | Ht 69.0 in | Wt 212.4 lb

## 2023-06-28 DIAGNOSIS — I2510 Atherosclerotic heart disease of native coronary artery without angina pectoris: Secondary | ICD-10-CM | POA: Diagnosis not present

## 2023-06-28 DIAGNOSIS — I714 Abdominal aortic aneurysm, without rupture, unspecified: Secondary | ICD-10-CM | POA: Diagnosis not present

## 2023-06-28 DIAGNOSIS — I739 Peripheral vascular disease, unspecified: Secondary | ICD-10-CM | POA: Diagnosis not present

## 2023-06-28 DIAGNOSIS — E785 Hyperlipidemia, unspecified: Secondary | ICD-10-CM

## 2023-06-28 DIAGNOSIS — R0989 Other specified symptoms and signs involving the circulatory and respiratory systems: Secondary | ICD-10-CM

## 2023-06-28 NOTE — Patient Instructions (Signed)
Medication Instructions:  No changes *If you need a refill on your cardiac medications before your next appointment, please call your pharmacy*   Lab Work: None ordered If you have labs (blood work) drawn today and your tests are completely normal, you will receive your results only by: MyChart Message (if you have MyChart) OR A paper copy in the mail If you have any lab test that is abnormal or we need to change your treatment, we will call you to review the results.   Testing/Procedures: Your physician has requested that you have a carotid duplex. This test is an ultrasound of the carotid arteries in your neck. It looks at blood flow through these arteries that supply the brain with blood. Allow one hour for this exam. There are no restrictions or special instructions. This will take place at [address] [facility], [address].    Follow-Up: At [facility], you and your health needs are our priority.  As part of our continuing mission to provide you with exceptional heart care, we have created designated Provider Care Teams.  These Care Teams include your primary Cardiologist (physician) and Advanced Practice Providers (APPs -  Physician Assistants and Nurse Practitioners) who all work together to provide you with the care you need, when you need it.  We recommend signing up for the patient portal called "MyChart".  Sign up information is provided on this After Visit Summary.  MyChart is used to connect with patients for Virtual Visits (Telemedicine).  Patients are able to view lab/test results, encounter notes, upcoming appointments, etc.  Non-urgent messages can be sent to your provider as well.   To learn more about what you can do with MyChart, go to [email or web address].    Your next appointment:   12 month(s)  Provider:   Dr. Kirke Corin

## 2023-06-28 NOTE — Progress Notes (Signed)
Cardiology Office Note   Date:  06/28/2023   ID:  DOUG Newman, DOB Apr 27, 1941, MRN [id number]  PCP:  Steven Ishihara, MD  Cardiologist:  Dr. Eldridge Dace  No chief complaint on file.     History of Present Illness: Steven Newman is a 82 y.o. male who is here today for a follow-up visit regarding peripheral arterial disease.  He has known history of coronary artery disease status post MI and multiple PCI in the mid 90s. No recent revascularization. He is a previous smoker and quit in 2016. Other chronic medical conditions include hypertension, hyperlipidemia and obesity.  He has known history of abdominal aortic aneurysm, right common iliac artery aneurysm as well as peripheral arterial disease.   Most recent vascular studies in February showed an ABI of 0.66 on the right and 0.84 on the left.  Duplex showed stable size abdominal aortic aneurysm at 3.4 cm and stable size right common iliac artery aneurysm at 2.3 cm.  He has occasional discomfort in his left leg with walking but not on the right side.  This does not seem to be due to PAD as he has palpable pulses on the left.  No chest pain or worsening dyspnea.  He reports controlled blood pressure at home.  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   Myocardial infarction    Neck pain 03/05/2015   Obesity    OSA (obstructive sleep apnea) 08/09/2013    October 2014:AHI of 14 , RDI 33/h and desaturation to 88%    Osteoarthritis of left knee 04/24/2021   Osteoarthritis of right knee 04/24/2021   Pain in joint of left shoulder 01/14/2021   Peripheral arterial disease    Pneumonia    Rotator cuff tear arthropathy 05/14/2021   Skin cancer    Thrombocytopenia    Type 2 diabetes mellitus with peripheral angiopathy    Urinary incontinence    Venous insufficiency (chronic) (peripheral)    Vitamin D deficiency    Wheezing     Past Surgical History:  Procedure Laterality Date   bladder cancer surgery     CARDIAC CATHETERIZATION     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     albuterol (PROVENTIL) (2.5 MG/3ML) 0.083% nebulizer solution Take 2.5 mg by nebulization every 6 (six) hours as needed for wheezing or shortness of breath.     ASPIRIN LOW DOSE 81 MG tablet TAKE 1 TABLET DAILY 90 tablet 3   atorvastatin (LIPITOR) 40 MG tablet TAKE 1 TABLET BY MOUTH DAILY. 90 tablet 3   Boswellia-Glucosamine-Vit D (OSTEO BI-FLEX ONE PER DAY PO) Take 2 tablets by mouth daily.     Cholecalciferol (VITAMIN D-3) 125 MCG (5000 UT) TABS Take 5,000 Units by mouth daily.     Coenzyme Q10 (CO Q-10 PO) Take 10 mLs by mouth daily.     mirabegron ER (MYRBETRIQ) 25 MG TB24 tablet Take 25 mg by mouth  daily.     Multiple Vitamins-Minerals (AIRBORNE) PACK Take by mouth.     nadolol (CORGARD) 40 MG tablet Take 1 tablet (40 mg total) by mouth daily. 90 tablet 3   nitroGLYCERIN (NITROSTAT) 0.4 MG SL tablet Place 1 tablet (0.4 mg total) under the tongue every 5 (five) minutes as needed for chest pain. 25 tablet 3   Tiotropium Bromide-Olodaterol (STIOLTO RESPIMAT) 2.5-2.5 MCG/ACT AERS Inhale 2 puffs into the lungs daily. 4 g 11   TURMERIC PO Take 1,350 mg by mouth daily.     umeclidinium-vilanterol (ANORO ELLIPTA) 62.5-25 MCG/ACT AEPB Inhale 1 puff into the lungs daily. 2 each 0   vitamin B-12 (CYANOCOBALAMIN) 1000 MCG tablet Take 1,000 mcg by mouth daily.     No current facility-administered medications for this visit.    Allergies:   Ace inhibitors, Amoxicillin-pot clavulanate, Other, and Penicillins    Social History:  The patient  reports that he quit smoking about 8 years ago. His smoking use included cigarettes. He started smoking about 58 years ago. He has a 100 pack-year smoking history. He has never used smokeless tobacco. He reports current alcohol use. He reports that he does not use drugs.   Family History:  The patient's family history includes Cancer in his sister; Diabetes in his sister; Heart disease in his mother; Rheumatic fever in his brother and father.    ROS:  Please see the history of present illness.   Otherwise, review of systems are positive for none.   All other systems are reviewed and negative.    PHYSICAL EXAM: VS:  BP (!) 142/72 (BP Location: Left Arm, Patient Position: Sitting, Cuff Size: Normal)   Pulse (!) 53   Ht 5\' 9"  (1.753 m)   Wt 212 lb 6.4 oz (96.3 kg)   SpO2 98%   BMI 31.37 kg/m  , BMI Body mass index is 31.37 kg/m. GEN: Well nourished, well developed, in no acute distress  HEENT: normal  Neck: no JVD or masses.  Right carotid bruit Cardiac: RRR; no rubs, or gallops,no edema .  1 / 6 systolic murmur in the aortic area.   Respiratory:  clear to  auscultation bilaterally, normal work of breathing GI: soft, nontender, nondistended, + BS MS: no deformity or atrophy  Skin: warm and dry, no rash Neuro:  Strength and sensation are intact Psych: euthymic mood, full affect Vascular: Femoral pulse: Slightly diminished bilaterally.  Distal pulses are palpable on the left but not the right   EKG:  EKG is not ordered today.    Recent Labs: No results found for requested labs within last 365 days.    Lipid Panel    Component Value Date/Time   CHOL 159 12/23/2014 0923  TRIG 129 12/23/2014 0923   HDL 45 12/23/2014 0923   LDLCALC 88 12/23/2014 0923      Wt Readings from Last 3 Encounters:  06/28/23 212 lb 6.4 oz (96.3 kg)  01/31/23 205 lb 9.6 oz (93.3 kg)  07/02/22 210 lb (95.3 kg)          01/25/2017    9:25 AM  PAD Screen  Previous PAD dx? No  Previous surgical procedure? Yes  Dates of procedures hx of bladder cancer  Pain with walking? Yes  Subsides with rest? Yes  Feet/toe relief with dangling? No  Painful, non-healing ulcers? No  Extremities discolored? Yes      ASSESSMENT AND PLAN:  1.  Abdominal aortic/right common iliac artery aneurysm: These have been stable in size and below threshold for repair.  The patient continues to be asymptomatic.  Repeat studies in 1 year.  2. Peripheral arterial disease: The patient has known left external iliac artery stenosis as well as right popliteal artery disease.  Ino significant claudication at this time.  3. Hyperlipidemia: Continue atorvastatin 40 mg daily.   4.  Coronary artery disease involving native coronary arteries without angina: Continue medical therapy.  5.  Right carotid bruit: This seems to be new.  I requested carotid Doppler.   Disposition:   FU with me in 1 year  Signed,  Lorine Bears, MD  06/28/2023 9:43 AM    [facility]

## 2023-07-04 ENCOUNTER — Encounter: Payer: Self-pay | Admitting: Physician Assistant

## 2023-07-04 ENCOUNTER — Ambulatory Visit (INDEPENDENT_AMBULATORY_CARE_PROVIDER_SITE_OTHER): Payer: Medicare Other | Admitting: Physician Assistant

## 2023-07-04 VITALS — BP 149/75 | HR 68 | Resp 18 | Ht 69.0 in | Wt 196.0 lb

## 2023-07-04 DIAGNOSIS — N1832 Chronic kidney disease, stage 3b: Secondary | ICD-10-CM | POA: Diagnosis not present

## 2023-07-04 DIAGNOSIS — G3184 Mild cognitive impairment, so stated: Secondary | ICD-10-CM | POA: Diagnosis not present

## 2023-07-04 NOTE — Progress Notes (Signed)
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  MMSE today is 26/30 .He is not on antidementia medication and prefers to not taken unless his Neuropsych were to yield worsening cognitive status.    Recommendations:   Follow up in 6  months. Repeat neuropsych evaluation for clarity of the diagnosis and disease trajectory Recommend using hearing aids to improve comprehension Recommend good control of cardiovascular risk factors Continue to control mood as per PCP    Subjective:   This patient is accompanied in the office by his wife  who supplements the history. Previous records as well as any outside records available were reviewed prior to todays visit.   Patient was last seen on July 2023.    Any changes in memory since last visit? "  I have some good and bad days, maybe a little worse at times".  Short-term memory is worse than his long term memory.  Likes doing crossword puzzles. Likes to watch TV, he does not participate on activities outside of the house   repeats oneself? "Not quite" Disoriented when walking into a room?  Patient denies Leaving objects in unusual places?  Patient denies   Wandering behavior?   denies   Any personality changes since last visit?   denies   Any worsening depression?: denies   Hallucinations or paranoia?  denies   Seizures?   denies    Any sleep changes? Sleeps well, "unless there is something on my mind or go to the bathroom (5 times a night) ".  Denies vivid dreams, REM behavior or sleepwalking   Sleep apnea? Endorsed, " I tried the CPAP and could not  sleep, so I don't use it".    Any hygiene concerns?   denies   Independent of bathing and dressing?  Endorsed  Does the patient needs help with medications? Patient is in charge, may forget doses, so wife has to help him   Who is in charge of the finances?  Wife is in charge     Any changes in appetite?  denies    Patient have trouble swallowing?  denies   Does the patient cook?  Occasionally  Any kitchen accidents such as leaving the stove on?   denies   Any headaches?    denies   Vision changes? denies Chronic pain?  denies   Ambulates with difficulty?  Uses a cane and a walker, "but I don't need to use them" Recent falls or head injuries?    denies      Unilateral weakness, numbness or tingling?   denies   Any tremors?  denies   Any anosmia?    denies   Any incontinence of urine?  denies   Any bowel dysfunction?  denies      Patient lives with wife  Does the patient drive?  He has to rehearse to where he has to go otherwise he may have some difficulties.      Initial evaluation, Dr. Everlena Newman 2022 Since at least early 2021, his wife has noticed memory problems.  He will sometimes not be aware of things.  For example,  if his wife leaves a letter on the counter to be mailed out, he may not place it in the mailbox.  If his wife tells him there is food in the refrigerator, he forgets it is there.  He sometimes forgets the day of the week but he attributes that to being retired and not needing to keep track of the day.  Sometimes he forgets how to get to certain doctor's offices.  However, he says it is because he has many doctors and has to stop and think about where they are located.  He doesn't have trouble driving to his PCP.  He doesn't typically become disoriented driving on familiar routes.  His parents didn't have memory deficits but hey both passed away at age 50.   Also, for about a year, his wife has noticed unsteady gait.  He seems to wobble from side to side when he walks.  He had a  fall in January, in which he tripped on the steps.  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  He also has diabetes that has worsened over the past year, requiring medication.  Last Hgb A1c was 7.    Neurocognitive testing 06/07/2022 Dr. Milbert Newman Briefly, results suggested performance variability across domains of processing speed, executive functioning, visuospatial abilities, and both encoding (i.e., learning) and retrieval aspects of memory. No cognitive domains exhibited consistent impairment. Relative to his previous evaluation in March 2022, performances exhibited a very large degree of stability. The only task which exhibited a measurable decline was a visuoconstructional figure drawing task. However, poor performance on this task was due to poor attention to detail rather than visuospatial impairment as he omitted several aspects entirely. Very slight decline could also be argued across a list learning memory task. However, other memory tasks exhibited evidence for mild improvement if not full stability. All other domains exhibited complete stability. 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 Numerous vascular and complex medical conditions would be expected to produce variability and/or weakness across testing in the exact areas exhibited by [name]. General stability over time would also be expected. As such, this represents the most likely etiology for ongoing dysfunction. It is also worth highlighting that research has shown that individuals with Agent Orange exposure may be at an increased risk for cognitive decline. The influence of this in his current clinical presentation is plausible but unknown.    MRI of the brain December 2023, personally reviewed was remarkable for chronic small vessel ischemia and some volume loss  without acute findings.  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   Myocardial infarction    Neck pain 03/05/2015   Obesity    OSA (obstructive sleep apnea) 08/09/2013    October 2014:AHI of 14 , RDI 33/h and desaturation to 88%    Osteoarthritis of left knee 04/24/2021   Osteoarthritis of right knee 04/24/2021   Pain in joint of left shoulder 01/14/2021   Peripheral arterial disease    Pneumonia    Rotator cuff tear arthropathy 05/14/2021   Skin cancer    Thrombocytopenia    Type 2 diabetes mellitus with peripheral angiopathy    Urinary incontinence    Venous insufficiency (chronic) (peripheral)    Vitamin D deficiency    Wheezing      Past Surgical History:  Procedure Laterality Date   bladder cancer surgery     CARDIAC CATHETERIZATION     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   albuterol (PROVENTIL) (2.5 MG/3ML) 0.083% nebulizer solution Take 2.5 mg by nebulization every 6 (six) hours as needed for wheezing or shortness of breath.   ASPIRIN LOW DOSE 81 MG tablet TAKE 1 TABLET DAILY   atorvastatin (LIPITOR) 40 MG tablet TAKE 1 TABLET BY MOUTH DAILY.   Boswellia-Glucosamine-Vit D (OSTEO BI-FLEX ONE PER DAY PO) Take 2 tablets by mouth daily.   Cholecalciferol (VITAMIN D-3) 125 MCG (5000 UT) TABS Take 5,000 Units by mouth daily.   Coenzyme Q10 (CO Q-10 PO) Take 10 mLs by mouth daily.   mirabegron ER (MYRBETRIQ) 25 MG TB24 tablet Take 25 mg by mouth daily.   Multiple Vitamins-Minerals (AIRBORNE) PACK Take by mouth.   nadolol (CORGARD) 40 MG tablet Take 1 tablet (40 mg total) by mouth daily.   Tiotropium Bromide-Olodaterol (STIOLTO RESPIMAT) 2.5-2.5 MCG/ACT AERS Inhale 2 puffs into the lungs daily.   TURMERIC PO Take 1,350 mg by mouth daily.   umeclidinium-vilanterol (ANORO ELLIPTA) 62.5-25 MCG/ACT AEPB Inhale 1 puff into the lungs daily.   vitamin B-12 (CYANOCOBALAMIN) 1000 MCG tablet Take 1,000 mcg by mouth daily.   nitroGLYCERIN (NITROSTAT) 0.4 MG SL tablet Place 1 tablet (0.4 mg total) under the tongue every 5 (five) minutes as needed for chest pain. (Patient not taking: Reported on 07/04/2023)   No facility-administered encounter medications on file as of 07/04/2023.     Objective:     PHYSICAL EXAMINATION:    VITALS:   Vitals:   07/04/23 1115  BP: (!) 149/75  Pulse: 68  Resp: 18  SpO2: 95%  Weight: 196 lb (88.9 kg)  Height: 5\' 9"  (1.753 m)    GEN:  The patient appears stated age and is in NAD. HEENT:  Normocephalic, atraumatic.   Neurological examination:  General: NAD, well-groomed, appears stated age. Orientation: The patient is alert. Oriented to  person, place and date Cranial nerves: There is good facial symmetry.The speech is fluent and clear. No aphasia or dysarthria. Fund of knowledge is appropriate. Recent and remote memory impaired. Attention  and concentration are normal.  Able to name objects and repeat phrases.  Hearing is decreased to conversational tone, uses hearing aids.   Delayed recall 2/3 Sensation: Sensation is intact to light touch throughout Motor: Strength is at least antigravity x4. DTR's 2/4 in UE/LE      03/04/2021    3:00 PM  Montreal Cognitive Assessment   Visuospatial/ Executive (0/5) 2  Naming (0/3) 3  Attention: Read list of digits (0/2) 2  Attention: Read list of letters (0/1) 1  Attention: Serial 7 subtraction starting at 100 (0/3) 3  Language: Repeat phrase (0/2) 0  Language : Fluency (0/1) 0  Abstraction (0/2) 2  Delayed Recall (0/5) 0  Orientation (0/6) 4  Total 17  Adjusted Score (based on education) 18       07/04/2023    6:00 PM  MMSE - Mini Mental State Exam  Orientation to time 3  Orientation to Place 5  Registration 3  Attention/ Calculation 5  Recall 2  Language- name 2 objects 2  Language- repeat 1  Language- follow 3 step command 3  Language- read & follow direction 1  Write a sentence 1  Copy design 0  Total score 26       Movement examination: Tone: There is normal tone in the UE/LE Abnormal movements:  no tremor.  No myoclonus.  No asterixis.   Coordination:  There is no decremation with RAM's. Normal finger to nose  Gait and Station: The patient has no difficulty arising out of a deep-seated chair without the use of the hands. The patient's stride length is good.  Gait is cautious and narrow.   Thank you for allowing [place] the opportunity to participate in the care of this nice patient. Please do not hesitate to contact us for any questions or concerns.   Total time spent on today's visit was 33 minutes dedicated to this patient today, preparing to see patient,  examining the patient, ordering tests and/or medications and counseling the patient, documenting clinical information in the EHR or other health record, independently interpreting results and communicating results to the patient/family, discussing treatment and goals, answering patient's questions and coordinating care.  Cc:  Lorenda Ishihara, MD  Marlowe Kays 07/04/2023 6:56 PM

## 2023-07-04 NOTE — Patient Instructions (Signed)
Follow up in 1 year Recommend to control cardiovascular risk factors Repeat Neurocognitive testing  Make sure to follow the neck mass with ENT  Recommend Hearing Aids

## 2023-07-05 ENCOUNTER — Ambulatory Visit (HOSPITAL_COMMUNITY)
Admission: RE | Admit: 2023-07-05 | Discharge: 2023-07-05 | Disposition: A | Discharge status: Home / Self Care | Payer: Medicare Other | Source: Ambulatory Visit | Attending: Cardiology | Admitting: Cardiology

## 2023-07-05 DIAGNOSIS — R0989 Other specified symptoms and signs involving the circulatory and respiratory systems: Secondary | ICD-10-CM | POA: Insufficient documentation

## 2023-07-07 ENCOUNTER — Other Ambulatory Visit (HOSPITAL_COMMUNITY): Payer: Self-pay | Admitting: *Deleted

## 2023-07-07 DIAGNOSIS — I6523 Occlusion and stenosis of bilateral carotid arteries: Secondary | ICD-10-CM

## 2023-07-07 DIAGNOSIS — E785 Hyperlipidemia, unspecified: Secondary | ICD-10-CM

## 2023-07-07 MED ORDER — ROSUVASTATIN CALCIUM 40 MG PO TABS: 40.0000 mg | ORAL_TABLET | Freq: Every day | ORAL | 3 refills | Status: DC

## 2023-08-10 DIAGNOSIS — L570 Actinic keratosis: Secondary | ICD-10-CM | POA: Diagnosis not present

## 2023-08-10 DIAGNOSIS — L821 Other seborrheic keratosis: Secondary | ICD-10-CM | POA: Diagnosis not present

## 2023-08-10 DIAGNOSIS — D692 Other nonthrombocytopenic purpura: Secondary | ICD-10-CM | POA: Diagnosis not present

## 2023-08-10 DIAGNOSIS — Z85828 Personal history of other malignant neoplasm of skin: Secondary | ICD-10-CM | POA: Diagnosis not present

## 2023-08-29 ENCOUNTER — Other Ambulatory Visit: Payer: Self-pay | Admitting: Interventional Cardiology

## 2023-09-07 ENCOUNTER — Encounter: Payer: Self-pay | Admitting: Podiatry

## 2023-09-07 ENCOUNTER — Ambulatory Visit (INDEPENDENT_AMBULATORY_CARE_PROVIDER_SITE_OTHER): Payer: Medicare Other | Admitting: Podiatry

## 2023-09-07 DIAGNOSIS — M79675 Pain in left toe(s): Secondary | ICD-10-CM

## 2023-09-07 DIAGNOSIS — B351 Tinea unguium: Secondary | ICD-10-CM | POA: Diagnosis not present

## 2023-09-07 DIAGNOSIS — E1151 Type 2 diabetes mellitus with diabetic peripheral angiopathy without gangrene: Secondary | ICD-10-CM

## 2023-09-07 DIAGNOSIS — M79674 Pain in right toe(s): Secondary | ICD-10-CM

## 2023-09-07 DIAGNOSIS — E1169 Type 2 diabetes mellitus with other specified complication: Secondary | ICD-10-CM

## 2023-09-07 NOTE — Progress Notes (Signed)
This patient returns to my office for at risk foot care.  This patient requires this care by a professional since this patient will be at risk due to having type 2 diabetes, CKD and swelling right foot.  This patient is unable to cut nails himself since the patient cannot reach his nails.These nails are painful walking and wearing shoes.  This patient presents for at risk foot care today.  General Appearance  Alert, conversant and in no acute stress.  Vascular  Dorsalis pedis and posterior tibial  pulses are  weakly palpable  bilaterally.  Capillary return is within normal limits  bilaterally. Temperature is within normal limits  bilaterally.  Neurologic  Senn-Weinstein monofilament wire test within normal limits  bilaterally. Muscle power within normal limits bilaterally.  Nails Thick disfigured discolored nails with subungual debris  from hallux to fifth toes bilaterally. No evidence of bacterial infection or drainage bilaterally. Bleeding from hallux nail lateral border from self inflicted.  Orthopedic  No limitations of motion  feet .  No crepitus or effusions noted.  No bony pathology or digital deformities noted.  DJD 1st MPJ right foot.  Skin  normotropic skin with no porokeratosis noted bilaterally.  No signs of infections or ulcers noted.     Onychomycosis  Pain in right toes  Pain in left toes  Consent was obtained for treatment procedures.   Mechanical debridement of nails 1-5  bilaterally performed with a nail nipper.  Filed with dremel without incident.   Return office visit    3 months                  Told patient to return for periodic foot care and evaluation due to potential at risk complications.   Helane Gunther DPM

## 2023-09-15 DIAGNOSIS — E1151 Type 2 diabetes mellitus with diabetic peripheral angiopathy without gangrene: Secondary | ICD-10-CM | POA: Diagnosis not present

## 2023-09-15 DIAGNOSIS — E669 Obesity, unspecified: Secondary | ICD-10-CM | POA: Diagnosis not present

## 2023-09-15 DIAGNOSIS — I2510 Atherosclerotic heart disease of native coronary artery without angina pectoris: Secondary | ICD-10-CM | POA: Diagnosis not present

## 2023-09-28 DIAGNOSIS — Z23 Encounter for immunization: Secondary | ICD-10-CM | POA: Diagnosis not present

## 2023-10-18 ENCOUNTER — Telehealth (INDEPENDENT_AMBULATORY_CARE_PROVIDER_SITE_OTHER): Payer: Self-pay | Admitting: Otolaryngology

## 2023-10-18 NOTE — Telephone Encounter (Signed)
Confirmed with patient and spouse of appointment on 11/13 at 10:20 AM

## 2023-10-19 ENCOUNTER — Ambulatory Visit (INDEPENDENT_AMBULATORY_CARE_PROVIDER_SITE_OTHER): Payer: Medicare Other | Admitting: Audiology

## 2023-10-19 ENCOUNTER — Encounter (INDEPENDENT_AMBULATORY_CARE_PROVIDER_SITE_OTHER): Payer: Self-pay

## 2023-10-19 ENCOUNTER — Ambulatory Visit (INDEPENDENT_AMBULATORY_CARE_PROVIDER_SITE_OTHER): Payer: Medicare Other | Admitting: Otolaryngology

## 2023-10-19 VITALS — Ht 68.0 in | Wt 207.0 lb

## 2023-10-19 DIAGNOSIS — H9313 Tinnitus, bilateral: Secondary | ICD-10-CM

## 2023-10-19 DIAGNOSIS — H903 Sensorineural hearing loss, bilateral: Secondary | ICD-10-CM

## 2023-10-19 NOTE — Progress Notes (Signed)
Patient ID: Steven Newman, male   DOB: 01-12-1941, 82 y.o.   MRN: [id number]  Follow-up: Bilateral hearing loss, tinnitus  HPI: The patient is an 82 year old male who returns today for his yearly follow-up.  The patient was last seen 1 year ago.  At that time, he was complaining of bilateral hearing loss and tinnitus.  He was noted to have bilateral high-frequency sensorineural hearing loss, worse on the left side.  His tinnitus was likely a direct result of his hearing loss.  His subsequent MRI scan was negative for retrocochlear lesion.  The patient uses bilateral hearing aids.  He returns today reporting no significant change in his hearing.  He currently denies any otalgia, otorrhea, or vertigo.  Exam: General: Communicates without difficulty, well nourished, no acute distress. Head: Normocephalic, no evidence injury, no tenderness, facial buttresses intact without stepoff. Face/sinus: No tenderness to palpation and percussion. Facial movement is normal and symmetric. Eyes: PERRL, EOMI. No scleral icterus, conjunctivae clear. Neuro: CN II exam reveals vision grossly intact.  No nystagmus at any point of gaze. Ears: Auricles well formed without lesions.  Ear canals are intact without mass or lesion.  No erythema or edema is appreciated.  The TMs are intact without fluid. Nose: External evaluation reveals normal support and skin without lesions.  Dorsum is intact.  Anterior rhinoscopy reveals pink mucosa over anterior aspect of inferior turbinates and intact septum.  No purulence noted. Oral:  Oral cavity and oropharynx are intact, symmetric, without erythema or edema.  Mucosa is moist without lesions. Neck: Full range of motion without pain.  There is no significant lymphadenopathy.  No masses palpable.  Thyroid bed within normal limits to palpation.  Parotid glands and submandibular glands equal bilaterally without mass.  Trachea is midline. Neuro:  CN 2-12 grossly intact.   AUDIOMETRIC TESTING: I have  read and reviewed the audiometric test, which shows bilateral high-frequency sensorineural hearing loss, worse on the left side.   Assessment: 1.  Stable bilateral high-frequency sensorineural hearing loss, with asymmetry on the left side. 2.  The patient's ear canals, tympanic membranes, and middle ear spaces are normal.  3.  His tinnitus is likely a direct result of his hearing loss. 4.  His MRI scan was negative for retrocochlear lesion.  Plan  1.  The physical exam findings and the hearing test results are reviewed with the patient.  2.  The strategies to cope with tinnitus, including the use of masker, hearing aids, tinnitus retraining therapy, and avoidance of caffeine and alcohol are discussed.  3.  Continue the use of his hearing aids. 4.  The patient will return for reevaluation in 1 year

## 2023-10-19 NOTE — Progress Notes (Signed)
  [address], [address] [place], [place] [address] [phone]  Audiological Evaluation    Name: Steven Newman     DOB:   12-07-1940      MRN:   [id number]                                                                                     Service Date: 10/19/2023        Patient was referred today for a hearing evaluation by Dr. Karle Barr.   Symptoms Yes Details  Hearing loss  [x]  Patient reported perceiving hearing loss where the left ear is worse than the right ear.  Tinnitus  [x]  Patient reported experiencing left ear tinnitus.  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  Previous ear surgeries  []  Patient denied any previous ear surgeries.  Amplification  [x]  Patient reported the use of hearing aids bilaterally.    Tympanogram: Right ear: Normal external ear canal volume with normal middle ear pressure and high tympanic membrane compliance (Type Ad). Left ear: Normal external ear canal volume with normal middle ear pressure and high tympanic membrane compliance (Type Ad).    Hearing Evaluation: The audiogram was completed using conventional audiometric techniques under headphones with good reliability.   The hearing test results indicate: Right ear: Normal hearing sensitivity from 250-500 Hz sloping to severe sensorineural hearing loss from 1000-8000 Hz. Left ear: Mild sensorineural hearing loss from 250-500 Hz sloping to severe sensorineural hearing loss from 1000-8000 Hz. Asymmetry noted, where the left ear is worse than the right ear from [phone] Hz.  Speech Audiometry: Right ear- Speech Reception Threshold (SRT) was obtained at 35 dBHL. Left ear- Speech Reception Threshold (SRT) was obtained at 45 dBHL masked.   Word Recognition Score Tested using NU-6 (MLV) Right ear: 72% was obtained at a presentation level of 75 dBHL which is deemed as fair understanding. Left ear: 72% was obtained at a presentation level  of 85 dBHL with contralateral masking which is deemed as fair understanding.    Impression:  There is not a significant difference between word recognition scores between ears.   Recommendations: Repeat audiogram when changes are perceived or per MD. Continue use of hearing aids. Consider various tinnitus strategies, including the use of a noise generator, hearing aids, or tinnitus retraining therapy.   Conley Rolls Annel Zunker, AUD, CCC-A 10/19/23

## 2023-10-21 DIAGNOSIS — H43813 Vitreous degeneration, bilateral: Secondary | ICD-10-CM | POA: Diagnosis not present

## 2023-10-21 DIAGNOSIS — H0288B Meibomian gland dysfunction left eye, upper and lower eyelids: Secondary | ICD-10-CM | POA: Diagnosis not present

## 2023-10-21 DIAGNOSIS — H0288A Meibomian gland dysfunction right eye, upper and lower eyelids: Secondary | ICD-10-CM | POA: Diagnosis not present

## 2023-10-21 DIAGNOSIS — E119 Type 2 diabetes mellitus without complications: Secondary | ICD-10-CM | POA: Diagnosis not present

## 2023-10-28 ENCOUNTER — Encounter: Payer: Self-pay | Admitting: Audiology

## 2023-11-17 ENCOUNTER — Ambulatory Visit (INDEPENDENT_AMBULATORY_CARE_PROVIDER_SITE_OTHER): Payer: Medicare Other | Admitting: Otolaryngology

## 2023-11-21 DIAGNOSIS — J449 Chronic obstructive pulmonary disease, unspecified: Secondary | ICD-10-CM | POA: Diagnosis not present

## 2023-11-21 DIAGNOSIS — E7800 Pure hypercholesterolemia, unspecified: Secondary | ICD-10-CM | POA: Diagnosis not present

## 2023-11-21 DIAGNOSIS — Z0000 Encounter for general adult medical examination without abnormal findings: Secondary | ICD-10-CM | POA: Diagnosis not present

## 2023-11-21 DIAGNOSIS — I25118 Atherosclerotic heart disease of native coronary artery with other forms of angina pectoris: Secondary | ICD-10-CM | POA: Diagnosis not present

## 2023-11-21 DIAGNOSIS — E1169 Type 2 diabetes mellitus with other specified complication: Secondary | ICD-10-CM | POA: Diagnosis not present

## 2023-11-21 DIAGNOSIS — I2510 Atherosclerotic heart disease of native coronary artery without angina pectoris: Secondary | ICD-10-CM | POA: Diagnosis not present

## 2023-11-21 DIAGNOSIS — I10 Essential (primary) hypertension: Secondary | ICD-10-CM | POA: Diagnosis not present

## 2023-11-21 DIAGNOSIS — N1832 Chronic kidney disease, stage 3b: Secondary | ICD-10-CM | POA: Diagnosis not present

## 2023-12-19 ENCOUNTER — Encounter (INDEPENDENT_AMBULATORY_CARE_PROVIDER_SITE_OTHER): Payer: Self-pay

## 2023-12-19 ENCOUNTER — Encounter (INDEPENDENT_AMBULATORY_CARE_PROVIDER_SITE_OTHER): Payer: Self-pay | Admitting: Otolaryngology

## 2023-12-19 ENCOUNTER — Telehealth: Payer: Self-pay | Admitting: Otolaryngology

## 2023-12-19 ENCOUNTER — Encounter (INDEPENDENT_AMBULATORY_CARE_PROVIDER_SITE_OTHER): Payer: Medicare Other | Admitting: Otolaryngology

## 2023-12-19 DIAGNOSIS — M25512 Pain in left shoulder: Secondary | ICD-10-CM | POA: Diagnosis not present

## 2023-12-19 DIAGNOSIS — M25511 Pain in right shoulder: Secondary | ICD-10-CM | POA: Diagnosis not present

## 2023-12-19 NOTE — Telephone Encounter (Signed)
 provider was not here at the pt sch apt, provider was still in sx , pt will call when they get home to r/s so they can look at the calendar first.

## 2023-12-22 ENCOUNTER — Encounter: Payer: Self-pay | Admitting: Podiatry

## 2023-12-22 ENCOUNTER — Ambulatory Visit: Payer: Medicare Other | Admitting: Podiatry

## 2023-12-22 DIAGNOSIS — E1151 Type 2 diabetes mellitus with diabetic peripheral angiopathy without gangrene: Secondary | ICD-10-CM

## 2023-12-22 DIAGNOSIS — B351 Tinea unguium: Secondary | ICD-10-CM

## 2023-12-22 DIAGNOSIS — E1169 Type 2 diabetes mellitus with other specified complication: Secondary | ICD-10-CM | POA: Diagnosis not present

## 2023-12-22 NOTE — Progress Notes (Signed)
This patient returns to my office for at risk foot care.  This patient requires this care by a professional since this patient will be at risk due to having type 2 diabetes, CKD and swelling right foot.  This patient is unable to cut nails himself since the patient cannot reach his nails.These nails are painful walking and wearing shoes.  This patient presents for at risk foot care today.  General Appearance  Alert, conversant and in no acute stress.  Vascular  Dorsalis pedis and posterior tibial  pulses are  weakly palpable  bilaterally.  Capillary return is within normal limits  bilaterally. Temperature is within normal limits  bilaterally.  Neurologic  Senn-Weinstein monofilament wire test within normal limits  bilaterally. Muscle power within normal limits bilaterally.  Nails Thick disfigured discolored nails with subungual debris  from hallux to fifth toes bilaterally. No evidence of bacterial infection or drainage bilaterally. Bleeding from hallux nail lateral border from self inflicted.  Orthopedic  No limitations of motion  feet .  No crepitus or effusions noted.  No bony pathology or digital deformities noted.  DJD 1st MPJ right foot.  Skin  normotropic skin with no porokeratosis noted bilaterally.  No signs of infections or ulcers noted.     Onychomycosis  Pain in right toes  Pain in left toes  Consent was obtained for treatment procedures.   Mechanical debridement of nails 1-5  bilaterally performed with a nail nipper.  Filed with dremel without incident.   Return office visit    3 months                  Told patient to return for periodic foot care and evaluation due to potential at risk complications.   Helane Gunther DPM

## 2023-12-23 DIAGNOSIS — M25511 Pain in right shoulder: Secondary | ICD-10-CM | POA: Diagnosis not present

## 2023-12-28 DIAGNOSIS — I129 Hypertensive chronic kidney disease with stage 1 through stage 4 chronic kidney disease, or unspecified chronic kidney disease: Secondary | ICD-10-CM | POA: Diagnosis not present

## 2023-12-28 DIAGNOSIS — E1122 Type 2 diabetes mellitus with diabetic chronic kidney disease: Secondary | ICD-10-CM | POA: Diagnosis not present

## 2023-12-28 DIAGNOSIS — R296 Repeated falls: Secondary | ICD-10-CM | POA: Diagnosis not present

## 2023-12-28 DIAGNOSIS — N1832 Chronic kidney disease, stage 3b: Secondary | ICD-10-CM | POA: Diagnosis not present

## 2023-12-28 DIAGNOSIS — E875 Hyperkalemia: Secondary | ICD-10-CM | POA: Diagnosis not present

## 2023-12-28 DIAGNOSIS — E785 Hyperlipidemia, unspecified: Secondary | ICD-10-CM | POA: Diagnosis not present

## 2024-01-13 NOTE — Progress Notes (Signed)
 Error

## 2024-01-25 ENCOUNTER — Other Ambulatory Visit (HOSPITAL_COMMUNITY): Payer: Self-pay | Admitting: Cardiovascular Disease

## 2024-01-25 DIAGNOSIS — I714 Abdominal aortic aneurysm, without rupture, unspecified: Secondary | ICD-10-CM

## 2024-01-30 DIAGNOSIS — L309 Dermatitis, unspecified: Secondary | ICD-10-CM | POA: Diagnosis not present

## 2024-01-30 DIAGNOSIS — Z85828 Personal history of other malignant neoplasm of skin: Secondary | ICD-10-CM | POA: Diagnosis not present

## 2024-01-31 DIAGNOSIS — M19011 Primary osteoarthritis, right shoulder: Secondary | ICD-10-CM | POA: Diagnosis not present

## 2024-01-31 DIAGNOSIS — Z4789 Encounter for other orthopedic aftercare: Secondary | ICD-10-CM | POA: Diagnosis not present

## 2024-01-31 DIAGNOSIS — M19012 Primary osteoarthritis, left shoulder: Secondary | ICD-10-CM | POA: Diagnosis not present

## 2024-02-01 DIAGNOSIS — Z85828 Personal history of other malignant neoplasm of skin: Secondary | ICD-10-CM | POA: Diagnosis not present

## 2024-02-01 DIAGNOSIS — L309 Dermatitis, unspecified: Secondary | ICD-10-CM | POA: Diagnosis not present

## 2024-02-04 ENCOUNTER — Encounter (HOSPITAL_BASED_OUTPATIENT_CLINIC_OR_DEPARTMENT_OTHER): Payer: Self-pay | Admitting: Emergency Medicine

## 2024-02-04 ENCOUNTER — Telehealth: Payer: Self-pay | Admitting: Cardiology

## 2024-02-04 ENCOUNTER — Inpatient Hospital Stay (HOSPITAL_BASED_OUTPATIENT_CLINIC_OR_DEPARTMENT_OTHER)
Admission: EM | Admit: 2024-02-04 | Discharge: 2024-02-09 | DRG: 291 | Disposition: A | Discharge status: Home Health | Attending: Internal Medicine | Admitting: Internal Medicine

## 2024-02-04 ENCOUNTER — Emergency Department (HOSPITAL_BASED_OUTPATIENT_CLINIC_OR_DEPARTMENT_OTHER)

## 2024-02-04 DIAGNOSIS — M50223 Other cervical disc displacement at C6-C7 level: Secondary | ICD-10-CM | POA: Diagnosis not present

## 2024-02-04 DIAGNOSIS — I739 Peripheral vascular disease, unspecified: Secondary | ICD-10-CM | POA: Diagnosis present

## 2024-02-04 DIAGNOSIS — J439 Emphysema, unspecified: Secondary | ICD-10-CM | POA: Diagnosis present

## 2024-02-04 DIAGNOSIS — Z8709 Personal history of other diseases of the respiratory system: Secondary | ICD-10-CM | POA: Diagnosis not present

## 2024-02-04 DIAGNOSIS — I2510 Atherosclerotic heart disease of native coronary artery without angina pectoris: Secondary | ICD-10-CM | POA: Diagnosis present

## 2024-02-04 DIAGNOSIS — E785 Hyperlipidemia, unspecified: Secondary | ICD-10-CM | POA: Diagnosis not present

## 2024-02-04 DIAGNOSIS — Z8679 Personal history of other diseases of the circulatory system: Secondary | ICD-10-CM | POA: Diagnosis not present

## 2024-02-04 DIAGNOSIS — I10 Essential (primary) hypertension: Secondary | ICD-10-CM | POA: Diagnosis not present

## 2024-02-04 DIAGNOSIS — K551 Chronic vascular disorders of intestine: Secondary | ICD-10-CM | POA: Diagnosis present

## 2024-02-04 DIAGNOSIS — E872 Acidosis, unspecified: Secondary | ICD-10-CM | POA: Diagnosis not present

## 2024-02-04 DIAGNOSIS — E1151 Type 2 diabetes mellitus with diabetic peripheral angiopathy without gangrene: Secondary | ICD-10-CM | POA: Diagnosis present

## 2024-02-04 DIAGNOSIS — E1165 Type 2 diabetes mellitus with hyperglycemia: Secondary | ICD-10-CM | POA: Diagnosis present

## 2024-02-04 DIAGNOSIS — I723 Aneurysm of iliac artery: Secondary | ICD-10-CM | POA: Diagnosis present

## 2024-02-04 DIAGNOSIS — M1712 Unilateral primary osteoarthritis, left knee: Secondary | ICD-10-CM

## 2024-02-04 DIAGNOSIS — I5043 Acute on chronic combined systolic (congestive) and diastolic (congestive) heart failure: Secondary | ICD-10-CM | POA: Diagnosis present

## 2024-02-04 DIAGNOSIS — R102 Pelvic and perineal pain: Secondary | ICD-10-CM | POA: Diagnosis not present

## 2024-02-04 DIAGNOSIS — E1169 Type 2 diabetes mellitus with other specified complication: Secondary | ICD-10-CM | POA: Diagnosis present

## 2024-02-04 DIAGNOSIS — E559 Vitamin D deficiency, unspecified: Secondary | ICD-10-CM | POA: Diagnosis present

## 2024-02-04 DIAGNOSIS — I700 Atherosclerosis of aorta: Secondary | ICD-10-CM | POA: Diagnosis not present

## 2024-02-04 DIAGNOSIS — E1122 Type 2 diabetes mellitus with diabetic chronic kidney disease: Secondary | ICD-10-CM | POA: Diagnosis present

## 2024-02-04 DIAGNOSIS — R7401 Elevation of levels of liver transaminase levels: Secondary | ICD-10-CM | POA: Diagnosis present

## 2024-02-04 DIAGNOSIS — N179 Acute kidney failure, unspecified: Secondary | ICD-10-CM | POA: Diagnosis not present

## 2024-02-04 DIAGNOSIS — Z8249 Family history of ischemic heart disease and other diseases of the circulatory system: Secondary | ICD-10-CM

## 2024-02-04 DIAGNOSIS — I5031 Acute diastolic (congestive) heart failure: Secondary | ICD-10-CM | POA: Diagnosis not present

## 2024-02-04 DIAGNOSIS — Z8551 Personal history of malignant neoplasm of bladder: Secondary | ICD-10-CM

## 2024-02-04 DIAGNOSIS — Z85828 Personal history of other malignant neoplasm of skin: Secondary | ICD-10-CM

## 2024-02-04 DIAGNOSIS — M419 Scoliosis, unspecified: Secondary | ICD-10-CM | POA: Diagnosis not present

## 2024-02-04 DIAGNOSIS — R531 Weakness: Secondary | ICD-10-CM | POA: Diagnosis not present

## 2024-02-04 DIAGNOSIS — E7800 Pure hypercholesterolemia, unspecified: Secondary | ICD-10-CM | POA: Diagnosis present

## 2024-02-04 DIAGNOSIS — M25562 Pain in left knee: Secondary | ICD-10-CM | POA: Diagnosis not present

## 2024-02-04 DIAGNOSIS — Z7951 Long term (current) use of inhaled steroids: Secondary | ICD-10-CM

## 2024-02-04 DIAGNOSIS — N1831 Chronic kidney disease, stage 3a: Secondary | ICD-10-CM | POA: Diagnosis not present

## 2024-02-04 DIAGNOSIS — Z7984 Long term (current) use of oral hypoglycemic drugs: Secondary | ICD-10-CM

## 2024-02-04 DIAGNOSIS — I110 Hypertensive heart disease with heart failure: Secondary | ICD-10-CM | POA: Diagnosis not present

## 2024-02-04 DIAGNOSIS — R0682 Tachypnea, not elsewhere classified: Secondary | ICD-10-CM | POA: Diagnosis not present

## 2024-02-04 DIAGNOSIS — I509 Heart failure, unspecified: Secondary | ICD-10-CM

## 2024-02-04 DIAGNOSIS — R6883 Chills (without fever): Secondary | ICD-10-CM | POA: Diagnosis not present

## 2024-02-04 DIAGNOSIS — Z880 Allergy status to penicillin: Secondary | ICD-10-CM

## 2024-02-04 DIAGNOSIS — M47812 Spondylosis without myelopathy or radiculopathy, cervical region: Secondary | ICD-10-CM | POA: Diagnosis present

## 2024-02-04 DIAGNOSIS — M25462 Effusion, left knee: Secondary | ICD-10-CM | POA: Diagnosis not present

## 2024-02-04 DIAGNOSIS — I872 Venous insufficiency (chronic) (peripheral): Secondary | ICD-10-CM | POA: Diagnosis present

## 2024-02-04 DIAGNOSIS — Z9181 History of falling: Secondary | ICD-10-CM

## 2024-02-04 DIAGNOSIS — I130 Hypertensive heart and chronic kidney disease with heart failure and stage 1 through stage 4 chronic kidney disease, or unspecified chronic kidney disease: Principal | ICD-10-CM | POA: Diagnosis present

## 2024-02-04 DIAGNOSIS — E860 Dehydration: Secondary | ICD-10-CM | POA: Diagnosis present

## 2024-02-04 DIAGNOSIS — I70203 Unspecified atherosclerosis of native arteries of extremities, bilateral legs: Secondary | ICD-10-CM | POA: Diagnosis present

## 2024-02-04 DIAGNOSIS — M47816 Spondylosis without myelopathy or radiculopathy, lumbar region: Secondary | ICD-10-CM | POA: Diagnosis present

## 2024-02-04 DIAGNOSIS — Z87891 Personal history of nicotine dependence: Secondary | ICD-10-CM

## 2024-02-04 DIAGNOSIS — K5730 Diverticulosis of large intestine without perforation or abscess without bleeding: Secondary | ICD-10-CM | POA: Diagnosis present

## 2024-02-04 DIAGNOSIS — Z96612 Presence of left artificial shoulder joint: Secondary | ICD-10-CM | POA: Diagnosis present

## 2024-02-04 DIAGNOSIS — Z7982 Long term (current) use of aspirin: Secondary | ICD-10-CM

## 2024-02-04 DIAGNOSIS — I7143 Infrarenal abdominal aortic aneurysm, without rupture: Secondary | ICD-10-CM | POA: Diagnosis present

## 2024-02-04 DIAGNOSIS — H903 Sensorineural hearing loss, bilateral: Secondary | ICD-10-CM | POA: Diagnosis present

## 2024-02-04 DIAGNOSIS — I440 Atrioventricular block, first degree: Secondary | ICD-10-CM | POA: Diagnosis not present

## 2024-02-04 DIAGNOSIS — I5023 Acute on chronic systolic (congestive) heart failure: Secondary | ICD-10-CM | POA: Diagnosis not present

## 2024-02-04 DIAGNOSIS — Z888 Allergy status to other drugs, medicaments and biological substances status: Secondary | ICD-10-CM

## 2024-02-04 DIAGNOSIS — Z79899 Other long term (current) drug therapy: Secondary | ICD-10-CM

## 2024-02-04 DIAGNOSIS — Z860101 Personal history of adenomatous and serrated colon polyps: Secondary | ICD-10-CM

## 2024-02-04 DIAGNOSIS — E119 Type 2 diabetes mellitus without complications: Secondary | ICD-10-CM

## 2024-02-04 DIAGNOSIS — Z9861 Coronary angioplasty status: Secondary | ICD-10-CM

## 2024-02-04 DIAGNOSIS — N400 Enlarged prostate without lower urinary tract symptoms: Secondary | ICD-10-CM | POA: Diagnosis present

## 2024-02-04 DIAGNOSIS — M25561 Pain in right knee: Secondary | ICD-10-CM | POA: Diagnosis not present

## 2024-02-04 DIAGNOSIS — I5021 Acute systolic (congestive) heart failure: Secondary | ICD-10-CM | POA: Diagnosis not present

## 2024-02-04 DIAGNOSIS — M50222 Other cervical disc displacement at C5-C6 level: Secondary | ICD-10-CM | POA: Diagnosis not present

## 2024-02-04 DIAGNOSIS — I252 Old myocardial infarction: Secondary | ICD-10-CM

## 2024-02-04 DIAGNOSIS — N401 Enlarged prostate with lower urinary tract symptoms: Secondary | ICD-10-CM | POA: Diagnosis not present

## 2024-02-04 DIAGNOSIS — M51369 Other intervertebral disc degeneration, lumbar region without mention of lumbar back pain or lower extremity pain: Secondary | ICD-10-CM | POA: Diagnosis not present

## 2024-02-04 DIAGNOSIS — G8929 Other chronic pain: Secondary | ICD-10-CM | POA: Diagnosis present

## 2024-02-04 DIAGNOSIS — M47814 Spondylosis without myelopathy or radiculopathy, thoracic region: Secondary | ICD-10-CM | POA: Diagnosis not present

## 2024-02-04 DIAGNOSIS — R262 Difficulty in walking, not elsewhere classified: Secondary | ICD-10-CM | POA: Diagnosis not present

## 2024-02-04 DIAGNOSIS — I7090 Unspecified atherosclerosis: Secondary | ICD-10-CM | POA: Diagnosis not present

## 2024-02-04 DIAGNOSIS — M170 Bilateral primary osteoarthritis of knee: Secondary | ICD-10-CM | POA: Diagnosis present

## 2024-02-04 DIAGNOSIS — I2583 Coronary atherosclerosis due to lipid rich plaque: Secondary | ICD-10-CM | POA: Diagnosis not present

## 2024-02-04 DIAGNOSIS — I701 Atherosclerosis of renal artery: Secondary | ICD-10-CM | POA: Diagnosis present

## 2024-02-04 DIAGNOSIS — M160 Bilateral primary osteoarthritis of hip: Secondary | ICD-10-CM | POA: Diagnosis present

## 2024-02-04 DIAGNOSIS — M25461 Effusion, right knee: Secondary | ICD-10-CM | POA: Diagnosis present

## 2024-02-04 DIAGNOSIS — N1832 Chronic kidney disease, stage 3b: Secondary | ICD-10-CM | POA: Diagnosis present

## 2024-02-04 DIAGNOSIS — I6523 Occlusion and stenosis of bilateral carotid arteries: Secondary | ICD-10-CM | POA: Diagnosis present

## 2024-02-04 DIAGNOSIS — Z87442 Personal history of urinary calculi: Secondary | ICD-10-CM

## 2024-02-04 DIAGNOSIS — Z833 Family history of diabetes mellitus: Secondary | ICD-10-CM

## 2024-02-04 DIAGNOSIS — M4802 Spinal stenosis, cervical region: Secondary | ICD-10-CM | POA: Diagnosis not present

## 2024-02-04 DIAGNOSIS — M5127 Other intervertebral disc displacement, lumbosacral region: Secondary | ICD-10-CM | POA: Diagnosis not present

## 2024-02-04 DIAGNOSIS — E782 Mixed hyperlipidemia: Secondary | ICD-10-CM | POA: Diagnosis not present

## 2024-02-04 DIAGNOSIS — R296 Repeated falls: Secondary | ICD-10-CM | POA: Diagnosis present

## 2024-02-04 DIAGNOSIS — E7849 Other hyperlipidemia: Secondary | ICD-10-CM | POA: Diagnosis not present

## 2024-02-04 DIAGNOSIS — M48061 Spinal stenosis, lumbar region without neurogenic claudication: Secondary | ICD-10-CM | POA: Diagnosis present

## 2024-02-04 DIAGNOSIS — I4510 Unspecified right bundle-branch block: Secondary | ICD-10-CM | POA: Diagnosis present

## 2024-02-04 DIAGNOSIS — N280 Ischemia and infarction of kidney: Secondary | ICD-10-CM | POA: Diagnosis not present

## 2024-02-04 DIAGNOSIS — R21 Rash and other nonspecific skin eruption: Secondary | ICD-10-CM | POA: Diagnosis present

## 2024-02-04 LAB — CBC WITH DIFFERENTIAL/PLATELET
Abs Immature Granulocytes: 0.06 10*3/uL (ref 0.00–0.07)
Basophils Absolute: 0 10*3/uL (ref 0.0–0.1)
Basophils Relative: 0 %
Eosinophils Absolute: 0.1 10*3/uL (ref 0.0–0.5)
Eosinophils Relative: 1 %
HCT: 46.2 % (ref 39.0–52.0)
Hemoglobin: 14.9 g/dL (ref 13.0–17.0)
Immature Granulocytes: 1 %
Lymphocytes Relative: 11 %
Lymphs Abs: 0.9 10*3/uL (ref 0.7–4.0)
MCH: 30.2 pg (ref 26.0–34.0)
MCHC: 32.3 g/dL (ref 30.0–36.0)
MCV: 93.7 fL (ref 80.0–100.0)
Monocytes Absolute: 1.1 10*3/uL — ABNORMAL HIGH (ref 0.1–1.0)
Monocytes Relative: 13 %
Neutro Abs: 6.1 10*3/uL (ref 1.7–7.7)
Neutrophils Relative %: 74 %
Platelets: 152 10*3/uL (ref 150–400)
RBC: 4.93 MIL/uL (ref 4.22–5.81)
RDW: 15.6 % — ABNORMAL HIGH (ref 11.5–15.5)
WBC: 8.4 10*3/uL (ref 4.0–10.5)
nRBC: 0 % (ref 0.0–0.2)

## 2024-02-04 LAB — COMPREHENSIVE METABOLIC PANEL
ALT: 46 U/L — ABNORMAL HIGH (ref 0–44)
AST: 35 U/L (ref 15–41)
Albumin: 3.4 g/dL — ABNORMAL LOW (ref 3.5–5.0)
Alkaline Phosphatase: 56 U/L (ref 38–126)
Anion gap: 8 (ref 5–15)
BUN: 37 mg/dL — ABNORMAL HIGH (ref 8–23)
CO2: 22 mmol/L (ref 22–32)
Calcium: 8.9 mg/dL (ref 8.9–10.3)
Chloride: 105 mmol/L (ref 98–111)
Creatinine, Ser: 1.66 mg/dL — ABNORMAL HIGH (ref 0.61–1.24)
GFR, Estimated: 40 mL/min — ABNORMAL LOW (ref >60)
Glucose, Bld: 154 mg/dL — ABNORMAL HIGH (ref 70–99)
Potassium: 4.8 mmol/L (ref 3.5–5.1)
Sodium: 135 mmol/L (ref 135–145)
Total Bilirubin: 0.6 mg/dL (ref 0.0–1.2)
Total Protein: 7.8 g/dL (ref 6.5–8.1)

## 2024-02-04 LAB — URINALYSIS, MICROSCOPIC (REFLEX)

## 2024-02-04 LAB — TROPONIN I (HIGH SENSITIVITY)
Troponin I (High Sensitivity): 15 ng/L (ref <18)
Troponin I (High Sensitivity): 16 ng/L (ref <18)

## 2024-02-04 LAB — RESP PANEL BY RT-PCR (RSV, FLU A&B, COVID)  RVPGX2
Influenza A by PCR: NEGATIVE
Influenza B by PCR: NEGATIVE
Resp Syncytial Virus by PCR: NEGATIVE
SARS Coronavirus 2 by RT PCR: NEGATIVE

## 2024-02-04 LAB — URINALYSIS, ROUTINE W REFLEX MICROSCOPIC
Bilirubin Urine: NEGATIVE
Glucose, UA: >=500 mg/dL — AB
Hgb urine dipstick: NEGATIVE
Ketones, ur: NEGATIVE mg/dL
Leukocytes,Ua: NEGATIVE
Nitrite: NEGATIVE
Protein, ur: 100 mg/dL — AB
Specific Gravity, Urine: 1.015 (ref 1.005–1.030)
pH: 5.5 (ref 5.0–8.0)

## 2024-02-04 LAB — BRAIN NATRIURETIC PEPTIDE: B Natriuretic Peptide: 896.6 pg/mL — ABNORMAL HIGH (ref 0.0–100.0)

## 2024-02-04 MED ORDER — FUROSEMIDE 10 MG/ML IJ SOLN
20.0000 mg | Freq: Once | INTRAMUSCULAR | Status: AC
  Administered 2024-02-04: 20 mg via INTRAVENOUS
  Filled 2024-02-04: qty 2

## 2024-02-04 MED ORDER — IPRATROPIUM-ALBUTEROL 0.5-2.5 (3) MG/3ML IN SOLN
3.0000 mL | Freq: Once | RESPIRATORY_TRACT | Status: AC
  Administered 2024-02-04: 3 mL via RESPIRATORY_TRACT
  Filled 2024-02-04: qty 3

## 2024-02-04 MED ORDER — SODIUM CHLORIDE 0.9 % IV BOLUS: 1000.0000 mL | Freq: Once | INTRAVENOUS | Status: DC

## 2024-02-04 MED ORDER — IOHEXOL 350 MG/ML SOLN
80.0000 mL | Freq: Once | INTRAVENOUS | Status: AC | PRN
  Administered 2024-02-04: 80 mL via INTRAVENOUS

## 2024-02-04 NOTE — Telephone Encounter (Signed)
 Received a call from the patients wife concerned about him not being able to bend his legs like he had been able to previously. She also noted he started with a rash. He was evaluated by a dermatologist that started him on antibiotic therapy.

## 2024-02-04 NOTE — Telephone Encounter (Signed)
 Patient's wife, Bronson Ing, called the answering service this afternoon concerned that her husband could not move his legs. He has also been very weak, and he cannot stand up out of a chair on his own. This is very unusual for him as he normally does not require any assistance. He also has bilateral knee pain and a rash. No fever, chills, body aches.   Patient's wife is very concerned about his symptoms and is worried that his leg stiffness is related to his abdominal aortic aneurysm. She is also worried that his rash may be a staph infection. Overall, she wants him to be seen today for evaluation. Discussed that the only way to get him seen today is an emergency department. She has been getting him ready to go to the ED, and wanted to let us know that she had decided to bring him in. I will forward this message to Dr. Kirke Corin (patient's primary cardiologist) as an Olegario Shearer, PA-C 02/04/2024 1:41 PM

## 2024-02-04 NOTE — ED Notes (Signed)
 Attempted to call wife 3xs to update about transfer to [facility]. Wife did not answer either of the phone numbers. This RN left message.

## 2024-02-04 NOTE — ED Provider Notes (Signed)
 [facility] Provider Note  CSN: [id number] Arrival date & time: 02/04/24 1359  Chief Complaint(s) Weakness  HPI Steven Newman is a 83 y.o. male with past medical history as below, significant for abdominal aortic aneurysm, COPD, CKD, neurocognitive disorder, type II DM who presents to the ED with complaint of leg weakness  Patient here with his spouse.  Reports progressive weakness over the last 2 or 3 days.  Feels he is weak diffusely but worse in his legs.  Difficulty ambulating at home, difficulty standing from seated position.  He is not having any pain in his knee over the past few days.  Has history of prior abdominal aortic aneurysm, they contacted cardiology office Dr. Kirke Corin because they are concerned for potential worsening or progression of the aneurysm/leg weakness.   He has no abdominal pain, no significant back pain, no numbness to extremities.  He feels his legs are weak bilateral equal.  No abdominal pain, chest pain, nausea or vomiting.  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 09/2015 Hgb 16.5   Dyspnea    Erectile dysfunction    Exposure to Agent Orange    History of adenomatous polyp of colon    History of artificial lens replacement    History of bladder cancer    History of skin cancer    History of total shoulder replacement, left 08/07/2021   Hypercholesterolemia    Hyperlipidemia     Microalbuminuria    Mild neurocognitive disorder 03/04/2021   Myocardial infarction    Neck pain 03/05/2015   Obesity    OSA (obstructive sleep apnea) 08/09/2013    October 2014:AHI of 14 , RDI 33/h and desaturation to 88%    Osteoarthritis of left knee 04/24/2021   Osteoarthritis of right knee 04/24/2021   Pain in joint of left shoulder 01/14/2021   Peripheral arterial disease    Pneumonia    Rotator cuff tear arthropathy 05/14/2021   Skin cancer    Thrombocytopenia    Type 2 diabetes mellitus with peripheral angiopathy    Urinary incontinence    Venous insufficiency (chronic) (peripheral)    Vitamin D deficiency    Wheezing    Patient Active Problem List   Diagnosis Date Noted   Tinnitus of both ears 10/19/2023   History of artificial lens replacement    Exposure to Agent Orange    History of adenomatous polyp of colon    Myocardial infarction    Microalbuminuria    Peripheral arterial disease    Thrombocytopenia    Urinary incontinence    Venous insufficiency (chronic) (peripheral)    Vitamin D deficiency    Hypercholesterolemia    History of total shoulder replacement, left 08/07/2021   Abnormal gait 05/30/2021   Benign essential hypertension 05/30/2021   Sensorineural hearing loss, bilateral 05/30/2021   Chronic kidney disease, stage 3b 05/30/2021   Rotator cuff tear arthropathy 05/14/2021   Bilateral hip bursitis 04/24/2021   Osteoarthritis of  left knee 04/24/2021   Osteoarthritis of right knee 04/24/2021   Mild neurocognitive disorder 03/04/2021   Pain in joint of left shoulder 01/14/2021   Neck pain 03/05/2015   Abdominal aortic aneurysm 11/21/2014   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 Authorizing Provider  acetaminophen (TYLENOL) 325 MG tablet Take 650 mg by mouth every 6 (six) hours as needed.    [provider]  albuterol (PROVENTIL) (2.5 MG/3ML) 0.083% nebulizer solution Take 2.5 mg by nebulization every 6 (six) hours as needed for wheezing or shortness of breath.    [provider]  ASPIRIN LOW DOSE 81 MG tablet TAKE 1 TABLET DAILY 08/30/23   Corky Crafts, MD  Boswellia-Glucosamine-Vit D (OSTEO BI-FLEX ONE PER DAY PO) Take 2 tablets by mouth daily.    [provider]  Cholecalciferol (VITAMIN D-3) 125 MCG (5000 UT) TABS Take 5,000 Units by mouth daily.    [provider]  Coenzyme Q10 (CO Q-10 PO) Take 10 mLs by mouth daily.    [provider]  mirabegron ER (MYRBETRIQ) 25 MG TB24 tablet Take 25 mg by mouth daily.    [provider]  Multiple Vitamins-Minerals (AIRBORNE) PACK Take by mouth.    [provider]  nadolol (CORGARD) 40 MG tablet Take 1 tablet (40 mg total) by mouth daily. 12/12/17   Robbie Lis M, PA-C  nitroGLYCERIN (NITROSTAT) 0.4 MG SL tablet Place 1 tablet (0.4 mg total) under the tongue every 5 (five) minutes as needed for chest pain. 02/01/22   Corky Crafts, MD  rosuvastatin (CRESTOR) 40 MG tablet Take 1 tablet (40 mg total) by mouth daily. 07/07/23 10/05/23  Iran Ouch, MD  Tiotropium Bromide-Olodaterol (STIOLTO RESPIMAT) 2.5-2.5 MCG/ACT AERS Inhale 2 puffs into the lungs daily. 11/06/21   Mannam, Colbert Coyer, MD  TURMERIC PO Take 1,350 mg by mouth daily.    [provider]  umeclidinium-vilanterol (ANORO ELLIPTA) 62.5-25 MCG/ACT AEPB Inhale 1 puff into the lungs daily. 11/03/21   Mannam, Colbert Coyer, MD  vitamin B-12 (CYANOCOBALAMIN) 1000 MCG tablet Take 1,000 mcg by mouth daily.    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     REVERSE SHOULDER ARTHROPLASTY Left 08/07/2021   Procedure: REVERSE SHOULDER ARTHROPLASTY;  Surgeon: Beverely Low, MD;  Location: [facility];  Service: Orthopedics;  Laterality: Left;  with ISB   Family History Family History  Problem Relation Age of Onset   Heart disease Mother    Rheumatic fever Father    Rheumatic fever Brother    Diabetes Sister    Cancer Sister        cancer on eyelid    Social History Social History   Tobacco Use   Smoking status: Former    Current packs/day: 0.00    Average packs/day: 2.0 packs/day for 50.0 years (100.0 ttl pk-yrs)    Types: Cigarettes    Start date: 06/19/1965    Quit date: 06/20/2015    Years since quitting: 8.6   Smokeless tobacco: Never  Vaping Use   Vaping status: Never Used  Substance Use Topics   Alcohol use: Yes    Comment: 1- 2 beer a week and 1 mix drink   Drug use: No   Allergies Ace inhibitors, Amoxicillin-pot clavulanate, Other, and Penicillins  Review of Systems A thorough review of systems was obtained and all systems are negative except as noted in the HPI and PMH.   Physical Exam Vital Signs  I have reviewed the triage vital signs BP 135/66 (BP Location: Left Arm)   Pulse 81   Temp 98.1 F (36.7 C)   Resp (!) 28   Ht 5' 9.5" (1.765 m)   Wt 93.9 kg   SpO2 96%   BMI 30.13 kg/m  Physical Exam Vitals and nursing note reviewed.  Constitutional:      General: He is not in acute distress.    Appearance: He is well-developed.  HENT:     Head: Normocephalic and atraumatic.     Right Ear: External ear normal.     Left Ear: External ear normal.     Mouth/Throat:     Mouth: Mucous membranes are moist.  Eyes:     General: No scleral  icterus. Cardiovascular:     Rate and Rhythm: Normal rate and regular rhythm.     Pulses: Normal pulses.     Heart sounds: Normal heart sounds.  Pulmonary:     Effort: Pulmonary effort is normal. No respiratory distress.     Breath sounds: Wheezing present.  Abdominal:     General: Abdomen is flat.     Palpations: Abdomen is soft.     Tenderness: There is no abdominal tenderness.  Musculoskeletal:     Cervical back: No rigidity.     Right lower leg: No edema.     Left lower leg: No edema.  Skin:    General: Skin is warm and dry.     Capillary Refill: Capillary refill takes less than 2 seconds.  Neurological:     Mental Status: He is alert and oriented to person, place, and time.     Cranial Nerves: Cranial nerves 2-12 are intact. No dysarthria.     Sensory: Sensation is intact.     Motor: Motor function is intact.     Coordination: Coordination is intact.     Comments: LE NVI Gait testing deferred secondary to patient safety. Strength 5/5 to BLUE/BLLE, equal and symmetric    Psychiatric:        Mood and Affect: Mood normal.        Behavior: Behavior normal.     ED Results and Treatments Labs (  all labs ordered are listed, but only abnormal results are displayed) Labs Reviewed  CBC WITH DIFFERENTIAL/PLATELET - Abnormal; Notable for the following components:      Result Value   RDW 15.6 (*)    Monocytes Absolute 1.1 (*)    All other components within normal limits  COMPREHENSIVE METABOLIC PANEL - Abnormal; Notable for the following components:   Glucose, Bld 154 (*)    BUN 37 (*)    Creatinine, Ser 1.66 (*)    Albumin 3.4 (*)    ALT 46 (*)    GFR, Estimated 40 (*)    All other components within normal limits  RESP PANEL BY RT-PCR (RSV, FLU A&B, COVID)  RVPGX2  URINALYSIS, ROUTINE W REFLEX MICROSCOPIC                                                                                                                          Radiology No results found.  Pertinent  labs & imaging results that were available during my care of the patient were reviewed by me and considered in my medical decision making (see MDM for details).  Medications Ordered in ED Medications  sodium chloride 0.9 % bolus 1,000 mL (has no administration in time range)  iohexol (OMNIPAQUE) 350 MG/ML injection 80 mL (has no administration in time range)  ipratropium-albuterol (DUONEB) 0.5-2.5 (3) MG/3ML nebulizer solution 3 mL (3 mLs Nebulization Given 02/04/24 1553)                                                                                                                                     Procedures Procedures  (including critical care time)  Medical Decision Making / ED Course    Medical Decision Making:    KAMRYN GAUTHIER is a 83 y.o. male with past medical history as below, significant for abdominal aortic aneurysm, COPD, CKD, neurocognitive disorder, type II DM who presents to the ED with complaint of leg weakness. The complaint involves an extensive differential diagnosis and also carries with it a high risk of complications and morbidity.  Serious etiology was considered. Ddx includes but is not limited to: URI, viral syndrome, metabolic derangement, dehydration, infection, abdominal aortic aneurysm, pneumonia, etc.  Complete initial physical exam performed, notably the patient was in no distress, wheezing noted.    Reviewed and confirmed nursing documentation for past medical history, family history, social history.  Vital  signs reviewed.    Clinical Course as of 02/04/24 1603  Sat Feb 04, 2024  1546 Creatinine(!): 1.66 Baseline around 1.3 [SG]    Clinical Course User Index [SG] Sloan Leiter, DO    Brief summary: 83 year old male history as above here with leg weakness over the past few days.  Neuroexam is nonfocal.  LE NVI.  Strength symmetric lower extremities. Labs reviewed, AKI noted CXR wnl CTA chest/abd/pelvis              Additional history  obtained: -Additional history obtained from family -External records from outside source obtained and reviewed including: Chart review including previous notes, labs, imaging, consultation notes including  Prior labs and imaging, home medications, primary care documentation   Lab Tests: -I ordered, reviewed, and interpreted labs.   The pertinent results include:   Labs Reviewed  CBC WITH DIFFERENTIAL/PLATELET - Abnormal; Notable for the following components:      Result Value   RDW 15.6 (*)    Monocytes Absolute 1.1 (*)    All other components within normal limits  COMPREHENSIVE METABOLIC PANEL - Abnormal; Notable for the following components:   Glucose, Bld 154 (*)    BUN 37 (*)    Creatinine, Ser 1.66 (*)    Albumin 3.4 (*)    ALT 46 (*)    GFR, Estimated 40 (*)    All other components within normal limits  RESP PANEL BY RT-PCR (RSV, FLU A&B, COVID)  RVPGX2  URINALYSIS, ROUTINE W REFLEX MICROSCOPIC    Notable for ***  EKG   EKG Interpretation Date/Time:    Ventricular Rate:    PR Interval:    QRS Duration:    QT Interval:    QTC Calculation:   R Axis:      Text Interpretation:           Imaging Studies ordered: I ordered imaging studies including *** I independently visualized the following imaging with scope of interpretation limited to determining acute life threatening conditions related to emergency care; findings noted above I independently visualized and interpreted imaging. I agree with the radiologist interpretation   Medicines ordered and prescription drug management: Meds ordered this encounter  Medications   ipratropium-albuterol (DUONEB) 0.5-2.5 (3) MG/3ML nebulizer solution 3 mL   sodium chloride 0.9 % bolus 1,000 mL   iohexol (OMNIPAQUE) 350 MG/ML injection 80 mL    -I have reviewed the patients home medicines and have made adjustments as needed   Consultations Obtained: I requested consultation with the ***,  and discussed lab and  imaging findings as well as pertinent plan - they recommend: ***   Cardiac Monitoring: The patient was maintained on a cardiac monitor.  I personally viewed and interpreted the cardiac monitored which showed an underlying rhythm of: *** Continuous pulse oximetry interpreted by myself, ***% on ***.    Social Determinants of Health:  Diagnosis or treatment significantly limited by social determinants of health: {wssoc:28071}   Reevaluation: After the interventions noted above, I reevaluated the patient and found that they have {resolved/improved/worsened:23923::"improved"}  Co morbidities that complicate the patient evaluation  Past Medical History:  Diagnosis Date   Abdominal aortic aneurysm 11/21/2014   Abnormal gait 05/30/2021   Atherosclerotic heart disease of native coronary artery without angina pectoris    coronary angioplasty 1995   Benign essential hypertension 05/30/2021   Bilateral hip bursitis 04/24/2021   Bilateral sensorineural hearing loss 05/30/2021   Calculus of kidney 05/30/2021   Chronic kidney disease, stage 3b  05/30/2021   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   Myocardial infarction    Neck pain 03/05/2015   Obesity    OSA (obstructive sleep apnea) 08/09/2013    October 2014:AHI of 14 , RDI 33/h and desaturation to 88%    Osteoarthritis of left knee 04/24/2021   Osteoarthritis of right knee 04/24/2021   Pain in joint of left shoulder 01/14/2021   Peripheral arterial disease    Pneumonia     Rotator cuff tear arthropathy 05/14/2021   Skin cancer    Thrombocytopenia    Type 2 diabetes mellitus with peripheral angiopathy    Urinary incontinence    Venous insufficiency (chronic) (peripheral)    Vitamin D deficiency    Wheezing       Dispostion: Disposition decision including need for hospitalization was considered, and patient {wsdispo:28070::"discharged from emergency department."}    Final Clinical Impression(s) / ED Diagnoses Final diagnoses:  None

## 2024-02-04 NOTE — ED Notes (Signed)
 Attempted to call 3E 3 times to alert them that pt was on way with [facility] with zero answer each time

## 2024-02-04 NOTE — Plan of Care (Signed)
 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 Rash. Worsening DOE, LE swelling. Treating for mild HF exacerbation, BNP 896, CXR no edema. got lasix 20 mg IV. However also reported decreased PO. UA with granular casts. Mild AKI Cr 1.6-> 1.3. D/t concern re: aneurysm per wife got CTA C/A/P, aneurysm stable. 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 may benefit from seeing vascular. Will need PT as well.    Plan of care: The patient is accepted for admission to Med-surg  unit, at [facility].  Author: Dolly Rias, MD  02/04/2024  Check [email or web address] for on-call coverage.  Nursing staff, Please call [facility] Admits & Consults System-Wide number on Amion as soon as patient's arrival, so appropriate admitting provider can evaluate the pt.

## 2024-02-04 NOTE — ED Notes (Signed)
 [facility] called for transport.

## 2024-02-04 NOTE — Telephone Encounter (Signed)
 She stated that he was unable to move his legs as before and was acting like his legs were stiff and with his prior abdominal aortic aneurysm he was advised that he would lose circulation to his legs when he needed further evaluation.  He has an upcoming appointment for an additional scan to reevaluate his aneurysm.  His last scan last year was stable at 3.4 cm.  She was advised to continue to monitor his blood pressure which was stable this morning at 140's over 80s.  If she noted a continued decline in his condition as there was the mention they were concerned there may have been a strep infection he may need to come into the hospital for further evaluation.  At this time he had no symptoms other than the inability to move his legs over the last several days as he had before.  There were no medication changes that were made and no further testing in that was ordered.  Questions were answered and his wife was thankful for return call.

## 2024-02-04 NOTE — ED Triage Notes (Signed)
 Pt sts he doesn't have the strength to get up and walk; reports sxs started 2d ago; he had to have help with using restroom today; tachypnea noted; reports chills

## 2024-02-05 ENCOUNTER — Encounter (HOSPITAL_COMMUNITY): Payer: Self-pay | Admitting: Internal Medicine

## 2024-02-05 ENCOUNTER — Observation Stay (HOSPITAL_BASED_OUTPATIENT_CLINIC_OR_DEPARTMENT_OTHER)

## 2024-02-05 ENCOUNTER — Observation Stay (HOSPITAL_COMMUNITY)

## 2024-02-05 ENCOUNTER — Other Ambulatory Visit: Payer: Self-pay

## 2024-02-05 DIAGNOSIS — E1169 Type 2 diabetes mellitus with other specified complication: Secondary | ICD-10-CM

## 2024-02-05 DIAGNOSIS — N400 Enlarged prostate without lower urinary tract symptoms: Secondary | ICD-10-CM

## 2024-02-05 DIAGNOSIS — I10 Essential (primary) hypertension: Secondary | ICD-10-CM | POA: Diagnosis not present

## 2024-02-05 DIAGNOSIS — I5021 Acute systolic (congestive) heart failure: Secondary | ICD-10-CM | POA: Diagnosis not present

## 2024-02-05 DIAGNOSIS — N1831 Chronic kidney disease, stage 3a: Secondary | ICD-10-CM | POA: Diagnosis present

## 2024-02-05 DIAGNOSIS — R531 Weakness: Secondary | ICD-10-CM | POA: Diagnosis not present

## 2024-02-05 DIAGNOSIS — I5043 Acute on chronic combined systolic (congestive) and diastolic (congestive) heart failure: Secondary | ICD-10-CM | POA: Diagnosis not present

## 2024-02-05 DIAGNOSIS — I5031 Acute diastolic (congestive) heart failure: Secondary | ICD-10-CM | POA: Diagnosis not present

## 2024-02-05 DIAGNOSIS — M25462 Effusion, left knee: Secondary | ICD-10-CM | POA: Diagnosis not present

## 2024-02-05 DIAGNOSIS — E119 Type 2 diabetes mellitus without complications: Secondary | ICD-10-CM

## 2024-02-05 DIAGNOSIS — M25561 Pain in right knee: Secondary | ICD-10-CM | POA: Diagnosis not present

## 2024-02-05 DIAGNOSIS — Z8709 Personal history of other diseases of the respiratory system: Secondary | ICD-10-CM | POA: Diagnosis not present

## 2024-02-05 DIAGNOSIS — E785 Hyperlipidemia, unspecified: Secondary | ICD-10-CM | POA: Insufficient documentation

## 2024-02-05 DIAGNOSIS — R102 Pelvic and perineal pain: Secondary | ICD-10-CM | POA: Diagnosis not present

## 2024-02-05 DIAGNOSIS — Z8679 Personal history of other diseases of the circulatory system: Secondary | ICD-10-CM

## 2024-02-05 DIAGNOSIS — I739 Peripheral vascular disease, unspecified: Secondary | ICD-10-CM | POA: Diagnosis not present

## 2024-02-05 DIAGNOSIS — I7090 Unspecified atherosclerosis: Secondary | ICD-10-CM | POA: Diagnosis not present

## 2024-02-05 DIAGNOSIS — M25562 Pain in left knee: Secondary | ICD-10-CM | POA: Diagnosis not present

## 2024-02-05 DIAGNOSIS — E7849 Other hyperlipidemia: Secondary | ICD-10-CM | POA: Diagnosis not present

## 2024-02-05 HISTORY — DX: Benign prostatic hyperplasia without lower urinary tract symptoms: N40.0

## 2024-02-05 HISTORY — DX: Type 2 diabetes mellitus with other specified complication: E11.69

## 2024-02-05 LAB — GLUCOSE, CAPILLARY
Glucose-Capillary: 136 mg/dL — ABNORMAL HIGH (ref 70–99)
Glucose-Capillary: 149 mg/dL — ABNORMAL HIGH (ref 70–99)
Glucose-Capillary: 174 mg/dL — ABNORMAL HIGH (ref 70–99)
Glucose-Capillary: 179 mg/dL — ABNORMAL HIGH (ref 70–99)
Glucose-Capillary: 179 mg/dL — ABNORMAL HIGH (ref 70–99)

## 2024-02-05 LAB — CBC
HCT: 46.6 % (ref 39.0–52.0)
Hemoglobin: 14.8 g/dL (ref 13.0–17.0)
MCH: 30 pg (ref 26.0–34.0)
MCHC: 31.8 g/dL (ref 30.0–36.0)
MCV: 94.5 fL (ref 80.0–100.0)
Platelets: 138 10*3/uL — ABNORMAL LOW (ref 150–400)
RBC: 4.93 MIL/uL (ref 4.22–5.81)
RDW: 15.5 % (ref 11.5–15.5)
WBC: 9.6 10*3/uL (ref 4.0–10.5)
nRBC: 0 % (ref 0.0–0.2)

## 2024-02-05 LAB — COMPREHENSIVE METABOLIC PANEL
ALT: 36 U/L (ref 0–44)
AST: 21 U/L (ref 15–41)
Albumin: 2.8 g/dL — ABNORMAL LOW (ref 3.5–5.0)
Alkaline Phosphatase: 43 U/L (ref 38–126)
Anion gap: 18 — ABNORMAL HIGH (ref 5–15)
BUN: 32 mg/dL — ABNORMAL HIGH (ref 8–23)
CO2: 15 mmol/L — ABNORMAL LOW (ref 22–32)
Calcium: 9 mg/dL (ref 8.9–10.3)
Chloride: 105 mmol/L (ref 98–111)
Creatinine, Ser: 1.56 mg/dL — ABNORMAL HIGH (ref 0.61–1.24)
GFR, Estimated: 44 mL/min — ABNORMAL LOW (ref >60)
Glucose, Bld: 145 mg/dL — ABNORMAL HIGH (ref 70–99)
Potassium: 3.9 mmol/L (ref 3.5–5.1)
Sodium: 138 mmol/L (ref 135–145)
Total Bilirubin: 1.2 mg/dL (ref 0.0–1.2)
Total Protein: 6.9 g/dL (ref 6.5–8.1)

## 2024-02-05 LAB — ECHOCARDIOGRAM COMPLETE
AR max vel: 3.77 cm2
AV Peak grad: 3.8 mmHg
Ao pk vel: 0.97 m/s
Area-P 1/2: 3.65 cm2
Calc EF: 32 %
Height: 69.5 in
S' Lateral: 4 cm
Single Plane A2C EF: 31.6 %
Single Plane A4C EF: 33.1 %
Weight: 3156.99 [oz_av]

## 2024-02-05 LAB — LIPID PANEL
Cholesterol: 113 mg/dL (ref 0–200)
HDL: 51 mg/dL (ref >40)
LDL Cholesterol: 43 mg/dL (ref 0–99)
Total CHOL/HDL Ratio: 2.2 ratio
Triglycerides: 93 mg/dL (ref <150)
VLDL: 19 mg/dL (ref 0–40)

## 2024-02-05 LAB — HEMOGLOBIN A1C
Hgb A1c MFr Bld: 6.9 % — ABNORMAL HIGH (ref 4.8–5.6)
Mean Plasma Glucose: 151.33 mg/dL

## 2024-02-05 LAB — VAS US ABI WITH/WO TBI
Left ABI: 0.69
Right ABI: 0.69

## 2024-02-05 MED ORDER — ONDANSETRON HCL 4 MG PO TABS: 4.0000 mg | ORAL_TABLET | Freq: Four times a day (QID) | ORAL | Status: DC | PRN

## 2024-02-05 MED ORDER — ACETAMINOPHEN 650 MG RE SUPP: 650.0000 mg | Freq: Four times a day (QID) | RECTAL | Status: DC | PRN

## 2024-02-05 MED ORDER — SODIUM CHLORIDE 0.9% FLUSH
3.0000 mL | Freq: Two times a day (BID) | INTRAVENOUS | Status: DC
  Administered 2024-02-05 – 2024-02-08 (×8): 3 mL via INTRAVENOUS

## 2024-02-05 MED ORDER — CEPHALEXIN 250 MG PO CAPS
500.0000 mg | ORAL_CAPSULE | Freq: Three times a day (TID) | ORAL | Status: DC
  Administered 2024-02-05 – 2024-02-08 (×11): 500 mg via ORAL
  Filled 2024-02-05 (×12): qty 2

## 2024-02-05 MED ORDER — ACETAMINOPHEN 325 MG PO TABS
650.0000 mg | ORAL_TABLET | Freq: Four times a day (QID) | ORAL | Status: DC | PRN
  Administered 2024-02-05 (×2): 650 mg via ORAL
  Filled 2024-02-05 (×2): qty 2

## 2024-02-05 MED ORDER — TRIAMCINOLONE ACETONIDE 0.1 % EX CREA
1.0000 | TOPICAL_CREAM | Freq: Two times a day (BID) | CUTANEOUS | Status: DC
  Administered 2024-02-05 – 2024-02-08 (×8): 1 via TOPICAL
  Filled 2024-02-05 (×2): qty 15

## 2024-02-05 MED ORDER — ROSUVASTATIN CALCIUM 20 MG PO TABS
40.0000 mg | ORAL_TABLET | Freq: Every day | ORAL | Status: DC
Start: 2024-02-05 — End: 2024-02-09
  Administered 2024-02-05 – 2024-02-09 (×5): 40 mg via ORAL
  Filled 2024-02-05 (×5): qty 2

## 2024-02-05 MED ORDER — IPRATROPIUM-ALBUTEROL 0.5-2.5 (3) MG/3ML IN SOLN: 3.0000 mL | RESPIRATORY_TRACT | Status: DC | PRN

## 2024-02-05 MED ORDER — INSULIN ASPART 100 UNIT/ML IJ SOLN
0.0000 [IU] | Freq: Three times a day (TID) | INTRAMUSCULAR | Status: DC
  Administered 2024-02-05 – 2024-02-06 (×4): 2 [IU] via SUBCUTANEOUS
  Administered 2024-02-06: 1 [IU] via SUBCUTANEOUS
  Administered 2024-02-07: 2 [IU] via SUBCUTANEOUS
  Administered 2024-02-07: 5 [IU] via SUBCUTANEOUS
  Administered 2024-02-07: 3 [IU] via SUBCUTANEOUS
  Administered 2024-02-08: 2 [IU] via SUBCUTANEOUS
  Administered 2024-02-08: 5 [IU] via SUBCUTANEOUS
  Administered 2024-02-08 – 2024-02-09 (×2): 2 [IU] via SUBCUTANEOUS

## 2024-02-05 MED ORDER — ASPIRIN 81 MG PO TBEC
81.0000 mg | DELAYED_RELEASE_TABLET | Freq: Every day | ORAL | Status: DC
  Administered 2024-02-05 – 2024-02-09 (×5): 81 mg via ORAL
  Filled 2024-02-05 (×5): qty 1

## 2024-02-05 MED ORDER — HEPARIN SODIUM (PORCINE) 5000 UNIT/ML IJ SOLN
5000.0000 [IU] | Freq: Three times a day (TID) | INTRAMUSCULAR | Status: DC
  Administered 2024-02-05 – 2024-02-09 (×13): 5000 [IU] via SUBCUTANEOUS
  Filled 2024-02-05 (×12): qty 1

## 2024-02-05 MED ORDER — ONDANSETRON HCL 4 MG/2ML IJ SOLN: 4.0000 mg | Freq: Four times a day (QID) | INTRAMUSCULAR | Status: DC | PRN

## 2024-02-05 MED ORDER — SODIUM CHLORIDE 0.9% FLUSH: 3.0000 mL | INTRAVENOUS | Status: DC | PRN

## 2024-02-05 MED ORDER — SODIUM CHLORIDE 0.9 % IV SOLN: 250.0000 mL | INTRAVENOUS | Status: AC | PRN

## 2024-02-05 MED ORDER — MIRABEGRON ER 25 MG PO TB24
25.0000 mg | ORAL_TABLET | Freq: Every day | ORAL | Status: DC
  Administered 2024-02-05 – 2024-02-09 (×5): 25 mg via ORAL
  Filled 2024-02-05 (×5): qty 1

## 2024-02-05 MED ORDER — UMECLIDINIUM-VILANTEROL 62.5-25 MCG/ACT IN AEPB
1.0000 | INHALATION_SPRAY | Freq: Every day | RESPIRATORY_TRACT | Status: DC
  Administered 2024-02-05 – 2024-02-09 (×5): 1 via RESPIRATORY_TRACT
  Filled 2024-02-05: qty 14

## 2024-02-05 MED ORDER — FUROSEMIDE 10 MG/ML IJ SOLN
20.0000 mg | Freq: Two times a day (BID) | INTRAMUSCULAR | Status: DC
  Administered 2024-02-05: 20 mg via INTRAVENOUS
  Filled 2024-02-05: qty 2

## 2024-02-05 NOTE — Progress Notes (Addendum)
 [facility] PROGRESS NOTE   Steven Newman ZOX:[id number] DOB: Sep 09, 1941 DOA: 02/04/2024  PCP: Lorenda Ishihara, MD  Brief History: 83 y.o. 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  He also mentioned bilateral lower extremity pain left greater than right worse with ambulation.  Concern was for peripheral artery disease.  Patient was hospitalized for further management    Consultants: Vascular surgery has been consulted.  Procedures: Transthoracic echocardiogram is pending    Subjective/Interval History: Patient mentions that his main concern is the pain and weakness in his legs.  Denies any chest pain.  Shortness of breath is not as bad as yesterday.  No nausea or vomiting.    Assessment/Plan:  Bilateral lower extremity pain, concern for claudication CT angiogram raise concern for multiple vascular issues and the abdomen as well as in the lower extremities. 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  Near complete occlusion of the proximal left CFA and left profunda femoris.  Severe stenosis of the proximal left SFA was also noted.  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 These findings discussed with vascular surgery this morning.  They will evaluate the patient. No acute ischemia is noted on examination.  His feet are warm.  Pulses are poorly palpable.  He is able to lift his legs off the bed. Continue aspirin and statin.  LDL is  43. ADDENDUM: Patient evaluated by vascular. They don't believe that his leg weakness is due to vascular etiology despite the abnormal CTA. They think there could be an orthopedic etiology. Discussed with patient's wife to get additional history as history from patient was inadequate. Patient has had knee pain for a long time. No acute worsening in the pain per wife. No recent fall on the knee in the last week or so. Based on information from her it appears less likely that arthritis has cause such a sudden decrease in his ability to ambulate. Will get xrays of his knees and pelvis. If these are not conclusive then I believe we need to proceed with MRI spine to rule out a spinal issue.  Acute on chronic systolic and diastolic CHF Echocardiogram from October 2022 showed EF of 40 to 45% with grade 2 diastolic dysfunction. Does not have significant pedal edema.  No significant crackles on examination.  CT also did not show any pulmonary edema.  Do not think that he has significant CHF exacerbation currently.  We will hold his IV Lasix to protect his kidneys for possible angiogram that he will need for his PAD. His respiratory status is stable. Home medication list reviewed.  He is noted to be on nadolol prior to admission which is currently on hold due to soft blood pressures.  Should be able to resume if blood pressure stabilizes.  Not noted to be on any ACE inhibitor or ARB.  Not noted to be on any diuretics. Await repeat transthoracic echocardiogram.  History of coronary artery disease He has had MI and PCI previously.  Continue aspirin.  Apparently had not been taking his statin which  has been resumed.  Diabetes mellitus type 2 Home medication list does not show that he is on any diabetic medications.  SSI.  Monitor CBGs.  HbA1c 6.9.  History of COPD Stable.  Continue inhalers.  Skin rash versus skin cancer Apparently seen by dermatology recently and was prescribed cephalexin and triamcinolone  which will be continued.  Chronic kidney disease stage IIIa/metabolic acidosis Avoid nephrotoxic agents.  Metabolic acidosis is noted.  Recheck labs in the morning.  Mild infrarenal AAA Outpatient monitoring.  BPH No clinical symptoms currently.  Outpatient management.  Continue mirabegron.  Colonic diverticulosis No evidence for diverticulitis.  Avoid constipation.  History of bladder cancer Outpatient monitoring.  DVT Prophylaxis: Subcutaneous heparin Code Status: Full code Family Communication: Discussed with patient Disposition Plan: To be determined  Status is: Observation The patient will require care spanning > 2 midnights and should be moved to inpatient because: Needs vascular evaluation      Medications: Scheduled:  aspirin EC  81 mg Oral Daily   cephALEXin  500 mg Oral TID   heparin  5,000 Units Subcutaneous Q8H   insulin aspart  0-9 Units Subcutaneous TID WC   mirabegron ER  25 mg Oral Daily   rosuvastatin  40 mg Oral Daily   sodium chloride flush  3 mL Intravenous Q12H   triamcinolone cream  1 Application Topical BID   umeclidinium-vilanterol  1 puff Inhalation Daily   Continuous:  sodium chloride     WUJ:WJXBJY chloride, acetaminophen **OR** acetaminophen, ipratropium-albuterol, ondansetron **OR** ondansetron (ZOFRAN) IV, sodium chloride flush  Antibiotics: Anti-infectives (From admission, onward)    Start     Dose/Rate Route Frequency Ordered Stop   02/05/24 1030  cephALEXin (KEFLEX) capsule 500 mg        500 mg Oral 3 times daily 02/05/24 0936 02/10/24 0959       Objective:  Vital Signs  Vitals:   02/05/24 0335 02/05/24 0336 02/05/24 0337 02/05/24 0742  BP: 99/76   132/61  Pulse: 64   67  Resp: 18 16 16 18   Temp: (!) 97.5 F (36.4 C)   98.1 F (36.7 C)  TempSrc: Axillary   Oral  SpO2: 97%   97%  Weight:      Height:        Intake/Output Summary (Last 24 hours) at 02/05/2024 1013 Last data filed at 02/05/2024 0805 Gross per 24 hour   Intake 720 ml  Output 500 ml  Net 220 ml   Filed Weights   02/04/24 1420 02/05/24 0142  Weight: 93.9 kg 89.5 kg    General appearance: Awake alert.  In no distress Resp: Clear to auscultation bilaterally.  Normal effort Cardio: S1-S2 is normal regular.  No S3-S4.  No rubs murmurs or bruit GI: Abdomen is soft.  Nontender nondistended.  Bowel sounds are present normal.  No masses organomegaly Extremities: Able to lift both legs off the bed.  Strength is equal.  Poorly palpable DP and PT pulses.  Foot is warm and seems to be reasonably well-perfused on both sides. Neurologic: Alert and oriented x3.  No focal neurological deficits.    Lab Results:  Data Reviewed: I have personally reviewed following labs and reports of the imaging studies  CBC: Recent Labs  Lab 02/04/24 1423 02/05/24 0237  WBC 8.4 9.6  NEUTROABS 6.1  --   HGB 14.9 14.8  HCT 46.2 46.6  MCV 93.7 94.5  PLT 152 138*    Basic Metabolic Panel: Recent Labs  Lab 02/04/24 1423  02/05/24 0237  NA 135 138  K 4.8 3.9  CL 105 105  CO2 22 15*  GLUCOSE 154* 145*  BUN 37* 32*  CREATININE 1.66* 1.56*  CALCIUM 8.9 9.0    GFR: Estimated Creatinine Clearance: 39.3 mL/min (A) (by C-G formula based on SCr of 1.56 mg/dL (H)).  Liver Function Tests: Recent Labs  Lab 02/04/24 1423 02/05/24 0237  AST 35 21  ALT 46* 36  ALKPHOS 56 43  BILITOT 0.6 1.2  PROT 7.8 6.9  ALBUMIN 3.4* 2.8*     HbA1C: Recent Labs    02/05/24 0237  HGBA1C 6.9*    CBG: Recent Labs  Lab 02/05/24 0042 02/05/24 0532  GLUCAP 136* 149*    Lipid Profile: Recent Labs    02/05/24 0237  CHOL 113  HDL 51  LDLCALC 43  TRIG 93  CHOLHDL 2.2     Recent Results (from the past 240 hours)  Resp panel by RT-PCR (RSV, Flu A&B, Covid) Anterior Nasal Swab     Status: None   Collection Time: 02/04/24  2:23 PM   Specimen: Anterior Nasal Swab  Result Value Ref Range Status   SARS Coronavirus 2 by RT PCR NEGATIVE NEGATIVE Final     Comment: (NOTE) SARS-CoV-2 target nucleic acids are NOT DETECTED.  The SARS-CoV-2 RNA is generally detectable in upper respiratory specimens during the acute phase of infection. The lowest concentration of SARS-CoV-2 viral copies this assay can detect is 138 copies/mL. A negative result does not preclude SARS-Cov-2 infection and should not be used as the sole basis for treatment or other patient management decisions. A negative result may occur with  improper specimen collection/handling, submission of specimen other than nasopharyngeal swab, presence of viral mutation(s) within the areas targeted by this assay, and inadequate number of viral copies(<138 copies/mL). A negative result must be combined with clinical observations, patient history, and epidemiological information. The expected result is Negative.  Fact Sheet for Patients:  BloggerCourse.com  Fact Sheet for Healthcare Providers:  SeriousBroker.it  This test is no t yet approved or cleared by the [place] FDA and  has been authorized for detection and/or diagnosis of SARS-CoV-2 by FDA under an Emergency Use Authorization (EUA). This EUA will remain  in effect (meaning this test can be used) for the duration of the COVID-19 declaration under Section 564(b)(1) of the Act, 21 U.S.C.section 360bbb-3(b)(1), unless the authorization is terminated  or revoked sooner.       Influenza A by PCR NEGATIVE NEGATIVE Final   Influenza B by PCR NEGATIVE NEGATIVE Final    Comment: (NOTE) The Xpert Xpress SARS-CoV-2/FLU/RSV plus assay is intended as an aid in the diagnosis of influenza from Nasopharyngeal swab specimens and should not be used as a sole basis for treatment. Nasal washings and aspirates are unacceptable for Xpert Xpress SARS-CoV-2/FLU/RSV testing.  Fact Sheet for Patients: BloggerCourse.com  Fact Sheet for Healthcare  Providers: SeriousBroker.it  This test is not yet approved or cleared by the [place] FDA and has been authorized for detection and/or diagnosis of SARS-CoV-2 by FDA under an Emergency Use Authorization (EUA). This EUA will remain in effect (meaning this test can be used) for the duration of the COVID-19 declaration under Section 564(b)(1) of the Act, 21 U.S.C. section 360bbb-3(b)(1), unless the authorization is terminated or revoked.     Resp Syncytial Virus by PCR NEGATIVE NEGATIVE Final    Comment: (NOTE) Fact Sheet for Patients: BloggerCourse.com  Fact Sheet for Healthcare Providers: SeriousBroker.it  This test  is not yet approved or cleared by the [place] and has been authorized for detection and/or diagnosis of SARS-CoV-2 by FDA under an Emergency Use Authorization (EUA). This EUA will remain in effect (meaning this test can be used) for the duration of the COVID-19 declaration under Section 564(b)(1) of the Act, 21 U.S.C. section 360bbb-3(b)(1), unless the authorization is terminated or revoked.  Performed at [facility], [address] [place], [place] [address]       Radiology Studies: CT Angio Chest/Abd/Pel for Dissection W and/or Wo Contrast Result Date: 02/04/2024 CLINICAL DATA:  Acute aortic syndrome suspected. Weakness. Tachypnea. Chills. EXAM: CT ANGIOGRAPHY CHEST, ABDOMEN AND PELVIS TECHNIQUE: Non-contrast CT of the chest was initially obtained. Multidetector CT imaging through the chest, abdomen and pelvis was performed using the standard protocol during bolus administration of intravenous contrast. Multiplanar reconstructed images and MIPs were obtained and reviewed to evaluate the vascular anatomy. RADIATION DOSE REDUCTION: This exam was performed according to the departmental dose-optimization program which includes automated exposure control, adjustment of the  mA and/or kV according to patient size and/or use of iterative reconstruction technique. CONTRAST:  80mL OMNIPAQUE IOHEXOL 350 MG/ML SOLN COMPARISON:  Chest two-view 02/04/2024 FINDINGS: CTA CHEST FINDINGS Cardiovascular: Heart size within normal limits. Severe triple vessel coronary artery calcifications. No pulmonary artery embolism. No aortic dissection or intramural hematoma. Mediastinum/Nodes: No enlarged mediastinal, hilar, or axillary lymph nodes. Thyroid gland, trachea, and esophagus demonstrate no significant findings. Lungs/Pleura: No focal airspace opacity to indicate pneumonia. Scattered tiny calcified pulmonary nodules consistent with prior granulomatous inflammation. Musculoskeletal: Numerous old healed bilateral rib fractures are seen. No acute osseous abnormality. Review of the MIP images confirms the above findings. CTA ABDOMEN AND PELVIS FINDINGS VASCULAR Aorta: No aortic dissection. Calcified and noncalcified atheromatous plaque seen throughout the abdominal aorta with mild aneurysm of the infrarenal segment measuring up to 3.1 x 3.0 cm. Celiac: Patent without evidence of aneurysm, dissection, vasculitis or significant stenosis. 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 Moderate narrowing at the origin of the right main renal artery. IMA: Near complete occlusion at the origin of the IMA, which is otherwise patent. Right lower extremity arteries: Mild narrowing of the common iliac artery origin due to calcified atheromatous plaque. 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 Moderate to severe stenosis at the origin of the right internal iliac artery which is diffusely calcified but otherwise patent. Mild diffuse narrowing of the right external iliac artery due to  calcified atheromatous plaque. Irregular appearance of the right common femoral artery with a possible nonflow limiting focal dissection. Stranding anterior to the right common femoral artery likely related to prior surgical intervention. No high-grade stenosis of the visualized proximal profunda femoris are superficial femoral arteries. Left lower extremity arteries: Diffuse calcified and noncalcified atheromatous plaque of the left common iliac artery without flow-limiting stenosis. Proximal left internal iliac artery is occluded. Opacification of distal branches consistent with retrograde collateral flow. Mild diffuse narrowing of the external iliac artery due to diffusely calcified plaque. 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 Moderate stenosis of the distal left common femoral artery due to densely calcified plaque. Visualized segments of the left profunda femoris artery are occluded due to dense calcified plaque  at the origin. Severe stenosis of the proximal left SFA due to densely calcified plaque. Veins: No obvious venous abnormality within the limitations of this arterial phase study. Review of the MIP images confirms the above findings. NON-VASCULAR Hepatobiliary: Mild pneumobilia likely related to prior sphincterotomy. Please correlate with patient history. Liver, gallbladder, and bile ducts are otherwise normal. Pancreas: Diffuse fatty atrophy of the pancreas which is otherwise unremarkable. Spleen: Normal in size without focal abnormality. Adrenals/Urinary Tract: Adrenal glands are normal. Subcentimeter renal hypodensities are too small to fully characterize, however most likely represent simple cysts which do not require dedicated imaging follow-up. Multiple additional simple cysts are also seen bilaterally with the largest located in the left kidney measuring 4.2 cm. These simple cysts do not require dedicated imaging  surveillance. No hydronephrosis or hydroureter. Bladder is normal. Stomach/Bowel: No bowel dilatation to indicate ileus or obstruction. Extensive descending and sigmoid colon diverticulosis without evidence of acute diverticulitis. Appendix is normal. Lymphatic: No enlarged abdominopelvic lymph nodes. Reproductive: Moderate prostatomegaly. Other: No abdominal wall hernia or abnormality. No abdominopelvic ascites. Musculoskeletal: No acute or significant osseous findings. Review of the MIP images confirms the above findings. IMPRESSION: 1. No aortic dissection or intramural hematoma. 2. 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 Recommend follow-up ultrasound every 3 years. 3. 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 4. 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 Moderate narrowing at the origin of the right main renal artery. 5. 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 Visualized segments of the left profunda femoris artery are occluded due to dense calcified plaque at the origin. 6. Severe stenosis of the proximal left SFA due to densely calcified plaque. 7. 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 8. Extensive descending and sigmoid colon diverticulosis without evidence of acute diverticulitis. 9. Moderate prostatomegaly. Electronically Signed   By: Acquanetta Belling M.D.   On: 02/04/2024 18:07   DG Chest 2 View Result Date: 02/04/2024 CLINICAL DATA:  Weakness for 2 days.  Tachypnea and chills. EXAM: CHEST - 2 VIEW COMPARISON:  Radiographs 09/16/2021 and 07/29/2020.  CT 08/18/2015. FINDINGS: Lordotic positioning on the frontal examination. The heart size is stable at the upper limits of normal. There is mild aortic atherosclerosis. The lungs appear  clear. No evidence of pleural effusion or pneumothorax. Previous left shoulder reverse arthroplasty without evidence of acute osseous abnormality. IMPRESSION: Stable examination.  No evidence of active cardiopulmonary process. Electronically Signed   By: Carey Bullocks M.D.   On: 02/04/2024 16:03       LOS: 0 days   Steven Newman  [facility] Pager on [email or web address]  02/05/2024, 10:13 AM

## 2024-02-05 NOTE — Telephone Encounter (Signed)
 The symptoms are unlikely to be related to his peripheral arterial disease and aortic aneurysm.  It seems that he is hospitalized and getting workup for alternative etiologies.

## 2024-02-05 NOTE — Progress Notes (Signed)
 Attempted to call [facility] to get report on pt. RN busy, will call again.

## 2024-02-05 NOTE — Consult Note (Addendum)
 [facility] [place]  Patient name: Steven Newman MRN: [id number] DOB: Mar 26, 1941 Sex: male   REQUESTING PROVIDER:   Dr. Rito Ehrlich   REASON FOR CONSULT:    PAD  HISTORY OF PRESENT ILLNESS:   Steven Newman is a 83 y.o. male, who presented to the hospital on 02/05/2024 with weakness.  He states that he does not have the strength to get up and walk.  He is complaining of bilateral leg pain left worse than right.  This is in his knees and in the arch of his left foot.  He does not have any open wounds or classic rest pain.  He does not endorse symptoms of claudication.  He is a patient of Dr. Kirke Corin, who last saw him for PAD in July 2024.  At that time he had an ABI of 0.66 on the right and 0.84 on the left.  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  The patient suffers from coronary artery disease, status post MI.  He has congestive heart failure with an ejection fraction of 40-45%.  He is a type II diabetic.  He has CKD 3.  He is medically managed for hypertension.  He takes a statin for hypercholesterolemia.  He is a former smoker.  He does have exposure to agent orange.  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   Myocardial infarction    Neck pain 03/05/2015   Obesity    OSA (obstructive sleep apnea) 08/09/2013    October 2014:AHI of 14 , RDI 33/h and desaturation to 88%    Osteoarthritis of left knee 04/24/2021   Osteoarthritis of right knee 04/24/2021   Pain in joint of left shoulder 01/14/2021   Peripheral arterial disease    Pneumonia    Rotator cuff tear arthropathy 05/14/2021   Skin cancer    Thrombocytopenia    Type 2 diabetes mellitus with peripheral angiopathy    Urinary incontinence    Venous insufficiency (chronic) (peripheral)    Vitamin D deficiency    Wheezing      FAMILY HISTORY   Family History  Problem Relation Age of Onset   Heart disease Mother    Rheumatic fever Father    Rheumatic fever Brother    Diabetes Sister    Cancer Sister        cancer on eyelid    SOCIAL HISTORY:   Social History   Socioeconomic History   Marital  status: Married    Spouse name: Not on file   Number of children: Not on file   Years of education: Not on file   Highest education level: Not on file  Occupational History   Occupation: Retired  Tobacco Use   Smoking status: Former    Current packs/day: 0.00    Average packs/day: 2.0 packs/day for 50.0 years (100.0 ttl pk-yrs)    Types: Cigarettes    Start date: 06/19/1965    Quit date: 06/20/2015    Years since quitting: 8.6   Smokeless tobacco: Never  Vaping Use   Vaping status: Never Used  Substance and Sexual Activity   Alcohol use: Yes    Comment: 1- 2 beer a week and 1 mix drink   Drug use: No   Sexual activity: Not on file  Other Topics Concern   Not on file  Social History Narrative   Right handed   Drinks caffeine   Two story home   Social Drivers of Health   Financial  Resource Strain: Not on file  Food Insecurity: Unknown (02/05/2024)   Hunger Vital Sign    Worried About Running Out of Food in the Last Year: Never true    Ran Out of Food in the Last Year: Not on file  Transportation Needs: No Transportation Needs (02/05/2024)   PRAPARE - Administrator, Civil Service (Medical): No    Lack of Transportation (Non-Medical): No  Physical Activity: Not on file  Stress: Not on file  Social Connections: Moderately Isolated (02/05/2024)   Social Connection and Isolation Panel [NHANES]    Frequency of Communication with Friends and Family: Once a week    Frequency of Social Gatherings with Friends and Family: Once a week    Attends Religious Services: More than 4 times per year    Active Member of [employer] or Organizations: No    Attends Banker Meetings: Never    Marital Status: Married  Catering manager Violence: Not At Risk (02/05/2024)   Humiliation, Afraid, Rape, and Kick questionnaire    Fear of Current or Ex-Partner: No    Emotionally Abused: No    Physically Abused: No    Sexually Abused: No    ALLERGIES:    Allergies  Allergen Reactions   Ace Inhibitors Other (See Comments)    Angioedema  Other reaction(s): angioedema   Amoxicillin-Pot Clavulanate Other (See Comments)    Other reaction(s): hives   Other Other (See Comments)   Penicillins Swelling and Rash    Lips swell    CURRENT MEDICATIONS:    Current Facility-Administered Medications  Medication Dose Route Frequency Provider Last Rate Last Admin   0.9 %  sodium chloride infusion  250 mL Intravenous PRN Sundil, Subrina, MD       acetaminophen (TYLENOL) tablet 650 mg  650 mg Oral Q6H PRN Janalyn Shy, Subrina, MD   650 mg at 02/05/24 0132   Or   acetaminophen (TYLENOL) suppository 650 mg  650 mg Rectal Q6H PRN Janalyn Shy, Subrina, MD       aspirin EC tablet 81 mg  81 mg Oral Daily Sundil, Subrina, MD   81 mg at 02/05/24 0932   cephALEXin (KEFLEX) capsule 500 mg  500 mg Oral  TID Osvaldo Shipper, MD       heparin injection 5,000 Units  5,000 Units Subcutaneous Q8H Sundil, Subrina, MD   5,000 Units at 02/05/24 0527   insulin aspart (novoLOG) injection 0-9 Units  0-9 Units Subcutaneous TID  WC Osvaldo Shipper, MD       ipratropium-albuterol (DUONEB) 0.5-2.5 (3) MG/3ML nebulizer solution 3 mL  3 mL Nebulization Q4H PRN Janalyn Shy, Subrina, MD       mirabegron ER [facility]) tablet 25 mg  25 mg Oral Daily Sundil, Subrina, MD   25 mg at 02/05/24 0932   ondansetron (ZOFRAN) tablet 4 mg  4 mg Oral Q6H PRN Janalyn Shy, Subrina, MD       Or   ondansetron [facility]) injection 4 mg  4 mg Intravenous Q6H PRN Janalyn Shy, Subrina, MD       rosuvastatin (CRESTOR) tablet 40 mg  40 mg Oral Daily Sundil, Subrina, MD   40 mg at 02/05/24 0932   sodium chloride flush (NS) 0.9 % injection 3 mL  3 mL Intravenous Q12H Sundil, Subrina, MD   3 mL at 02/05/24 0747   sodium chloride flush (NS) 0.9 % injection 3 mL  3 mL Intravenous PRN Janalyn Shy, Subrina, MD       triamcinolone cream (KENALOG) 0.1 % cream 1 Application  1 Application Topical BID Osvaldo Shipper, MD       umeclidinium-vilanterol (ANORO ELLIPTA) 62.5-25 MCG/ACT 1 puff  1 puff Inhalation Daily Janalyn Shy, Subrina, MD   1 puff at 02/05/24 4540    REVIEW OF SYSTEMS:   [X]  denotes positive finding, [ ]  denotes negative finding Cardiac  Comments:  Chest pain or chest pressure:    Shortness of breath upon exertion:    Short of breath when lying flat:    Irregular heart rhythm:        Vascular    Pain in calf, thigh, or hip brought on by ambulation:    Pain in feet at night that wakes you up from your sleep:     Blood clot in your veins:    Leg swelling:         Pulmonary    Oxygen at home:    Productive cough:     Wheezing:         Neurologic    Sudden weakness in arms or legs:     Sudden numbness in arms or legs:     Sudden onset of difficulty speaking or slurred speech:    Temporary loss of vision in one eye:     Problems with dizziness:          Gastrointestinal    Blood in stool:      Vomited blood:         Genitourinary    Burning when urinating:     Blood in urine:        Psychiatric    Major depression:         Hematologic    Bleeding problems:    Problems with blood clotting too easily:        Skin    Rashes or ulcers:        Constitutional    Fever or chills:     PHYSICAL EXAM:   Vitals:   02/05/24 0336 02/05/24 0337 02/05/24 0742 02/05/24 0755  BP:   132/61   Pulse:   67   Resp: 16 16 18    Temp:   98.1 F (36.7 C)   TempSrc:   Oral   SpO2:   97% 94%  Weight:      Height:        GENERAL: The patient is a well-nourished male, in no acute distress. The vital signs are documented above. CARDIAC: There is a regular  rate and rhythm.  VASCULAR: Pedal pulses are nonpalpable.  He has palpable femoral pulses PULMONARY: Nonlabored respirations ABDOMEN: Soft and non-tender with normal pitched bowel sounds.  MUSCULOSKELETAL: He has bilateral knee tenderness to palpation, left greater than right NEUROLOGIC: No focal weakness or paresthesias are detected. SKIN: There are no ulcers or rashes noted. PSYCHIATRIC: The patient has a normal affect.  STUDIES:   I have reviewed the following CTA: 1. No aortic dissection or intramural hematoma. 2. 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 Recommend follow-up ultrasound every 3 years. 3. 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 4. 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 Moderate narrowing at the origin of the right main renal artery. 5. 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 Visualized segments of the left profunda femoris artery are occluded due to dense calcified plaque at the origin. 6. Severe stenosis of  the proximal left SFA due to densely calcified plaque. 7. 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 8. Extensive descending and sigmoid colon diverticulosis without evidence of acute diverticulitis. 9. Moderate prostatomegaly.   +-------+-----------+-----------+------------+------------+  ABI/TBIToday's ABIToday's TBIPrevious ABIPrevious TBI  +-------+-----------+-----------+------------+------------+  Right 0.69       0.52       0.66        0.46          +-------+-----------+-----------+------------+------------+  Left  0.69       0.73       0.84        0.67          +-------+-----------+-----------+------------+------------+  Right toe pressure: 65 Left toe pressure: 90 Waveforms are biphasic  ASSESSMENT and PLAN   PAD: The patient does not describe classic signs of arterial insufficiency although he does have significant vascular findings on CT angiogram including near occlusion of the left common femoral artery as well as left SFA stenosis.  On exam he has tenderness to palpation of both knees which appear to be a significant problem and likely his main issue.  I have spoken with Dr. Rito Ehrlich who is going to pursue further workup for this.  The patient follows Dr. Kirke Corin for his PAD.  I have communicated with him tonight and he will plan on assuming his vascular care tomorrow.  If there are any surgical issues, I would be happy to assist.   Durene Cal, IV, MD, FACS [facility] [phone] Pager [phone]

## 2024-02-05 NOTE — Plan of Care (Signed)

## 2024-02-05 NOTE — H&P (Signed)
 History and Physical    Steven Newman WGN:[id number] DOB: Jun 23, 1941 DOA: 02/04/2024  PCP: Lorenda Ishihara, MD   Patient coming from: Home   Chief Complaint:  Chief Complaint  Patient presents with   Weakness   ED TRIAGE note:Pt sts he doesn't have the strength to get up and walk; reports sxs started 2d ago; he had to have help with using restroom today; tachypnea noted; reports chills   HPI:  Steven Newman is a 83 y.o. 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 During my evaluation at the bedside patient reported that he has dyspnea on exertion and paroxysmal nocturnal dyspnea as well.  Currently have to use 2-3 pillows to lift himself up during sleep.  Denies any chest pain, palpitation, headache, blurry vision, diaphoresis, nausea, change of appetite and weight.  Complaining about bilateral lower extremities pain more severe on the left side as compared to right, bilateral lower extremities pain aggravated by walking and improves with rest.  Patient denies any postprandial abdominal and periumbilical pain. Denies any noticing black tarry stool or bright red per rectum.  He is also stating that he has history of skin cancer of his forehead and face 2-3 times and currently has been using topical regimen and his wife going to bring to show [place] tomorrow.  Unable to find any record on the chart. Unable to verify with patient's which medication he has been taking or not.  Patient is alert oriented and answering all the questions appropriately.   ED Course:  At presentation to ED patient  found hemodynamically stable.  O2 sat 100% room air. CBC unremarkable. CMP showing creatinine 1.6 which is around baseline, low albumin 3.4 elevated AST 46. Troponin x 2 unremarkable. Elevated BNP around 900. UA showed elevated blood glucose above 500 and protein 100. Urine microscopy rare bacteria.   CTA chest: No aortic dissection or intramural hematoma. 2. 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 Recommend follow-up ultrasound every 3 years. 3. 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 4. 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 Moderate narrowing at the origin of the right main renal artery. 5. 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 Visualized segments of the left profunda femoris artery are occluded due to dense calcified plaque at the origin. 6. Severe stenosis of the proximal left SFA due to densely calcified plaque. 7. 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 8. Extensive descending and sigmoid colon diverticulosis without evidence of acute diverticulitis. 9. Moderate prostatomegaly.  Chest x-ray no acute cardiopulmonary process.  With the concern for CHF exacerbation in the ED patient has been treated with Lasix 20 mg IV.  Patient has been transferred from [facility] to [facility] for animator of CHF exacerbation.  Hospitalist has been consulted for further evaluation management of acute on chronic CHF and generalized weakness.  Significant labs in the ED: Lab Orders  Resp panel by RT-PCR (RSV, Flu A&B, Covid) Anterior Nasal Swab         CBC with Differential         Comprehensive metabolic panel         Urinalysis, Routine w reflex microscopic -Urine, Clean Catch          Brain natriuretic peptide         Urinalysis, Microscopic (reflex)         Glucose, capillary         Lipid panel         Comprehensive metabolic panel         CBC         Hemoglobin A1c       Review of Systems:  Review of Systems  Constitutional:  Positive for malaise/fatigue. Negative for chills, fever and weight loss.  Respiratory:  Positive for shortness of breath. Negative for cough, sputum production and wheezing.   Cardiovascular:  Positive for orthopnea and PND. Negative for chest pain, palpitations, claudication and leg swelling.  Gastrointestinal:  Negative for abdominal pain, heartburn, nausea and vomiting.  Musculoskeletal:  Negative for back pain, falls, joint pain, myalgias and neck pain.       Bilateral lower extremities pain.  Neurological:  Negative for dizziness and headaches.  Psychiatric/Behavioral:  The patient is not nervous/anxious.   All other systems reviewed and are negative.   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   Myocardial infarction    Neck pain  03/05/2015   Obesity    OSA (obstructive sleep apnea) 08/09/2013    October 2014:AHI of 14 , RDI 33/h and desaturation to 88%    Osteoarthritis of left knee 04/24/2021   Osteoarthritis of right knee 04/24/2021   Pain in joint of left shoulder 01/14/2021   Peripheral arterial disease    Pneumonia    Rotator cuff tear arthropathy 05/14/2021   Skin cancer    Thrombocytopenia    Type 2 diabetes mellitus with peripheral angiopathy    Urinary incontinence    Venous insufficiency (chronic) (peripheral)    Vitamin D deficiency    Wheezing     Past Surgical History:  Procedure Laterality Date   bladder cancer surgery     CARDIAC CATHETERIZATION     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 His smoking use included cigarettes. He started smoking about 58 years ago. He has a 100 pack-year smoking history. He has never used smokeless tobacco. He reports current alcohol use. He reports that he does not use drugs.  Allergies  Allergen Reactions   Ace Inhibitors Other (See Comments)    Angioedema  Other reaction(s): angioedema   Amoxicillin-Pot Clavulanate Other (See Comments)    Other reaction(s): hives   Other Other (See Comments)   Penicillins Swelling and Rash    Lips swell    Family History  Problem Relation Age of Onset   Heart disease Mother    Rheumatic fever Father    Rheumatic fever Brother    Diabetes Sister    Cancer Sister        cancer on eyelid    Prior to Admission medications   Medication Sig Start Date End Date Taking? Authorizing Provider  CEPHALEXIN PO Take 500 mg by mouth 3 (three) times daily.   Yes [provider]  triamcinolone cream (KENALOG) 0.1 % Apply 1 Application topically 2 (two) times  daily.   Yes [provider]  acetaminophen (TYLENOL) 325 MG tablet Take 650 mg by mouth every 6 (six) hours as needed.    [provider]  albuterol (PROVENTIL) (2.5 MG/3ML) 0.083% nebulizer solution Take 2.5 mg by nebulization every 6 (six) hours as needed for wheezing or shortness of breath.    [provider]  ASPIRIN LOW DOSE 81 MG tablet TAKE 1 TABLET DAILY 08/30/23   Corky Crafts, MD  Boswellia-Glucosamine-Vit D (OSTEO BI-FLEX ONE PER DAY PO) Take 2 tablets by mouth daily.    [provider]  Cholecalciferol (VITAMIN D-3) 125 MCG (5000 UT) TABS Take 5,000 Units by mouth daily.    [provider]  Coenzyme Q10 (CO Q-10 PO) Take 10 mLs by mouth daily.    [provider]  mirabegron ER (MYRBETRIQ) 25 MG TB24 tablet Take 25 mg by mouth daily.    [provider]  Multiple Vitamins-Minerals (AIRBORNE) PACK Take by mouth.    [provider]  nadolol (CORGARD) 40 MG tablet Take 1 tablet (40 mg total) by mouth daily. 12/12/17   Robbie Lis M, PA-C  nitroGLYCERIN (NITROSTAT) 0.4 MG SL tablet Place 1 tablet (0.4 mg total) under the tongue every 5 (five) minutes as needed for chest pain. 02/01/22   Corky Crafts, MD  rosuvastatin (CRESTOR) 40 MG tablet Take 1 tablet (40 mg total) by mouth daily. 07/07/23 10/05/23  Iran Ouch, MD  Tiotropium Bromide-Olodaterol (STIOLTO RESPIMAT) 2.5-2.5 MCG/ACT AERS Inhale 2 puffs into the lungs daily. 11/06/21   Mannam, Colbert Coyer, MD  TURMERIC PO Take 1,350 mg by mouth daily.    [provider]  umeclidinium-vilanterol (ANORO ELLIPTA) 62.5-25 MCG/ACT AEPB Inhale 1 puff into the lungs daily. 11/03/21   Mannam, Colbert Coyer, MD  vitamin B-12 (CYANOCOBALAMIN) 1000 MCG tablet Take 1,000 mcg by mouth daily.    [provider]     Physical Exam: Vitals:   02/04/24 2315 02/04/24 2330 02/05/24 0030 02/05/24 0142  BP: (!) 140/76 117/62    Pulse: 71 70    Resp: (!) 22 17  15    Temp: 98 F (36.7 C)  98.4 F (36.9 C)   TempSrc: Oral  Axillary   SpO2: 95% 96% 95%   Weight:    89.5 kg  Height:        Physical Exam Constitutional:  Appearance: Normal appearance.  HENT:     Mouth/Throat:     Mouth: Mucous membranes are moist.  Eyes:     Conjunctiva/sclera: Conjunctivae normal.  Cardiovascular:     Rate and Rhythm: Normal rate and regular rhythm.     Pulses: Normal pulses.     Heart sounds: Normal heart sounds.  Pulmonary:     Effort: Pulmonary effort is normal.     Breath sounds: Normal breath sounds.  Abdominal:     General: Bowel sounds are normal.     Palpations: Abdomen is soft.  Musculoskeletal:        General: No swelling, tenderness or deformity.     Cervical back: Normal range of motion and neck supple.     Right lower leg: No edema.     Left lower leg: No edema.     Comments: Bilateral lower extremities peripheral dorsalis pedis pulses 2+.  Skin:    General: Skin is warm.     Capillary Refill: Capillary refill takes less than 2 seconds.  Neurological:     Mental Status: He is alert and oriented to person, place, and time.     Motor: No weakness.  Psychiatric:        Mood and Affect: Mood normal.      Labs on Admission: I have personally reviewed following labs and imaging studies  CBC: Recent Labs  Lab 02/04/24 1423  WBC 8.4  NEUTROABS 6.1  HGB 14.9  HCT 46.2  MCV 93.7  PLT 152   Basic Metabolic Panel: Recent Labs  Lab 02/04/24 1423  NA 135  K 4.8  CL 105  CO2 22  GLUCOSE 154*  BUN 37*  CREATININE 1.66*  CALCIUM 8.9   GFR: Estimated Creatinine Clearance: 37 mL/min (A) (by C-G formula based on SCr of 1.66 mg/dL (H)). Liver Function Tests: Recent Labs  Lab 02/04/24 1423  AST 35  ALT 46*  ALKPHOS 56  BILITOT 0.6  PROT 7.8  ALBUMIN 3.4*   No results for input(s): "LIPASE", "AMYLASE" in the last 168 hours. No results for input(s): "AMMONIA" in the last 168 hours. Coagulation Profile: No results  for input(s): "INR", "PROTIME" in the last 168 hours. Cardiac Enzymes: Recent Labs  Lab 02/04/24 1423 02/04/24 1730  TROPONINIHS 15 16   BNP (last 3 results) Recent Labs    02/04/24 1423  BNP 896.6*   HbA1C: No results for input(s): "HGBA1C" in the last 72 hours. CBG: Recent Labs  Lab 02/05/24 0042  GLUCAP 136*   Lipid Profile: No results for input(s): "CHOL", "HDL", "LDLCALC", "TRIG", "CHOLHDL", "LDLDIRECT" in the last 72 hours. Thyroid Function Tests: No results for input(s): "TSH", "T4TOTAL", "FREET4", "T3FREE", "THYROIDAB" in the last 72 hours. Anemia Panel: No results for input(s): "VITAMINB12", "FOLATE", "FERRITIN", "TIBC", "IRON", "RETICCTPCT" in the last 72 hours. Urine analysis:    Component Value Date/Time   COLORURINE YELLOW 02/04/2024 1736   APPEARANCEUR CLEAR 02/04/2024 1736   LABSPEC 1.015 02/04/2024 1736   PHURINE 5.5 02/04/2024 1736   GLUCOSEU >=500 (A) 02/04/2024 1736   HGBUR NEGATIVE 02/04/2024 1736   BILIRUBINUR NEGATIVE 02/04/2024 1736   KETONESUR NEGATIVE 02/04/2024 1736   PROTEINUR 100 (A) 02/04/2024 1736   NITRITE NEGATIVE 02/04/2024 1736   LEUKOCYTESUR NEGATIVE 02/04/2024 1736    Radiological Exams on Admission: I have personally reviewed images CT Angio Chest/Abd/Pel for Dissection W and/or Wo Contrast Result Date: 02/04/2024 CLINICAL DATA:  Acute aortic syndrome suspected. Weakness. Tachypnea. Chills. EXAM: CT ANGIOGRAPHY CHEST, ABDOMEN AND  PELVIS TECHNIQUE: Non-contrast CT of the chest was initially obtained. Multidetector CT imaging through the chest, abdomen and pelvis was performed using the standard protocol during bolus administration of intravenous contrast. Multiplanar reconstructed images and MIPs were obtained and reviewed to evaluate the vascular anatomy. RADIATION DOSE REDUCTION: This exam was performed according to the departmental dose-optimization program which includes automated exposure control, adjustment of the mA and/or kV  according to patient size and/or use of iterative reconstruction technique. CONTRAST:  80mL OMNIPAQUE IOHEXOL 350 MG/ML SOLN COMPARISON:  Chest two-view 02/04/2024 FINDINGS: CTA CHEST FINDINGS Cardiovascular: Heart size within normal limits. Severe triple vessel coronary artery calcifications. No pulmonary artery embolism. No aortic dissection or intramural hematoma. Mediastinum/Nodes: No enlarged mediastinal, hilar, or axillary lymph nodes. Thyroid gland, trachea, and esophagus demonstrate no significant findings. Lungs/Pleura: No focal airspace opacity to indicate pneumonia. Scattered tiny calcified pulmonary nodules consistent with prior granulomatous inflammation. Musculoskeletal: Numerous old healed bilateral rib fractures are seen. No acute osseous abnormality. Review of the MIP images confirms the above findings. CTA ABDOMEN AND PELVIS FINDINGS VASCULAR Aorta: No aortic dissection. Calcified and noncalcified atheromatous plaque seen throughout the abdominal aorta with mild aneurysm of the infrarenal segment measuring up to 3.1 x 3.0 cm. Celiac: Patent without evidence of aneurysm, dissection, vasculitis or significant stenosis. 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 Moderate narrowing at the origin of the right main renal artery. IMA: Near complete occlusion at the origin of the IMA, which is otherwise patent. Right lower extremity arteries: Mild narrowing of the common iliac artery origin due to calcified atheromatous plaque. 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 Moderate to severe stenosis at the origin of the right internal iliac artery which is diffusely calcified but otherwise patent. Mild diffuse narrowing of the right external iliac artery due to calcified  atheromatous plaque. Irregular appearance of the right common femoral artery with a possible nonflow limiting focal dissection. Stranding anterior to the right common femoral artery likely related to prior surgical intervention. No high-grade stenosis of the visualized proximal profunda femoris are superficial femoral arteries. Left lower extremity arteries: Diffuse calcified and noncalcified atheromatous plaque of the left common iliac artery without flow-limiting stenosis. Proximal left internal iliac artery is occluded. Opacification of distal branches consistent with retrograde collateral flow. Mild diffuse narrowing of the external iliac artery due to diffusely calcified plaque. 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 Moderate stenosis of the distal left common femoral artery due to densely calcified plaque. Visualized segments of the left profunda femoris artery are occluded due to dense calcified plaque at the origin. Severe stenosis of the proximal left SFA due to densely calcified plaque. Veins: No obvious venous abnormality within the limitations of this arterial phase study. Review of the MIP images confirms the above findings. NON-VASCULAR Hepatobiliary: Mild pneumobilia likely related to prior sphincterotomy. Please correlate with patient history. Liver, gallbladder, and bile ducts are otherwise normal. Pancreas: Diffuse fatty atrophy of the pancreas which is otherwise unremarkable. Spleen: Normal in size without focal abnormality. Adrenals/Urinary Tract: Adrenal glands are normal. Subcentimeter renal hypodensities are too small to fully characterize, however most likely represent simple cysts which do not require dedicated imaging follow-up. Multiple additional simple cysts are also seen bilaterally with the largest  located in the left kidney measuring 4.2 cm. These simple cysts do not require dedicated imaging surveillance. No  hydronephrosis or hydroureter. Bladder is normal. Stomach/Bowel: No bowel dilatation to indicate ileus or obstruction. Extensive descending and sigmoid colon diverticulosis without evidence of acute diverticulitis. Appendix is normal. Lymphatic: No enlarged abdominopelvic lymph nodes. Reproductive: Moderate prostatomegaly. Other: No abdominal wall hernia or abnormality. No abdominopelvic ascites. Musculoskeletal: No acute or significant osseous findings. Review of the MIP images confirms the above findings. IMPRESSION: 1. No aortic dissection or intramural hematoma. 2. 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 Recommend follow-up ultrasound every 3 years. 3. 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 4. 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 Moderate narrowing at the origin of the right main renal artery. 5. 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 Visualized segments of the left profunda femoris artery are occluded due to dense calcified plaque at the origin. 6. Severe stenosis of the proximal left SFA due to densely calcified plaque. 7. 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 8. Extensive descending and sigmoid colon diverticulosis without evidence of acute diverticulitis. 9. Moderate prostatomegaly. Electronically Signed   By: Acquanetta Belling M.D.   On: 02/04/2024 18:07   DG Chest 2 View Result Date: 02/04/2024 CLINICAL DATA:  Weakness for 2 days.  Tachypnea and chills. EXAM: CHEST - 2 VIEW COMPARISON:  Radiographs 09/16/2021 and 07/29/2020.  CT 08/18/2015. FINDINGS: Lordotic positioning on the frontal examination. The heart size is stable at the upper limits of normal. There is mild aortic atherosclerosis. The lungs appear clear. No  evidence of pleural effusion or pneumothorax. Previous left shoulder reverse arthroplasty without evidence of acute osseous abnormality. IMPRESSION: Stable examination.  No evidence of active cardiopulmonary process. Electronically Signed   By: Carey Bullocks M.D.   On: 02/04/2024 16:03     EKG: My personal interpretation of EKG shows:  EKG showing normal sinus rhythm heart rate 79, atrial premature complex.  Borderline prolonged PR interval.    Assessment/Plan: Principal Problem:   Acute on chronic combined systolic and diastolic CHF (congestive heart failure) (HCC) Active Problems:   Generalized weakness   History of COPD   Hyperlipidemia   History of skin cancer   Essential hypertension   Peripheral artery disease (HCC)   Chronic kidney disease, stage 3a (HCC)   History of abdominal aortic aneurysm (AAA)   Non-insulin dependent type 2 diabetes mellitus (HCC)   BPH (benign prostatic hyperplasia)    Assessment and Plan: Acute on chronic CHF exacerbation > Patient present emergency department complaining of generalized weakness, dyspnea exertion and bilateral lower extremity swelling. -At presentation to ED patient is hemodynamically stable - CBC and CMP grossly unremarkable.  CMP showing stable renal function. - Normal troponin x 2 and EKG showing sinus rhythm heart rate 77.  There is no history of abnormality. -Elevated BNP around 900. - Chest x-ray no evidence of active cardiopulmonary process - CTA chest no aortic dissection or intramural hematoma.  Stable infrarenal aortic aneurysm.  However CTA chest showed significant vascular disease including mesenteric artery, renal artery, left femoral artery proximal left SFA. -Per chart review previous echo from 09/2021 grade 2 diastolic heart failure and reduced EF 40 to 45%. -In the ED patient has been treated with IV  Lasix 20 mg. - Plan to continue IV Lasix 20 mg twice daily.  Continue daily weight, strict I's/O fluid restriction  and salt restriction.  -Patient's blood pressure is borderline soft holding nadolol also need to verify with pharmacy if patient is currently on nadolol at home or not.  Currently not any other blood pressure regimen at home. -If patient renal function remains stable and blood pressure allows need to start GDMT guided medications include ACE inhibitor/ARB/mineralocorticoid receptor blocker and Jardiance. -Obtaining echocardiogram. - Continue cardiac monitoring.  Generalized weakness-multifactorial -Patient presenting complaining of generalized weakness likely in the setting of severe peripheral vascular disease, CHF exacerbation and age-related chronic debility. -Treating for chronic problems. - Consulting inpatient PT and OT for evaluation of balance. - Continue fall precaution.  History of CAD and prior MI and PCI in mid 90s - Continue aspirin.  Patient has not been taking Lipitor for more than a year.  Restarting Lipitor.   History of COPD -Stable.  O2 sat 95 to 100% on room air. -Continue Anoro Ellipta once daily and DuoNeb as needed for wheezing shortness of breath.  Hyperlipidemia Patient has not been taking Lipitor since last year.  Checking lipid panel.  Starting Crestor in the setting of severe peripheral vascular disease. -CTA finding showing complete occlusion of the left femoral common artery and proximal left SFA. - Checking lipid panel - Patient will be benefit from outpatient exercise therapy. -Please consult vascular surgery in the daytime.  -History of peripheral vascular disease. -CTA finding showing complete occlusion of the left femoral common artery and proximal left SFA. - Checking lipid panel - Patient will be benefit from outpatient exercise therapy. -Please consult vascular surgery in the daytime.  History of AAA - CTA chest showing stable infrarenal abdominal aortic measuring 3.1 cm.  Recommended follow-up ultrasound in 3 years.  Severe vascular  disease - CTA chest and abdomen pelvis showing severe peripheral vascular disease including near complete occlusion of the left main renal artery, proximal superior mesenteric artery, left common femoral artery, proximal left SFA. -History of chronic smoking and noncompliance with this statin. -Most recent vascular duplex from 01/2023 bilateral lower extremity ABI index abnormal. -Obtaining bilateral lower extremity ABI index study. -There is no concern for development of mesenteric ischemia,.  No acute abdominal and peripheral arterial disease sign on physical exam. - Please consult vascular surgery in the daytime for evaluation. -Reinitiating Lipitor.   CKD stage IIIa -Creatinine 1.6.  Baseline creatinine hovering around 1.3-1.6 in last 2 to 5 years.  Renal function at baseline and stable. -Nephrotoxic agents.  Renally adjust medications and monitor urine output.   BPH -Continue mirabegron  History of skin cancer of the forehead - Patient reported he has been seeing outpatient dermatology skin cancer skin cancer and continue on topical regimen.  Unable to verify if it is basal cell or squamous cell cancer.  Unable to find any record on care everywhere.  Patient reported his wife going to bring the topical regimen tomorrow to the hospital to show [place].  History of bladder cancer -Unable to find any record of urology on the chart.  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   Insulin independent DM type II - Previous A1c 6.5 in 2022.  Checking A1c level again. - Continue carb modified diet. -Continue check POC blood glucose.   DVT prophylaxis:  SQ Heparin Code Status:  Full Code Diet: Heart healthy carb modified diet Family Communication:   Family was present at bedside,  at the time of interview. Opportunity was given to ask question and all questions were answered satisfactorily.  Disposition Plan: Continue to monitor improvement of volume status  with IV diuretics.  Pending echocardiogram.  Need to reach out to vascular surgeon regarding severe vascular disease. Consults: Heart failure team. Admission status:   Inpatient, Telemetry bed  Severity of Illness: The appropriate patient status for this patient is INPATIENT. Inpatient status is judged to be reasonable and necessary in order to provide the required intensity of service to ensure the patient's safety. The patient's presenting symptoms, physical exam findings, and initial radiographic and laboratory data in the context of their chronic comorbidities is felt to place them at high risk for further clinical deterioration. Furthermore, it is not anticipated that the patient will be medically stable for discharge from the hospital within 2 midnights of admission.   * I certify that at the point of admission it is my clinical judgment that the patient will require inpatient hospital care spanning beyond 2 midnights from the point of admission due to high intensity of service, high risk for further deterioration and high frequency of surveillance required.Marland Kitchen    Tereasa Coop, MD [facility]  How to contact the  [facility] Attending or Consulting provider 7A - 7P or covering provider during after hours 7P -7A, for this patient.  Check the care team in [facility] and look for a) attending/consulting TR[facility] provider listed and b) the [facility] team listed Log into [email or web address] and use [place]'s universal password to access. If you do not have the password, please contact the hospital operator. Locate the [facility] provider you are looking for under [facility] and page to a number that you can be directly reached. If you still have difficulty reaching the provider, please page the [facility] (Director on Call) for the Hospitalists listed on amion for assistance.  02/05/2024, 1:43 AM

## 2024-02-05 NOTE — Evaluation (Addendum)
 Occupational Therapy Evaluation Patient Details Name: Steven Newman MRN: [id number] DOB: 23-Nov-1941 Today's Date: 02/05/2024   History of Present Illness   Pt is a 83 y.o. male presenting 02/04/24 with a 2 day hx of bil LE weakness, LE swelling, DOE, and inability to walk. Admitted with systolic and diastolic CHF. PMHx includes CAD, previous MI, combined CHF, peripheral arterial disease, DM type II, essential hypertension, AAA, CKD 3 AA OSA not on CPAP, history of bladder cancer, prior history of agent orange exposure and HLD.     Clinical Impressions At baseline, pt is Independent with ADLs and functional mobility without an AD and drives. Pt now presents with decreased activity tolerance, decreased cognition, decreased L side gross motor coordination, decreased L side proprioception, pain in L knee affecting functional level, and decreased safety and independence with functional tasks. Pt currently demonstrates ability to complete UB ADLs with Set up to Min assist, LB ADLs with Max assist, and bed mobility during/in preparation for functional tasks with Contact guard to Min assist with cues. Pt participated well in session, is motivated to return to baseline PLOF, and has good support from wife. Pt will benefit from acute skilled OT services to address deficits outlined below and to increase safety and independence with functional tasks. Post acute discharge, pt will benefit from intensive inpatient skilled rehab services > 3 hours per day to maximize rehab potential, decrease caregiver burden, and decrease risk of falls or readmissions.      If plan is discharge home, recommend the following:   Two people to help with walking and/or transfers;A lot of help with bathing/dressing/bathroom;Assistance with cooking/housework;Assistance with feeding;Direct supervision/assist for medications management;Direct supervision/assist for financial management;Assist for transportation;Help with stairs or ramp for  entrance (Set up for self feeding)     Functional Status Assessment   Patient has had a recent decline in their functional status and demonstrates the ability to make significant improvements in function in a reasonable and predictable amount of time.     Equipment Recommendations   BSC/3in1;Tub/shower seat     Recommendations for Other Services   Rehab consult     Precautions/Restrictions   Precautions Precautions: Fall Restrictions Weight Bearing Restrictions Per Provider Order: No     Mobility Bed Mobility Overal bed mobility: Needs Assistance Bed Mobility: Rolling, Sidelying to Sit, Sit to Supine Rolling: Contact guard assist, Used rails Sidelying to sit: Min assist, HOB elevated, Used rails   Sit to supine: Min assist   General bed mobility comments: Pt required min A for trunk elevation and bringing hips to EOB with bed pad. Min A for LE elevation during sit to supine. Required cues throughout for hand placement/technique and sequencing.    Transfers Overall transfer level: Needs assistance                 General transfer comment: Not attempted this session for pt/therapist safety as pt required Mod assist +2 with use of Stedy for STS transfer earlier this day and second person not available to assist at this time      Balance Overall balance assessment: Needs assistance Sitting-balance support: Single extremity supported, No upper extremity supported, Feet supported Sitting balance-Leahy Scale: Good Sitting balance - Comments: Initally Fair upon sitting EOB with pt requiring R UE support but progressing to Good with pt demonstrating Good dynamic balance without UE support  ADL either performed or assessed with clinical judgement   ADL Overall ADL's : Needs assistance/impaired Eating/Feeding: Set up;Sitting   Grooming: Set up;Sitting   Upper Body Bathing: Contact guard assist;Minimal  assistance;Sitting;Cueing for sequencing   Lower Body Bathing: Maximal assistance;Bed level;Cueing for compensatory techniques;Cueing for sequencing   Upper Body Dressing : Contact guard assist;Minimal assistance;Sitting;Cueing for sequencing   Lower Body Dressing: Maximal assistance;Bed level;Cueing for compensatory techniques;Cueing for sequencing     Toilet Transfer Details (indicate cue type and reason): unable this session Toileting- Clothing Manipulation and Hygiene: Maximal assistance;Bed level;Cueing for sequencing;Cueing for compensatory techniques         General ADL Comments: Pt with decreased activity tolerance, decreased cognition, L knee pain, and decreased L UE/LE coordination and proprioception affecting functional level.     Vision Baseline Vision/History: 1 Wears glasses (readers) Patient Visual Report: No change from baseline Additional Comments: OT plans to further assess vision during next session     Perception         Praxis         Pertinent Vitals/Pain Pain Assessment Pain Assessment: Faces Faces Pain Scale: Hurts even more Pain Location: L knee Pain Descriptors / Indicators: Grimacing, Guarding, Moaning Pain Intervention(s): Limited activity within patient's tolerance, Monitored during session, Repositioned     Extremity/Trunk Assessment Upper Extremity Assessment Upper Extremity Assessment: Right hand dominant;RUE deficits/detail;LUE deficits/detail RUE Deficits / Details: gross strength 4/5; ROM WFL; mild pain with PROM external/internal rotation of shoulder; coordination and sensation WFL RUE Sensation: WNL RUE Coordination: WNL LUE Deficits / Details: gross strength 4/5; AROM WFL; decreased proprioception; decreased gross motor coordination; missing a portion of three digits of hands due to work injury >40 years ago, pt with functional use of L hand at baseline LUE Sensation: decreased proprioception LUE Coordination: decreased gross  motor   Lower Extremity Assessment Lower Extremity Assessment: Defer to PT evaluation   Cervical / Trunk Assessment Cervical / Trunk Assessment: Normal   Communication Communication Communication: Impaired Factors Affecting Communication: Hearing impaired   Cognition Arousal: Alert Behavior During Therapy: WFL for tasks assessed/performed Cognition: Cognition impaired     Awareness: Intellectual awareness intact, Online awareness impaired Memory impairment (select all impairments): Short-term memory, Working Civil Service fast streamer, Conservation officer, historic buildings Attention impairment (select first level of impairment): Selective attention Executive functioning impairment (select all impairments): Organization, Reasoning, Problem solving, Sequencing OT - Cognition Comments: Pt AAOx4 and pleasant throughout session but with noted cognitive deficits above. [name] reports pt requiring directions and getting lost while driving to familier places since fall in January when pt hit R shoulder.                 Following commands: Intact       Cueing  General Comments   Cueing Techniques: Verbal cues;Tactile cues;Visual cues   VSS on RA throughout session. [name] present throughout session. RN present during a portion of session. Imaging staff entering room at end of session    Exercises     Shoulder Instructions      Home Living Family/patient expects to be discharged to:: Private residence Living Arrangements: Spouse/significant other Available Help at Discharge: Family;Available 24 hours/day Type of Home: House Home Access: Stairs to enter [employer] of Steps: 5-6   Home Layout: Two level Alternate Level Stairs-Number of Steps: flight. pt uses stair lift to navigate stairs   Bathroom Shower/Tub: Walk-in shower   Bathroom Toilet: Handicapped height     Home Equipment: Rollator (4 wheels);Cane - single point  Additional Comments: pt lives with wife who completes  most of the household chores. Wife isn't able to provide pt lifting assistance.      Prior Functioning/Environment Prior Level of Function : Independent/Modified Independent;Driving;History of Falls (last six months)             Mobility Comments: Pt performs functional mobility Independent to Mod I with intermittent use of cane or Rollator. Pt and wife report 4 falls since December 2024. [name] also reports one instance in January of pt not able to get out of bed that resolved without intervention. Wife described pt as appearing, "like he was a turtle on his back for a few seconds," prior to pt resuming baseline functional level with bed mobility. ADLs Comments: At baseline, pt is Independent with ADLs and IADLs. However, pt's wife reports hse completes most of the meal prep and cleaning in the home. Pt drives. However, pt's wife reports that since fall in January in wich pt injured his R shoulder, the pt has been getting lost while driving and requiring directions to drive to familiar places.    OT Problem List: Decreased activity tolerance;Impaired balance (sitting and/or standing);Decreased coordination;Decreased cognition;Decreased knowledge of use of DME or AE;Pain   OT Treatment/Interventions: Self-care/ADL training;DME and/or AE instruction;Therapeutic activities;Cognitive remediation/compensation;Patient/family education;Balance training      OT Goals(Current goals can be found in the care plan section)   Acute Rehab OT Goals Patient Stated Goal: to return home and be able to walk independently OT Goal Formulation: With patient/family Time For Goal Achievement: 02/19/24 Potential to Achieve Goals: Good ADL Goals Pt Will Perform Upper Body Bathing: with supervision;sitting Pt Will Perform Lower Body Bathing: with min assist;sitting/lateral leans;sit to/from stand Pt Will Perform Lower Body Dressing: with min assist;sitting/lateral leans;sit to/from stand Pt Will Transfer to  Toilet: with min assist;ambulating;bedside commode (with least restrictive AD) Pt Will Perform Toileting - Clothing Manipulation and hygiene: with min assist;sitting/lateral leans;sit to/from stand Pt/caregiver will Perform Home Exercise Program: Both right and left upper extremity;With Supervision;With written HEP provided (Increased L UE gross motor coordination; increased activity tolerance) Additional ADL Goal #1: Patient will participate in vision screening during skilled OT session to aide in identifying potential visual deficits that may affect safety and independence with functional tasks.   OT Frequency:  Min 1X/week    Co-evaluation              AM-PAC OT "6 Clicks" Daily Activity     Outcome Measure Help from another person eating meals?: A Little Help from another person taking care of personal grooming?: A Little Help from another person toileting, which includes using toliet, bedpan, or urinal?: A Lot Help from another person bathing (including washing, rinsing, drying)?: A Lot Help from another person to put on and taking off regular upper body clothing?: A Little Help from another person to put on and taking off regular lower body clothing?: A Lot 6 Click Score: 15   End of Session Nurse Communication: Mobility status;Other (comment) (Pt with decreased proprioception and coordination on Left side. Pt with decreased short-term, working, and Geneticist, molecular memory deficits.)  Activity Tolerance: Patient tolerated treatment well Patient left: in bed;with call bell/phone within reach;with family/visitor present;Other (comment) (Imaging staff coming into room)  OT Visit Diagnosis: Unsteadiness on feet (R26.81);Other abnormalities of gait and mobility (R26.89);Repeated falls (R29.6);History of falling (Z91.81);Ataxia, unspecified (R27.0);Other symptoms and signs involving cognitive function;Pain;Other (comment) (decreased activity tolerance)  Time: 4010-2725 OT  Time Calculation (min): 30 min Charges:  OT General Charges $OT Visit: 1 Visit OT Evaluation $OT Eval Moderate Complexity: 1 Mod OT Treatments $Self Care/Home Management : 8-22 mins  Tonnya Garbett "Orson Eva., OTR/L, MA Acute Rehab [phone]   Lendon Colonel 02/05/2024, 6:40 PM

## 2024-02-05 NOTE — Care Management (Signed)
 Transition of Care [facility]) - Inpatient Brief Assessment   Patient Details  Name: Steven Newman MRN: [id number] Date of Birth: [birth year]  Transition of Care [facility]) CM/SW Contact:    Lockie Pares, RN Phone Number: 02/05/2024, 5:09 PM   Clinical Narrative: 83 yo presented with not able to ambulate. He lives with wife at home, they have no family in the area. Wife  states she cannot take care of him at home by herself.  PT recommending CIR. The patient and wife both feel this would be good for him. He has a positive attitude.    TOC will follow   Transition of Care Asessment: Insurance and Status: Insurance coverage has been reviewed Patient has primary care physician: Yes Home environment has been reviewed: Lives with wife at home Prior level of function:: Independent has cane, walker lift for going [employer] Prior/Current Home Services: No current home services Social Drivers of Health Review: SDOH reviewed no interventions necessary Readmission risk has been reviewed: Yes Transition of care needs: transition of care needs identified, TOC will continue to follow

## 2024-02-05 NOTE — Evaluation (Signed)
 Physical Therapy Evaluation Patient Details Name: Steven Newman MRN: [id number] DOB: Feb 12, 1941 Today's Date: 02/05/2024  History of Present Illness  Pt is a 83 y.o. male presenting 02/04/24 with a 2 day hx of bil LE weakness, LE swelling, DOE, and inability to walk. Admitted with systolic and diastolic CHF. PMHx includes CAD, previous MI, combined CHF, peripheral arterial disease, DM type II, essential hypertension, AAA, CKD 3 AA OSA not on CPAP, history of bladder cancer, prior history of agent orange exposure and HLD.  Clinical Impression  Pt is a 83 y.o. male presenting on 02/05/24 with above conditions and deficits below, see PT Problem List. PTA pt lived with his wife in a two story home with a flight of stairs in which he used a chair lift to navigate. He intermittently ambulated with a cane or rollator, drove, and had two falls since January 2025 in his garage. Pt complained of L knee throughout the session, particularly with dynamic movements, such as bed mobility and transfers. He was reluctant at times to put weight through it and was TTP around the distal hamstring and anterolateral aspect of his L knee. Currently pt is CGA-min A for bed mobility and mod A x2 for transfers. Pt displays deficits in activity tolerance, dynamic/static balance, functional mobility, pain, and safe DME use. Given's pt's current medical status, available family support upon d/c, PLOF, and motivation to improve, he would benefit from intense acute rehab > 3 hours upon d/c and continued acute PT. Will continue to follow acutely.           If plan is discharge home, recommend the following: A lot of help with walking and/or transfers;A lot of help with bathing/dressing/bathroom;Assistance with cooking/housework;Assist for transportation;Help with stairs or ramp for entrance   Can travel by private vehicle        Equipment Recommendations BSC/3in1;Other (comment) (grab bars for shower and toilet)  Recommendations for  Other Services  Rehab consult;Other (comment) (ortho consult)    Functional Status Assessment Patient has had a recent decline in their functional status and demonstrates the ability to make significant improvements in function in a reasonable and predictable amount of time.     Precautions / Restrictions Precautions Precautions: Fall Recall of Precautions/Restrictions: Intact Restrictions Weight Bearing Restrictions Per Provider Order: No      Mobility  Bed Mobility Overal bed mobility: Needs Assistance Bed Mobility: Rolling, Sidelying to Sit, Sit to Supine Rolling: Contact guard assist, Used rails Sidelying to sit: Min assist, HOB elevated, Used rails   Sit to supine: Min assist   General bed mobility comments: Pt required min A for trunk elevation and bringing hips to EOB with bed pad. Min A for LE elevation during sit to supine    Transfers Overall transfer level: Needs assistance Equipment used: Rolling walker (2 wheels) Transfers: Sit to/from Stand Sit to Stand: Mod assist, +2 physical assistance, From elevated surface           General transfer comment: Pt performs 2 STS; 1st with RW and elevated bed height required mod A x2 and cueing for hip extension and trunk posture. 2nd STS with Steady and elevated bed height, required cueing for hip extension and UE placement to assist with acheiving full standing Transfer via Lift Equipment: Stedy  Ambulation/Gait     Assistive device: Rolling walker (2 wheels)       Pre-gait activities: Pt able to march in place x2 Bil. Required cueing to place more body weight into RW during  single limb stance with bil LEs, L>R. Pt places more weight through R LE than L LE. Required min A x2 for stability General Gait Details: Gait deferred this session s/t pain  Stairs            Wheelchair Mobility     Tilt Bed    Modified Rankin (Stroke Patients Only)       Balance Overall balance assessment: Needs  assistance Sitting-balance support: Single extremity supported, No upper extremity supported, Feet supported Sitting balance-Leahy Scale: Good Sitting balance - Comments: Pt prefers to sit EOB with R UE supporting him and requires cueing to place hands in lap. Pt maintains neutral sitting posture with anterior, posterior, and bil lateral therapist overpressure. Postural control: Right lateral lean (when using R UE to support himself) Standing balance support: Bilateral upper extremity supported, During functional activity, Reliant on assistive device for balance Standing balance-Leahy Scale: Poor Standing balance comment: Pt reliant on RW and steady for standing balance. Has increased trunk flexion and L knee flexion without buckling.                             Pertinent Vitals/Pain Pain Assessment Pain Assessment: 0-10 Pain Score: 7  Pain Location: L knee Pain Descriptors / Indicators: Grimacing, Guarding, Moaning Pain Intervention(s): Limited activity within patient's tolerance, Monitored during session    Home Living Family/patient expects to be discharged to:: Private residence Living Arrangements: Spouse/significant other Available Help at Discharge: Family;Available 24 hours/day Type of Home: House Home Access: Stairs to enter     Alternate Level Stairs-Number of Steps: flight. pt uses chair lift to navigate stairs Home Layout: Two level Home Equipment: Rollator (4 wheels);Gilmer Mor - single point Additional Comments: pt lives with wife who completes most of the household chores. Wife isn't able to provide pt lifting assistance. Pt reports 2 falls since January 2025; both have occured in the garage. He is unable to remember how he fell the second time.    Prior Function Prior Level of Function : Driving;History of Falls (last six months);Needs assist       Physical Assist : ADLs (physical);Mobility (physical) Mobility (physical): Stairs;Gait ADLs (physical):  IADLs Mobility Comments: Pt intermittently ambulates with cane or rollator. ADLs Comments: Pt still drives. [name] performs most of the housework.     Extremity/Trunk Assessment   Upper Extremity Assessment Upper Extremity Assessment: Defer to OT evaluation    Lower Extremity Assessment Lower Extremity Assessment: RLE deficits/detail;LLE deficits/detail;Generalized weakness RLE Deficits / Details: Gross MMT 3+/5. Pt lacking hip flexion AROM s/t pain LLE Deficits / Details: Gross MMT 3+/5. Reduced L hip flexion AROM. TTP around distal hamstring and anterolateral aspects of L knee    Cervical / Trunk Assessment Cervical / Trunk Assessment: Normal  Communication   Communication Communication: Impaired Factors Affecting Communication: Hearing impaired    Cognition Arousal: Alert Behavior During Therapy: WFL for tasks assessed/performed   PT - Cognitive impairments: Memory                       PT - Cognition Comments: Pt is unable to remember how he fell the second time in January 2025 Following commands: Intact       Cueing Cueing Techniques: Verbal cues, Visual cues     General Comments      Exercises     Assessment/Plan    PT Assessment Patient needs continued PT services  PT Problem List  Decreased strength;Decreased range of motion;Decreased activity tolerance;Decreased mobility;Decreased balance;Decreased coordination;Decreased knowledge of use of DME;Decreased safety awareness;Decreased knowledge of precautions;Cardiopulmonary status limiting activity;Pain       PT Treatment Interventions DME instruction;Gait training;Functional mobility training;Stair training;Therapeutic activities;Therapeutic exercise;Balance training;Neuromuscular re-education;Patient/family education;Wheelchair mobility training    PT Goals (Current goals can be found in the Care Plan section)  Acute Rehab PT Goals Patient Stated Goal: return to baseline PT Goal Formulation:  With patient/family Time For Goal Achievement: 02/19/24 Potential to Achieve Goals: Good    Frequency Min 1X/week     Co-evaluation               AM-PAC PT "6 Clicks" Mobility  Outcome Measure Help needed turning from your back to your side while in a flat bed without using bedrails?: A Little Help needed moving from lying on your back to sitting on the side of a flat bed without using bedrails?: A Lot Help needed moving to and from a bed to a chair (including a wheelchair)?: Total Help needed standing up from a chair using your arms (e.g., wheelchair or bedside chair)?: Total Help needed to walk in hospital room?: Total Help needed climbing 3-5 steps with a railing? : Total 6 Click Score: 9    End of Session Equipment Utilized During Treatment: Gait belt Activity Tolerance: Patient limited by pain Patient left: in bed;with call bell/phone within reach;with bed alarm set;with family/visitor present Nurse Communication: Mobility status PT Visit Diagnosis: Unsteadiness on feet (R26.81);Other abnormalities of gait and mobility (R26.89);Muscle weakness (generalized) (M62.81);History of falling (Z91.81);Difficulty in walking, not elsewhere classified (R26.2);Pain Pain - Right/Left: Left Pain - part of body: Knee    Time: 1015-1105 PT Time Calculation (min) (ACUTE ONLY): 50 min   Charges:   PT Evaluation $PT Eval Moderate Complexity: 1 Mod PT Treatments $Therapeutic Activity: 23-37 mins PT General Charges $$ ACUTE PT VISIT: 1 Visit       [address], SPT   [place] 02/05/2024, 2:25 PM

## 2024-02-05 NOTE — Plan of Care (Signed)

## 2024-02-05 NOTE — Progress Notes (Signed)
 Echocardiogram 2D Echocardiogram has been performed.  Lucendia Herrlich 02/05/2024, 2:32 PM

## 2024-02-05 NOTE — Progress Notes (Signed)
 OT Cancellation Note  Patient Details Name: Steven Newman MRN: [id number] DOB: January 13, 1941   Cancelled Treatment:    Reason Eval/Treat Not Completed: Patient at procedure or test/ unavailable (Attempted to see pt for skilled OT eval. Per RN, pt awaiting transport for vascular imaging. RN requests OT return at a later time. OT to reattempt to see pt at a later time as appropriate/available.)  Manjit Bufano "Orson Eva., OTR/L, MA Acute Rehab [phone]  Lendon Colonel 02/05/2024, 12:38 PM

## 2024-02-05 NOTE — Care Management Obs Status (Signed)
 MEDICARE OBSERVATION STATUS NOTIFICATION   Patient Details  Name: Steven Newman MRN: [id number] Date of Birth: 09/02/1941   Medicare Observation Status Notification Given:  Yes    Lockie Pares, RN 02/05/2024, 5:02 PM

## 2024-02-05 NOTE — Progress Notes (Signed)
 VASCULAR LAB    ABI has been performed.  See CV proc for preliminary results.   Kimm Sider, RVT 02/05/2024, 3:04 PM

## 2024-02-06 ENCOUNTER — Observation Stay (HOSPITAL_COMMUNITY)

## 2024-02-06 DIAGNOSIS — I5043 Acute on chronic combined systolic (congestive) and diastolic (congestive) heart failure: Secondary | ICD-10-CM | POA: Diagnosis not present

## 2024-02-06 DIAGNOSIS — M47816 Spondylosis without myelopathy or radiculopathy, lumbar region: Secondary | ICD-10-CM | POA: Diagnosis not present

## 2024-02-06 DIAGNOSIS — N1831 Chronic kidney disease, stage 3a: Secondary | ICD-10-CM | POA: Diagnosis not present

## 2024-02-06 DIAGNOSIS — I509 Heart failure, unspecified: Secondary | ICD-10-CM

## 2024-02-06 DIAGNOSIS — M51369 Other intervertebral disc degeneration, lumbar region without mention of lumbar back pain or lower extremity pain: Secondary | ICD-10-CM | POA: Diagnosis not present

## 2024-02-06 DIAGNOSIS — M48061 Spinal stenosis, lumbar region without neurogenic claudication: Secondary | ICD-10-CM | POA: Diagnosis not present

## 2024-02-06 DIAGNOSIS — R262 Difficulty in walking, not elsewhere classified: Secondary | ICD-10-CM

## 2024-02-06 DIAGNOSIS — M47812 Spondylosis without myelopathy or radiculopathy, cervical region: Secondary | ICD-10-CM | POA: Diagnosis not present

## 2024-02-06 DIAGNOSIS — M4802 Spinal stenosis, cervical region: Secondary | ICD-10-CM | POA: Diagnosis not present

## 2024-02-06 DIAGNOSIS — M419 Scoliosis, unspecified: Secondary | ICD-10-CM | POA: Diagnosis not present

## 2024-02-06 DIAGNOSIS — M5127 Other intervertebral disc displacement, lumbosacral region: Secondary | ICD-10-CM | POA: Diagnosis not present

## 2024-02-06 DIAGNOSIS — I739 Peripheral vascular disease, unspecified: Secondary | ICD-10-CM | POA: Diagnosis not present

## 2024-02-06 DIAGNOSIS — M50222 Other cervical disc displacement at C5-C6 level: Secondary | ICD-10-CM | POA: Diagnosis not present

## 2024-02-06 DIAGNOSIS — M50223 Other cervical disc displacement at C6-C7 level: Secondary | ICD-10-CM | POA: Diagnosis not present

## 2024-02-06 DIAGNOSIS — N179 Acute kidney failure, unspecified: Secondary | ICD-10-CM | POA: Diagnosis not present

## 2024-02-06 DIAGNOSIS — I10 Essential (primary) hypertension: Secondary | ICD-10-CM | POA: Diagnosis not present

## 2024-02-06 DIAGNOSIS — M47814 Spondylosis without myelopathy or radiculopathy, thoracic region: Secondary | ICD-10-CM | POA: Diagnosis not present

## 2024-02-06 LAB — BASIC METABOLIC PANEL
Anion gap: 16 — ABNORMAL HIGH (ref 5–15)
BUN: 36 mg/dL — ABNORMAL HIGH (ref 8–23)
CO2: 20 mmol/L — ABNORMAL LOW (ref 22–32)
Calcium: 8.8 mg/dL — ABNORMAL LOW (ref 8.9–10.3)
Chloride: 99 mmol/L (ref 98–111)
Creatinine, Ser: 1.71 mg/dL — ABNORMAL HIGH (ref 0.61–1.24)
GFR, Estimated: 39 mL/min — ABNORMAL LOW (ref >60)
Glucose, Bld: 145 mg/dL — ABNORMAL HIGH (ref 70–99)
Potassium: 4 mmol/L (ref 3.5–5.1)
Sodium: 135 mmol/L (ref 135–145)

## 2024-02-06 LAB — URIC ACID: Uric Acid, Serum: 6.1 mg/dL (ref 3.7–8.6)

## 2024-02-06 LAB — HIV ANTIBODY (ROUTINE TESTING W REFLEX): HIV Screen 4th Generation wRfx: NONREACTIVE

## 2024-02-06 LAB — C-REACTIVE PROTEIN: CRP: 29.1 mg/dL — ABNORMAL HIGH (ref <1.0)

## 2024-02-06 LAB — TSH: TSH: 3.764 u[IU]/mL (ref 0.350–4.500)

## 2024-02-06 LAB — CBC
HCT: 43 % (ref 39.0–52.0)
Hemoglobin: 14.3 g/dL (ref 13.0–17.0)
MCH: 30.3 pg (ref 26.0–34.0)
MCHC: 33.3 g/dL (ref 30.0–36.0)
MCV: 91.1 fL (ref 80.0–100.0)
Platelets: 130 10*3/uL — ABNORMAL LOW (ref 150–400)
RBC: 4.72 MIL/uL (ref 4.22–5.81)
RDW: 15.5 % (ref 11.5–15.5)
WBC: 9.2 10*3/uL (ref 4.0–10.5)
nRBC: 0 % (ref 0.0–0.2)

## 2024-02-06 LAB — SEDIMENTATION RATE: Sed Rate: 85 mm/h — ABNORMAL HIGH (ref 0–16)

## 2024-02-06 LAB — RPR: RPR Ser Ql: NONREACTIVE

## 2024-02-06 LAB — GLUCOSE, CAPILLARY
Glucose-Capillary: 146 mg/dL — ABNORMAL HIGH (ref 70–99)
Glucose-Capillary: 173 mg/dL — ABNORMAL HIGH (ref 70–99)
Glucose-Capillary: 168 mg/dL — ABNORMAL HIGH (ref 70–99)
Glucose-Capillary: 189 mg/dL — ABNORMAL HIGH (ref 70–99)

## 2024-02-06 LAB — FOLATE: Folate: 14.8 ng/mL (ref >5.9)

## 2024-02-06 LAB — VITAMIN B12: Vitamin B-12: 825 pg/mL (ref 180–914)

## 2024-02-06 MED ORDER — METHYLPREDNISOLONE ACETATE 40 MG/ML IJ SUSP
40.0000 mg | Freq: Once | INTRAMUSCULAR | Status: AC
  Administered 2024-02-06: 40 mg via INTRA_ARTICULAR
  Filled 2024-02-06: qty 1

## 2024-02-06 MED ORDER — METOPROLOL SUCCINATE ER 25 MG PO TB24
25.0000 mg | ORAL_TABLET | Freq: Every day | ORAL | Status: DC
  Administered 2024-02-06 – 2024-02-09 (×4): 25 mg via ORAL
  Filled 2024-02-06 (×4): qty 1

## 2024-02-06 MED ORDER — BUPIVACAINE HCL (PF) 0.5 % IJ SOLN
10.0000 mL | Freq: Once | INTRAMUSCULAR | Status: AC
  Administered 2024-02-06: 10 mL
  Filled 2024-02-06: qty 10

## 2024-02-06 MED ORDER — FUROSEMIDE 10 MG/ML IJ SOLN
40.0000 mg | Freq: Once | INTRAMUSCULAR | Status: AC
  Administered 2024-02-06: 40 mg via INTRAVENOUS
  Filled 2024-02-06: qty 4

## 2024-02-06 MED ORDER — EMPAGLIFLOZIN 25 MG PO TABS
25.0000 mg | ORAL_TABLET | Freq: Every day | ORAL | Status: DC
  Administered 2024-02-06 – 2024-02-09 (×4): 25 mg via ORAL
  Filled 2024-02-06 (×4): qty 1

## 2024-02-06 NOTE — Discharge Instructions (Signed)
 Heart Failure Education: Weigh yourself EVERY morning after you go to the bathroom but before you eat or drink anything. Write this number down in a weight log/diary. If you gain 3 pounds overnight or 5 pounds in a week, call the office. Take your medicines as prescribed. If you have concerns about your medications, please call us before you stop taking them.  Eat low salt foods--Limit salt (sodium) to 2000 mg per day. This will help prevent your body from holding onto fluid. Read food labels as many processed foods have a lot of sodium, especially canned goods and prepackaged meats. If you would like some assistance choosing low sodium foods, we would be happy to set you up with a nutritionist. Limit all fluids for the day to less than 2 liters (64 ounces). Fluid includes all drinks, coffee, juice, ice chips, soup, jello, and all other liquids. Stay as active as you can everyday. Staying active will give you more energy and make your muscles stronger. Start with 5 minutes at a time and work your way up to 30 minutes a day. Break up your activities--do some in the morning and some in the afternoon. Start with 3 days per week and work your way up to 5 days as you can.  If you have chest pain, feel short of breath, dizzy, or lightheaded, STOP. If you don't feel better after a short rest, call 911. If you do feel better, call the office to let us know you have symptoms with exercise.

## 2024-02-06 NOTE — NC FL2 (Signed)
 [place] MEDICAID FL2 LEVEL OF CARE FORM     IDENTIFICATION  Patient Name: Steven Newman Birthdate: 1941/03/17 Sex: male Admission Date (Current Location): 02/04/2024  [facility] and [place] Number:  Producer, television/film/video and Address:  [facility], [address], [place], [place] [address]      Provider Number: [id number]  Attending Physician Name and Address:  Osvaldo Shipper, MD  Relative Name and Phone Number:       Current Level of Care: Hospital Recommended Level of Care: Skilled Nursing Facility Prior Approval Number:    Date Approved/Denied:   PASRR Number: [id number]  Discharge Plan: SNF    Current Diagnoses: Patient Active Problem List   Diagnosis Date Noted   Chronic kidney disease, stage 3a (HCC) 02/05/2024   History of abdominal aortic aneurysm (AAA) 02/05/2024   Non-insulin dependent type 2 diabetes mellitus (HCC) 02/05/2024   Acute on chronic combined systolic and diastolic CHF (congestive heart failure) (HCC) 02/05/2024   Generalized weakness 02/05/2024   History of COPD 02/05/2024   BPH (benign prostatic hyperplasia) 02/05/2024   Tinnitus of both ears 10/19/2023   History of artificial lens replacement    Exposure to Agent Orange    History of adenomatous polyp of colon    Myocardial infarction    Microalbuminuria    Peripheral artery disease (HCC)    Thrombocytopenia    Urinary incontinence    Venous insufficiency (chronic) (peripheral)    Vitamin D deficiency    Hypercholesterolemia    History of total shoulder replacement, left 08/07/2021   Abnormal gait 05/30/2021   Essential hypertension 05/30/2021   Sensorineural hearing loss, bilateral 05/30/2021   Chronic kidney disease, stage 3b 05/30/2021   Rotator cuff tear arthropathy 05/14/2021   Bilateral hip bursitis 04/24/2021   Osteoarthritis of left knee 04/24/2021   Osteoarthritis of right knee 04/24/2021   Mild neurocognitive disorder 03/04/2021   Pain in joint of  left shoulder 01/14/2021   Neck pain 03/05/2015   Abdominal aortic aneurysm 11/21/2014   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/09/2013   OSA (obstructive sleep apnea) 08/09/2013   Hyperlipidemia    Atherosclerotic heart disease of native coronary artery without angina pectoris    Obesity    History of skin cancer    Type 2 diabetes mellitus with peripheral angiopathy (HCC)    History of skin cancer    Erectile dysfunction     Orientation RESPIRATION BLADDER Height & Weight     Self, Time, Situation, Place  Normal Continent, External catheter Weight: 195 lb 1.7 oz (88.5 kg) Height:  5' 9.5" (176.5 cm)  BEHAVIORAL SYMPTOMS/MOOD NEUROLOGICAL BOWEL NUTRITION STATUS      Continent Diet (see dc summary)  AMBULATORY STATUS COMMUNICATION OF NEEDS Skin   Limited Assist Verbally Normal                       Personal Care Assistance Level of Assistance  Bathing, Feeding, Dressing Bathing Assistance: Maximum assistance Feeding assistance: Limited assistance Dressing Assistance: Maximum assistance     Functional Limitations Info  Sight, Hearing, Speech Sight Info: Impaired (Reading glasses) Hearing Info: Impaired (Impaired) Speech Info: Adequate    SPECIAL CARE FACTORS FREQUENCY  OT (By licensed OT), PT (By licensed PT)     PT Frequency: 5x week OT Frequency: 5x week            Contractures Contractures Info: Not present    Additional Factors Info  Code Status, Allergies, Insulin  Sliding Scale Code Status Info: Full Allergies Info: Ace Inhibitors, Amoxicillin-pot Clavulanate, Penicillins   Insulin Sliding Scale Info: see dc summary       Current Medications (02/06/2024):  This is the current hospital active medication list Current Facility-Administered Medications  Medication Dose Route Frequency Provider Last Rate Last Admin   acetaminophen (TYLENOL) tablet 650 mg  650 mg Oral Q6H PRN Janalyn Shy, Subrina, MD   650 mg at 02/05/24 1504   Or   acetaminophen (TYLENOL) suppository 650  mg  650 mg Rectal Q6H PRN Janalyn Shy, Subrina, MD       aspirin EC tablet 81 mg  81 mg Oral Daily Sundil, Subrina, MD   81 mg at 02/06/24 0852   cephALEXin (KEFLEX) capsule 500 mg  500 mg Oral TID Osvaldo Shipper, MD   500 mg at 02/06/24 8295   heparin injection 5,000 Units  5,000 Units Subcutaneous Q8H Sundil, Subrina, MD   5,000 Units at 02/06/24 0503   insulin aspart (novoLOG) injection 0-9 Units  0-9 Units Subcutaneous TID WC Osvaldo Shipper, MD   2 Units at 02/06/24 1207   ipratropium-albuterol (DUONEB) 0.5-2.5 (3) MG/3ML nebulizer solution 3 mL  3 mL Nebulization Q4H PRN Janalyn Shy, Subrina, MD       mirabegron ER (MYRBETRIQ) tablet 25 mg  25 mg Oral Daily Sundil, Subrina, MD   25 mg at 02/06/24 0852   ondansetron (ZOFRAN) tablet 4 mg  4 mg Oral Q6H PRN Janalyn Shy, Subrina, MD       Or   ondansetron [facility]) injection 4 mg  4 mg Intravenous Q6H PRN Janalyn Shy, Subrina, MD       rosuvastatin (CRESTOR) tablet 40 mg  40 mg Oral Daily Sundil, Subrina, MD   40 mg at 02/06/24 6213   sodium chloride flush (NS) 0.9 % injection 3 mL  3 mL Intravenous Q12H Sundil, Subrina, MD   3 mL at 02/06/24 0853   sodium chloride flush (NS) 0.9 % injection 3 mL  3 mL Intravenous PRN Sundil, Subrina, MD       triamcinolone cream (KENALOG) 0.1 % cream 1 Application  1 Application Topical BID Osvaldo Shipper, MD   1 Application at 02/06/24 0865   umeclidinium-vilanterol (ANORO ELLIPTA) 62.5-25 MCG/ACT 1 puff  1 puff Inhalation Daily Sundil, Subrina, MD   1 puff at 02/06/24 0820     Discharge Medications: Please see discharge summary for a list of discharge medications.  Relevant Imaging Results:  Relevant Lab Results:   Additional Information SSN [id number] [place]

## 2024-02-06 NOTE — Progress Notes (Signed)
 Inpatient Rehab Admissions Coordinator:   Per therapy recommendations, patient was screened for CIR candidacy by Megan Salon, MS, CCC-SLP. At this time, Pt. Is observation status and is undergoing work up and it is not yet clear that he demonstrates medical necessity to justify in hospital rehabilitation/CIR. I will not pursue a rehab consult for this Pt. At this time but will rescreen when work up is complete. Recommend other rehab venues to be pursued.  Please contact me with any questions.

## 2024-02-06 NOTE — Consult Note (Addendum)
 Cardiology Consultation   Patient ID: MONROE QIN MRN: [id number]; DOB: September 18, 1941  Admit date: 02/04/2024 Date of Consult: 02/06/2024  PCP:  Lorenda Ishihara, MD   [facility] Providers Cardiologist:  Lance Muss, MD        Patient Profile:   Steven Newman is a 83 y.o. 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 Rito Ehrlich.  History of Present Illness:   Steven Newman is a 83 year old male with the above history.  Patient has a significant history of CAD PAD. 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 Last Echo in 09/2021 showed EF of 45-50% with akinesis of the posterior wall and basal anterolateral walls as well as dyskinetic apex consistent with infarction in the left LCx and distal LAD distribution.  He follows with Dr. Kirke Corin for his PAD and has known right proximal popliteal artery stenosis and left common femoral artery stenosis. Last AAA duplex in 01/2023 showed abnormal dilatation of the mid and distal abdominal aorta as well as dilatation of the right common iliac artery and an ectatic left common iliac artery. ABIs at that time were moderately reduced on the right (0.66) and mildly reduced on the left (0.84). Carotid dopplers in 06/2023 showed 40-59% stenosis of right ICA and 60-79% of left ICA.  Patient was last seen by Dr. Kirke Corin in 06/2023 at which time he reported occasional discomfort in left leg with walking but not the right leg. This was not felt to be due to PAD. He denied any chest pain or shortness of breath at that time.  Patient presented to the ED on 02/04/2024  due to significant leg weakness and difficult walking. Work-up showed: WBC 8.4, Hgb 14.9, Plts 152. Na 135, K 4.8, Glucose 154, BUN 37, Cr 1.66. Albumin 3.4, AST 35, AL 46, Alk Phos 56, Total Bili 0.6. High-sensitivity troponin negative x2. BNP 896. Urinalysis showed glucose >/= 500 and protein 100. 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 X-ray showed moderate right and small left knee joint effusions and moderate bilateral hip osteoarthrosis. Spinal MRI showed multilevel cervical spondylosis with associated moderate to severe bilateral C4-C7 and multilevel lumbar spondylosis with mild to moderate bilateral neuroforaminal stenosis at L3-L4 and L4-L5. He was given a dose of IV Lasix in the ED.   Vascular Surgery was consulted and patient was seen by Dr. Myra Gianotti. Leg weakness was not felt to be secondary to his PAD. However, since patient is followed by Dr. Kirke Corin, we were asked to take over today.  At the time  of this evaluation, patient is resting comfortably no acute distress.  Patient has a lot of orthopedic issues including chronic knee and hip pain.  Patient is wife reports that he has been a little unsteady on his feet for the past couple weeks but states is acutely worsened on Friday, 02/03/2024.  By Saturday, 02/04/2024, he was having significant lower extremity weakness and knee pain that made it difficult for him to  walk.  This is when patient decided come to the ED.  He denies any calf or thigh pain that with ambulation that sounds like claudication.  He denies any chest pain.  He does have chronic dyspnea (mainly with exertion but wife states he will sometimes have this at rest) which she attributes to COPD.  Sounds like this may have been a little worse lately.  No orthopnea or PND.  He does describe some recent lower extremity edema but this is improved with the IV Lasix here in the hospital.  He reports occasional palpitations that he describes as a "skipped beat" but no sustained/prolonged palpitations.  No lightheadedness/dizziness.  Has had a couple mechanical falls over the last couple months due to leg weakness/unsteadiness.  He has had some nasal congestion but no recent fevers or cough.  He did have a rash last week on his head and was prescribed Keflex for this.  He also reports a little unintentional weight loss recently.  He denies any abnormal bleeding in urine or stools.   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    Myocardial infarction    Neck pain 03/05/2015   Obesity    OSA (obstructive sleep apnea) 08/09/2013    October 2014:AHI of 14 , RDI 33/h and desaturation to 88%    Osteoarthritis of left knee 04/24/2021   Osteoarthritis of right knee 04/24/2021   Pain in joint of left shoulder 01/14/2021   Peripheral arterial disease    Pneumonia    Rotator cuff tear arthropathy 05/14/2021   Skin cancer    Thrombocytopenia    Type 2 diabetes mellitus with peripheral angiopathy    Urinary incontinence    Venous insufficiency (chronic) (peripheral)    Vitamin D deficiency    Wheezing     Past Surgical History:  Procedure Laterality Date   bladder cancer surgery     CARDIAC CATHETERIZATION     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 Authorizing Provider  acetaminophen (TYLENOL) 325 MG tablet Take 650 mg by mouth every 6 (six) hours as needed for mild pain (pain score 1-3) or moderate pain (pain score 4-6).   Yes [provider]  albuterol (PROVENTIL) (2.5 MG/3ML) 0.083% nebulizer solution Take 2.5 mg by nebulization every 6 (six) hours as needed for wheezing or shortness of breath.   Yes [provider]  ASPIRIN LOW DOSE 81 MG tablet TAKE 1 TABLET DAILY 08/30/23  Yes Corky Crafts, MD  Boswellia-Glucosamine-Vit D (OSTEO BI-FLEX ONE PER DAY PO) Take 2 tablets by mouth daily.   Yes [provider]  cephALEXin (KEFLEX) 500 MG capsule Take 500 mg by mouth 3 (three) times daily. 02/01/24  Yes [provider]  cetirizine (ZYRTEC) 10 MG tablet Take 10 mg by mouth daily.   Yes [provider]  Cholecalciferol (VITAMIN D-3) 125 MCG (5000 UT) TABS Take 5,000  Units by mouth daily.   Yes [provider]  Coenzyme Q10 (CO Q-10 PO) Take 10 mg by mouth daily.   Yes [provider]  empagliflozin (JARDIANCE) 25 MG TABS tablet Take 25 mg by mouth daily. 08/15/23  Yes [provider]  mirabegron ER (MYRBETRIQ) 50 MG TB24 tablet Take 50 mg by mouth daily. 08/17/23  Yes [provider]  Multiple Vitamins-Minerals (AIRBORNE) PACK Take 1 tablet by mouth 2 (two) times daily.   Yes [provider]  nadolol (CORGARD) 40 MG tablet Take 1 tablet (40 mg total) by mouth daily. 12/12/17  Yes Robbie Lis M, PA-C  nitroGLYCERIN (NITROSTAT) 0.4 MG SL tablet Place 1 tablet (0.4 mg total) under the tongue every 5 (five) minutes as needed for chest pain. 02/01/22  Yes Corky Crafts, MD  rosuvastatin (CRESTOR) 40 MG tablet Take 1 tablet (40 mg total) by mouth daily. 07/07/23 02/06/24 Yes Iran Ouch, MD  Tiotropium Bromide-Olodaterol 2.5-2.5 MCG/ACT AERS Take 2 puffs by mouth daily. 08/17/23  Yes [provider]  triamcinolone cream (KENALOG) 0.1 % Apply 1 Application topically 2 (two) times daily as needed (Rash).   Yes [provider]  TURMERIC PO Take 1,350 mg by mouth daily.   Yes [provider]  vitamin B-12 (CYANOCOBALAMIN) 1000 MCG tablet Take 1,000 mcg by mouth daily.   Yes [provider]  umeclidinium-vilanterol (ANORO ELLIPTA) 62.5-25 MCG/ACT AEPB Inhale 1 puff into the lungs daily. Patient not taking: Reported on 02/06/2024 11/03/21   Chilton Greathouse, MD    Inpatient Medications: Scheduled Meds:  aspirin EC  81 mg Oral Daily   bupivacaine(PF)  10 mL Infiltration Once   cephALEXin  500 mg Oral TID   heparin  5,000 Units Subcutaneous Q8H   insulin aspart  0-9 Units Subcutaneous TID WC   methylPREDNISolone acetate  40 mg Intra-articular Once   mirabegron ER  25 mg Oral Daily   rosuvastatin  40 mg Oral Daily   sodium chloride flush  3 mL Intravenous Q12H   triamcinolone  cream  1 Application Topical BID   umeclidinium-vilanterol  1 puff Inhalation Daily   Continuous Infusions:  PRN Meds: acetaminophen **OR** acetaminophen, ipratropium-albuterol, ondansetron **OR** ondansetron (ZOFRAN) IV, sodium chloride flush  Allergies:    Allergies  Allergen Reactions   Ace Inhibitors Other (See Comments)    Angioedema   Amoxicillin-Pot Clavulanate Hives  Penicillins Swelling and Rash    Lips swell    Social History:   Social History   Socioeconomic History   Marital status: Married    Spouse name: Not on file   Number of children: Not on file   Years of education: Not on file   Highest education level: Not on file  Occupational History   Occupation: Retired  Tobacco Use   Smoking status: Former    Current packs/day: 0.00    Average packs/day: 2.0 packs/day for 50.0 years (100.0 ttl pk-yrs)    Types: Cigarettes    Start date: 06/19/1965    Quit date: 06/20/2015    Years since quitting: 8.6   Smokeless tobacco: Never  Vaping Use   Vaping status: Never Used  Substance and Sexual Activity   Alcohol use: Yes    Comment: 1- 2 beer a week and 1 mix drink   Drug use: No   Sexual activity: Not on file  Other Topics Concern   Not on file  Social History Narrative   Right handed   Drinks caffeine   Two story home   Social Drivers of Health   Financial Resource Strain: Not on file  Food Insecurity: Unknown (02/05/2024)   Hunger Vital Sign    Worried About Running Out of Food in the Last Year: Never true    Ran Out of Food in the Last Year: Not on file  Transportation Needs: No Transportation Needs (02/05/2024)   PRAPARE - Administrator, Civil Service (Medical): No    Lack of Transportation (Non-Medical): No  Physical Activity: Not on file  Stress: Not on file  Social Connections: Moderately Isolated (02/05/2024)   Social Connection and Isolation Panel [NHANES]    Frequency of Communication with Friends and Family: Once a week     Frequency of Social Gatherings with Friends and Family: Once a week    Attends Religious Services: More than 4 times per year    Active Member of [employer] or Organizations: No    Attends Banker Meetings: Never    Marital Status: Married  Catering manager Violence: Not At Risk (02/05/2024)   Humiliation, Afraid, Rape, and Kick questionnaire    Fear of Current or Ex-Partner: No    Emotionally Abused: No    Physically Abused: No    Sexually Abused: No    Family History:   Family History  Problem Relation Age of Onset   Heart disease Mother    Rheumatic fever Father    Rheumatic fever Brother    Diabetes Sister    Cancer Sister        cancer on eyelid     ROS:  Please see the history of present illness.  Review of Systems  Constitutional:  Negative for fever.  HENT:  Positive for congestion.   Respiratory:  Positive for shortness of breath. Negative for cough.   Cardiovascular:  Positive for palpitations and leg swelling. Negative for chest pain, orthopnea and PND.  Gastrointestinal:  Negative for blood in stool, melena, nausea and vomiting.  Genitourinary:  Negative for hematuria.  Musculoskeletal:  Positive for falls and joint pain.  Skin:  Positive for rash.  Neurological:  Positive for weakness. Negative for dizziness and loss of consciousness.  Endo/Heme/Allergies:  Does not bruise/bleed easily.  Psychiatric/Behavioral:  Substance abuse: prior tobacco use.    Physical Exam/Data:   Vitals:   02/06/24 0505 02/06/24 0735 02/06/24 0820 02/06/24 0851  BP: 134/74 (!) 142/66  115/61  Pulse: 68 67  81  Resp: 20 (!) 21  12  Temp: 97.9 F (36.6 C) 97.9 F (36.6 C)    TempSrc: Oral Oral    SpO2: 96% 96% 96%   Weight:      Height:        Intake/Output Summary (Last 24 hours) at 02/06/2024 1348 Last data filed at 02/06/2024 0900 Gross per 24 hour  Intake 240 ml  Output 2350 ml  Net -2110 ml      02/06/2024    5:00 AM 02/05/2024    1:42 AM 02/04/2024    2:20 PM   Last 3 Weights  Weight (lbs) 195 lb 1.7 oz 197 lb 5 oz 207 lb 0.2 oz  Weight (kg) 88.5 kg 89.5 kg 93.9 kg     Body mass index is 28.4 kg/m.  General: 83 y.o. [place] male resting comfortably in no acute distress. HEENT: Normocephalic and atraumatic. Sclera clear.  Neck: Supple. No JVD. Heart: RRR.  No murmurs, gallops, or rubs. Radial pulses 2+ and equal bilaterally. Lungs: No increased work of breathing. Scattered expiratory wheezes noted. No rhonchi or rales.  Abdomen: Soft, non-distended, and non-tender to palpation.  Extremities: No lower extremity edema.    Skin: Warm and dry. Neuro: Alert and oriented x3. No focal deficits. Psych: Normal affect. Responds appropriately.   EKG:  The EKG was personally reviewed and demonstrates:  Normal sinus rhythm, rate 79 bpm, with ST elevation in lead III and aVF and ST depressions which have been seen on prior tracings but are more pronounced.  Telemetry:  Telemetry was personally reviewed and demonstrates:  Normal sinus rhythm with rates in the 70s to 80s.  Relevant CV Studies:  Chest/ Abdominal/ Pelvic CTA 02/04/2024: Impression: 1. No aortic dissection or intramural hematoma. 2. 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 Recommend follow-up ultrasound every 3 years. 3. 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 4. 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 Moderate narrowing at the origin of the right main renal artery. 5. 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 Visualized segments of the left profunda femoris artery are occluded due to dense calcified plaque at the origin. 6. Severe stenosis of the proximal left SFA due to densely calcified plaque. 7. 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 8. Extensive descending and sigmoid colon diverticulosis without evidence of acute diverticulitis. 9. Moderate prostatomegaly. _______________  Echocardiogram 02/05/2024: Impressions: 1. Technically difficult study, consider limited echo with contrast to  better evaluate LV systolic function. Left ventricular ejection fraction,  by estimation, is 35 to 40%. The left ventricle has moderately decreased  function. The left ventricle  demonstrates regional wall motion abnormalities (see scoring  diagram/findings for description). There is moderate asymmetric left  ventricular hypertrophy of the basal-septal segment. Left ventricular  diastolic parameters are consistent with Grade I  diastolic dysfunction (impaired relaxation).   2. Right ventricular systolic function is normal. The right ventricular  size is not well visualized. Tricuspid regurgitation signal is inadequate  for assessing PA pressure.   3. The mitral valve is normal in structure. Trivial mitral valve  regurgitation.   4. The aortic valve was not well visualized. Aortic valve regurgitation  is trivial. No aortic stenosis is present.   5.  Aortic dilatation noted. There is dilatation of the aortic root,  measuring 42 mm. There is dilatation of the ascending aorta, measuring 40  mm.   6. The inferior vena cava is normal in size with greater than 50%  respiratory variability, suggesting right atrial pressure of 3 mmHg.  _______________  ABIs 02/05/2024: Summary: Right: Resting right ankle-brachial index indicates moderate right lower  extremity arterial disease. The right toe-brachial index is abnormal.   Left: Resting left ankle-brachial index indicates moderate left lower  extremity arterial disease. The left toe-brachial index is abnormal.   Laboratory Data:  High Sensitivity Troponin:   Recent Labs  Lab 02/04/24 1423 02/04/24 1730  TROPONINIHS 15 16     Chemistry Recent Labs   Lab 02/04/24 1423 02/05/24 0237 02/06/24 0304  NA 135 138 135  K 4.8 3.9 4.0  CL 105 105 99  CO2 22 15* 20*  GLUCOSE 154* 145* 145*  BUN 37* 32* 36*  CREATININE 1.66* 1.56* 1.71*  CALCIUM 8.9 9.0 8.8*  GFRNONAA 40* 44* 39*  ANIONGAP 8 18* 16*    Recent Labs  Lab 02/04/24 1423 02/05/24 0237  PROT 7.8 6.9  ALBUMIN 3.4* 2.8*  AST 35 21  ALT 46* 36  ALKPHOS 56 43  BILITOT 0.6 1.2   Lipids  Recent Labs  Lab 02/05/24 0237  CHOL 113  TRIG 93  HDL 51  LDLCALC 43  CHOLHDL 2.2    Hematology Recent Labs  Lab 02/04/24 1423 02/05/24 0237 02/06/24 0304  WBC 8.4 9.6 9.2  RBC 4.93 4.93 4.72  HGB 14.9 14.8 14.3  HCT 46.2 46.6 43.0  MCV 93.7 94.5 91.1  MCH 30.2 30.0 30.3  MCHC 32.3 31.8 33.3  RDW 15.6* 15.5 15.5  PLT 152 138* 130*   Thyroid  Recent Labs  Lab 02/06/24 0304  TSH 3.764    BNP Recent Labs  Lab 02/04/24 1423  BNP 896.6*    DDimer No results for input(s): "DDIMER" in the last 168 hours.   Radiology/Studies:  MR THORACIC SPINE WO CONTRAST Result Date: 02/06/2024 CLINICAL DATA:  Initial evaluation for ataxia, thoracic pathology suspected. EXAM: MRI THORACIC SPINE WITHOUT CONTRAST TECHNIQUE: Multiplanar, multisequence MR imaging of the thoracic spine was performed. No intravenous contrast was administered. COMPARISON:  None Available. FINDINGS: Alignment: Mild dextroscoliosis. Alignment otherwise normal with preservation of the normal thoracic kyphosis. No listhesis. Vertebrae: Vertebral body height maintained without acute or chronic fracture. Bone marrow signal intensity within normal limits. No worrisome osseous lesions or abnormal marrow edema. Cord:  Normal signal and morphology. Paraspinal and other soft tissues: Paraspinous soft tissues demonstrate no acute finding. Multiple T2 hyperintense renal cysts noted, benign in appearance, no follow-up imaging recommended. Disc levels: Ordinary for age multilevel disc desiccation noted throughout the thoracic  spine. Mild degenerative endplate spurring noted at T8-9 through T12-L1. Mild multilevel facet hypertrophy. No significant spinal stenosis. Foramina remain patent. No overt neural impingement. IMPRESSION: 1. Normal MRI appearance of the thoracic spinal cord. No cord signal changes to suggest myelopathy. 2. Mild for age thoracic spondylosis without significant stenosis or overt neural impingement. Electronically Signed   By: Rise Mu M.D.   On: 02/06/2024 02:35   MR CERVICAL SPINE WO CONTRAST Result Date: 02/06/2024 CLINICAL DATA:  Initial evaluation for ataxia, cervical pathology suspected. EXAM: MRI CERVICAL SPINE WITHOUT CONTRAST TECHNIQUE: Multiplanar, multisequence MR imaging of the cervical spine was performed. No intravenous contrast was administered. COMPARISON:  None Available. FINDINGS: Alignment: Examination degraded by motion artifact. Straightening of  the normal cervical lordosis.  No listhesis. Vertebrae: Vertebral body height maintained without acute or chronic fracture. Bone marrow signal intensity within normal limits. No worrisome osseous lesions. No abnormal marrow edema. Cord: Normal signal and morphology. Posterior Fossa, vertebral arteries, paraspinal tissues: Unremarkable. Disc levels: C2-C3: Mild left-sided uncovertebral spurring without significant disc bulge. Left-sided facet degeneration with bony ankylosis. No spinal stenosis. Mild left C3 foraminal narrowing. Right neural foramen remains patent. C3-C4: Degenerative vertebral disc space narrowing with diffuse disc osteophyte complex. Posterior component flattens and partially faces the ventral thecal sac. Mild to moderate left greater than right facet hypertrophy. No significant spinal stenosis. Severe left with moderate right C4 foraminal narrowing. C4-C5: Mild right-sided uncovertebral spurring without significant disc bulge. Moderate right-sided facet arthrosis. No spinal stenosis. Severe right C5 foraminal narrowing. Left  neural foramen remains patent. C5-C6: Minimal disc bulge with uncovertebral spurring. Moderate left with mild right facet hypertrophy. No spinal stenosis. Severe left with moderate right C6 foraminal narrowing. C6-C7: Mild disc bulge with uncovertebral spurring. Mild bilateral facet hypertrophy. No significant spinal stenosis. Mild to moderate bilateral C7 foraminal narrowing, slightly worse on the right. C7-T1: Negative interspace. Mild facet hypertrophy. No canal or foraminal stenosis. IMPRESSION: 1. Normal MRI appearance of the cervical spinal cord. No cord signal changes to suggest myelopathy or significant spinal stenosis. 2. Multilevel cervical spondylosis with associated moderate to severe bilateral C4 through C7 foraminal narrowing as above. 3. Moderate multilevel facet hypertrophy as above. Findings could contribute to underlying neck pain. Electronically Signed   By: Rise Mu M.D.   On: 02/06/2024 02:29   VAS [place] ABI WITH/WO TBI Result Date: 02/05/2024  LOWER EXTREMITY DOPPLER STUDY Patient Name:  Steven Newman  Date of Exam:   02/05/2024 Medical Rec #: [id number]     Accession #:    [id number] Date of Birth: 1941/11/21     Patient Gender: M Patient Age:   29 years Exam Location:  [facility] Procedure:      VAS [place] ABI WITH/WO TBI Referring Phys: Tereasa Coop --------------------------------------------------------------------------------  Indications: Peripheral artery disease. (patient followed by Dr. Kennith Maes in the              past) CTA 02/04/2024, indicated severe stenosis of the proximal SMA,              near occlusion at the origin of the left main renal artery, near              occlusion of proximal left femoral artery and the left profunda              artery, severe stenosis of the proximal left SFA, distal right CIA              aneurysm, moderate to severe stenosis at the origin of the right              internal iliac artery, narrowing of the right external iliac               artery, possible nonflow limiting focal dissection of the right              CFA. High Risk Factors: Hypertension, hyperlipidemia, Diabetes, prior MI, coronary                    artery disease. Other Factors: CKD IIIa, AAA, COPD, CHF.  Vascular Interventions: No vascular interventions. Comparison Study: Prior ABI done 02/01/2023 at [facility] Performing Technologist: Sherren Kerns RVS  Examination Guidelines: A complete evaluation includes at minimum, Doppler waveform signals and systolic blood pressure reading at the level of bilateral brachial, anterior tibial, and posterior tibial arteries, when vessel segments are accessible. Bilateral testing is considered an integral part of a complete examination. Photoelectric Plethysmograph (PPG) waveforms and toe systolic pressure readings are included as required and additional duplex testing as needed. Limited examinations for reoccurring indications may be performed as noted.  ABI Findings: +---------+------------------+-----+---------+--------+ Right    Rt Pressure (mmHg)IndexWaveform Comment  +---------+------------------+-----+---------+--------+ Brachial 124                    triphasic         +---------+------------------+-----+---------+--------+ PTA      86                0.69 biphasic          +---------+------------------+-----+---------+--------+ DP       86                0.69 biphasic          +---------+------------------+-----+---------+--------+ Great Toe65                0.52 Abnormal          +---------+------------------+-----+---------+--------+ +---------+------------------+-----+---------+-------+ Left     Lt Pressure (mmHg)IndexWaveform Comment +---------+------------------+-----+---------+-------+ Brachial 119                    triphasic        +---------+------------------+-----+---------+-------+ PTA      85                0.69 biphasic          +---------+------------------+-----+---------+-------+ DP       80                0.65 biphasic         +---------+------------------+-----+---------+-------+ Great Toe90                0.73 Abnormal         +---------+------------------+-----+---------+-------+ +-------+-----------+-----------+------------+------------+ ABI/TBIToday's ABIToday's TBIPrevious ABIPrevious TBI +-------+-----------+-----------+------------+------------+ Right  0.69       0.52       0.66        0.46         +-------+-----------+-----------+------------+------------+ Left   0.69       0.73       0.84        0.67         +-------+-----------+-----------+------------+------------+ Right ABIs and bilateral TBIs appear essentially unchanged compared to prior study on 02/01/2023. Left ABIs appear decreased compared to prior study on 02/01/23.  Summary: Right: Resting right ankle-brachial index indicates moderate right lower extremity arterial disease. The right toe-brachial index is abnormal. Left: Resting left ankle-brachial index indicates moderate left lower extremity arterial disease. The left toe-brachial index is abnormal. *See table(s) above for measurements and observations.  Electronically signed by Coral Else MD on 02/05/2024 at 9:27:15 PM.    Final    ECHOCARDIOGRAM COMPLETE Result Date: 02/05/2024    ECHOCARDIOGRAM REPORT   Patient Name:   Steven Newman Date of Exam: 02/05/2024 Medical Rec #:  [id number]    Height:       69.5 in Accession #:    [id number]   Weight:       197.3 lb Date of Birth:  04/09/1941    BSA:          2.064 m Patient Age:    56  years     BP:           116/72 mmHg Patient Gender: M            HR:           80 bpm. Exam Location:  Inpatient Procedure: 2D Echo, Cardiac Doppler and Color Doppler (Both Spectral and Color            Flow Doppler were utilized during procedure). Indications:     CHF-Acute Diastolic I50.31, CHF- Acute Systolic I50.21  History:         Patient has prior  history of Echocardiogram examinations, most                  recent 09/11/2021. CHF, Previous Myocardial Infarction, PAD,                  COPD and CKD, stage 3; Risk Factors:Hypertension, Diabetes,                  Dyslipidemia and Sleep Apnea.  Sonographer:     Lucendia Herrlich RCS Referring Phys:  [id number] SUBRINA SUNDIL Diagnosing Phys: Epifanio Lesches MD IMPRESSIONS  1. Technically difficult study, consider limited echo with contrast to better evaluate LV systolic function. Left ventricular ejection fraction, by estimation, is 35 to 40%. The left ventricle has moderately decreased function. The left ventricle demonstrates regional wall motion abnormalities (see scoring diagram/findings for description). There is moderate asymmetric left ventricular hypertrophy of the basal-septal segment. Left ventricular diastolic parameters are consistent with Grade I diastolic dysfunction (impaired relaxation).  2. Right ventricular systolic function is normal. The right ventricular size is not well visualized. Tricuspid regurgitation signal is inadequate for assessing PA pressure.  3. The mitral valve is normal in structure. Trivial mitral valve regurgitation.  4. The aortic valve was not well visualized. Aortic valve regurgitation is trivial. No aortic stenosis is present.  5. Aortic dilatation noted. 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  6. The inferior vena cava is normal in size with greater than 50% respiratory variability, suggesting right atrial pressure of 3 mmHg. FINDINGS  Left Ventricle: Left ventricular ejection fraction, by estimation, is 35 to 40%. The left ventricle has moderately decreased function. The left ventricle demonstrates regional wall motion abnormalities. Strain imaging was not performed. The left ventricular internal cavity size was normal in size. There is moderate asymmetric left ventricular hypertrophy of the  basal-septal segment. Left ventricular diastolic parameters are consistent with Grade I diastolic dysfunction (impaired relaxation).  LV Wall Scoring: The entire inferior wall, posterior wall, basal inferoseptal segment, and apex are akinetic. The entire anterior wall, antero-lateral wall, entire anterior septum, apical lateral segment, and mid inferoseptal segment are normal. Right Ventricle: The right ventricular size is not well visualized. No increase in right ventricular wall thickness. Right ventricular systolic function is normal. Tricuspid regurgitation signal is inadequate for assessing PA pressure. Left Atrium: Left atrial size was normal in size. Right Atrium: Right atrial size was normal in size. Pericardium: Trivial pericardial effusion is present. Presence of epicardial fat layer. Mitral Valve: The mitral valve is normal in structure. Trivial mitral valve regurgitation. Tricuspid Valve: The tricuspid valve is normal in structure. Tricuspid valve regurgitation is trivial. Aortic Valve: The aortic valve was not well visualized. Aortic valve regurgitation is trivial. No aortic stenosis is present. Aortic valve peak gradient measures 3.8 mmHg. Pulmonic Valve: The pulmonic valve was not  well visualized. Pulmonic valve regurgitation is not visualized. Aorta: Aortic dilatation noted. 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 Venous: The inferior vena cava is normal in size with greater than 50% respiratory variability, suggesting right atrial pressure of 3 mmHg. IAS/Shunts: The interatrial septum was not well visualized. Additional Comments: 3D imaging was not performed.  LEFT VENTRICLE PLAX 2D LVIDd:         4.80 cm      Diastology LVIDs:         4.00 cm      LV e' medial:    3.48 cm/s LV PW:         1.30 cm      LV E/e' medial:  11.2 LV IVS:        0.90 cm      LV e' lateral:   6.96 cm/s LVOT diam:     2.30 cm      LV E/e' lateral:  5.6 LV SV:         51 LV SV Index:   25 LVOT Area:     4.15 cm  LV Volumes (MOD) LV vol d, MOD A2C: 129.0 ml LV vol d, MOD A4C: 160.0 ml LV vol s, MOD A2C: 88.3 ml LV vol s, MOD A4C: 107.0 ml LV SV MOD A2C:     40.7 ml LV SV MOD A4C:     160.0 ml LV SV MOD BP:      46.0 ml RIGHT VENTRICLE             IVC RV S prime:     12.60 cm/s  IVC diam: 1.00 cm LEFT ATRIUM             Index        RIGHT ATRIUM           Index LA diam:        3.90 cm 1.89 cm/m   RA Area:     13.70 cm LA Vol (A2C):   32.4 ml 15.67 ml/m  RA Volume:   31.30 ml  15.16 ml/m LA Vol (A4C):   33.5 ml 16.23 ml/m LA Biplane Vol: 33.6 ml 16.28 ml/m  AORTIC VALVE AV Area (Vmax): 3.77 cm AV Vmax:        97.10 cm/s AV Peak Grad:   3.8 mmHg LVOT Vmax:      88.02 cm/s LVOT Vmean:     53.275 cm/s LVOT VTI:       0.123 m  AORTA Ao Root diam: 4.20 cm Ao Asc diam:  4.00 cm MITRAL VALVE MV Area (PHT): 3.65 cm    SHUNTS MV Decel Time: 208 msec    Systemic VTI:  0.12 m MV E velocity: 39.00 cm/s  Systemic Diam: 2.30 cm MV A velocity: 68.50 cm/s MV E/A ratio:  0.57 Epifanio Lesches MD Electronically signed by Epifanio Lesches MD Signature Date/Time: 02/05/2024/6:36:38 PM    Final (Updated)    DG Knee Left Port Result Date: 02/05/2024 CLINICAL DATA:  Pain EXAM: PORTABLE PELVIS 1-2 VIEWS; PORTABLE LEFT KNEE - 1-2 VIEW; PORTABLE RIGHT KNEE - 1-2 VIEW COMPARISON:  CT angiography chest abdomen pelvis 02/04/2024 FINDINGS: Pelvis: No fracture or dislocation. Extensive Atherosclerotic changes seen throughout visualized arterial segments. Moderate bilateral hip osteoarthrosis. Right knee: No fracture or dislocation. 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 Moderate knee joint effusion. 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 No  fracture or dislocation. Calcific densities in the distal femur favored to be a benign chondroid lesion. Small knee joint effusion. IMPRESSION: 1. No acute fracture or dislocation of the  pelvis or knees. 2. Moderate right and small left knee joint effusions. 3. Moderate bilateral hip osteoarthrosis. Electronically Signed   By: Acquanetta Belling M.D.   On: 02/05/2024 17:42   DG Knee Right Port Result Date: 02/05/2024 CLINICAL DATA:  Pain EXAM: PORTABLE PELVIS 1-2 VIEWS; PORTABLE LEFT KNEE - 1-2 VIEW; PORTABLE RIGHT KNEE - 1-2 VIEW COMPARISON:  CT angiography chest abdomen pelvis 02/04/2024 FINDINGS: Pelvis: No fracture or dislocation. Extensive Atherosclerotic changes seen throughout visualized arterial segments. Moderate bilateral hip osteoarthrosis. Right knee: No fracture or dislocation. 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 Moderate knee joint effusion. 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 No fracture or dislocation. Calcific densities in the distal femur favored to be a benign chondroid lesion. Small knee joint effusion. IMPRESSION: 1. No acute fracture or dislocation of the pelvis or knees. 2. Moderate right and small left knee joint effusions. 3. Moderate bilateral hip osteoarthrosis. Electronically Signed   By: Acquanetta Belling M.D.   On: 02/05/2024 17:42   DG Pelvis Portable Result Date: 02/05/2024 CLINICAL DATA:  Pain EXAM: PORTABLE PELVIS 1-2 VIEWS; PORTABLE LEFT KNEE - 1-2 VIEW; PORTABLE RIGHT KNEE - 1-2 VIEW COMPARISON:  CT angiography chest abdomen pelvis 02/04/2024 FINDINGS: Pelvis: No fracture or dislocation. Extensive Atherosclerotic changes seen throughout visualized arterial segments. Moderate bilateral hip osteoarthrosis. Right knee: No fracture or dislocation. 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 Moderate knee joint effusion. 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 No fracture or dislocation. Calcific densities in the distal femur favored to be a benign chondroid lesion. Small knee joint effusion. IMPRESSION: 1. No acute fracture or dislocation of the pelvis or knees. 2. Moderate  right and small left knee joint effusions. 3. Moderate bilateral hip osteoarthrosis. Electronically Signed   By: Acquanetta Belling M.D.   On: 02/05/2024 17:42   CT Angio Chest/Abd/Pel for Dissection W and/or Wo Contrast Result Date: 02/04/2024 CLINICAL DATA:  Acute aortic syndrome suspected. Weakness. Tachypnea. Chills. EXAM: CT ANGIOGRAPHY CHEST, ABDOMEN AND PELVIS TECHNIQUE: Non-contrast CT of the chest was initially obtained. Multidetector CT imaging through the chest, abdomen and pelvis was performed using the standard protocol during bolus administration of intravenous contrast. Multiplanar reconstructed images and MIPs were obtained and reviewed to evaluate the vascular anatomy. RADIATION DOSE REDUCTION: This exam was performed according to the departmental dose-optimization program which includes automated exposure control, adjustment of the mA and/or kV according to patient size and/or use of iterative reconstruction technique. CONTRAST:  80mL OMNIPAQUE IOHEXOL 350 MG/ML SOLN COMPARISON:  Chest two-view 02/04/2024 FINDINGS: CTA CHEST FINDINGS Cardiovascular: Heart size within normal limits. Severe triple vessel coronary artery calcifications. No pulmonary artery embolism. No aortic dissection or intramural hematoma. Mediastinum/Nodes: No enlarged mediastinal, hilar, or axillary lymph nodes. Thyroid gland, trachea, and esophagus demonstrate no significant findings. Lungs/Pleura: No focal airspace opacity to indicate pneumonia. Scattered tiny calcified pulmonary nodules consistent with prior granulomatous inflammation. Musculoskeletal: Numerous old healed bilateral rib fractures are seen. No acute osseous abnormality. Review of the MIP images confirms the above findings. CTA ABDOMEN AND PELVIS FINDINGS VASCULAR Aorta: No aortic dissection. Calcified and noncalcified atheromatous plaque seen throughout the abdominal aorta with mild aneurysm of the infrarenal segment measuring up to 3.1 x 3.0 cm. Celiac: Patent  without evidence of aneurysm, dissection, vasculitis or significant stenosis. 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 Moderate narrowing at the origin of the right main renal artery. IMA: Near complete occlusion at the origin of the IMA, which is otherwise patent. Right lower extremity arteries: Mild narrowing of the common iliac artery origin due to calcified atheromatous plaque. 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 Moderate to severe stenosis at the origin of the right internal iliac artery which is diffusely calcified but otherwise patent. Mild diffuse narrowing of the right external iliac artery due to calcified atheromatous plaque. Irregular appearance of the right common femoral artery with a possible nonflow limiting focal dissection. Stranding anterior to the right common femoral artery likely related to prior surgical intervention. No high-grade stenosis of the visualized proximal profunda femoris are superficial femoral arteries. Left lower extremity arteries: Diffuse calcified and noncalcified atheromatous plaque of the left common iliac artery without flow-limiting stenosis. Proximal left internal iliac artery is occluded. Opacification of distal branches consistent with retrograde collateral flow. Mild diffuse narrowing of the external iliac artery due to diffusely calcified plaque. 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 Moderate stenosis of the distal left common femoral artery due to densely calcified plaque. Visualized segments of the left profunda femoris artery are occluded due to dense calcified plaque at the origin. Severe stenosis of the proximal left SFA due to densely calcified  plaque. Veins: No obvious venous abnormality within the limitations of this arterial phase study. Review of the MIP images confirms the above findings. NON-VASCULAR Hepatobiliary: Mild pneumobilia likely related to prior sphincterotomy. Please correlate with patient history. Liver, gallbladder, and bile ducts are otherwise normal. Pancreas: Diffuse fatty atrophy of the pancreas which is otherwise unremarkable. Spleen: Normal in size without focal abnormality. Adrenals/Urinary Tract: Adrenal glands are normal. Subcentimeter renal hypodensities are too small to fully characterize, however most likely represent simple cysts which do not require dedicated imaging follow-up. Multiple additional simple cysts are also seen bilaterally with the largest located in the left kidney measuring 4.2 cm. These simple cysts do not require dedicated imaging surveillance. No hydronephrosis or hydroureter. Bladder is normal. Stomach/Bowel: No bowel dilatation to indicate ileus or obstruction. Extensive descending and sigmoid colon diverticulosis without evidence of acute diverticulitis. Appendix is normal. Lymphatic: No enlarged abdominopelvic lymph nodes. Reproductive: Moderate prostatomegaly. Other: No abdominal wall hernia or abnormality. No abdominopelvic ascites. Musculoskeletal: No acute or significant osseous findings. Review of the MIP images confirms the above findings. IMPRESSION: 1. No aortic dissection or intramural hematoma. 2. 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 Recommend follow-up ultrasound every 3 years. 3. 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 4. 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 Moderate narrowing at the origin of the right main renal artery. 5. Near complete occlusion of the proximal left common femoral artery due to mixed calcified  and noncalcified plaque. Visualized segments of the left profunda femoris artery are occluded due to dense calcified plaque at the origin. 6. Severe stenosis of the proximal left SFA due to densely calcified plaque. 7. 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 8. Extensive descending and sigmoid  colon diverticulosis without evidence of acute diverticulitis. 9. Moderate prostatomegaly. Electronically Signed   By: Acquanetta Belling M.D.   On: 02/04/2024 18:07   DG Chest 2 View Result Date: 02/04/2024 CLINICAL DATA:  Weakness for 2 days.  Tachypnea and chills. EXAM: CHEST - 2 VIEW COMPARISON:  Radiographs 09/16/2021 and 07/29/2020.  CT 08/18/2015. FINDINGS: Lordotic positioning on the frontal examination. The heart size is stable at the upper limits of normal. There is mild aortic atherosclerosis. The lungs appear clear. No evidence of pleural effusion or pneumothorax. Previous left shoulder reverse arthroplasty without evidence of acute osseous abnormality. IMPRESSION: Stable examination.  No evidence of active cardiopulmonary process. Electronically Signed   By: Carey Bullocks M.D.   On: 02/04/2024 16:03     Assessment and Plan:   Acute on Chronic HFrEF Patient presented primarily for significant lower extremity weakness. However, work-up revealed elevated BNP of 896. No evidence of edema on chest x-ray or CTA. Echo this admission showed LVEF of 35-40% with akinesis of the inferior wall , posterior wall, basal inferoseptal segment as well as an akinetic apex as well as grade 1 diastolic dysfunction. Wall motion abnormalities are not new but EF slightly lower than 45-50% in 2022. Patient has received a couple of dose of IV Lasix with good urinary output. Documented urinary output of 2.3L so far today. - Euvolemic on exam. - He has had excellent urinary response to IV Lasix. I think we can likely switch to PO Lasix soon (possibly tomorrow).  - Patient is on Nadolol at home but  this was held on admission to soft BP. BP stable. Would recommend restarting Toprol-XL rather than Nadolol given low EF. - Patient is on Jardiance 25mg  daily at home. This was not resumed on admission. OK to restart this. - Will hold off on ARB/ ARNI or MRA due to AKI. - Continue to monitor daily weights, strict I/Os, and renal function.  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 Looks like EF has slowly declined over the last few months. He denies any chest pain. I think it is reasonable to try to optimize GDMT and then can repeat Echo to reassess EF prior to proceeding with an ischemic evaluation. Will discuss with MD.  CAD History of multiple Mis in the 56s and 1980s. S/p angioplasty in the mid 1990s. High-sensitivity troponin negative this admission.  - No chest pain. - Continue aspirin and statin.  PAD Patient has a significant history of PAD. CTA this admission showed long segment severe stenosis of the proximal superior mesenteric artery due to predominantly non-calcified plaque, near complete occlusion at the origin of the left main renal artery and moderate narrowing at the origin of the right main renal artery, near complete occlusion of the proximal left common femoral artery due to mixed calcified and non-calcified plaque, severe stenosis of the proximal left SFA. ABIs this admission are moderately reduced at 0.69 bilaterally (right stable from last imaging in 12/2022 but left  has progressed some). - I don't think patient leg pain/ weakness is due to PAD. Sounds like main issues is knee pain. He underwent left knee injection earlier today. Pain does not sound like claudication.  - Continue aspirin and statin. - No evidence of critical limb ischemia. I don't think any inpatient work-up is necessary. Can likely follow back up with Dr. Kirke Corin as an out patient.  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  Hypertension BP well controlled.  - Continue medications for CHF as above.  Hyperlipidemia Lipid panel this admission: Total Cholesterol 113, Triglycerides 93, HDL 51, LDLD 43. LDL goal <55 given CAD and PAD. - Continue Crestor 40mg  daily.  AKI vs Progressive CKD Stage III Last known creatinine 1.3 in 2022. Creatinine 1.66 on admission.  - Creatinine up to 1.71 today. - Continue to monitor closely.   Otherwise, per primary team: - Generalized weakness - COPD - Type 2 diabetes mellitus - BPH - Rash on forehead   Risk Assessment/Risk Scores:    New York Heart Association (NYHA) Functional Class NYHA Class III   For questions or updates, please contact [facility] Please consult [email or web address] for contact info under    Signed, Corrin Parker, PA-C  02/06/2024 1:48 PM    Patient seen and examined. Agree with assessment and plan of the extensive note above.  Steven Newman is an 83 year old gentleman with an extensive history of CAD and peripheral vascular disease.  He is followed by Dr. Kary Kos.  He is status post remote myocardial infarctions and coronary intervention.  In 2022 EF was 45 to 50% with wall motion abnormality in the posterior and basal anterolateral walls as well as the apex being dyskinetic consistent with wall motion abnormality involving the circumflex and LAD territory.  He has documented moderately depressed ABIs most prominent on the right at 0.66 and mildly at the left lower extremity at 0.84.  He also has documented carotid stenoses most prominent 60 to 79% on left and 40 to 59% in the right..  He has documented mild infrarenal abdominal aortic aneurysm at 3.1 cm and significant stenosis of the proximal superior mesenteric artery with significant stenosis at the origin of the left main renal  moderate at the right.  He has been found to have high-grade near occlusion of the proximal left common femoral artery with severe stenosis in the  left SFA and aneurysmal dilatation of the right common iliac artery.  Addition he does have moderate to severe recall and multi level of lumbar spondylosis.  He was recently admitted significant difficulty with walking.  He has significant knee discomfort and I suspect most of his discomfort is related to the arthritic disease rather than vascular insufficiency.  Presently, he is pain-free and without anginal symptomatology.  An echo Doppler study during this admission showed further reduction of LV function at 35 to 40%.  He did have mild heart failure component with BNP elevation 896.  He has responded well to IV Lasix with good urine output.  As only he appears fairly euvolemic on exam.  Would recommend resumption of metoprolol XL rather than nadolol.  Will monitor renal function but probably okay to reinitiate Jardiance.  Plan to try to optimize guideline directed medical therapy the future repeat EF for reassessment of potential benefit.  At present, recommend medical management.  Dr. Kennith Maes will be in the hospital on Wednesday.  Lennette Bihari, MD, [facility] 02/06/2024 5:24 PM

## 2024-02-06 NOTE — Progress Notes (Signed)
 [facility] PROGRESS NOTE   Steven Newman ZOX:[id number] DOB: May 05, 1941 DOA: 02/04/2024  PCP: Lorenda Ishihara, MD  Brief History: 83 y.o. 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  He also mentioned bilateral lower extremity pain left greater than right worse with ambulation.  Concern was for peripheral artery disease.  Patient was hospitalized for further management    Consultants: Vascular surgery has been consulted.  Procedures: Transthoracic echocardiogram is pending    Subjective/Interval History: Patient denies any chest pain this morning.  No nausea vomiting.  Occasional shortness of breath.  Seems to be a bit more with that this morning and was able to answer questions more appropriately.  His main concern seems to be his left knee.     Assessment/Plan:  Ambulatory dysfunction/concern for claudication/left-sided knee pain  CT angiogram raise concern for multiple vascular issues and the abdomen as well as in the lower extremities. 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  Near complete occlusion of the proximal left CFA and left profunda femoris.  Severe stenosis of the proximal left SFA was also noted.  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 These findings were discussed with vascular surgery who evaluated the patient.  They did not feel that vascular compromise is causing his symptoms. Discussed with patient's wife to get additional  history as history from patient was inadequate. Patient has had knee pain for a long time. No acute worsening in the pain per wife. No recent fall on the knee in the last week or so.  Based on information even though it was possible that arthritis could be contributing but we need to rule out spinal issues.  MRI of the cervical and thoracic spine was done last night which did not show any spinal stenosis.  MRI lumbar spine is still pending. If MRI lumbar spine is also unremarkable then we will have orthopedics evaluate his left knee.  He might benefit from arthrocentesis and intra-articular injection.  Uric acid level noted to be 6.1.  Continue aspirin and statin.  LDL is 43.  Acute on chronic systolic and diastolic CHF Echocardiogram from October 2022 showed EF of 40 to 45% with grade 2 diastolic dysfunction. Echo was repeated and shows worsening systolic function with EF of 35 to 40%.  Grade 1 diastolic dysfunction was noted. No significant volume overload was noted.  He was given IV Lasix initially but then held in case angiogram was to be performed for his lower extremities.  Creatinine noted to be higher today.   Will reinitiate furosemide.  Monitor renal function closely.   Status is stable.   Home medication list reviewed.  He is noted to be on nadolol prior to admission which is currently on hold due to soft blood pressures.  Should be able to resume if blood pressure stabilizes.  Not noted to be on any ACE inhibitor or ARB.  Not noted to be on any diuretics.  Chronic kidney disease stage IIIa/metabolic acidosis No recent labs in our system.  Creatinine was 1.3 in 2022.  Care everywhere also reviewed. Came in with creatinine of 1.66.  Noted to be 1.71.  Monitor urine output.  Avoid nephrotoxic agents.    History of coronary artery disease He has had MI and PCI previously.  Continue aspirin.  Apparently had not been taking his statin which has been resumed.  Diabetes mellitus type 2 Home  medication list does not show that he is on any diabetic medications.  SSI.  Monitor CBGs.  HbA1c 6.9.  History of COPD Stable.  Continue inhalers.  Skin rash versus skin cancer Apparently seen by dermatology recently and was prescribed cephalexin and triamcinolone which will be continued.  Mild infrarenal AAA Outpatient monitoring.  BPH No clinical symptoms currently.  Outpatient management.  Continue mirabegron.  Colonic diverticulosis No evidence for diverticulitis.  Avoid constipation.  History of bladder cancer Outpatient monitoring.  DVT Prophylaxis: Subcutaneous heparin Code Status: Full code Family Communication: Discussed with patient Disposition Plan: To be determined  Status is: Observation The patient will require care spanning > 2 midnights and should be moved to inpatient because: IV diuretics.  Ambulatory dysfunction.      Medications: Scheduled:  aspirin EC  81 mg Oral Daily   cephALEXin  500 mg Oral TID   heparin  5,000 Units Subcutaneous Q8H   insulin aspart  0-9 Units Subcutaneous TID WC   mirabegron ER  25 mg Oral Daily   rosuvastatin  40 mg Oral Daily   sodium chloride flush  3 mL Intravenous Q12H   triamcinolone cream  1 Application Topical BID   umeclidinium-vilanterol  1 puff Inhalation Daily   Continuous:   [id number] **OR** acetaminophen, ipratropium-albuterol, ondansetron **OR** ondansetron (ZOFRAN) IV, sodium chloride flush  Antibiotics: Anti-infectives (From admission, onward)    Start     Dose/Rate Route Frequency Ordered Stop   02/05/24 1030  cephALEXin (KEFLEX) capsule 500 mg        500 mg Oral 3 times daily 02/05/24 0936 02/10/24 0959       Objective:  Vital Signs  Vitals:   02/06/24 0500 02/06/24 0505 02/06/24 0735 02/06/24 0851  BP:  134/74 (!) 142/66 115/61  Pulse:  68 67 81  Resp:  20 (!) 21 12  Temp:  97.9 F (36.6 C) 97.9 F (36.6 C)   TempSrc:  Oral Oral   SpO2:  96% 96%   Weight: 88.5 kg      Height:        Intake/Output Summary (Last 24 hours) at 02/06/2024 1112 Last data filed at 02/06/2024 0900 Gross per 24 hour  Intake 480 ml  Output 2350 ml  Net -1870 ml   Filed Weights   02/04/24 1420 02/05/24 0142 02/06/24 0500  Weight: 93.9 kg 89.5 kg 88.5 kg    General appearance: Awake alert.  In no distress Resp: Few crackles at the bases.  Normal effort at rest. Cardio: S1-S2 is normal regular.  No S3-S4.  No rubs murmurs or bruit GI: Abdomen is soft.  Nontender nondistended.  Bowel sounds are present normal.  No masses organomegaly Extremities: Decreased range of motion of the left knee.  Left knee noted to be swollen but no erythema or warmth is noted.  Tender to palpate. Neurologic: No focal neurological deficits.     Lab Results:  Data Reviewed: I have personally reviewed following labs and reports of the imaging studies  CBC: Recent Labs  Lab 02/04/24 1423 02/05/24 0237 02/06/24 0304  WBC 8.4 9.6 9.2  NEUTROABS 6.1  --   --  HGB 14.9 14.8 14.3  HCT 46.2 46.6 43.0  MCV 93.7 94.5 91.1  PLT 152 138* 130*    Basic Metabolic Panel: Recent Labs  Lab 02/04/24 1423 02/05/24 0237 02/06/24 0304  NA 135 138 135  K 4.8 3.9 4.0  CL 105 105 99  CO2 22 15* 20*  GLUCOSE 154* 145* 145*  BUN 37* 32* 36*  CREATININE 1.66* 1.56* 1.71*  CALCIUM 8.9 9.0 8.8*    GFR: Estimated Creatinine Clearance: 35.7 mL/min (A) (by C-G formula based on SCr of 1.71 mg/dL (H)).  Liver Function Tests: Recent Labs  Lab 02/04/24 1423 02/05/24 0237  AST 35 21  ALT 46* 36  ALKPHOS 56 43  BILITOT 0.6 1.2  PROT 7.8 6.9  ALBUMIN 3.4* 2.8*     HbA1C: Recent Labs    02/05/24 0237  HGBA1C 6.9*    CBG: Recent Labs  Lab 02/05/24 0532 02/05/24 1159 02/05/24 1601 02/05/24 2058 02/06/24 0502  GLUCAP 149* 179* 179* 174* 146*    Lipid Profile: Recent Labs    02/05/24 0237  CHOL 113  HDL 51  LDLCALC 43  TRIG 93  CHOLHDL 2.2     Recent Results (from the past  240 hours)  Resp panel by RT-PCR (RSV, Flu A&B, Covid) Anterior Nasal Swab     Status: None   Collection Time: 02/04/24  2:23 PM   Specimen: Anterior Nasal Swab  Result Value Ref Range Status   SARS Coronavirus 2 by RT PCR NEGATIVE NEGATIVE Final    Comment: (NOTE) SARS-CoV-2 target nucleic acids are NOT DETECTED.  The SARS-CoV-2 RNA is generally detectable in upper respiratory specimens during the acute phase of infection. The lowest concentration of SARS-CoV-2 viral copies this assay can detect is 138 copies/mL. A negative result does not preclude SARS-Cov-2 infection and should not be used as the sole basis for treatment or other patient management decisions. A negative result may occur with  improper specimen collection/handling, submission of specimen other than nasopharyngeal swab, presence of viral mutation(s) within the areas targeted by this assay, and inadequate number of viral copies(<138 copies/mL). A negative result must be combined with clinical observations, patient history, and epidemiological information. The expected result is Negative.  Fact Sheet for Patients:  BloggerCourse.com  Fact Sheet for Healthcare Providers:  SeriousBroker.it  This test is no t yet approved or cleared by the [place] FDA and  has been authorized for detection and/or diagnosis of SARS-CoV-2 by FDA under an Emergency Use Authorization (EUA). This EUA will remain  in effect (meaning this test can be used) for the duration of the COVID-19 declaration under Section 564(b)(1) of the Act, 21 U.S.C.section 360bbb-3(b)(1), unless the authorization is terminated  or revoked sooner.       Influenza A by PCR NEGATIVE NEGATIVE Final   Influenza B by PCR NEGATIVE NEGATIVE Final    Comment: (NOTE) The Xpert Xpress SARS-CoV-2/FLU/RSV plus assay is intended as an aid in the diagnosis of influenza from Nasopharyngeal swab specimens and should  not be used as a sole basis for treatment. Nasal washings and aspirates are unacceptable for Xpert Xpress SARS-CoV-2/FLU/RSV testing.  Fact Sheet for Patients: BloggerCourse.com  Fact Sheet for Healthcare Providers: SeriousBroker.it  This test is not yet approved or cleared by the [place] FDA and has been authorized for detection and/or diagnosis of SARS-CoV-2 by FDA under an Emergency Use Authorization (EUA). This EUA will remain in effect (meaning this test can be used) for the duration of the COVID-19  declaration under Section 564(b)(1) of the Act, 21 U.S.C. section 360bbb-3(b)(1), unless the authorization is terminated or revoked.     Resp Syncytial Virus by PCR NEGATIVE NEGATIVE Final    Comment: (NOTE) Fact Sheet for Patients: BloggerCourse.com  Fact Sheet for Healthcare Providers: SeriousBroker.it  This test is not yet approved or cleared by the [place] FDA and has been authorized for detection and/or diagnosis of SARS-CoV-2 by FDA under an Emergency Use Authorization (EUA). This EUA will remain in effect (meaning this test can be used) for the duration of the COVID-19 declaration under Section 564(b)(1) of the Act, 21 U.S.C. section 360bbb-3(b)(1), unless the authorization is terminated or revoked.  Performed at [facility], [address], [place], [place] [address]       Radiology Studies: MR THORACIC SPINE WO CONTRAST Result Date: 02/06/2024 CLINICAL DATA:  Initial evaluation for ataxia, thoracic pathology suspected. EXAM: MRI THORACIC SPINE WITHOUT CONTRAST TECHNIQUE: Multiplanar, multisequence MR imaging of the thoracic spine was performed. No intravenous contrast was administered. COMPARISON:  None Available. FINDINGS: Alignment: Mild dextroscoliosis. Alignment otherwise normal with preservation of the normal thoracic kyphosis. No listhesis.  Vertebrae: Vertebral body height maintained without acute or chronic fracture. Bone marrow signal intensity within normal limits. No worrisome osseous lesions or abnormal marrow edema. Cord:  Normal signal and morphology. Paraspinal and other soft tissues: Paraspinous soft tissues demonstrate no acute finding. Multiple T2 hyperintense renal cysts noted, benign in appearance, no follow-up imaging recommended. Disc levels: Ordinary for age multilevel disc desiccation noted throughout the thoracic spine. Mild degenerative endplate spurring noted at T8-9 through T12-L1. Mild multilevel facet hypertrophy. No significant spinal stenosis. Foramina remain patent. No overt neural impingement. IMPRESSION: 1. Normal MRI appearance of the thoracic spinal cord. No cord signal changes to suggest myelopathy. 2. Mild for age thoracic spondylosis without significant stenosis or overt neural impingement. Electronically Signed   By: Rise Mu M.D.   On: 02/06/2024 02:35   MR CERVICAL SPINE WO CONTRAST Result Date: 02/06/2024 CLINICAL DATA:  Initial evaluation for ataxia, cervical pathology suspected. EXAM: MRI CERVICAL SPINE WITHOUT CONTRAST TECHNIQUE: Multiplanar, multisequence MR imaging of the cervical spine was performed. No intravenous contrast was administered. COMPARISON:  None Available. FINDINGS: Alignment: Examination degraded by motion artifact. Straightening of the normal cervical lordosis.  No listhesis. Vertebrae: Vertebral body height maintained without acute or chronic fracture. Bone marrow signal intensity within normal limits. No worrisome osseous lesions. No abnormal marrow edema. Cord: Normal signal and morphology. Posterior Fossa, vertebral arteries, paraspinal tissues: Unremarkable. Disc levels: C2-C3: Mild left-sided uncovertebral spurring without significant disc bulge. Left-sided facet degeneration with bony ankylosis. No spinal stenosis. Mild left C3 foraminal narrowing. Right neural foramen  remains patent. C3-C4: Degenerative vertebral disc space narrowing with diffuse disc osteophyte complex. Posterior component flattens and partially faces the ventral thecal sac. Mild to moderate left greater than right facet hypertrophy. No significant spinal stenosis. Severe left with moderate right C4 foraminal narrowing. C4-C5: Mild right-sided uncovertebral spurring without significant disc bulge. Moderate right-sided facet arthrosis. No spinal stenosis. Severe right C5 foraminal narrowing. Left neural foramen remains patent. C5-C6: Minimal disc bulge with uncovertebral spurring. Moderate left with mild right facet hypertrophy. No spinal stenosis. Severe left with moderate right C6 foraminal narrowing. C6-C7: Mild disc bulge with uncovertebral spurring. Mild bilateral facet hypertrophy. No significant spinal stenosis. Mild to moderate bilateral C7 foraminal narrowing, slightly worse on the right. C7-T1: Negative interspace. Mild facet hypertrophy. No canal or foraminal stenosis. IMPRESSION: 1. Normal  MRI appearance of the cervical spinal cord. No cord signal changes to suggest myelopathy or significant spinal stenosis. 2. Multilevel cervical spondylosis with associated moderate to severe bilateral C4 through C7 foraminal narrowing as above. 3. Moderate multilevel facet hypertrophy as above. Findings could contribute to underlying neck pain. Electronically Signed   By: Rise Mu M.D.   On: 02/06/2024 02:29   VAS [place] ABI WITH/WO TBI Result Date: 02/05/2024  LOWER EXTREMITY DOPPLER STUDY Patient Name:  BREEZE ANGELL  Date of Exam:   02/05/2024 Medical Rec #: [id number]     Accession #:    [id number] Date of Birth: Dec 23, 1940     Patient Gender: M Patient Age:   5 years Exam Location:  [facility] Procedure:      VAS [place] ABI WITH/WO TBI Referring Phys: Tereasa Coop --------------------------------------------------------------------------------  Indications: Peripheral artery disease. (patient  followed by Dr. Kennith Maes in the              past) CTA 02/04/2024, indicated severe stenosis of the proximal SMA,              near occlusion at the origin of the left main renal artery, near              occlusion of proximal left femoral artery and the left profunda              artery, severe stenosis of the proximal left SFA, distal right CIA              aneurysm, moderate to severe stenosis at the origin of the right              internal iliac artery, narrowing of the right external iliac              artery, possible nonflow limiting focal dissection of the right              CFA. High Risk Factors: Hypertension, hyperlipidemia, Diabetes, prior MI, coronary                    artery disease. Other Factors: CKD IIIa, AAA, COPD, CHF.  Vascular Interventions: No vascular interventions. Comparison Study: Prior ABI done 02/01/2023 at [facility] Performing Technologist: Sherren Kerns RVS  Examination Guidelines: A complete evaluation includes at minimum, Doppler waveform signals and systolic blood pressure reading at the level of bilateral brachial, anterior tibial, and posterior tibial arteries, when vessel segments are accessible. Bilateral testing is considered an integral part of a complete examination. Photoelectric Plethysmograph (PPG) waveforms and toe systolic pressure readings are included as required and additional duplex testing as needed. Limited examinations for reoccurring indications may be performed as noted.  ABI Findings: +---------+------------------+-----+---------+--------+ Right    Rt Pressure (mmHg)IndexWaveform Comment  +---------+------------------+-----+---------+--------+ Brachial 124                    triphasic         +---------+------------------+-----+---------+--------+ PTA      86                0.69 biphasic          +---------+------------------+-----+---------+--------+ DP       86                0.69 biphasic           +---------+------------------+-----+---------+--------+ Great Toe65                0.52 Abnormal          +---------+------------------+-----+---------+--------+ +---------+------------------+-----+---------+-------+  Left     Lt Pressure (mmHg)IndexWaveform Comment +---------+------------------+-----+---------+-------+ Brachial 119                    triphasic        +---------+------------------+-----+---------+-------+ PTA      85                0.69 biphasic         +---------+------------------+-----+---------+-------+ DP       80                0.65 biphasic         +---------+------------------+-----+---------+-------+ Great Toe90                0.73 Abnormal         +---------+------------------+-----+---------+-------+ +-------+-----------+-----------+------------+------------+ ABI/TBIToday's ABIToday's TBIPrevious ABIPrevious TBI +-------+-----------+-----------+------------+------------+ Right  0.69       0.52       0.66        0.46         +-------+-----------+-----------+------------+------------+ Left   0.69       0.73       0.84        0.67         +-------+-----------+-----------+------------+------------+ Right ABIs and bilateral TBIs appear essentially unchanged compared to prior study on 02/01/2023. Left ABIs appear decreased compared to prior study on 02/01/23.  Summary: Right: Resting right ankle-brachial index indicates moderate right lower extremity arterial disease. The right toe-brachial index is abnormal. Left: Resting left ankle-brachial index indicates moderate left lower extremity arterial disease. The left toe-brachial index is abnormal. *See table(s) above for measurements and observations.  Electronically signed by Coral Else MD on 02/05/2024 at 9:27:15 PM.    Final    ECHOCARDIOGRAM COMPLETE Result Date: 02/05/2024    ECHOCARDIOGRAM REPORT   Patient Name:   Steven Newman Date of Exam: 02/05/2024 Medical Rec #:  [id number]    Height:        69.5 in Accession #:    [id number]   Weight:       197.3 lb Date of Birth:  17-Oct-1941    BSA:          2.064 m Patient Age:    82 years     BP:           116/72 mmHg Patient Gender: M            HR:           80 bpm. Exam Location:  Inpatient Procedure: 2D Echo, Cardiac Doppler and Color Doppler (Both Spectral and Color            Flow Doppler were utilized during procedure). Indications:     CHF-Acute Diastolic I50.31, CHF- Acute Systolic I50.21  History:         Patient has prior history of Echocardiogram examinations, most                  recent 09/11/2021. CHF, Previous Myocardial Infarction, PAD,                  COPD and CKD, stage 3; Risk Factors:Hypertension, Diabetes,                  Dyslipidemia and Sleep Apnea.  Sonographer:     Lucendia Herrlich RCS Referring Phys:  [id number] SUBRINA SUNDIL Diagnosing Phys: Epifanio Lesches MD IMPRESSIONS  1. Technically difficult study, consider limited echo with contrast to better evaluate LV systolic function. Left ventricular  ejection fraction, by estimation, is 35 to 40%. The left ventricle has moderately decreased function. The left ventricle demonstrates regional wall motion abnormalities (see scoring diagram/findings for description). There is moderate asymmetric left ventricular hypertrophy of the basal-septal segment. Left ventricular diastolic parameters are consistent with Grade I diastolic dysfunction (impaired relaxation).  2. Right ventricular systolic function is normal. The right ventricular size is not well visualized. Tricuspid regurgitation signal is inadequate for assessing PA pressure.  3. The mitral valve is normal in structure. Trivial mitral valve regurgitation.  4. The aortic valve was not well visualized. Aortic valve regurgitation is trivial. No aortic stenosis is present.  5. Aortic dilatation noted. 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  6. The inferior  vena cava is normal in size with greater than 50% respiratory variability, suggesting right atrial pressure of 3 mmHg. FINDINGS  Left Ventricle: Left ventricular ejection fraction, by estimation, is 35 to 40%. The left ventricle has moderately decreased function. The left ventricle demonstrates regional wall motion abnormalities. Strain imaging was not performed. The left ventricular internal cavity size was normal in size. There is moderate asymmetric left ventricular hypertrophy of the basal-septal segment. Left ventricular diastolic parameters are consistent with Grade I diastolic dysfunction (impaired relaxation).  LV Wall Scoring: The entire inferior wall, posterior wall, basal inferoseptal segment, and apex are akinetic. The entire anterior wall, antero-lateral wall, entire anterior septum, apical lateral segment, and mid inferoseptal segment are normal. Right Ventricle: The right ventricular size is not well visualized. No increase in right ventricular wall thickness. Right ventricular systolic function is normal. Tricuspid regurgitation signal is inadequate for assessing PA pressure. Left Atrium: Left atrial size was normal in size. Right Atrium: Right atrial size was normal in size. Pericardium: Trivial pericardial effusion is present. Presence of epicardial fat layer. Mitral Valve: The mitral valve is normal in structure. Trivial mitral valve regurgitation. Tricuspid Valve: The tricuspid valve is normal in structure. Tricuspid valve regurgitation is trivial. Aortic Valve: The aortic valve was not well visualized. Aortic valve regurgitation is trivial. No aortic stenosis is present. Aortic valve peak gradient measures 3.8 mmHg. Pulmonic Valve: The pulmonic valve was not well visualized. Pulmonic valve regurgitation is not visualized. Aorta: Aortic dilatation noted. 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 Venous: The inferior vena  cava is normal in size with greater than 50% respiratory variability, suggesting right atrial pressure of 3 mmHg. IAS/Shunts: The interatrial septum was not well visualized. Additional Comments: 3D imaging was not performed.  LEFT VENTRICLE PLAX 2D LVIDd:         4.80 cm      Diastology LVIDs:         4.00 cm      LV e' medial:    3.48 cm/s LV PW:         1.30 cm      LV E/e' medial:  11.2 LV IVS:        0.90 cm      LV e' lateral:   6.96 cm/s LVOT diam:     2.30 cm      LV E/e' lateral: 5.6 LV SV:         51 LV SV Index:   25 LVOT Area:     4.15 cm  LV Volumes (MOD) LV vol d, MOD A2C: 129.0 ml LV vol d, MOD  A4C: 160.0 ml LV vol s, MOD A2C: 88.3 ml LV vol s, MOD A4C: 107.0 ml LV SV MOD A2C:     40.7 ml LV SV MOD A4C:     160.0 ml LV SV MOD BP:      46.0 ml RIGHT VENTRICLE             IVC RV S prime:     12.60 cm/s  IVC diam: 1.00 cm LEFT ATRIUM             Index        RIGHT ATRIUM           Index LA diam:        3.90 cm 1.89 cm/m   RA Area:     13.70 cm LA Vol (A2C):   32.4 ml 15.67 ml/m  RA Volume:   31.30 ml  15.16 ml/m LA Vol (A4C):   33.5 ml 16.23 ml/m LA Biplane Vol: 33.6 ml 16.28 ml/m  AORTIC VALVE AV Area (Vmax): 3.77 cm AV Vmax:        97.10 cm/s AV Peak Grad:   3.8 mmHg LVOT Vmax:      88.02 cm/s LVOT Vmean:     53.275 cm/s LVOT VTI:       0.123 m  AORTA Ao Root diam: 4.20 cm Ao Asc diam:  4.00 cm MITRAL VALVE MV Area (PHT): 3.65 cm    SHUNTS MV Decel Time: 208 msec    Systemic VTI:  0.12 m MV E velocity: 39.00 cm/s  Systemic Diam: 2.30 cm MV A velocity: 68.50 cm/s MV E/A ratio:  0.57 Epifanio Lesches MD Electronically signed by Epifanio Lesches MD Signature Date/Time: 02/05/2024/6:36:38 PM    Final (Updated)    DG Knee Left Port Result Date: 02/05/2024 CLINICAL DATA:  Pain EXAM: PORTABLE PELVIS 1-2 VIEWS; PORTABLE LEFT KNEE - 1-2 VIEW; PORTABLE RIGHT KNEE - 1-2 VIEW COMPARISON:  CT angiography chest abdomen pelvis 02/04/2024 FINDINGS: Pelvis: No fracture or dislocation. Extensive  Atherosclerotic changes seen throughout visualized arterial segments. Moderate bilateral hip osteoarthrosis. Right knee: No fracture or dislocation. 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 Moderate knee joint effusion. 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 No fracture or dislocation. Calcific densities in the distal femur favored to be a benign chondroid lesion. Small knee joint effusion. IMPRESSION: 1. No acute fracture or dislocation of the pelvis or knees. 2. Moderate right and small left knee joint effusions. 3. Moderate bilateral hip osteoarthrosis. Electronically Signed   By: Acquanetta Belling M.D.   On: 02/05/2024 17:42   DG Knee Right Port Result Date: 02/05/2024 CLINICAL DATA:  Pain EXAM: PORTABLE PELVIS 1-2 VIEWS; PORTABLE LEFT KNEE - 1-2 VIEW; PORTABLE RIGHT KNEE - 1-2 VIEW COMPARISON:  CT angiography chest abdomen pelvis 02/04/2024 FINDINGS: Pelvis: No fracture or dislocation. Extensive Atherosclerotic changes seen throughout visualized arterial segments. Moderate bilateral hip osteoarthrosis. Right knee: No fracture or dislocation. 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 Moderate knee joint effusion. 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 No fracture or dislocation. Calcific densities in the distal femur favored to be a benign chondroid lesion. Small knee joint effusion. IMPRESSION: 1. No acute fracture or dislocation of the pelvis or knees. 2. Moderate right and small left knee joint effusions. 3. Moderate bilateral hip osteoarthrosis. Electronically Signed   By: Acquanetta Belling M.D.   On: 02/05/2024 17:42   DG Pelvis Portable Result Date: 02/05/2024 CLINICAL DATA:  Pain EXAM: PORTABLE  PELVIS 1-2 VIEWS; PORTABLE LEFT KNEE - 1-2 VIEW; PORTABLE RIGHT KNEE - 1-2 VIEW COMPARISON:  CT angiography chest abdomen pelvis 02/04/2024 FINDINGS: Pelvis: No fracture or dislocation. Extensive Atherosclerotic changes seen  throughout visualized arterial segments. Moderate bilateral hip osteoarthrosis. Right knee: No fracture or dislocation. 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 Moderate knee joint effusion. 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 No fracture or dislocation. Calcific densities in the distal femur favored to be a benign chondroid lesion. Small knee joint effusion. IMPRESSION: 1. No acute fracture or dislocation of the pelvis or knees. 2. Moderate right and small left knee joint effusions. 3. Moderate bilateral hip osteoarthrosis. Electronically Signed   By: Acquanetta Belling M.D.   On: 02/05/2024 17:42   CT Angio Chest/Abd/Pel for Dissection W and/or Wo Contrast Result Date: 02/04/2024 CLINICAL DATA:  Acute aortic syndrome suspected. Weakness. Tachypnea. Chills. EXAM: CT ANGIOGRAPHY CHEST, ABDOMEN AND PELVIS TECHNIQUE: Non-contrast CT of the chest was initially obtained. Multidetector CT imaging through the chest, abdomen and pelvis was performed using the standard protocol during bolus administration of intravenous contrast. Multiplanar reconstructed images and MIPs were obtained and reviewed to evaluate the vascular anatomy. RADIATION DOSE REDUCTION: This exam was performed according to the departmental dose-optimization program which includes automated exposure control, adjustment of the mA and/or kV according to patient size and/or use of iterative reconstruction technique. CONTRAST:  80mL OMNIPAQUE IOHEXOL 350 MG/ML SOLN COMPARISON:  Chest two-view 02/04/2024 FINDINGS: CTA CHEST FINDINGS Cardiovascular: Heart size within normal limits. Severe triple vessel coronary artery calcifications. No pulmonary artery embolism. No aortic dissection or intramural hematoma. Mediastinum/Nodes: No enlarged mediastinal, hilar, or axillary lymph nodes. Thyroid gland, trachea, and esophagus demonstrate no significant findings. Lungs/Pleura: No focal airspace opacity to indicate  pneumonia. Scattered tiny calcified pulmonary nodules consistent with prior granulomatous inflammation. Musculoskeletal: Numerous old healed bilateral rib fractures are seen. No acute osseous abnormality. Review of the MIP images confirms the above findings. CTA ABDOMEN AND PELVIS FINDINGS VASCULAR Aorta: No aortic dissection. Calcified and noncalcified atheromatous plaque seen throughout the abdominal aorta with mild aneurysm of the infrarenal segment measuring up to 3.1 x 3.0 cm. Celiac: Patent without evidence of aneurysm, dissection, vasculitis or significant stenosis. 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 Moderate narrowing at the origin of the right main renal artery. IMA: Near complete occlusion at the origin of the IMA, which is otherwise patent. Right lower extremity arteries: Mild narrowing of the common iliac artery origin due to calcified atheromatous plaque. 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 Moderate to severe stenosis at the origin of the right internal iliac artery which is diffusely calcified but otherwise patent. Mild diffuse narrowing of the right external iliac artery due to calcified atheromatous plaque. Irregular appearance of the right common femoral artery with a possible nonflow limiting focal dissection. Stranding anterior to the right common femoral artery likely related to prior surgical intervention. No high-grade stenosis of the visualized proximal profunda femoris are superficial femoral arteries. Left lower extremity arteries: Diffuse calcified and noncalcified atheromatous plaque of the left common iliac artery without flow-limiting stenosis. Proximal left internal iliac artery is occluded. Opacification of distal branches consistent with  retrograde collateral flow. Mild diffuse narrowing of the external iliac artery due to diffusely calcified plaque. Near complete occlusion  of the proximal left common femoral artery due to mixed calcified and noncalcified plaque. Moderate stenosis of the distal left common femoral artery due to densely calcified plaque. Visualized segments of the left profunda femoris artery are occluded due to dense calcified plaque at the origin. Severe stenosis of the proximal left SFA due to densely calcified plaque. Veins: No obvious venous abnormality within the limitations of this arterial phase study. Review of the MIP images confirms the above findings. NON-VASCULAR Hepatobiliary: Mild pneumobilia likely related to prior sphincterotomy. Please correlate with patient history. Liver, gallbladder, and bile ducts are otherwise normal. Pancreas: Diffuse fatty atrophy of the pancreas which is otherwise unremarkable. Spleen: Normal in size without focal abnormality. Adrenals/Urinary Tract: Adrenal glands are normal. Subcentimeter renal hypodensities are too small to fully characterize, however most likely represent simple cysts which do not require dedicated imaging follow-up. Multiple additional simple cysts are also seen bilaterally with the largest located in the left kidney measuring 4.2 cm. These simple cysts do not require dedicated imaging surveillance. No hydronephrosis or hydroureter. Bladder is normal. Stomach/Bowel: No bowel dilatation to indicate ileus or obstruction. Extensive descending and sigmoid colon diverticulosis without evidence of acute diverticulitis. Appendix is normal. Lymphatic: No enlarged abdominopelvic lymph nodes. Reproductive: Moderate prostatomegaly. Other: No abdominal wall hernia or abnormality. No abdominopelvic ascites. Musculoskeletal: No acute or significant osseous findings. Review of the MIP images confirms the above findings. IMPRESSION: 1. No aortic dissection or intramural hematoma. 2.  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 Recommend follow-up ultrasound every 3 years. 3. 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 4. 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 Moderate narrowing at the origin of the right main renal artery. 5. 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 Visualized segments of the left profunda femoris artery are occluded due to dense calcified plaque at the origin. 6. Severe stenosis of the proximal left SFA due to densely calcified plaque. 7. 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 8. Extensive descending and sigmoid colon diverticulosis without evidence of acute diverticulitis. 9. Moderate prostatomegaly. Electronically Signed   By: Acquanetta Belling M.D.   On: 02/04/2024 18:07   DG Chest 2 View Result Date: 02/04/2024 CLINICAL DATA:  Weakness for 2 days.  Tachypnea and chills. EXAM: CHEST - 2 VIEW COMPARISON:  Radiographs 09/16/2021 and 07/29/2020.  CT 08/18/2015. FINDINGS: Lordotic positioning on the frontal examination. The heart size is stable at the upper limits of normal. There is mild aortic atherosclerosis. The lungs appear clear. No evidence of pleural effusion or pneumothorax. Previous left shoulder reverse arthroplasty without evidence of acute osseous abnormality. IMPRESSION: Stable examination.  No evidence of active cardiopulmonary process. Electronically Signed   By: Carey Bullocks M.D.   On: 02/04/2024 16:03       LOS: 0 days   Edvin Albus Rito Ehrlich  [facility] Pager on [email or web address]  02/06/2024, 11:12 AM

## 2024-02-06 NOTE — Plan of Care (Signed)

## 2024-02-06 NOTE — Progress Notes (Signed)
 Heart Failure Navigator Progress Note  Assessed for Heart & Vascular TOC clinic readiness.  Patient does not meet criteria due to ambulary disfunction and weakness, patient will be discharge to SNF for Rehab. Has a scheduled [facility] appointment on 02/14/2024. No HF TOC .   Navigator will sign off at this time.   Rhae Hammock, BSN, Scientist, clinical (histocompatibility and immunogenetics) Only

## 2024-02-06 NOTE — Procedures (Signed)
 Procedure: Left knee injection   Indication: Left knee arthritis   Surgeon: Charma Igo, PA-C   Assist: None   Anesthesia: Topical refrigerant   EBL: None   Complications: None   Findings: After risks/benefits explained patient desires to undergo procedure. Consent obtained and time out performed. The left knee was sterilely prepped and injected. Pt tolerated the procedure well.       Freeman Caldron, PA-C Orthopedic Surgery [phone]

## 2024-02-06 NOTE — TOC Initial Note (Signed)
 Transition of Care [facility]) - Initial/Assessment Note    Patient Details  Name: Steven Newman MRN: [id number] Date of Birth: Jun 17, 1941  [facility]) CM/SW Contact:    Michaela Corner, LCSWA Phone Number: 02/06/2024, 12:04 PM  Clinical Narrative:   CSW met patient and his spouse at bedside and introduced self/role. Patient is from home with his wife. Per CIR, they will rescreen when medical work up is complete. CSW spoke with spouse and patient about SNF as a backup plan to CIR, patient and spouse are agreeable to CSW sending referrals.  TOC will continue to follow.   Expected Discharge Plan:  (CIR vs SNF) Barriers to Discharge: Continued Medical Work up, SNF Pending bed offer   Patient Goals and CMS Choice Patient states their goals for this hospitalization and ongoing recovery are:: Getn stronger to be able to go home          Expected Discharge Plan and Services In-house Referral: Clinical Social Work     Living arrangements for the past 2 months: Single Family Home                                      Prior Living Arrangements/Services Living arrangements for the past 2 months: Single Family Home Lives with:: Self, Spouse Patient language and need for interpreter reviewed:: Yes Do you feel safe going back to the place where you live?: Yes      Need for Family Participation in Patient Care: Yes (Comment) Care giver support system in place?: Yes (comment)   Criminal Activity/Legal Involvement Pertinent to Current Situation/Hospitalization: No - Comment as needed  Activities of Daily Living   ADL Screening (condition at time of admission) Independently performs ADLs?: Yes (appropriate for developmental age) Is the patient deaf or have difficulty hearing?: Yes (hearing aids at home) Does the patient have difficulty seeing, even when wearing glasses/contacts?: Yes (reading glasses) Does the patient have difficulty concentrating, remembering, or making  decisions?: No  Permission Sought/Granted Permission sought to share information with : Family Supports Permission granted to share information with : Yes, Verbal Permission Granted  Share Information with NAME: Bronson Ing     Permission granted to share info w Relationship: spouse     Emotional Assessment Appearance:: Appears stated age Attitude/Demeanor/Rapport: Engaged Affect (typically observed): Pleasant Orientation: : Oriented to Self, Oriented to Place, Oriented to  Time, Oriented to Situation Alcohol / Substance Use: Not Applicable Psych Involvement: No (comment)  Admission diagnosis:  Weakness [R53.1] Acute exacerbation of CHF (congestive heart failure) (HCC) [I50.9] AKI (acute kidney injury) (HCC) [N17.9] Acute congestive heart failure, unspecified heart failure type (HCC) [I50.9] Patient Active Problem List   Diagnosis Date Noted   Chronic kidney disease, stage 3a (HCC) 02/05/2024   History of abdominal aortic aneurysm (AAA) 02/05/2024   Non-insulin dependent type 2 diabetes mellitus (HCC) 02/05/2024   Acute on chronic combined systolic and diastolic CHF (congestive heart failure) (HCC) 02/05/2024   Generalized weakness 02/05/2024   History of COPD 02/05/2024   BPH (benign prostatic hyperplasia) 02/05/2024   Tinnitus of both ears 10/19/2023   History of artificial lens replacement    Exposure to Agent Orange    History of adenomatous polyp of colon    Myocardial infarction    Microalbuminuria    Peripheral artery disease (HCC)    Thrombocytopenia    Urinary incontinence    Venous insufficiency (chronic) (peripheral)  Vitamin D deficiency    Hypercholesterolemia    History of total shoulder replacement, left 08/07/2021   Abnormal gait 05/30/2021   Essential hypertension 05/30/2021   Sensorineural hearing loss, bilateral 05/30/2021   Chronic kidney disease, stage 3b 05/30/2021   Rotator cuff tear arthropathy 05/14/2021   Bilateral hip bursitis 04/24/2021    Osteoarthritis of left knee 04/24/2021   Osteoarthritis of right knee 04/24/2021   Mild neurocognitive disorder 03/04/2021   Pain in joint of left shoulder 01/14/2021   Neck pain 03/05/2015   Abdominal aortic aneurysm 11/21/2014   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/09/2013   OSA (obstructive sleep apnea) 08/09/2013   Hyperlipidemia    Atherosclerotic heart disease of native coronary artery without angina pectoris    Obesity    History of skin cancer    Type 2 diabetes mellitus with peripheral angiopathy (HCC)    History of skin cancer    Erectile dysfunction    PCP:  Lorenda Ishihara, MD Pharmacy:   [id number] - [place], [place] - [address] [place] [address] Phone: [phone] Fax: [phone]  [facility] - [place], [place] - [address] [place] [address] Phone: [phone] Fax: [phone]  Prescription [address] [place], [place] - [address] [place] [address] Phone: [phone] Fax: [phone]  [facility] - [place], [place] - [address] [place] [address] Phone: [phone] Fax: [phone]     Social Drivers of Health (SDOH) Social History: SDOH Screenings   Food Insecurity: Unknown (02/05/2024)  Housing: Unknown (02/05/2024)  Transportation Needs: No Transportation Needs (02/05/2024)  Utilities: Not At Risk (02/05/2024)  Social Connections: Moderately Isolated (02/05/2024)  Tobacco Use: Medium Risk (02/05/2024)   SDOH Interventions:     Readmission Risk Interventions     No data to display

## 2024-02-07 DIAGNOSIS — N179 Acute kidney failure, unspecified: Secondary | ICD-10-CM | POA: Diagnosis not present

## 2024-02-07 DIAGNOSIS — I5043 Acute on chronic combined systolic (congestive) and diastolic (congestive) heart failure: Secondary | ICD-10-CM | POA: Diagnosis not present

## 2024-02-07 DIAGNOSIS — N1831 Chronic kidney disease, stage 3a: Secondary | ICD-10-CM | POA: Diagnosis not present

## 2024-02-07 DIAGNOSIS — R262 Difficulty in walking, not elsewhere classified: Secondary | ICD-10-CM | POA: Diagnosis not present

## 2024-02-07 LAB — GLUCOSE, CAPILLARY
Glucose-Capillary: 194 mg/dL — ABNORMAL HIGH (ref 70–99)
Glucose-Capillary: 289 mg/dL — ABNORMAL HIGH (ref 70–99)
Glucose-Capillary: 228 mg/dL — ABNORMAL HIGH (ref 70–99)
Glucose-Capillary: 223 mg/dL — ABNORMAL HIGH (ref 70–99)

## 2024-02-07 LAB — BASIC METABOLIC PANEL
Anion gap: 16 — ABNORMAL HIGH (ref 5–15)
BUN: 51 mg/dL — ABNORMAL HIGH (ref 8–23)
CO2: 18 mmol/L — ABNORMAL LOW (ref 22–32)
Calcium: 8.7 mg/dL — ABNORMAL LOW (ref 8.9–10.3)
Chloride: 100 mmol/L (ref 98–111)
Creatinine, Ser: 2.09 mg/dL — ABNORMAL HIGH (ref 0.61–1.24)
GFR, Estimated: 31 mL/min — ABNORMAL LOW (ref >60)
Glucose, Bld: 189 mg/dL — ABNORMAL HIGH (ref 70–99)
Potassium: 4.4 mmol/L (ref 3.5–5.1)
Sodium: 134 mmol/L — ABNORMAL LOW (ref 135–145)

## 2024-02-07 LAB — CBC
HCT: 44 % (ref 39.0–52.0)
Hemoglobin: 14.3 g/dL (ref 13.0–17.0)
MCH: 30 pg (ref 26.0–34.0)
MCHC: 32.5 g/dL (ref 30.0–36.0)
MCV: 92.4 fL (ref 80.0–100.0)
Platelets: 132 10*3/uL — ABNORMAL LOW (ref 150–400)
RBC: 4.76 MIL/uL (ref 4.22–5.81)
RDW: 15.3 % (ref 11.5–15.5)
WBC: 7.5 10*3/uL (ref 4.0–10.5)
nRBC: 0 % (ref 0.0–0.2)

## 2024-02-07 NOTE — TOC Progression Note (Signed)
 Transition of Care [facility]) - Progression Note    Patient Details  Name: Steven Newman MRN: [id number] Date of Birth: 03-Mar-1941  [facility] CM/SW Contact  Leone Haven, RN Phone Number: 02/07/2024, 4:45 PM  Clinical Narrative:    NCM notified patient has decided to go home with HHPT, NCM offered choice, wife chose [facility],  NCM made referral to [facility] with [facility], he is able to take referral , soc will begin 24 to  48 hrs post dc.   He states he does not need a bsc at this time.    Expected Discharge Plan:  (CIR vs SNF) Barriers to Discharge: Continued Medical Work up, SNF Pending bed offer  Expected Discharge Plan and Services In-house Referral: Clinical Social Work     Living arrangements for the past 2 months: Single Family Home                                       Social Determinants of Health (SDOH) Interventions SDOH Screenings   Food Insecurity: Unknown (02/05/2024)  Housing: Unknown (02/05/2024)  Transportation Needs: No Transportation Needs (02/05/2024)  Utilities: Not At Risk (02/05/2024)  Social Connections: Moderately Isolated (02/05/2024)  Tobacco Use: Medium Risk (02/05/2024)    Readmission Risk Interventions     No data to display

## 2024-02-07 NOTE — Progress Notes (Addendum)
 [facility] PROGRESS NOTE   Steven Newman ZOX:[id number] DOB: 03/23/1941 DOA: 02/04/2024  PCP: Lorenda Ishihara, MD  Brief History: 83 y.o. 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  He also mentioned bilateral lower extremity pain left greater than right worse with ambulation.  Concern was for peripheral artery disease.  Patient was hospitalized for further management    Consultants: Vascular surgery.  Cardiology.  Orthopedics  Procedures: Transthoracic echocardiogram.  Intra-articular injection to the left knee    Subjective/Interval History: Patient mentioned that his left knee feels much better after the injection yesterday.  Denies any shortness of breath.  No chest pain.  Looking forward to ambulating today.     Assessment/Plan:  Ambulatory dysfunction/concern for claudication/left-sided knee pain  CT angiogram raise concern for multiple vascular issues and the abdomen as well as in the lower extremities. 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  Near complete occlusion of the proximal left CFA and left profunda femoris.  Severe stenosis of the proximal left SFA was also noted.  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 These findings were discussed with vascular surgery who evaluated the patient.  They did not feel that vascular compromise is causing his symptoms. Discussed with patient's wife to get additional history as history from patient was  inadequate. Patient has had knee pain for a long time. No acute worsening in the pain per wife. No recent fall on the knee in the last week or so.  Based on information even though it was possible that arthritis could be contributing but we needed to rule out spinal issues.  Patient subsequently underwent MRI cervical thoracic lumbar spine which did not show any significant spinal stenosis.  Chronic changes were noted. Subsequently orthopedics was consulted and underwent intra-articular injection to the left knee yesterday.  Patient's symptoms have significantly improved overnight.  Uric acid level noted to be 6.1.  Continue aspirin and statin.  LDL is 43. Do not anticipate any further workup at this time.  Will benefit from reevaluation by physical and Occupational Therapy.  Acute on chronic systolic and diastolic CHF Echocardiogram from October 2022 showed EF of 40 to 45% with grade 2 diastolic dysfunction. Echo was repeated and shows worsening systolic function with EF of 35 to 40%.  Grade 1 diastolic dysfunction was noted. Patient was given IV furosemide.  Worsening creatinine noted today.  He did diurese well yesterday. Noted to be on Jardiance metoprolol statin.  Nadolol has been changed over to metoprolol. Not on any ACE inhibitor or ARB. Currently off of diuretics.  Acute on chronic kidney disease stage IIIa/metabolic acidosis No recent labs in our system.  Creatinine was 1.3 in 2022.  Care everywhere also reviewed. Came in with creatinine of 1.66.  Creatinine noted to be 2.09 today.  Could be due to furosemide that was given yesterday.  Currently on hold.  Will recheck labs tomorrow.  Monitor urine output.  Avoid nephrotoxic agents.   ADDENDUM Wife mentioned that patient is closely followed by nephrology, Dr. Melanee Spry.  Discussed with  on-call nephrologist who was able to look up recent lab work.  His creatinine was 1.49 in January and then 1.45 in 2024.  So it does appear that the worsening  in his creatinine was most likely due to overdiuresis.  Hopefully it will start plateauing off of diuretics.   History of coronary artery disease He has had MI and PCI previously.  Continue aspirin.  Apparently had not been taking his statin which has been resumed.  Diabetes mellitus type 2 Home medication list does not show that he is on any diabetic medications.  SSI.  Monitor CBGs.  HbA1c 6.9.  History of COPD Stable.  Continue inhalers.  Skin rash versus skin cancer Apparently seen by dermatology recently and was prescribed cephalexin and triamcinolone which will be continued.  Outpatient follow-up with dermatology.  Mild infrarenal AAA Outpatient monitoring.  BPH No clinical symptoms currently.  Outpatient management.  Continue mirabegron.  Colonic diverticulosis No evidence for diverticulitis.  Avoid constipation.  History of bladder cancer Outpatient monitoring.  DVT Prophylaxis: Subcutaneous heparin Code Status: Full code Family Communication: Discussed with patient.  Discussed with his wife over the phone yesterday. Disposition Plan: Initially CIR was recommended.  However patient's functional status appears to have improved significantly in the last 24 hours.  Will benefit from being reevaluated by PT and OT.     Medications: Scheduled:  aspirin EC  81 mg Oral Daily   cephALEXin  500 mg Oral TID   empagliflozin  25 mg Oral Daily   heparin  5,000 Units Subcutaneous Q8H   insulin aspart  0-9 Units Subcutaneous TID WC   metoprolol succinate  25 mg Oral Daily   mirabegron ER  25 mg Oral Daily   rosuvastatin  40 mg Oral Daily   sodium chloride flush  3 mL Intravenous Q12H   triamcinolone cream  1 Application Topical BID   umeclidinium-vilanterol  1 puff Inhalation Daily   Continuous:   ZOX:[id number] **OR** acetaminophen, ipratropium-albuterol, ondansetron **OR** ondansetron (ZOFRAN) IV, sodium chloride flush  Antibiotics: Anti-infectives (From admission,  onward)    Start     Dose/Rate Route Frequency Ordered Stop   02/05/24 1030  cephALEXin (KEFLEX) capsule 500 mg        500 mg Oral 3 times daily 02/05/24 0936 02/10/24 0959       Objective:  Vital Signs  Vitals:   02/07/24 0408 02/07/24 0750 02/07/24 0836 02/07/24 0929  BP: 134/87 (!) 146/69 134/69   Pulse: 66 66 66   Resp: 18 18 16    Temp: 97.9 F (36.6 C) 98.5 F (36.9 C)    TempSrc: Oral Oral    SpO2: 95% 95% 95% 94%  Weight: 87.3 kg     Height:        Intake/Output Summary (Last 24 hours) at 02/07/2024 1023 Last data filed at 02/07/2024 0800 Gross per 24 hour  Intake 300 ml  Output 2450 ml  Net -2150 ml   Filed Weights   02/05/24 0142 02/06/24 0500 02/07/24 0408  Weight: 89.5 kg 88.5 kg 87.3 kg    General appearance: Awake alert.  In no distress Resp: Clear to auscultation bilaterally.  Normal effort Cardio: S1-S2 is normal regular.  No S3-S4.  No rubs murmurs or bruit GI: Abdomen is soft.  Nontender nondistended.  Bowel sounds are present normal.  No masses organomegaly Extremities: No edema.  Full range of motion of lower extremities. Neurologic: Alert and oriented x3.  No focal neurological deficits.     Lab  Results:  Data Reviewed: I have personally reviewed following labs and reports of the imaging studies  CBC: Recent Labs  Lab 02/04/24 1423 02/05/24 0237 02/06/24 0304 02/07/24 0328  WBC 8.4 9.6 9.2 7.5  NEUTROABS 6.1  --   --   --   HGB 14.9 14.8 14.3 14.3  HCT 46.2 46.6 43.0 44.0  MCV 93.7 94.5 91.1 92.4  PLT 152 138* 130* 132*    Basic Metabolic Panel: Recent Labs  Lab 02/04/24 1423 02/05/24 0237 02/06/24 0304 02/07/24 0328  NA 135 138 135 134*  K 4.8 3.9 4.0 4.4  CL 105 105 99 100  CO2 22 15* 20* 18*  GLUCOSE 154* 145* 145* 189*  BUN 37* 32* 36* 51*  CREATININE 1.66* 1.56* 1.71* 2.09*  CALCIUM 8.9 9.0 8.8* 8.7*    GFR: Estimated Creatinine Clearance: 29.1 mL/min (A) (by C-G formula based on SCr of 2.09 mg/dL (H)).  Liver  Function Tests: Recent Labs  Lab 02/04/24 1423 02/05/24 0237  AST 35 21  ALT 46* 36  ALKPHOS 56 43  BILITOT 0.6 1.2  PROT 7.8 6.9  ALBUMIN 3.4* 2.8*     HbA1C: Recent Labs    02/05/24 0237  HGBA1C 6.9*    CBG: Recent Labs  Lab 02/06/24 0502 02/06/24 1115 02/06/24 1545 02/06/24 2039 02/07/24 0503  GLUCAP 146* 173* 168* 189* 194*    Lipid Profile: Recent Labs    02/05/24 0237  CHOL 113  HDL 51  LDLCALC 43  TRIG 93  CHOLHDL 2.2     Recent Results (from the past 240 hours)  Resp panel by RT-PCR (RSV, Flu A&B, Covid) Anterior Nasal Swab     Status: None   Collection Time: 02/04/24  2:23 PM   Specimen: Anterior Nasal Swab  Result Value Ref Range Status   SARS Coronavirus 2 by RT PCR NEGATIVE NEGATIVE Final    Comment: (NOTE) SARS-CoV-2 target nucleic acids are NOT DETECTED.  The SARS-CoV-2 RNA is generally detectable in upper respiratory specimens during the acute phase of infection. The lowest concentration of SARS-CoV-2 viral copies this assay can detect is 138 copies/mL. A negative result does not preclude SARS-Cov-2 infection and should not be used as the sole basis for treatment or other patient management decisions. A negative result may occur with  improper specimen collection/handling, submission of specimen other than nasopharyngeal swab, presence of viral mutation(s) within the areas targeted by this assay, and inadequate number of viral copies(<138 copies/mL). A negative result must be combined with clinical observations, patient history, and epidemiological information. The expected result is Negative.  Fact Sheet for Patients:  BloggerCourse.com  Fact Sheet for Healthcare Providers:  SeriousBroker.it  This test is no t yet approved or cleared by the [place] FDA and  has been authorized for detection and/or diagnosis of SARS-CoV-2 by FDA under an Emergency Use Authorization (EUA).  This EUA will remain  in effect (meaning this test can be used) for the duration of the COVID-19 declaration under Section 564(b)(1) of the Act, 21 U.S.C.section 360bbb-3(b)(1), unless the authorization is terminated  or revoked sooner.       Influenza A by PCR NEGATIVE NEGATIVE Final   Influenza B by PCR NEGATIVE NEGATIVE Final    Comment: (NOTE) The Xpert Xpress SARS-CoV-2/FLU/RSV plus assay is intended as an aid in the diagnosis of influenza from Nasopharyngeal swab specimens and should not be used as a sole basis for treatment. Nasal washings and aspirates are unacceptable for Xpert Xpress SARS-CoV-2/FLU/RSV testing.  Fact Sheet for Patients: BloggerCourse.com  Fact Sheet for Healthcare Providers: SeriousBroker.it  This test is not yet approved or cleared by the [place] FDA and has been authorized for detection and/or diagnosis of SARS-CoV-2 by FDA under an Emergency Use Authorization (EUA). This EUA will remain in effect (meaning this test can be used) for the duration of the COVID-19 declaration under Section 564(b)(1) of the Act, 21 U.S.C. section 360bbb-3(b)(1), unless the authorization is terminated or revoked.     Resp Syncytial Virus by PCR NEGATIVE NEGATIVE Final    Comment: (NOTE) Fact Sheet for Patients: BloggerCourse.com  Fact Sheet for Healthcare Providers: SeriousBroker.it  This test is not yet approved or cleared by the [place] FDA and has been authorized for detection and/or diagnosis of SARS-CoV-2 by FDA under an Emergency Use Authorization (EUA). This EUA will remain in effect (meaning this test can be used) for the duration of the COVID-19 declaration under Section 564(b)(1) of the Act, 21 U.S.C. section 360bbb-3(b)(1), unless the authorization is terminated or revoked.  Performed at [facility], [address],  [place], [place] [address]       Radiology Studies: MR LUMBAR SPINE WO CONTRAST Result Date: 02/06/2024 CLINICAL DATA:  Weakness and ataxia. EXAM: MRI LUMBAR SPINE WITHOUT CONTRAST TECHNIQUE: Multiplanar, multisequence MR imaging of the lumbar spine was performed. No intravenous contrast was administered. COMPARISON:  CT chest, abdomen, and pelvis dated February 04, 2024. FINDINGS: Segmentation:  Standard. Alignment:  Physiologic. Vertebrae:  No fracture, evidence of discitis, or bone lesion. Conus medullaris and cauda equina: Conus extends to the L1 level. Conus and cauda equina appear normal. Paraspinal and other soft tissues: Bilateral renal simple cysts. No follow-up imaging is recommended. Disc levels: T12-L1:  No significant disc bulge or herniation.  No stenosis. L1-L2: Mild disc bulging and bilateral facet arthropathy. No stenosis. L2-L3: Mild-to-moderate disc bulging. Mild right-greater-than-left lateral recess and neuroforaminal stenosis. No spinal canal stenosis. L3-L4: Mild-to-moderate disc bulging and mild bilateral facet arthropathy. Mild to moderate bilateral neuroforaminal stenosis. No spinal canal stenosis. L4-L5: Mild-to-moderate disc bulging with superimposed small central disc protrusion. Mild bilateral facet arthropathy. Small right-sided synovial cyst (series 16, image 35) without significant mass effect. Mild to moderate bilateral lateral recess and neuroforaminal stenosis. No spinal canal stenosis. L5-S1: Small broad-based posterior disc protrusion with right subarticular annular fissure. Mild bilateral facet arthropathy. Mild right lateral recess stenosis. No spinal canal or neuroforaminal stenosis. IMPRESSION: 1. Multilevel lumbar spondylosis as described above. Mild to moderate bilateral neuroforaminal stenosis at L3-L4 and L4-L5. Electronically Signed   By: Obie Dredge M.D.   On: 02/06/2024 15:03   MR THORACIC SPINE WO CONTRAST Result Date: 02/06/2024 CLINICAL DATA:  Initial evaluation  for ataxia, thoracic pathology suspected. EXAM: MRI THORACIC SPINE WITHOUT CONTRAST TECHNIQUE: Multiplanar, multisequence MR imaging of the thoracic spine was performed. No intravenous contrast was administered. COMPARISON:  None Available. FINDINGS: Alignment: Mild dextroscoliosis. Alignment otherwise normal with preservation of the normal thoracic kyphosis. No listhesis. Vertebrae: Vertebral body height maintained without acute or chronic fracture. Bone marrow signal intensity within normal limits. No worrisome osseous lesions or abnormal marrow edema. Cord:  Normal signal and morphology. Paraspinal and other soft tissues: Paraspinous soft tissues demonstrate no acute finding. Multiple T2 hyperintense renal cysts noted, benign in appearance, no follow-up imaging recommended. Disc levels: Ordinary for age multilevel disc desiccation noted throughout the thoracic spine. Mild degenerative endplate spurring noted at T8-9 through T12-L1. Mild multilevel facet hypertrophy. No significant spinal stenosis. Foramina  remain patent. No overt neural impingement. IMPRESSION: 1. Normal MRI appearance of the thoracic spinal cord. No cord signal changes to suggest myelopathy. 2. Mild for age thoracic spondylosis without significant stenosis or overt neural impingement. Electronically Signed   By: Rise Mu M.D.   On: 02/06/2024 02:35   MR CERVICAL SPINE WO CONTRAST Result Date: 02/06/2024 CLINICAL DATA:  Initial evaluation for ataxia, cervical pathology suspected. EXAM: MRI CERVICAL SPINE WITHOUT CONTRAST TECHNIQUE: Multiplanar, multisequence MR imaging of the cervical spine was performed. No intravenous contrast was administered. COMPARISON:  None Available. FINDINGS: Alignment: Examination degraded by motion artifact. Straightening of the normal cervical lordosis.  No listhesis. Vertebrae: Vertebral body height maintained without acute or chronic fracture. Bone marrow signal intensity within normal limits. No  worrisome osseous lesions. No abnormal marrow edema. Cord: Normal signal and morphology. Posterior Fossa, vertebral arteries, paraspinal tissues: Unremarkable. Disc levels: C2-C3: Mild left-sided uncovertebral spurring without significant disc bulge. Left-sided facet degeneration with bony ankylosis. No spinal stenosis. Mild left C3 foraminal narrowing. Right neural foramen remains patent. C3-C4: Degenerative vertebral disc space narrowing with diffuse disc osteophyte complex. Posterior component flattens and partially faces the ventral thecal sac. Mild to moderate left greater than right facet hypertrophy. No significant spinal stenosis. Severe left with moderate right C4 foraminal narrowing. C4-C5: Mild right-sided uncovertebral spurring without significant disc bulge. Moderate right-sided facet arthrosis. No spinal stenosis. Severe right C5 foraminal narrowing. Left neural foramen remains patent. C5-C6: Minimal disc bulge with uncovertebral spurring. Moderate left with mild right facet hypertrophy. No spinal stenosis. Severe left with moderate right C6 foraminal narrowing. C6-C7: Mild disc bulge with uncovertebral spurring. Mild bilateral facet hypertrophy. No significant spinal stenosis. Mild to moderate bilateral C7 foraminal narrowing, slightly worse on the right. C7-T1: Negative interspace. Mild facet hypertrophy. No canal or foraminal stenosis. IMPRESSION: 1. Normal MRI appearance of the cervical spinal cord. No cord signal changes to suggest myelopathy or significant spinal stenosis. 2. Multilevel cervical spondylosis with associated moderate to severe bilateral C4 through C7 foraminal narrowing as above. 3. Moderate multilevel facet hypertrophy as above. Findings could contribute to underlying neck pain. Electronically Signed   By: Rise Mu M.D.   On: 02/06/2024 02:29   VAS [place] ABI WITH/WO TBI Result Date: 02/05/2024  LOWER EXTREMITY DOPPLER STUDY Patient Name:  JAMANI BEARCE  Date of Exam:    02/05/2024 Medical Rec #: [id number]     Accession #:    [id number] Date of Birth: November 12, 1941     Patient Gender: M Patient Age:   74 years Exam Location:  [facility] Procedure:      VAS [place] ABI WITH/WO TBI Referring Phys: Tereasa Coop --------------------------------------------------------------------------------  Indications: Peripheral artery disease. (patient followed by Dr. Kennith Maes in the              past) CTA 02/04/2024, indicated severe stenosis of the proximal SMA,              near occlusion at the origin of the left main renal artery, near              occlusion of proximal left femoral artery and the left profunda              artery, severe stenosis of the proximal left SFA, distal right CIA              aneurysm, moderate to severe stenosis at the origin of the right  internal iliac artery, narrowing of the right external iliac              artery, possible nonflow limiting focal dissection of the right              CFA. High Risk Factors: Hypertension, hyperlipidemia, Diabetes, prior MI, coronary                    artery disease. Other Factors: CKD IIIa, AAA, COPD, CHF.  Vascular Interventions: No vascular interventions. Comparison Study: Prior ABI done 02/01/2023 at [facility] Performing Technologist: Sherren Kerns RVS  Examination Guidelines: A complete evaluation includes at minimum, Doppler waveform signals and systolic blood pressure reading at the level of bilateral brachial, anterior tibial, and posterior tibial arteries, when vessel segments are accessible. Bilateral testing is considered an integral part of a complete examination. Photoelectric Plethysmograph (PPG) waveforms and toe systolic pressure readings are included as required and additional duplex testing as needed. Limited examinations for reoccurring indications may be performed as noted.  ABI Findings: +---------+------------------+-----+---------+--------+ Right    Rt Pressure (mmHg)IndexWaveform Comment   +---------+------------------+-----+---------+--------+ Brachial 124                    triphasic         +---------+------------------+-----+---------+--------+ PTA      86                0.69 biphasic          +---------+------------------+-----+---------+--------+ DP       86                0.69 biphasic          +---------+------------------+-----+---------+--------+ Great Toe65                0.52 Abnormal          +---------+------------------+-----+---------+--------+ +---------+------------------+-----+---------+-------+ Left     Lt Pressure (mmHg)IndexWaveform Comment +---------+------------------+-----+---------+-------+ Brachial 119                    triphasic        +---------+------------------+-----+---------+-------+ PTA      85                0.69 biphasic         +---------+------------------+-----+---------+-------+ DP       80                0.65 biphasic         +---------+------------------+-----+---------+-------+ Great Toe90                0.73 Abnormal         +---------+------------------+-----+---------+-------+ +-------+-----------+-----------+------------+------------+ ABI/TBIToday's ABIToday's TBIPrevious ABIPrevious TBI +-------+-----------+-----------+------------+------------+ Right  0.69       0.52       0.66        0.46         +-------+-----------+-----------+------------+------------+ Left   0.69       0.73       0.84        0.67         +-------+-----------+-----------+------------+------------+ Right ABIs and bilateral TBIs appear essentially unchanged compared to prior study on 02/01/2023. Left ABIs appear decreased compared to prior study on 02/01/23.  Summary: Right: Resting right ankle-brachial index indicates moderate right lower extremity arterial disease. The right toe-brachial index is abnormal. Left: Resting left ankle-brachial index indicates moderate left lower extremity arterial disease. The left  toe-brachial index is abnormal. *See table(s) above for measurements and  observations.  Electronically signed by Coral Else MD on 02/05/2024 at 9:27:15 PM.    Final    ECHOCARDIOGRAM COMPLETE Result Date: 02/05/2024    ECHOCARDIOGRAM REPORT   Patient Name:   OWENS HARA Date of Exam: 02/05/2024 Medical Rec #:  [id number]    Height:       69.5 in Accession #:    [id number]   Weight:       197.3 lb Date of Birth:  [birth year]    BSA:          2.064 m Patient Age:    82 years     BP:           116/72 mmHg Patient Gender: M            HR:           80 bpm. Exam Location:  Inpatient Procedure: 2D Echo, Cardiac Doppler and Color Doppler (Both Spectral and Color            Flow Doppler were utilized during procedure). Indications:     CHF-Acute Diastolic I50.31, CHF- Acute Systolic I50.21  History:         Patient has prior history of Echocardiogram examinations, most                  recent 09/11/2021. CHF, Previous Myocardial Infarction, PAD,                  COPD and CKD, stage 3; Risk Factors:Hypertension, Diabetes,                  Dyslipidemia and Sleep Apnea.  Sonographer:     Lucendia Herrlich RCS Referring Phys:  [id number] SUBRINA SUNDIL Diagnosing Phys: Epifanio Lesches MD IMPRESSIONS  1. Technically difficult study, consider limited echo with contrast to better evaluate LV systolic function. Left ventricular ejection fraction, by estimation, is 35 to 40%. The left ventricle has moderately decreased function. The left ventricle demonstrates regional wall motion abnormalities (see scoring diagram/findings for description). There is moderate asymmetric left ventricular hypertrophy of the basal-septal segment. Left ventricular diastolic parameters are consistent with Grade I diastolic dysfunction (impaired relaxation).  2. Right ventricular systolic function is normal. The right ventricular size is not well visualized. Tricuspid regurgitation signal is inadequate for assessing PA pressure.  3. The mitral valve is  normal in structure. Trivial mitral valve regurgitation.  4. The aortic valve was not well visualized. Aortic valve regurgitation is trivial. No aortic stenosis is present.  5. Aortic dilatation noted. 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  6. The inferior vena cava is normal in size with greater than 50% respiratory variability, suggesting right atrial pressure of 3 mmHg. FINDINGS  Left Ventricle: Left ventricular ejection fraction, by estimation, is 35 to 40%. The left ventricle has moderately decreased function. The left ventricle demonstrates regional wall motion abnormalities. Strain imaging was not performed. The left ventricular internal cavity size was normal in size. There is moderate asymmetric left ventricular hypertrophy of the basal-septal segment. Left ventricular diastolic parameters are consistent with Grade I diastolic dysfunction (impaired relaxation).  LV Wall Scoring: The entire inferior wall, posterior wall, basal inferoseptal segment, and apex are akinetic. The entire anterior wall, antero-lateral wall, entire anterior septum, apical lateral segment, and mid inferoseptal segment are normal. Right Ventricle: The right ventricular size is not well visualized. No increase in right ventricular wall thickness. Right ventricular  systolic function is normal. Tricuspid regurgitation signal is inadequate for assessing PA pressure. Left Atrium: Left atrial size was normal in size. Right Atrium: Right atrial size was normal in size. Pericardium: Trivial pericardial effusion is present. Presence of epicardial fat layer. Mitral Valve: The mitral valve is normal in structure. Trivial mitral valve regurgitation. Tricuspid Valve: The tricuspid valve is normal in structure. Tricuspid valve regurgitation is trivial. Aortic Valve: The aortic valve was not well visualized. Aortic valve regurgitation is trivial. No aortic stenosis is  present. Aortic valve peak gradient measures 3.8 mmHg. Pulmonic Valve: The pulmonic valve was not well visualized. Pulmonic valve regurgitation is not visualized. Aorta: Aortic dilatation noted. 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 Venous: The inferior vena cava is normal in size with greater than 50% respiratory variability, suggesting right atrial pressure of 3 mmHg. IAS/Shunts: The interatrial septum was not well visualized. Additional Comments: 3D imaging was not performed.  LEFT VENTRICLE PLAX 2D LVIDd:         4.80 cm      Diastology LVIDs:         4.00 cm      LV e' medial:    3.48 cm/s LV PW:         1.30 cm      LV E/e' medial:  11.2 LV IVS:        0.90 cm      LV e' lateral:   6.96 cm/s LVOT diam:     2.30 cm      LV E/e' lateral: 5.6 LV SV:         51 LV SV Index:   25 LVOT Area:     4.15 cm  LV Volumes (MOD) LV vol d, MOD A2C: 129.0 ml LV vol d, MOD A4C: 160.0 ml LV vol s, MOD A2C: 88.3 ml LV vol s, MOD A4C: 107.0 ml LV SV MOD A2C:     40.7 ml LV SV MOD A4C:     160.0 ml LV SV MOD BP:      46.0 ml RIGHT VENTRICLE             IVC RV S prime:     12.60 cm/s  IVC diam: 1.00 cm LEFT ATRIUM             Index        RIGHT ATRIUM           Index LA diam:        3.90 cm 1.89 cm/m   RA Area:     13.70 cm LA Vol (A2C):   32.4 ml 15.67 ml/m  RA Volume:   31.30 ml  15.16 ml/m LA Vol (A4C):   33.5 ml 16.23 ml/m LA Biplane Vol: 33.6 ml 16.28 ml/m  AORTIC VALVE AV Area (Vmax): 3.77 cm AV Vmax:        97.10 cm/s AV Peak Grad:   3.8 mmHg LVOT Vmax:      88.02 cm/s LVOT Vmean:     53.275 cm/s LVOT VTI:       0.123 m  AORTA Ao Root diam: 4.20 cm Ao Asc diam:  4.00 cm MITRAL VALVE MV Area (PHT): 3.65 cm    SHUNTS MV Decel Time: 208 msec    Systemic VTI:  0.12 m MV E velocity: 39.00 cm/s  Systemic Diam: 2.30 cm MV A velocity: 68.50 cm/s MV E/A ratio:  0.57  Epifanio Lesches MD Electronically signed by Epifanio Lesches MD  Signature Date/Time: 02/05/2024/6:36:38 PM    Final (Updated)    DG Knee Left Port Result Date: 02/05/2024 CLINICAL DATA:  Pain EXAM: PORTABLE PELVIS 1-2 VIEWS; PORTABLE LEFT KNEE - 1-2 VIEW; PORTABLE RIGHT KNEE - 1-2 VIEW COMPARISON:  CT angiography chest abdomen pelvis 02/04/2024 FINDINGS: Pelvis: No fracture or dislocation. Extensive Atherosclerotic changes seen throughout visualized arterial segments. Moderate bilateral hip osteoarthrosis. Right knee: No fracture or dislocation. 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 Moderate knee joint effusion. 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 No fracture or dislocation. Calcific densities in the distal femur favored to be a benign chondroid lesion. Small knee joint effusion. IMPRESSION: 1. No acute fracture or dislocation of the pelvis or knees. 2. Moderate right and small left knee joint effusions. 3. Moderate bilateral hip osteoarthrosis. Electronically Signed   By: Acquanetta Belling M.D.   On: 02/05/2024 17:42   DG Knee Right Port Result Date: 02/05/2024 CLINICAL DATA:  Pain EXAM: PORTABLE PELVIS 1-2 VIEWS; PORTABLE LEFT KNEE - 1-2 VIEW; PORTABLE RIGHT KNEE - 1-2 VIEW COMPARISON:  CT angiography chest abdomen pelvis 02/04/2024 FINDINGS: Pelvis: No fracture or dislocation. Extensive Atherosclerotic changes seen throughout visualized arterial segments. Moderate bilateral hip osteoarthrosis. Right knee: No fracture or dislocation. 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 Moderate knee joint effusion. 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 No fracture or dislocation. Calcific densities in the distal femur favored to be a benign chondroid lesion. Small knee joint effusion. IMPRESSION: 1. No acute fracture or dislocation of the pelvis or knees. 2. Moderate right and small left knee joint effusions. 3. Moderate bilateral hip osteoarthrosis. Electronically Signed   By: Acquanetta Belling  M.D.   On: 02/05/2024 17:42   DG Pelvis Portable Result Date: 02/05/2024 CLINICAL DATA:  Pain EXAM: PORTABLE PELVIS 1-2 VIEWS; PORTABLE LEFT KNEE - 1-2 VIEW; PORTABLE RIGHT KNEE - 1-2 VIEW COMPARISON:  CT angiography chest abdomen pelvis 02/04/2024 FINDINGS: Pelvis: No fracture or dislocation. Extensive Atherosclerotic changes seen throughout visualized arterial segments. Moderate bilateral hip osteoarthrosis. Right knee: No fracture or dislocation. 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 Moderate knee joint effusion. 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 No fracture or dislocation. Calcific densities in the distal femur favored to be a benign chondroid lesion. Small knee joint effusion. IMPRESSION: 1. No acute fracture or dislocation of the pelvis or knees. 2. Moderate right and small left knee joint effusions. 3. Moderate bilateral hip osteoarthrosis. Electronically Signed   By: Acquanetta Belling M.D.   On: 02/05/2024 17:42       LOS: 0 days   Briteny Fulghum Rito Ehrlich  [facility] Pager on [email or web address]  02/07/2024, 10:23 AM

## 2024-02-07 NOTE — Progress Notes (Signed)
 Inpatient Rehabilitation Admissions Coordinator   Noted therapy recommendations are for [facility]. No CIR needs.  Ottie Glazier, RN, MSN Rehab Admissions Coordinator [phone] 02/07/2024 2:00 PM

## 2024-02-07 NOTE — Progress Notes (Signed)
 Rounding Note    Patient Name: Steven Newman Date of Encounter: 02/07/2024  [facility] Cardiologist: Lance Muss, MD  Subjective   Notes improvement in leg pain/weakness after steroid knee injection. He was able to walk around and even take steps without any pain. Denied any associated claudication.  Denies any chest pain or SOB. He slept well last night without any complaints. He reports okay urine output but think output would improve if condom urine cath was removed.   Inpatient Medications    Scheduled Meds:  aspirin EC  81 mg Oral Daily   cephALEXin  500 mg Oral TID   empagliflozin  25 mg Oral Daily   heparin  5,000 Units Subcutaneous Q8H   insulin aspart  0-9 Units Subcutaneous TID WC   metoprolol succinate  25 mg Oral Daily   mirabegron ER  25 mg Oral Daily   rosuvastatin  40 mg Oral Daily   sodium chloride flush  3 mL Intravenous Q12H   triamcinolone cream  1 Application Topical BID   umeclidinium-vilanterol  1 puff Inhalation Daily   Continuous Infusions:  PRN Meds: acetaminophen **OR** acetaminophen, ipratropium-albuterol, ondansetron **OR** ondansetron (ZOFRAN) IV, sodium chloride flush   Vital Signs    Vitals:   02/07/24 0408 02/07/24 0750 02/07/24 0836 02/07/24 0929  BP: 134/87 (!) 146/69 134/69   Pulse: 66 66 66   Resp: 18 18 16    Temp: 97.9 F (36.6 C) 98.5 F (36.9 C)    TempSrc: Oral Oral    SpO2: 95% 95% 95% 94%  Weight: 87.3 kg     Height:        Intake/Output Summary (Last 24 hours) at 02/07/2024 1135 Last data filed at 02/07/2024 0836 Gross per 24 hour  Intake 300 ml  Output 2950 ml  Net -2650 ml      02/07/2024    4:08 AM 02/06/2024    5:00 AM 02/05/2024    1:42 AM  Last 3 Weights  Weight (lbs) 192 lb 7.4 oz 195 lb 1.7 oz 197 lb 5 oz  Weight (kg) 87.3 kg 88.5 kg 89.5 kg      Telemetry    NSR 60-70's - Personally Reviewed  ECG    None new - Personally Reviewed  Physical Exam   GEN: Sitting in chair with  any  acute distress. With wife at bedside.  Neck: No JVD Cardiac: RRR, no murmurs, rubs, or gallops.  Respiratory: Clear to auscultation bilaterally. GI: Soft, nontender, non-distended  MS: +1 pitting edema.  Neuro:  Nonfocal  Psych: Normal affect   Labs    High Sensitivity Troponin:   Recent Labs  Lab 02/04/24 1423 02/04/24 1730  TROPONINIHS 15 16     Chemistry Recent Labs  Lab 02/04/24 1423 02/05/24 0237 02/06/24 0304 02/07/24 0328  NA 135 138 135 134*  K 4.8 3.9 4.0 4.4  CL 105 105 99 100  CO2 22 15* 20* 18*  GLUCOSE 154* 145* 145* 189*  BUN 37* 32* 36* 51*  CREATININE 1.66* 1.56* 1.71* 2.09*  CALCIUM 8.9 9.0 8.8* 8.7*  PROT 7.8 6.9  --   --   ALBUMIN 3.4* 2.8*  --   --   AST 35 21  --   --   ALT 46* 36  --   --   ALKPHOS 56 43  --   --   BILITOT 0.6 1.2  --   --   GFRNONAA 40* 44* 39* 31*  ANIONGAP 8 18*  16* 16*    Lipids  Recent Labs  Lab 02/05/24 0237  CHOL 113  TRIG 93  HDL 51  LDLCALC 43  CHOLHDL 2.2    Hematology Recent Labs  Lab 02/05/24 0237 02/06/24 0304 02/07/24 0328  WBC 9.6 9.2 7.5  RBC 4.93 4.72 4.76  HGB 14.8 14.3 14.3  HCT 46.6 43.0 44.0  MCV 94.5 91.1 92.4  MCH 30.0 30.3 30.0  MCHC 31.8 33.3 32.5  RDW 15.5 15.5 15.3  PLT 138* 130* 132*   Thyroid  Recent Labs  Lab 02/06/24 0304  TSH 3.764    BNP Recent Labs  Lab 02/04/24 1423  BNP 896.6*    DDimer No results for input(s): "DDIMER" in the last 168 hours.   Radiology    MR LUMBAR SPINE WO CONTRAST Result Date: 02/06/2024 CLINICAL DATA:  Weakness and ataxia. EXAM: MRI LUMBAR SPINE WITHOUT CONTRAST TECHNIQUE: Multiplanar, multisequence MR imaging of the lumbar spine was performed. No intravenous contrast was administered. COMPARISON:  CT chest, abdomen, and pelvis dated February 04, 2024. FINDINGS: Segmentation:  Standard. Alignment:  Physiologic. Vertebrae:  No fracture, evidence of discitis, or bone lesion. Conus medullaris and cauda equina: Conus extends to the L1 level.  Conus and cauda equina appear normal. Paraspinal and other soft tissues: Bilateral renal simple cysts. No follow-up imaging is recommended. Disc levels: T12-L1:  No significant disc bulge or herniation.  No stenosis. L1-L2: Mild disc bulging and bilateral facet arthropathy. No stenosis. L2-L3: Mild-to-moderate disc bulging. Mild right-greater-than-left lateral recess and neuroforaminal stenosis. No spinal canal stenosis. L3-L4: Mild-to-moderate disc bulging and mild bilateral facet arthropathy. Mild to moderate bilateral neuroforaminal stenosis. No spinal canal stenosis. L4-L5: Mild-to-moderate disc bulging with superimposed small central disc protrusion. Mild bilateral facet arthropathy. Small right-sided synovial cyst (series 16, image 35) without significant mass effect. Mild to moderate bilateral lateral recess and neuroforaminal stenosis. No spinal canal stenosis. L5-S1: Small broad-based posterior disc protrusion with right subarticular annular fissure. Mild bilateral facet arthropathy. Mild right lateral recess stenosis. No spinal canal or neuroforaminal stenosis. IMPRESSION: 1. Multilevel lumbar spondylosis as described above. Mild to moderate bilateral neuroforaminal stenosis at L3-L4 and L4-L5. Electronically Signed   By: Obie Dredge M.D.   On: 02/06/2024 15:03   MR THORACIC SPINE WO CONTRAST Result Date: 02/06/2024 CLINICAL DATA:  Initial evaluation for ataxia, thoracic pathology suspected. EXAM: MRI THORACIC SPINE WITHOUT CONTRAST TECHNIQUE: Multiplanar, multisequence MR imaging of the thoracic spine was performed. No intravenous contrast was administered. COMPARISON:  None Available. FINDINGS: Alignment: Mild dextroscoliosis. Alignment otherwise normal with preservation of the normal thoracic kyphosis. No listhesis. Vertebrae: Vertebral body height maintained without acute or chronic fracture. Bone marrow signal intensity within normal limits. No worrisome osseous lesions or abnormal marrow edema.  Cord:  Normal signal and morphology. Paraspinal and other soft tissues: Paraspinous soft tissues demonstrate no acute finding. Multiple T2 hyperintense renal cysts noted, benign in appearance, no follow-up imaging recommended. Disc levels: Ordinary for age multilevel disc desiccation noted throughout the thoracic spine. Mild degenerative endplate spurring noted at T8-9 through T12-L1. Mild multilevel facet hypertrophy. No significant spinal stenosis. Foramina remain patent. No overt neural impingement. IMPRESSION: 1. Normal MRI appearance of the thoracic spinal cord. No cord signal changes to suggest myelopathy. 2. Mild for age thoracic spondylosis without significant stenosis or overt neural impingement. Electronically Signed   By: Rise Mu M.D.   On: 02/06/2024 02:35   MR CERVICAL SPINE WO CONTRAST Result Date: 02/06/2024 CLINICAL DATA:  Initial evaluation for ataxia,  cervical pathology suspected. EXAM: MRI CERVICAL SPINE WITHOUT CONTRAST TECHNIQUE: Multiplanar, multisequence MR imaging of the cervical spine was performed. No intravenous contrast was administered. COMPARISON:  None Available. FINDINGS: Alignment: Examination degraded by motion artifact. Straightening of the normal cervical lordosis.  No listhesis. Vertebrae: Vertebral body height maintained without acute or chronic fracture. Bone marrow signal intensity within normal limits. No worrisome osseous lesions. No abnormal marrow edema. Cord: Normal signal and morphology. Posterior Fossa, vertebral arteries, paraspinal tissues: Unremarkable. Disc levels: C2-C3: Mild left-sided uncovertebral spurring without significant disc bulge. Left-sided facet degeneration with bony ankylosis. No spinal stenosis. Mild left C3 foraminal narrowing. Right neural foramen remains patent. C3-C4: Degenerative vertebral disc space narrowing with diffuse disc osteophyte complex. Posterior component flattens and partially faces the ventral thecal sac. Mild to  moderate left greater than right facet hypertrophy. No significant spinal stenosis. Severe left with moderate right C4 foraminal narrowing. C4-C5: Mild right-sided uncovertebral spurring without significant disc bulge. Moderate right-sided facet arthrosis. No spinal stenosis. Severe right C5 foraminal narrowing. Left neural foramen remains patent. C5-C6: Minimal disc bulge with uncovertebral spurring. Moderate left with mild right facet hypertrophy. No spinal stenosis. Severe left with moderate right C6 foraminal narrowing. C6-C7: Mild disc bulge with uncovertebral spurring. Mild bilateral facet hypertrophy. No significant spinal stenosis. Mild to moderate bilateral C7 foraminal narrowing, slightly worse on the right. C7-T1: Negative interspace. Mild facet hypertrophy. No canal or foraminal stenosis. IMPRESSION: 1. Normal MRI appearance of the cervical spinal cord. No cord signal changes to suggest myelopathy or significant spinal stenosis. 2. Multilevel cervical spondylosis with associated moderate to severe bilateral C4 through C7 foraminal narrowing as above. 3. Moderate multilevel facet hypertrophy as above. Findings could contribute to underlying neck pain. Electronically Signed   By: Rise Mu M.D.   On: 02/06/2024 02:29   VAS [place] ABI WITH/WO TBI Result Date: 02/05/2024  LOWER EXTREMITY DOPPLER STUDY Patient Name:  ARIHANT PENNINGS  Date of Exam:   02/05/2024 Medical Rec #: [id number]     Accession #:    [id number] Date of Birth: Dec 09, 1940     Patient Gender: M Patient Age:   83 years Exam Location:  [facility] Procedure:      VAS [place] ABI WITH/WO TBI Referring Phys: Tereasa Coop --------------------------------------------------------------------------------  Indications: Peripheral artery disease. (patient followed by Dr. Kennith Maes in the              past) CTA 02/04/2024, indicated severe stenosis of the proximal SMA,              near occlusion at the origin of the left main renal artery, near               occlusion of proximal left femoral artery and the left profunda              artery, severe stenosis of the proximal left SFA, distal right CIA              aneurysm, moderate to severe stenosis at the origin of the right              internal iliac artery, narrowing of the right external iliac              artery, possible nonflow limiting focal dissection of the right              CFA. High Risk Factors: Hypertension, hyperlipidemia, Diabetes, prior MI, coronary  artery disease. Other Factors: CKD IIIa, AAA, COPD, CHF.  Vascular Interventions: No vascular interventions. Comparison Study: Prior ABI done 02/01/2023 at [facility] Performing Technologist: Sherren Kerns RVS  Examination Guidelines: A complete evaluation includes at minimum, Doppler waveform signals and systolic blood pressure reading at the level of bilateral brachial, anterior tibial, and posterior tibial arteries, when vessel segments are accessible. Bilateral testing is considered an integral part of a complete examination. Photoelectric Plethysmograph (PPG) waveforms and toe systolic pressure readings are included as required and additional duplex testing as needed. Limited examinations for reoccurring indications may be performed as noted.  ABI Findings: +---------+------------------+-----+---------+--------+ Right    Rt Pressure (mmHg)IndexWaveform Comment  +---------+------------------+-----+---------+--------+ Brachial 124                    triphasic         +---------+------------------+-----+---------+--------+ PTA      86                0.69 biphasic          +---------+------------------+-----+---------+--------+ DP       86                0.69 biphasic          +---------+------------------+-----+---------+--------+ Great Toe65                0.52 Abnormal          +---------+------------------+-----+---------+--------+ +---------+------------------+-----+---------+-------+ Left      Lt Pressure (mmHg)IndexWaveform Comment +---------+------------------+-----+---------+-------+ Brachial 119                    triphasic        +---------+------------------+-----+---------+-------+ PTA      85                0.69 biphasic         +---------+------------------+-----+---------+-------+ DP       80                0.65 biphasic         +---------+------------------+-----+---------+-------+ Great Toe90                0.73 Abnormal         +---------+------------------+-----+---------+-------+ +-------+-----------+-----------+------------+------------+ ABI/TBIToday's ABIToday's TBIPrevious ABIPrevious TBI +-------+-----------+-----------+------------+------------+ Right  0.69       0.52       0.66        0.46         +-------+-----------+-----------+------------+------------+ Left   0.69       0.73       0.84        0.67         +-------+-----------+-----------+------------+------------+ Right ABIs and bilateral TBIs appear essentially unchanged compared to prior study on 02/01/2023. Left ABIs appear decreased compared to prior study on 02/01/23.  Summary: Right: Resting right ankle-brachial index indicates moderate right lower extremity arterial disease. The right toe-brachial index is abnormal. Left: Resting left ankle-brachial index indicates moderate left lower extremity arterial disease. The left toe-brachial index is abnormal. *See table(s) above for measurements and observations.  Electronically signed by Coral Else MD on 02/05/2024 at 9:27:15 PM.    Final    ECHOCARDIOGRAM COMPLETE Result Date: 02/05/2024    ECHOCARDIOGRAM REPORT   Patient Name:   CALTON HARSHFIELD Date of Exam: 02/05/2024 Medical Rec #:  [id number]    Height:       69.5 in Accession #:    [id number]   Weight:  197.3 lb Date of Birth:  July 28, 1941    BSA:          2.064 m Patient Age:    82 years     BP:           116/72 mmHg Patient Gender: M            HR:           80 bpm. Exam  Location:  Inpatient Procedure: 2D Echo, Cardiac Doppler and Color Doppler (Both Spectral and Color            Flow Doppler were utilized during procedure). Indications:     CHF-Acute Diastolic I50.31, CHF- Acute Systolic I50.21  History:         Patient has prior history of Echocardiogram examinations, most                  recent 09/11/2021. CHF, Previous Myocardial Infarction, PAD,                  COPD and CKD, stage 3; Risk Factors:Hypertension, Diabetes,                  Dyslipidemia and Sleep Apnea.  Sonographer:     Lucendia Herrlich RCS Referring Phys:  [id number] SUBRINA SUNDIL Diagnosing Phys: Epifanio Lesches MD IMPRESSIONS  1. Technically difficult study, consider limited echo with contrast to better evaluate LV systolic function. Left ventricular ejection fraction, by estimation, is 35 to 40%. The left ventricle has moderately decreased function. The left ventricle demonstrates regional wall motion abnormalities (see scoring diagram/findings for description). There is moderate asymmetric left ventricular hypertrophy of the basal-septal segment. Left ventricular diastolic parameters are consistent with Grade I diastolic dysfunction (impaired relaxation).  2. Right ventricular systolic function is normal. The right ventricular size is not well visualized. Tricuspid regurgitation signal is inadequate for assessing PA pressure.  3. The mitral valve is normal in structure. Trivial mitral valve regurgitation.  4. The aortic valve was not well visualized. Aortic valve regurgitation is trivial. No aortic stenosis is present.  5. Aortic dilatation noted. 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  6. The inferior vena cava is normal in size with greater than 50% respiratory variability, suggesting right atrial pressure of 3 mmHg. FINDINGS  Left Ventricle: Left ventricular ejection fraction, by estimation, is 35 to 40%. The left ventricle  has moderately decreased function. The left ventricle demonstrates regional wall motion abnormalities. Strain imaging was not performed. The left ventricular internal cavity size was normal in size. There is moderate asymmetric left ventricular hypertrophy of the basal-septal segment. Left ventricular diastolic parameters are consistent with Grade I diastolic dysfunction (impaired relaxation).  LV Wall Scoring: The entire inferior wall, posterior wall, basal inferoseptal segment, and apex are akinetic. The entire anterior wall, antero-lateral wall, entire anterior septum, apical lateral segment, and mid inferoseptal segment are normal. Right Ventricle: The right ventricular size is not well visualized. No increase in right ventricular wall thickness. Right ventricular systolic function is normal. Tricuspid regurgitation signal is inadequate for assessing PA pressure. Left Atrium: Left atrial size was normal in size. Right Atrium: Right atrial size was normal in size. Pericardium: Trivial pericardial effusion is present. Presence of epicardial fat layer. Mitral Valve: The mitral valve is normal in structure. Trivial mitral valve regurgitation. Tricuspid Valve: The tricuspid valve is normal in structure. Tricuspid valve regurgitation is trivial. Aortic Valve: The aortic valve  was not well visualized. Aortic valve regurgitation is trivial. No aortic stenosis is present. Aortic valve peak gradient measures 3.8 mmHg. Pulmonic Valve: The pulmonic valve was not well visualized. Pulmonic valve regurgitation is not visualized. Aorta: Aortic dilatation noted. 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 Venous: The inferior vena cava is normal in size with greater than 50% respiratory variability, suggesting right atrial pressure of 3 mmHg. IAS/Shunts: The interatrial septum was not well visualized. Additional Comments: 3D imaging was not performed.   LEFT VENTRICLE PLAX 2D LVIDd:         4.80 cm      Diastology LVIDs:         4.00 cm      LV e' medial:    3.48 cm/s LV PW:         1.30 cm      LV E/e' medial:  11.2 LV IVS:        0.90 cm      LV e' lateral:   6.96 cm/s LVOT diam:     2.30 cm      LV E/e' lateral: 5.6 LV SV:         51 LV SV Index:   25 LVOT Area:     4.15 cm  LV Volumes (MOD) LV vol d, MOD A2C: 129.0 ml LV vol d, MOD A4C: 160.0 ml LV vol s, MOD A2C: 88.3 ml LV vol s, MOD A4C: 107.0 ml LV SV MOD A2C:     40.7 ml LV SV MOD A4C:     160.0 ml LV SV MOD BP:      46.0 ml RIGHT VENTRICLE             IVC RV S prime:     12.60 cm/s  IVC diam: 1.00 cm LEFT ATRIUM             Index        RIGHT ATRIUM           Index LA diam:        3.90 cm 1.89 cm/m   RA Area:     13.70 cm LA Vol (A2C):   32.4 ml 15.67 ml/m  RA Volume:   31.30 ml  15.16 ml/m LA Vol (A4C):   33.5 ml 16.23 ml/m LA Biplane Vol: 33.6 ml 16.28 ml/m  AORTIC VALVE AV Area (Vmax): 3.77 cm AV Vmax:        97.10 cm/s AV Peak Grad:   3.8 mmHg LVOT Vmax:      88.02 cm/s LVOT Vmean:     53.275 cm/s LVOT VTI:       0.123 m  AORTA Ao Root diam: 4.20 cm Ao Asc diam:  4.00 cm MITRAL VALVE MV Area (PHT): 3.65 cm    SHUNTS MV Decel Time: 208 msec    Systemic VTI:  0.12 m MV E velocity: 39.00 cm/s  Systemic Diam: 2.30 cm MV A velocity: 68.50 cm/s MV E/A ratio:  0.57 Epifanio Lesches MD Electronically signed by Epifanio Lesches MD Signature Date/Time: 02/05/2024/6:36:38 PM    Final (Updated)    DG Knee Left Port Result Date: 02/05/2024 CLINICAL DATA:  Pain EXAM: PORTABLE PELVIS 1-2 VIEWS; PORTABLE LEFT KNEE - 1-2 VIEW; PORTABLE RIGHT KNEE - 1-2 VIEW COMPARISON:  CT angiography chest abdomen pelvis 02/04/2024 FINDINGS: Pelvis: No fracture or dislocation. Extensive Atherosclerotic changes seen throughout visualized arterial segments. Moderate bilateral hip  osteoarthrosis. Right knee: No fracture or dislocation. 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 Moderate knee joint  effusion. 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 No fracture or dislocation. Calcific densities in the distal femur favored to be a benign chondroid lesion. Small knee joint effusion. IMPRESSION: 1. No acute fracture or dislocation of the pelvis or knees. 2. Moderate right and small left knee joint effusions. 3. Moderate bilateral hip osteoarthrosis. Electronically Signed   By: Acquanetta Belling M.D.   On: 02/05/2024 17:42   DG Knee Right Port Result Date: 02/05/2024 CLINICAL DATA:  Pain EXAM: PORTABLE PELVIS 1-2 VIEWS; PORTABLE LEFT KNEE - 1-2 VIEW; PORTABLE RIGHT KNEE - 1-2 VIEW COMPARISON:  CT angiography chest abdomen pelvis 02/04/2024 FINDINGS: Pelvis: No fracture or dislocation. Extensive Atherosclerotic changes seen throughout visualized arterial segments. Moderate bilateral hip osteoarthrosis. Right knee: No fracture or dislocation. 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 Moderate knee joint effusion. 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 No fracture or dislocation. Calcific densities in the distal femur favored to be a benign chondroid lesion. Small knee joint effusion. IMPRESSION: 1. No acute fracture or dislocation of the pelvis or knees. 2. Moderate right and small left knee joint effusions. 3. Moderate bilateral hip osteoarthrosis. Electronically Signed   By: Acquanetta Belling M.D.   On: 02/05/2024 17:42   DG Pelvis Portable Result Date: 02/05/2024 CLINICAL DATA:  Pain EXAM: PORTABLE PELVIS 1-2 VIEWS; PORTABLE LEFT KNEE - 1-2 VIEW; PORTABLE RIGHT KNEE - 1-2 VIEW COMPARISON:  CT angiography chest abdomen pelvis 02/04/2024 FINDINGS: Pelvis: No fracture or dislocation. Extensive Atherosclerotic changes seen throughout visualized arterial segments. Moderate bilateral hip osteoarthrosis. Right knee: No fracture or dislocation. 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 Moderate knee joint effusion. Left knee:  Chondrocalcinosis of the medial and lateral compartments. No fracture or dislocation. Calcific densities in the distal femur favored to be a benign chondroid lesion. Small knee joint effusion. IMPRESSION: 1. No acute fracture or dislocation of the pelvis or knees. 2. Moderate right and small left knee joint effusions. 3. Moderate bilateral hip osteoarthrosis. Electronically Signed   By: Acquanetta Belling M.D.   On: 02/05/2024 17:42    Cardiac Studies   Chest/ Abdominal/ Pelvic CTA 02/04/2024: Impression: 1. No aortic dissection or intramural hematoma. 2. 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 Recommend follow-up ultrasound every 3 years. 3. 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 4. 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 Moderate narrowing at the origin of the right main renal artery. 5. 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 Visualized segments of the left profunda femoris artery are occluded due to dense calcified plaque at the origin. 6. Severe stenosis of the proximal left SFA due to densely calcified plaque. 7. 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 8. Extensive descending and sigmoid colon diverticulosis without evidence of acute diverticulitis. 9. Moderate prostatomegaly. _______________   Echocardiogram 02/05/2024: Impressions: 1. Technically difficult study, consider limited echo with contrast to  better evaluate LV systolic function. Left ventricular ejection fraction,  by estimation, is 35 to 40%. The left ventricle has moderately decreased  function. The left ventricle  demonstrates regional wall motion abnormalities (see scoring  diagram/findings for description). There is moderate asymmetric  left  ventricular hypertrophy of the basal-septal segment. Left ventricular  diastolic parameters are consistent with Grade I  diastolic dysfunction (impaired relaxation).   2. Right ventricular systolic function is normal. The right ventricular  size is not well visualized. Tricuspid regurgitation signal is inadequate  for assessing PA pressure.   3. The mitral valve is normal in structure. Trivial mitral valve  regurgitation.   4. The aortic valve was not well visualized. Aortic valve regurgitation  is trivial. No aortic stenosis is present.   5. Aortic dilatation noted. There is dilatation of the aortic root,  measuring 42 mm. There is dilatation of the ascending aorta, measuring 40  mm.   6. The inferior vena cava is normal in size with greater than 50%  respiratory variability, suggesting right atrial pressure of 3 mmHg.  _______________   ABIs 02/05/2024: Summary: Right: Resting right ankle-brachial index indicates moderate right lower  extremity arterial disease. The right toe-brachial index is abnormal.   Left: Resting left ankle-brachial index indicates moderate left lower  extremity arterial disease. The left toe-brachial index is abnormal.   Patient Profile     83 y.o. 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   Assessment & Plan    Acute on chronic HFrEF - Presented to the ED for significant LE weakness.   - However, BNP was 896.  Negative edema on CXR or CTA. - ECHO 02/05/24: LVEF of 35-40% with akinesis of the inferior wall , posterior wall, basal inferoseptal segment as well as an akinetic apex as well as grade 1 diastolic dysfunction. (Compared to 2022: Wall motion abnormalities are not new but EF slightly lower than 45-50%) - Good response to IV lasix. Net - 4300.  D/C due to AKI response.  - Continue to monitor daily weight, strict I/O's and renal function.  - Currently remains volume overloaded with +1 pitting edema. Normal breath sounds.  - Home BP includes Nadolol but currently being held due to soft BP.  Will continue to d/c. Restarted BB with Toprol- XL given low EF.  Continue this med.  - Continue Jardiance 25 mg - Due to AKI, avoiding ARB/ARNI and MRNA at this time.  - Once GDMT is optimized for 6 months, will repeat echo outpatient   PAD  - CTA this admission showed long segment severe stenosis of the proximal superior mesenteric artery due to predominantly non-calcified plaque, near complete occlusion at the origin of the left main renal artery and moderate narrowing at the origin of the right main renal artery, near complete occlusion of the proximal left common femoral artery due to mixed calcified and non-calcified plaque, severe stenosis of the proximal left SFA.  - ABIs this admission are moderately reduced at 0.69 bilaterally (right stable from last imaging in 12/2022 but left has progressed some).  - Suspect leg pain/weakness 2/2 to knee pain.  - After receiving knee injection, reports significant improvement. He was able to walk around and take steps without difficulty. Denied any claudications.  - Low suspicion for critical limb ischemia.  No additional inpatient workup at this time.  Will follow-up with Dr. Samuella Cota in outpatient.   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 - No chest  pain. - Continue ASA 81 mg and Crestor 40mg   AKI vs Progressive CKD Stage III Last known creatinine 1.3 in 2022. This admission renal function:  - Creatinine 1.66>1.56>1.71>2.09 - BUN 37>32>36>51 - GFR 40>44>29>31 - Continue to monitor closely.   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  -  Continue medications for CHF as above.   Hyperlipidemia - Lipid panel this admission: Total Cholesterol 113, Triglycerides 93, HDL 51, LDLD 43. LDL goal <55 given CAD and PAD. - Continue Crestor 40mg  daily.   Otherwise, per primary team: - Generalized weakness - COPD - Type 2 diabetes mellitus - BPH - Rash on forehead      For questions or updates, please contact [facility] Please consult [email or web address] for contact info under        Signed, Basilio Cairo, PA-C  02/07/2024, 11:35 AM

## 2024-02-07 NOTE — Plan of Care (Signed)
   Problem: Education: Goal: Knowledge of General Education information will improve Description: Including pain rating scale, medication(s)/side effects and non-pharmacologic comfort measures Outcome: Progressing   Problem: Health Behavior/Discharge Planning: Goal: Ability to manage health-related needs will improve Outcome: Progressing   Problem: Clinical Measurements: Goal: Will remain free from infection Outcome: Progressing

## 2024-02-07 NOTE — Plan of Care (Signed)
   Problem: Health Behavior/Discharge Planning: Goal: Ability to manage health-related needs will improve Outcome: Progressing   Problem: Clinical Measurements: Goal: Ability to maintain clinical measurements within normal limits will improve Outcome: Progressing   Problem: Clinical Measurements: Goal: Will remain free from infection Outcome: Progressing

## 2024-02-07 NOTE — Progress Notes (Signed)
 Physical Therapy Treatment Patient Details Name: Steven Newman MRN: [id number] DOB: 03/17/1941 Today's Date: 02/07/2024   History of Present Illness Pt is a 83 y.o. male presenting 02/04/24 with a 2 day hx of bil LE weakness, LE swelling, DOE, and inability to walk. Admitted with systolic and diastolic CHF. PMHx includes CAD, previous MI, combined CHF, peripheral arterial disease, DM type II, essential hypertension, AAA, CKD 3 AA OSA not on CPAP, history of bladder cancer, prior history of agent orange exposure and HLD.    PT Comments  Pt greeted in recliner chair, wife present in room. He was pleasant and agreeable to PT session. Pt engaged in gait and stair training. He advanced his gait distance ambulating ~465ft using a RW with supervision-CGA and no seated rest breaks. Pt ascended/descended a flight of stairs using a step-to pattern, UE support, and CGA. He is making great progress towards his acute PT goals and nearing his baseline function. Updated recommendations to Home with HHPT. Will continue to follow acutely and advance appropriately.    If plan is discharge home, recommend the following: A little help with walking and/or transfers;Help with stairs or ramp for entrance;Assist for transportation;A little help with bathing/dressing/bathroom;Assistance with cooking/housework   Can travel by private vehicle        Equipment Recommendations  None recommended by PT (Pt already has DME)    Recommendations for Other Services       Precautions / Restrictions Precautions Precautions: Fall Recall of Precautions/Restrictions: Intact Restrictions Weight Bearing Restrictions Per Provider Order: No     Mobility  Bed Mobility               General bed mobility comments: Not observed. Pt greeted in recliner chair and returned there at end of session.    Transfers Overall transfer level: Needs assistance Equipment used: Rolling walker (2 wheels) Transfers: Sit to/from Stand Sit to  Stand: Contact guard assist           General transfer comment: STS from recliner chair. Good eccentric control.    Ambulation/Gait Ambulation/Gait assistance: Contact guard assist, Supervision Gait Distance (Feet): 400 Feet Assistive device: Rolling walker (2 wheels) Gait Pattern/deviations: Step-to pattern, Decreased stride length, Decreased weight shift to left, Decreased stance time - left   Gait velocity interpretation: 1.31 - 2.62 ft/sec, indicative of limited community ambulator   General Gait Details: Pt ambulated using RW with RLE maintained in ER, which pt reports is baseline for him. Pt tends to keep LLE extended secondary to pain with L knee flex. He is unable to dual task, stopping to answer questions during ambulation. Pt navigated obstacles well, but had RW too far in front of him during turn with half of his body outside of the device. VC to correct sequencing with improvement noted in following attempts.   Stairs Stairs: Yes Stairs assistance: Contact guard assist Stair Management: One rail Right, Forwards, Two rails, Step to pattern Number of Stairs: 10 General stair comments: Ascending: pt led with RLE, unilateral UE support on RHR, and maintaining LLE in ext. Descending: pt led with LLE, body slightly angled, BUE support on BHR.   Wheelchair Mobility     Tilt Bed    Modified Rankin (Stroke Patients Only)       Balance Overall balance assessment: Mild deficits observed, not formally tested  Communication Communication Communication: Impaired Factors Affecting Communication: Hearing impaired  Cognition Arousal: Alert Behavior During Therapy: WFL for tasks assessed/performed   PT - Cognitive impairments: No apparent impairments                       PT - Cognition Comments: Pt A,Ox4 Following commands: Intact      Cueing Cueing Techniques: Verbal cues  Exercises       General Comments General comments (skin integrity, edema, etc.): Discussed with pt/wife how to assist with stairs. Encouraged pt to continue with BLE HEP while seated in chair or supine in bed. Recommend OOB<>chair 3x/day and ambulation 2x/day while admitted. Update pt/family on recommendations.      Pertinent Vitals/Pain Pain Assessment Pain Assessment: Faces Faces Pain Scale: Hurts a little bit Pain Location: B knees Pain Descriptors / Indicators: Aching, Discomfort Pain Intervention(s): Monitored during session    Home Living                          Prior Function            PT Goals (current goals can now be found in the care plan section) Acute Rehab PT Goals Patient Stated Goal: Go Home Progress towards PT goals: Progressing toward goals    Frequency    Min 1X/week      PT Plan      Co-evaluation              AM-PAC PT "6 Clicks" Mobility   Outcome Measure  Help needed turning from your back to your side while in a flat bed without using bedrails?: A Little Help needed moving from lying on your back to sitting on the side of a flat bed without using bedrails?: A Little Help needed moving to and from a bed to a chair (including a wheelchair)?: A Little Help needed standing up from a chair using your arms (e.g., wheelchair or bedside chair)?: A Little Help needed to walk in hospital room?: A Little Help needed climbing 3-5 steps with a railing? : A Little 6 Click Score: 18    End of Session Equipment Utilized During Treatment: Gait belt Activity Tolerance: Patient tolerated treatment well Patient left: in chair;with call bell/phone within reach;with chair alarm set Nurse Communication: Mobility status PT Visit Diagnosis: Unsteadiness on feet (R26.81);Other abnormalities of gait and mobility (R26.89);Muscle weakness (generalized) (M62.81);History of falling (Z91.81);Difficulty in walking, not elsewhere classified (R26.2);Pain Pain -  Right/Left: Left Pain - part of body: Knee     Time: 1610-9604 PT Time Calculation (min) (ACUTE ONLY): 29 min  Charges:    $Gait Training: 23-37 mins PT General Charges $$ ACUTE PT VISIT: 1 Visit                     Cheri Guppy, PT, DPT Acute Rehabilitation Services Office: [phone] Secure Chat Preferred  Richardson Chiquito 02/07/2024, 12:51 PM

## 2024-02-08 ENCOUNTER — Ambulatory Visit (HOSPITAL_COMMUNITY): Admission: RE | Admit: 2024-02-08 | Payer: Medicare Other | Source: Ambulatory Visit

## 2024-02-08 DIAGNOSIS — I70203 Unspecified atherosclerosis of native arteries of extremities, bilateral legs: Secondary | ICD-10-CM | POA: Diagnosis present

## 2024-02-08 DIAGNOSIS — I10 Essential (primary) hypertension: Secondary | ICD-10-CM | POA: Diagnosis not present

## 2024-02-08 DIAGNOSIS — E119 Type 2 diabetes mellitus without complications: Secondary | ICD-10-CM | POA: Diagnosis not present

## 2024-02-08 DIAGNOSIS — E782 Mixed hyperlipidemia: Secondary | ICD-10-CM

## 2024-02-08 DIAGNOSIS — N400 Enlarged prostate without lower urinary tract symptoms: Secondary | ICD-10-CM | POA: Diagnosis present

## 2024-02-08 DIAGNOSIS — E559 Vitamin D deficiency, unspecified: Secondary | ICD-10-CM | POA: Diagnosis present

## 2024-02-08 DIAGNOSIS — M1712 Unilateral primary osteoarthritis, left knee: Secondary | ICD-10-CM | POA: Diagnosis not present

## 2024-02-08 DIAGNOSIS — I2510 Atherosclerotic heart disease of native coronary artery without angina pectoris: Secondary | ICD-10-CM | POA: Diagnosis present

## 2024-02-08 DIAGNOSIS — E7800 Pure hypercholesterolemia, unspecified: Secondary | ICD-10-CM | POA: Diagnosis present

## 2024-02-08 DIAGNOSIS — M160 Bilateral primary osteoarthritis of hip: Secondary | ICD-10-CM | POA: Diagnosis present

## 2024-02-08 DIAGNOSIS — N401 Enlarged prostate with lower urinary tract symptoms: Secondary | ICD-10-CM | POA: Diagnosis not present

## 2024-02-08 DIAGNOSIS — E1151 Type 2 diabetes mellitus with diabetic peripheral angiopathy without gangrene: Secondary | ICD-10-CM | POA: Diagnosis present

## 2024-02-08 DIAGNOSIS — J439 Emphysema, unspecified: Secondary | ICD-10-CM | POA: Diagnosis present

## 2024-02-08 DIAGNOSIS — I2583 Coronary atherosclerosis due to lipid rich plaque: Secondary | ICD-10-CM

## 2024-02-08 DIAGNOSIS — E785 Hyperlipidemia, unspecified: Secondary | ICD-10-CM | POA: Diagnosis not present

## 2024-02-08 DIAGNOSIS — I5023 Acute on chronic systolic (congestive) heart failure: Secondary | ICD-10-CM | POA: Diagnosis not present

## 2024-02-08 DIAGNOSIS — R531 Weakness: Secondary | ICD-10-CM | POA: Diagnosis present

## 2024-02-08 DIAGNOSIS — E1169 Type 2 diabetes mellitus with other specified complication: Secondary | ICD-10-CM | POA: Diagnosis present

## 2024-02-08 DIAGNOSIS — Z8709 Personal history of other diseases of the respiratory system: Secondary | ICD-10-CM | POA: Diagnosis not present

## 2024-02-08 DIAGNOSIS — N1832 Chronic kidney disease, stage 3b: Secondary | ICD-10-CM | POA: Diagnosis present

## 2024-02-08 DIAGNOSIS — K551 Chronic vascular disorders of intestine: Secondary | ICD-10-CM | POA: Diagnosis present

## 2024-02-08 DIAGNOSIS — I5043 Acute on chronic combined systolic (congestive) and diastolic (congestive) heart failure: Secondary | ICD-10-CM | POA: Diagnosis present

## 2024-02-08 DIAGNOSIS — N179 Acute kidney failure, unspecified: Secondary | ICD-10-CM | POA: Diagnosis not present

## 2024-02-08 DIAGNOSIS — E860 Dehydration: Secondary | ICD-10-CM | POA: Diagnosis present

## 2024-02-08 DIAGNOSIS — E1122 Type 2 diabetes mellitus with diabetic chronic kidney disease: Secondary | ICD-10-CM | POA: Diagnosis present

## 2024-02-08 DIAGNOSIS — I6523 Occlusion and stenosis of bilateral carotid arteries: Secondary | ICD-10-CM | POA: Diagnosis present

## 2024-02-08 DIAGNOSIS — I739 Peripheral vascular disease, unspecified: Secondary | ICD-10-CM | POA: Diagnosis not present

## 2024-02-08 DIAGNOSIS — N1831 Chronic kidney disease, stage 3a: Secondary | ICD-10-CM | POA: Diagnosis not present

## 2024-02-08 DIAGNOSIS — I130 Hypertensive heart and chronic kidney disease with heart failure and stage 1 through stage 4 chronic kidney disease, or unspecified chronic kidney disease: Secondary | ICD-10-CM | POA: Diagnosis present

## 2024-02-08 DIAGNOSIS — I723 Aneurysm of iliac artery: Secondary | ICD-10-CM | POA: Diagnosis present

## 2024-02-08 DIAGNOSIS — I509 Heart failure, unspecified: Secondary | ICD-10-CM | POA: Insufficient documentation

## 2024-02-08 DIAGNOSIS — E1165 Type 2 diabetes mellitus with hyperglycemia: Secondary | ICD-10-CM | POA: Diagnosis present

## 2024-02-08 DIAGNOSIS — M48061 Spinal stenosis, lumbar region without neurogenic claudication: Secondary | ICD-10-CM | POA: Diagnosis present

## 2024-02-08 DIAGNOSIS — Z8679 Personal history of other diseases of the circulatory system: Secondary | ICD-10-CM | POA: Diagnosis not present

## 2024-02-08 DIAGNOSIS — I7143 Infrarenal abdominal aortic aneurysm, without rupture: Secondary | ICD-10-CM | POA: Diagnosis present

## 2024-02-08 DIAGNOSIS — G8929 Other chronic pain: Secondary | ICD-10-CM | POA: Diagnosis present

## 2024-02-08 DIAGNOSIS — Z96612 Presence of left artificial shoulder joint: Secondary | ICD-10-CM | POA: Diagnosis present

## 2024-02-08 DIAGNOSIS — E872 Acidosis, unspecified: Secondary | ICD-10-CM | POA: Diagnosis not present

## 2024-02-08 HISTORY — DX: Atherosclerotic heart disease of native coronary artery without angina pectoris: I25.10

## 2024-02-08 HISTORY — DX: Heart failure, unspecified: I50.9

## 2024-02-08 LAB — GLUCOSE, CAPILLARY
Glucose-Capillary: 186 mg/dL — ABNORMAL HIGH (ref 70–99)
Glucose-Capillary: 271 mg/dL — ABNORMAL HIGH (ref 70–99)
Glucose-Capillary: 167 mg/dL — ABNORMAL HIGH (ref 70–99)
Glucose-Capillary: 182 mg/dL — ABNORMAL HIGH (ref 70–99)

## 2024-02-08 LAB — BASIC METABOLIC PANEL
Anion gap: 12 (ref 5–15)
BUN: 63 mg/dL — ABNORMAL HIGH (ref 8–23)
CO2: 23 mmol/L (ref 22–32)
Calcium: 8.8 mg/dL — ABNORMAL LOW (ref 8.9–10.3)
Chloride: 103 mmol/L (ref 98–111)
Creatinine, Ser: 2.12 mg/dL — ABNORMAL HIGH (ref 0.61–1.24)
GFR, Estimated: 30 mL/min — ABNORMAL LOW (ref >60)
Glucose, Bld: 203 mg/dL — ABNORMAL HIGH (ref 70–99)
Potassium: 4.2 mmol/L (ref 3.5–5.1)
Sodium: 138 mmol/L (ref 135–145)

## 2024-02-08 LAB — CBC
HCT: 42.2 % (ref 39.0–52.0)
Hemoglobin: 13.9 g/dL (ref 13.0–17.0)
MCH: 30.1 pg (ref 26.0–34.0)
MCHC: 32.9 g/dL (ref 30.0–36.0)
MCV: 91.3 fL (ref 80.0–100.0)
Platelets: 172 10*3/uL (ref 150–400)
RBC: 4.62 MIL/uL (ref 4.22–5.81)
RDW: 15.2 % (ref 11.5–15.5)
WBC: 10 10*3/uL (ref 4.0–10.5)
nRBC: 0 % (ref 0.0–0.2)

## 2024-02-08 NOTE — Plan of Care (Signed)

## 2024-02-08 NOTE — Hospital Course (Addendum)
 [name] was admitted to the hospital with the working diagnosis of acute on chronic heart failure exacerbation.   83 y.o. male with medical history significant for CAD, heart failure, peripheral arterial disease, non-insulin-dependent DM type II, essential hypertension, AAA, CKD 3 AA OSA, history of bladder cancer, prior history of agent orange exposure and hyperlipidemia presented to emergency department complaining of bilateral lower extremity weakness, rash, dyspnea on exertion and bilateral lower extremity swelling.   He also mentioned bilateral lower extremity pain left greater than right worse with ambulation.

## 2024-02-08 NOTE — Assessment & Plan Note (Signed)
 Continue blood pressure control and statin therapy  Patient was evaluated by vascular surgery, recommendation to continue medical therapy. Current symptoms not related to his peripheral vascular disease.  He will follow up with Dr Kirke Corin as outpatient.

## 2024-02-08 NOTE — Assessment & Plan Note (Signed)
 No sings of acute urinary retention

## 2024-02-08 NOTE — Assessment & Plan Note (Signed)
 AKI,   Volume status has improved, at the time of  discharge his serum cr is 1,97 with K at 4.1 and serum bicarbonate at 21  Na 136   Continue diuresis with oral furosemide, will start with as needed for signs of volume overload.  Follow up renal function as outpatient.

## 2024-02-08 NOTE — Progress Notes (Signed)
 Mobility Specialist Progress Note:   02/08/24 1216  Mobility  Activity Ambulated with assistance in hallway  Level of Assistance Contact guard assist, steadying assist  Assistive Device Front wheel walker  Distance Ambulated (ft) 400 ft  Activity Response Tolerated well  Mobility Referral Yes  Mobility visit 1 Mobility  Mobility Specialist Start Time (ACUTE ONLY) 1215  Mobility Specialist Stop Time (ACUTE ONLY) 1224  Mobility Specialist Time Calculation (min) (ACUTE ONLY) 9 min   Pt agreeable to mobility session. Required only minG assist throughout. VSS on RA. Pt back in bed with all needs met. Alarm on.  Addison Lank Mobility Specialist Please contact via SecureChat or  Rehab office at [phone]

## 2024-02-08 NOTE — Progress Notes (Addendum)
 Rounding Note    Patient Name: Steven Newman Date of Encounter: 02/08/2024  [facility] Cardiologist: Lorine Bears, MD  Subjective   This am denies any CP, SOB, dizziness, leg pain/weakness. Reported some concern about elevated glucose with steroid use and how to manage hyperglycemia management at home. He is looking forward to going home soon.   Inpatient Medications    Scheduled Meds:  aspirin EC  81 mg Oral Daily   cephALEXin  500 mg Oral TID   empagliflozin  25 mg Oral Daily   heparin  5,000 Units Subcutaneous Q8H   insulin aspart  0-9 Units Subcutaneous TID WC   metoprolol succinate  25 mg Oral Daily   mirabegron ER  25 mg Oral Daily   rosuvastatin  40 mg Oral Daily   sodium chloride flush  3 mL Intravenous Q12H   triamcinolone cream  1 Application Topical BID   umeclidinium-vilanterol  1 puff Inhalation Daily   Continuous Infusions:  PRN Meds: acetaminophen **OR** acetaminophen, ipratropium-albuterol, ondansetron **OR** ondansetron (ZOFRAN) IV, sodium chloride flush   Vital Signs    Vitals:   02/08/24 0031 02/08/24 0417 02/08/24 0725 02/08/24 0737  BP: 137/86 (!) 146/70 (!) 143/71   Pulse: 60 60 60   Resp: 19 18 18    Temp: 97.7 F (36.5 C) (!) 97.5 F (36.4 C) 97.8 F (36.6 C)   TempSrc: Oral Oral Oral   SpO2: 96% 94% 92% 94%  Weight: 89 kg     Height:        Intake/Output Summary (Last 24 hours) at 02/08/2024 0819 Last data filed at 02/08/2024 0700 Gross per 24 hour  Intake 360 ml  Output 1550 ml  Net -1190 ml      02/08/2024   12:31 AM 02/07/2024    4:08 AM 02/06/2024    5:00 AM  Last 3 Weights  Weight (lbs) 196 lb 3.4 oz 192 lb 7.4 oz 195 lb 1.7 oz  Weight (kg) 89 kg 87.3 kg 88.5 kg      Telemetry    AV block type 1, HR 70's - Personally Reviewed  ECG    None new - Personally Reviewed  Physical Exam   GEN: Sitting upright on side of bed in No acute distress.   Neck: No JVD Cardiac: RRR, no murmurs, rubs, or gallops.   Respiratory: Clear to auscultation bilaterally. GI: Soft, nontender, non-distended  MS: +1 pitting edema Neuro:  Nonfocal  Psych: Normal affect   Labs    High Sensitivity Troponin:   Recent Labs  Lab 02/04/24 1423 02/04/24 1730  TROPONINIHS 15 16     Chemistry Recent Labs  Lab 02/04/24 1423 02/05/24 0237 02/06/24 0304 02/07/24 0328 02/08/24 0246  NA 135 138 135 134* 138  K 4.8 3.9 4.0 4.4 4.2  CL 105 105 99 100 103  CO2 22 15* 20* 18* 23  GLUCOSE 154* 145* 145* 189* 203*  BUN 37* 32* 36* 51* 63*  CREATININE 1.66* 1.56* 1.71* 2.09* 2.12*  CALCIUM 8.9 9.0 8.8* 8.7* 8.8*  PROT 7.8 6.9  --   --   --   ALBUMIN 3.4* 2.8*  --   --   --   AST 35 21  --   --   --   ALT 46* 36  --   --   --   ALKPHOS 56 43  --   --   --   BILITOT 0.6 1.2  --   --   --  GFRNONAA 40* 44* 39* 31* 30*  ANIONGAP 8 18* 16* 16* 12    Lipids  Recent Labs  Lab 02/05/24 0237  CHOL 113  TRIG 93  HDL 51  LDLCALC 43  CHOLHDL 2.2    Hematology Recent Labs  Lab 02/06/24 0304 02/07/24 0328 02/08/24 0246  WBC 9.2 7.5 10.0  RBC 4.72 4.76 4.62  HGB 14.3 14.3 13.9  HCT 43.0 44.0 42.2  MCV 91.1 92.4 91.3  MCH 30.3 30.0 30.1  MCHC 33.3 32.5 32.9  RDW 15.5 15.3 15.2  PLT 130* 132* 172   Thyroid  Recent Labs  Lab 02/06/24 0304  TSH 3.764    BNP Recent Labs  Lab 02/04/24 1423  BNP 896.6*    DDimer No results for input(s): "DDIMER" in the last 168 hours.   Radiology    MR LUMBAR SPINE WO CONTRAST Result Date: 02/06/2024 CLINICAL DATA:  Weakness and ataxia. EXAM: MRI LUMBAR SPINE WITHOUT CONTRAST TECHNIQUE: Multiplanar, multisequence MR imaging of the lumbar spine was performed. No intravenous contrast was administered. COMPARISON:  CT chest, abdomen, and pelvis dated February 04, 2024. FINDINGS: Segmentation:  Standard. Alignment:  Physiologic. Vertebrae:  No fracture, evidence of discitis, or bone lesion. Conus medullaris and cauda equina: Conus extends to the L1 level. Conus and cauda  equina appear normal. Paraspinal and other soft tissues: Bilateral renal simple cysts. No follow-up imaging is recommended. Disc levels: T12-L1:  No significant disc bulge or herniation.  No stenosis. L1-L2: Mild disc bulging and bilateral facet arthropathy. No stenosis. L2-L3: Mild-to-moderate disc bulging. Mild right-greater-than-left lateral recess and neuroforaminal stenosis. No spinal canal stenosis. L3-L4: Mild-to-moderate disc bulging and mild bilateral facet arthropathy. Mild to moderate bilateral neuroforaminal stenosis. No spinal canal stenosis. L4-L5: Mild-to-moderate disc bulging with superimposed small central disc protrusion. Mild bilateral facet arthropathy. Small right-sided synovial cyst (series 16, image 35) without significant mass effect. Mild to moderate bilateral lateral recess and neuroforaminal stenosis. No spinal canal stenosis. L5-S1: Small broad-based posterior disc protrusion with right subarticular annular fissure. Mild bilateral facet arthropathy. Mild right lateral recess stenosis. No spinal canal or neuroforaminal stenosis. IMPRESSION: 1. Multilevel lumbar spondylosis as described above. Mild to moderate bilateral neuroforaminal stenosis at L3-L4 and L4-L5. Electronically Signed   By: Obie Dredge M.D.   On: 02/06/2024 15:03    Cardiac Studies   Chest/ Abdominal/ Pelvic CTA 02/04/2024: Impression: 1. No aortic dissection or intramural hematoma. 2. 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 Recommend follow-up ultrasound every 3 years. 3. 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 4. 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 Moderate narrowing at the origin of the right main renal artery. 5. 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  Visualized segments of the left profunda femoris artery are occluded due to dense calcified plaque at the origin. 6. Severe stenosis of the proximal left SFA due to densely calcified plaque. 7. 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 8. Extensive descending and sigmoid colon diverticulosis without evidence of acute diverticulitis. 9. Moderate prostatomegaly. _______________   Echocardiogram 02/05/2024: Impressions: 1. Technically difficult study, consider limited echo with contrast to  better evaluate LV systolic function. Left ventricular ejection fraction,  by estimation, is 35 to 40%. The left ventricle has moderately decreased  function. The left  ventricle  demonstrates regional wall motion abnormalities (see scoring  diagram/findings for description). There is moderate asymmetric left  ventricular hypertrophy of the basal-septal segment. Left ventricular  diastolic parameters are consistent with Grade I  diastolic dysfunction (impaired relaxation).   2. Right ventricular systolic function is normal. The right ventricular  size is not well visualized. Tricuspid regurgitation signal is inadequate  for assessing PA pressure.   3. The mitral valve is normal in structure. Trivial mitral valve  regurgitation.   4. The aortic valve was not well visualized. Aortic valve regurgitation  is trivial. No aortic stenosis is present.   5. Aortic dilatation noted. There is dilatation of the aortic root,  measuring 42 mm. There is dilatation of the ascending aorta, measuring 40  mm.   6. The inferior vena cava is normal in size with greater than 50%  respiratory variability, suggesting right atrial pressure of 3 mmHg.  _______________   ABIs 02/05/2024: Summary: Right: Resting right ankle-brachial index indicates moderate right lower  extremity arterial disease. The right toe-brachial index is abnormal.   Left: Resting left ankle-brachial index indicates  moderate left lower  extremity arterial disease. The left toe-brachial index is abnormal.   Patient Profile     83 y.o. 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   Assessment & Plan    Acute on chronic HFrEF - Presented to the ED for significant LE weakness.   - However, BNP was 896.  Negative edema on CXR or CTA. - ECHO 02/05/24: LVEF of 35-40% with akinesis of the inferior wall , posterior wall, basal inferoseptal segment as well as an akinetic apex as well as grade 1 diastolic dysfunction. (Compared to 2022: Wall motion abnormalities are not new but EF slightly lower than 45-50%) - Good response to IV lasix. D/C due to AKI response. Currently self- diuresing.  Net - 4990. Reports good urine output.  - Continue to monitor daily weight, strict I/O's and renal function.  - Currently remains volume overloaded with +1 pitting edema. Normal breath sounds.  - Home BP includes Nadolol but currently being held due to soft BP.  Will continue to d/c. Restarted BB with Toprol- XL given low EF.  Continue this med.  - Continue Jardiance 25 mg - Due to AKI, avoiding ARB/ARNI and MRNA at this time.  - Once GDMT is optimized for 6 months, will repeat echo outpatient    PAD  - CTA this admission showed long segment severe stenosis of the proximal superior mesenteric artery due to predominantly non-calcified plaque, near complete occlusion at the origin of the left main renal artery and moderate narrowing at the origin of the right main renal artery, near complete occlusion of the proximal left common femoral artery due to mixed calcified and non-calcified plaque, severe stenosis of the proximal left SFA.  - ABIs this admission are moderately reduced at 0.69 bilaterally (right stable from last imaging in  12/2022 but left has progressed some).  - Suspect leg pain/weakness 2/2 to knee pain.  - After receiving knee injection, reports significant improvement. He was able to walk around and take steps without difficulty. Denied any claudications.  - Low suspicion for critical limb ischemia.  No additional inpatient workup at this time.  Will follow-up  with Dr. Samuella Cota in outpatient.  - this am continue report improvement with leg pain/weakness.  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 - No chest pain. - Continue ASA 81 mg and Crestor 40mg    AKI vs Progressive CKD Stage III Last known creatinine 1.3 in 2022. This admission renal function:  - Creatinine 1.66>1.56>1.71>2.09 >2.12 - BUN 37>32>36>51 >63 - GFR 40>44>29>31 > 30 -  Still slightly trending upwards. Continue to monitor closely.    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  - Continue medications for CHF as above.   Hyperlipidemia - Lipid panel this admission: Total Cholesterol 113, Triglycerides 93, HDL 51, LDLD 43. LDL goal <55 given CAD and PAD. - Continue Crestor 40mg  daily.   Otherwise, per primary team: - Generalized weakness - COPD - Type 2 diabetes mellitus - BPH - Rash on forehead     For questions or updates, please contact [facility] Please consult [email or web address] for contact info under        Signed, Basilio Cairo, PA-C  02/08/2024, 8:19 AM     Patient seen and examined. Agree with assessment and plan. I/O since admission -5590.  Breather better.  No chest pain.  Telemetry reveals sinus rhythm at 70.  BUN/Cr increased today to 63/2.12.  Avoid ARB/ARNI presently.  No anginal symptoms.  Extensive PVD as previously noted followed by Dr. Romilda Joy.  Consider transitioning cardiology  care as well to Dr. Kirke Corin who follows his PVD.   Lennette Bihari, MD, [facility] 02/08/2024 12:35 PM

## 2024-02-08 NOTE — Progress Notes (Signed)
 Occupational Therapy Treatment Patient Details Name: Steven Newman MRN: [id number] DOB: 09/19/1941 Today's Date: 02/08/2024   History of present illness Pt is a 83 y.o. male presenting 02/04/24 with a 2 day hx of bil LE weakness, LE swelling, DOE, and inability to walk. Admitted with systolic and diastolic CHF. PMHx includes CAD, previous MI, combined CHF, peripheral arterial disease, DM type II, essential hypertension, AAA, CKD 3 AA OSA not on CPAP, history of bladder cancer, prior history of agent orange exposure and HLD.   OT comments  OT session focused on training in techniques for increased safety and independence with ADLs and with bed mobility and functional transfers/mobility during ADLs. Pt currently demonstrates ability to complete UB ADLs with Mod I to Contact guard assist, LB ADLs with Min to Mod assist, bed mobility with Contact guard assist, and functional transfers/mobility with a RW with Contact guard assist. VSS on RA throughout session. Pt participated well in session and is making good progress toward or has met OT goals. Pt's OT goals and discharge plan updated this day based on pt progress and current functional level. Pt will benefit from continued acute skilled OT services to address deficits outlined below and increase safety and independence with functional tasks. Post acute discharge, pt will benefit from continued skilled OT services in the home to maximize rehab potential.       If plan is discharge home, recommend the following:  A little help with walking and/or transfers;A little help with bathing/dressing/bathroom;Assistance with cooking/housework;Direct supervision/assist for medications management;Direct supervision/assist for financial management;Assist for transportation   Equipment Recommendations  BSC/3in1;Tub/shower seat    Recommendations for Other Services      Precautions / Restrictions Precautions Precautions: Fall Recall of Precautions/Restrictions:  Intact Restrictions Weight Bearing Restrictions Per Provider Order: No       Mobility Bed Mobility Overal bed mobility: Needs Assistance Bed Mobility: Supine to Sit     Supine to sit: Contact guard, HOB elevated     General bed mobility comments: Pt requires Min assist for trunk elevation in supine to sit.    Transfers Overall transfer level: Needs assistance Equipment used: Rolling walker (2 wheels) Transfers: Sit to/from Stand, Bed to chair/wheelchair/BSC Sit to Stand: Contact guard assist     Step pivot transfers: Contact guard assist           Balance Overall balance assessment: Mild deficits observed, not formally tested                                         ADL either performed or assessed with clinical judgement   ADL Overall ADL's : Needs assistance/impaired Eating/Feeding: Modified independent;Sitting   Grooming: Contact guard assist;Standing   Upper Body Bathing: Supervision/ safety;Set up;Sitting   Lower Body Bathing: Minimal assistance;Sit to/from stand;Sitting/lateral leans;Cueing for compensatory techniques   Upper Body Dressing : Contact guard assist;Sitting   Lower Body Dressing: Minimal assistance;Moderate assistance;Cueing for compensatory techniques;Sitting/lateral leans;Sit to/from stand Lower Body Dressing Details (indicate cue type and reason): Largely Min assist with Mod assist needed to donn socks Toilet Transfer: Contact guard assist;Ambulation;Regular Toilet;Rolling walker (2 wheels);Grab bars   Toileting- Clothing Manipulation and Hygiene: Minimal assistance;Cueing for compensatory techniques;Sit to/from stand;Sitting/lateral lean       Functional mobility during ADLs: Contact guard assist;Rolling walker (2 wheels) General ADL Comments: Pt with noted improvements in activity tolerance this session as compared to skilled OT  eval.    Extremity/Trunk Assessment Upper Extremity Assessment RUE Deficits / Details:  gross strength 4/5; ROM WFL; mild pain with PROM external/internal rotation of shoulder; coordination and sensation WFL RUE Sensation: WNL RUE Coordination: WNL LUE Deficits / Details: gross strength 4/5; AROM WFL; missing a portion of three digits of hands due to work injury >40 years ago, pt with functional use of L hand at baseline; noted improvem,ents in L UE proprioception and gross motor coordination this session as compared to skilled OT eval. LUE Sensation: WNL LUE Coordination:  (WFL)   Lower Extremity Assessment Lower Extremity Assessment: Defer to PT evaluation        Vision   Vision Assessment?: No apparent visual deficits Additional Comments: Pt with vision adequate for tasks completed this session. Not formally assessed this session.   Perception     Praxis     Communication Communication Communication: Impaired Factors Affecting Communication: Hearing impaired   Cognition Arousal: Alert Behavior During Therapy: WFL for tasks assessed/performed Cognition: Cognition impaired       Memory impairment (select all impairments): Short-term memory, Working memory Attention impairment (select first level of impairment): Alternating attention Executive functioning impairment (select all impairments): Organization, Problem solving OT - Cognition Comments: PT with noted improvements in cognition this session as compared to skilled OT eval. Pt AAOx4 and pleasant throughout session with continued cognitive deficits described above.                 Following commands: Intact        Cueing   Cueing Techniques: Verbal cues  Exercises      Shoulder Instructions       General Comments VSS on RA throughout ession. RN present during a portion of session.    Pertinent Vitals/ Pain       Pain Assessment Pain Assessment: Faces Faces Pain Scale: Hurts a little bit Pain Location: B knees Pain Descriptors / Indicators: Aching, Discomfort Pain Intervention(s):  Limited activity within patient's tolerance, Monitored during session, Repositioned  Home Living                                          Prior Functioning/Environment              Frequency  Min 2X/week        Progress Toward Goals  OT Goals(current goals can now be found in the care plan section)  Progress towards OT goals: Progressing toward goals;Goals met and updated - see care plan (Pt progessing toward or has met initial OT goals. Goals updated this day based on pt progress and current functional level.)  Acute Rehab OT Goals Patient Stated Goal: to return home with wife OT Goal Formulation: With patient/family Time For Goal Achievement: 02/19/24 Potential to Achieve Goals: Good ADL Goals Pt Will Perform Upper Body Bathing: with supervision;sitting Pt Will Perform Lower Body Bathing: with supervision;sitting/lateral leans;sit to/from stand Pt Will Perform Lower Body Dressing: with min assist;sitting/lateral leans;sit to/from stand Pt Will Transfer to Toilet: with modified independence;ambulating;regular height toilet (with least restrictive AD) Pt Will Perform Toileting - Clothing Manipulation and hygiene: with supervision;sitting/lateral leans;sit to/from stand Pt/caregiver will Perform Home Exercise Program: Both right and left upper extremity;With Supervision;With written HEP provided (Increased L UE gross motor coordination; increased activity tolerance) Additional ADL Goal #1: Patient will participate in vision screening during skilled OT session to aide in identifying  potential visual deficits that may affect safety and independence with functional tasks.  Plan      Co-evaluation                 AM-PAC OT "6 Clicks" Daily Activity     Outcome Measure   Help from another person eating meals?: None Help from another person taking care of personal grooming?: A Little Help from another person toileting, which includes using toliet,  bedpan, or urinal?: A Little Help from another person bathing (including washing, rinsing, drying)?: A Little Help from another person to put on and taking off regular upper body clothing?: A Little Help from another person to put on and taking off regular lower body clothing?: A Little 6 Click Score: 19    End of Session Equipment Utilized During Treatment: Gait belt;Rolling walker (2 wheels)  OT Visit Diagnosis: Repeated falls (R29.6);History of falling (Z91.81);Other (comment) (decreased activity tolerance)   Activity Tolerance Patient tolerated treatment well   Patient Left in chair;with call bell/phone within reach;with chair alarm set   Nurse Communication Mobility status;Other (comment) (Pt making good progress toward OT goals)        Time: 1914-7829 OT Time Calculation (min): 25 min  Charges: OT General Charges $OT Visit: 1 Visit OT Treatments $Self Care/Home Management : 23-37 mins  Danikah Budzik "Orson Eva., OTR/L, MA Acute Rehab [phone]   Lendon Colonel 02/08/2024, 4:21 PM

## 2024-02-08 NOTE — Assessment & Plan Note (Signed)
 Follow-up as outpatient

## 2024-02-08 NOTE — Assessment & Plan Note (Signed)
 Continue blood pressure control with metoprolol.

## 2024-02-08 NOTE — Assessment & Plan Note (Signed)
No signs of exacerbation  ?Continue with bronchodilator therapy.  ?

## 2024-02-08 NOTE — Assessment & Plan Note (Signed)
 Continue with statin therapy.  ?

## 2024-02-08 NOTE — Assessment & Plan Note (Signed)
 Uncontrolled with hyperglycemia. Hgb A1c 6.9  Patient was placed on insulin sliding scale for hyperglycemia during his hospitalization.  His fasting glucose at the time of discharge was 162 mg/dl.   Continue with statin therapy.

## 2024-02-08 NOTE — Assessment & Plan Note (Signed)
 Patient had old apical wall motion abnormalities.  Plan to continue medical therapy with aspirin.

## 2024-02-08 NOTE — Assessment & Plan Note (Signed)
 Sp local injection with improved pain and mobility.

## 2024-02-08 NOTE — Plan of Care (Signed)
   Problem: Health Behavior/Discharge Planning: Goal: Ability to manage health-related needs will improve Outcome: Progressing   Problem: Clinical Measurements: Goal: Ability to maintain clinical measurements within normal limits will improve Outcome: Progressing   Problem: Clinical Measurements: Goal: Will remain free from infection Outcome: Progressing

## 2024-02-08 NOTE — Assessment & Plan Note (Addendum)
 Echocardiogram with reduced LV systolic function with EF 35 to 40%, moderate asymmetric left ventricular hypertrophy of the basal septal segment. RV systolic function preserved.  LV akinetic entire inferior wall, posterior wall, basal inferior septal segment and apex.  Right and left atrium with normal size.   Urine output is 1,550 ml Systolic blood pressure 130 mmHg range.  Continue empagliflozin and metoprolol.   Volume status has improved, continue to hold on loop diuretic for now.  Limited pharmacologic therapy due to acutely reduced GFR.

## 2024-02-08 NOTE — Progress Notes (Signed)
 P Progress Note   Patient: Steven Newman:[id number] DOB: [birth year] DOA: 02/04/2024     0 DOS: the patient was seen and examined on 02/08/2024   Brief hospital course: Mr. Province was admitted to the hospital with the working diagnosis of acute on chronic heart failure exacerbation.   83 y.o. male with medical history significant for CAD, heart failure, peripheral arterial disease, non-insulin-dependent DM type II, essential hypertension, AAA, CKD 3 AA OSA, history of bladder cancer, prior history of agent orange exposure and hyperlipidemia presented to emergency department complaining of bilateral lower extremity weakness, rash, dyspnea on exertion and bilateral lower extremity swelling.   He also mentioned bilateral lower extremity pain left greater than right worse with ambulation.     Assessment and Plan: * Acute on chronic systolic CHF (congestive heart failure) (HCC) Echocardiogram with reduced LV systolic function with EF 35 to 40%, moderate asymmetric left ventricular hypertrophy of the basal septal segment. RV systolic function preserved.  LV akinetic entire inferior wall, posterior wall, basal inferior septal segment and apex.  Right and left atrium with normal size.   Urine output is 1,550 ml Systolic blood pressure 130 mmHg range.  Continue empagliflozin and metoprolol.   Volume status has improved, continue to hold on loop diuretic for now.  Limited pharmacologic therapy due to acutely reduced GFR.   Chronic kidney disease, stage 3a (HCC) AKI,   Renal function with serum cr at 2,12 with K at 4,2 and serum bicarbonate at 23  Na 138   Plan to continue to hold on loop diuretic today and follow up renal function in am.   History of COPD No signs of exacerbation. Continue with bronchodilator therapy   Essential hypertension Continue blood pressure monitoring.   History of abdominal aortic aneurysm (AAA) Follow up as outpatient  Hyperlipidemia Continue with statin  therapy   Non-insulin dependent type 2 diabetes mellitus (HCC) Continue glucose cover and monitoring with insulin sliding scale.  Not expected to continue insulin as outpatient.   Peripheral artery disease (HCC) Continue blood pressure control and statin therapy   BPH (benign prostatic hyperplasia) No sings of acute urinary retention   Arthritis of left knee Sp local injection with improved pain and mobility.   Coronary artery disease Patient had old apical wall motion abnormalities.  Plan to continue medical therapy with aspirin.         Subjective: Patient is out of bed to the chair, he has no dyspnea or chest pain, right knee pain has improved.   Physical Exam: Vitals:   02/08/24 0417 02/08/24 0725 02/08/24 0737 02/08/24 1100  BP: (!) 146/70 (!) 143/71  114/75  Pulse: 60 60  60  Resp: 18 18  18   Temp: (!) 97.5 F (36.4 C) 97.8 F (36.6 C)  97.8 F (36.6 C)  TempSrc: Oral Oral  Oral  SpO2: 94% 92% 94% 97%  Weight:      Height:       Neurology awake and alert ENT with mild pallor  Cardiovascular with S1 and S2 present and regular with no gallops, rubs or murmurs No JVD No lower extremity edema Respiratory with no rales or wheezing, prolonged expiratory phase Abdomen with no distention   Data Reviewed:    Family Communication: I spoke with patient's wife at the bedside, we talked in detail about patient's condition, plan of care and prognosis and all questions were addressed.   Disposition: Status is: Observation The patient will require care spanning > 2 midnights  and should be moved to inpatient because: monitor renal function   Planned Discharge Destination: Home      Author: Coralie Keens, MD 02/08/2024 11:56 AM  For on call review [email or web address].

## 2024-02-09 ENCOUNTER — Other Ambulatory Visit (HOSPITAL_COMMUNITY): Payer: Self-pay

## 2024-02-09 DIAGNOSIS — E785 Hyperlipidemia, unspecified: Secondary | ICD-10-CM

## 2024-02-09 DIAGNOSIS — I5023 Acute on chronic systolic (congestive) heart failure: Secondary | ICD-10-CM | POA: Diagnosis not present

## 2024-02-09 DIAGNOSIS — I739 Peripheral vascular disease, unspecified: Secondary | ICD-10-CM | POA: Diagnosis not present

## 2024-02-09 DIAGNOSIS — I2510 Atherosclerotic heart disease of native coronary artery without angina pectoris: Secondary | ICD-10-CM | POA: Diagnosis not present

## 2024-02-09 DIAGNOSIS — N1831 Chronic kidney disease, stage 3a: Secondary | ICD-10-CM | POA: Diagnosis not present

## 2024-02-09 DIAGNOSIS — E1169 Type 2 diabetes mellitus with other specified complication: Secondary | ICD-10-CM | POA: Diagnosis not present

## 2024-02-09 DIAGNOSIS — I10 Essential (primary) hypertension: Secondary | ICD-10-CM | POA: Diagnosis not present

## 2024-02-09 LAB — BASIC METABOLIC PANEL
Anion gap: 11 (ref 5–15)
BUN: 61 mg/dL — ABNORMAL HIGH (ref 8–23)
CO2: 21 mmol/L — ABNORMAL LOW (ref 22–32)
Calcium: 8.5 mg/dL — ABNORMAL LOW (ref 8.9–10.3)
Chloride: 104 mmol/L (ref 98–111)
Creatinine, Ser: 1.97 mg/dL — ABNORMAL HIGH (ref 0.61–1.24)
GFR, Estimated: 33 mL/min — ABNORMAL LOW (ref >60)
Glucose, Bld: 162 mg/dL — ABNORMAL HIGH (ref 70–99)
Potassium: 4.1 mmol/L (ref 3.5–5.1)
Sodium: 136 mmol/L (ref 135–145)

## 2024-02-09 LAB — CBC
HCT: 45.5 % (ref 39.0–52.0)
Hemoglobin: 14.4 g/dL (ref 13.0–17.0)
MCH: 29.6 pg (ref 26.0–34.0)
MCHC: 31.6 g/dL (ref 30.0–36.0)
MCV: 93.4 fL (ref 80.0–100.0)
Platelets: 192 10*3/uL (ref 150–400)
RBC: 4.87 MIL/uL (ref 4.22–5.81)
RDW: 15.3 % (ref 11.5–15.5)
WBC: 6.5 10*3/uL (ref 4.0–10.5)
nRBC: 0 % (ref 0.0–0.2)

## 2024-02-09 LAB — GLUCOSE, CAPILLARY
Glucose-Capillary: 157 mg/dL — ABNORMAL HIGH (ref 70–99)
Glucose-Capillary: 167 mg/dL — ABNORMAL HIGH (ref 70–99)

## 2024-02-09 MED ORDER — METOPROLOL SUCCINATE ER 25 MG PO TB24
25.0000 mg | ORAL_TABLET | Freq: Every day | ORAL | 0 refills | Status: DC
  Filled 2024-02-09: qty 30, 30d supply, fill #0

## 2024-02-09 MED ORDER — FUROSEMIDE 20 MG PO TABS
20.0000 mg | ORAL_TABLET | Freq: Every day | ORAL | 0 refills | Status: AC | PRN
  Filled 2024-02-09: qty 30, 30d supply, fill #0

## 2024-02-09 MED ORDER — FUROSEMIDE 20 MG PO TABS: 20.0000 mg | ORAL_TABLET | Freq: Every day | ORAL | Status: DC | PRN

## 2024-02-09 NOTE — Progress Notes (Addendum)
 Patient Name: Steven Newman Date of Encounter: 02/09/2024 [facility] Cardiologist: Lorine Bears, MD   Interval Summary  .    Feeling well this morning. Plans for DC today.   Vital Signs .    Vitals:   02/09/24 0027 02/09/24 0418 02/09/24 0810 02/09/24 0828  BP: 128/75 123/68 129/77   Pulse: 65 (!) 57 70   Resp: 16 15    Temp: 97.8 F (36.6 C) 98.4 F (36.9 C) 97.6 F (36.4 C)   TempSrc: Oral Oral Oral   SpO2: 93% 96% 93%   Weight: 88.2 kg   88.2 kg  Height:        Intake/Output Summary (Last 24 hours) at 02/09/2024 1025 Last data filed at 02/09/2024 0809 Gross per 24 hour  Intake 120 ml  Output 2740 ml  Net -2620 ml      02/09/2024    8:28 AM 02/09/2024   12:27 AM 02/08/2024   12:31 AM  Last 3 Weights  Weight (lbs) 194 lb 7.1 oz 194 lb 7.1 oz 196 lb 3.4 oz  Weight (kg) 88.2 kg 88.2 kg 89 kg      Telemetry/ECG    Sinus Rhythm - Personally Reviewed  Physical Exam .    GEN: No acute distress.   Neck: No JVD Cardiac: RRR, no murmurs, rubs, or gallops.  Respiratory: Clear to auscultation bilaterally. GI: Soft, nontender, non-distended  MS: No edema  Assessment & Plan .     Acute on chronic HFrEF -- Presented to the ED for significant LE weakness. BNP 896 -- Echo 02/05/2024 LVEF of 35-40% with akinesis of the inferior wall , posterior wall, basal inferoseptal segment as well as an akinetic apex as well as grade 1 diastolic dysfunction. -- s/p IV lasix, net - 7.4L, weight 207>>194lbs, auto-diuresed yesterday -- GDMT: Toprol 25mg  daily,  Jardiance 25 mg. No room for ARB/ARNI and MRNA at this time.  -- cardiomyopathy could be secondary to underlying CAD but seems reasonable to reassess outpatient to determine plans for evaluation pending renal function -- discussed need for daily weights, would plan for lasix 20mg  daily PRN    PAD  -- CTA this admission showed long segment severe stenosis of the proximal superior mesenteric artery due to predominantly  non-calcified plaque, near complete occlusion at the origin of the left main renal artery and moderate narrowing at the origin of the right main renal artery, near complete occlusion of the proximal left common femoral artery due to mixed calcified and non-calcified plaque, severe stenosis of the proximal left SFA.  -- ABIs moderately reduced at 0.69 bilaterally (right stable from last imaging in 12/2022 but left has progressed some).  -- reported improvement after receiving knee injection, therefore suspect symptoms were not 2/2 to PAD -- follow-up with Dr. Samuella Cota in outpatient.   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 LDL goal <55 given CAD and PAD. -- Crestor 40mg  daily    Otherwise, per primary  team: Generalized weakness COPD Type 2 diabetes mellitus BPH   Will arrange for outpatient follow up appt  For questions or updates, please contact [facility] Please consult [email or web address] for contact info under     Signed, [name], NP    Patient feels well. No cp. Cr slightly improved today.  Agree with assessment and plan. Pt has an appointment with Dr. Elease Hashimoto, previously arranged next week. With his imminent retirement consider subsequent transition to Dr. Kirke Corin who  follows his PVD.   Lennette Bihari, MD, [facility] 02/09/2024 11:56 AM

## 2024-02-09 NOTE — Plan of Care (Signed)

## 2024-02-09 NOTE — TOC Transition Note (Addendum)
 Transition of Care [facility]) - Discharge Note   Patient Details  Name: Steven Newman MRN: [id number] Date of Birth: September 26, 1941  [facility] CM/SW Contact:  Leone Haven, RN Phone Number: 02/09/2024, 11:56 AM   Clinical Narrative:    For dc today, NCM notified [place] with [employer].  Patient has transport home.  TOC Pharmacy to fill meds.     Barriers to Discharge: Continued Medical Work up, SNF Pending bed offer   Patient Goals and CMS Choice Patient states their goals for this hospitalization and ongoing recovery are:: Getn stronger to be able to go home          Discharge Placement                       Discharge Plan and Services Additional resources added to the After Visit Summary for   In-house Referral: Clinical Social Work                                   Social Drivers of Health (SDOH) Interventions SDOH Screenings   Food Insecurity: Unknown (02/05/2024)  Housing: Unknown (02/05/2024)  Transportation Needs: No Transportation Needs (02/05/2024)  Utilities: Not At Risk (02/05/2024)  Social Connections: Moderately Isolated (02/05/2024)  Tobacco Use: Medium Risk (02/05/2024)     Readmission Risk Interventions     No data to display

## 2024-02-09 NOTE — Discharge Summary (Addendum)
 Physician Discharge Summary   Patient: Steven Newman MRN: [id number] DOB: 1941/05/05  Admit date:     02/04/2024  Discharge date: 02/09/24  Discharge Physician: York Ram Juliette Standre   PCP: Lorenda Ishihara, MD   Recommendations at discharge:    Patient will continue diuresis with furosemide 20 mg as needed for volume overload, weight gain 2 to 3 lbs in 24 hrs or 5 lbs in 7 days.  Changed nadolol to metoprolol succinate.  Follow up renal function and electrolytes in 7 days as outpatient  Follow up with Dr Chales Salmon in 7 to 10 days as outpatient   I spoke with patient's wife over the phone, we talked in detail about patient's condition, plan of care and prognosis and all questions were addressed.   Discharge Diagnoses: Principal Problem:   Acute on chronic systolic CHF (congestive heart failure) (HCC) Active Problems:   Coronary artery disease   Chronic kidney disease, stage 3a (HCC)   Essential hypertension   Peripheral artery disease (HCC)   History of abdominal aortic aneurysm (AAA)   Type 2 diabetes mellitus with hyperlipidemia (HCC)   Arthritis of left knee   BPH (benign prostatic hyperplasia)   History of COPD  Resolved Problems:   * No resolved hospital problems. [facility] Course: [name] was admitted to the hospital with the working diagnosis of acute on chronic heart failure exacerbation.   83 y.o. male with medical history significant for CAD, heart failure, peripheral arterial disease, DM type II, essential hypertension, AAA, CKD 3 AA OSA, history of bladder cancer, prior history of agent orange exposure and hyperlipidemia presented to emergency department complaining of bilateral lower extremity weakness, rash, dyspnea on exertion and bilateral lower extremity swelling.   On her initial physical examination his blood pressure was 140/76, HR 71, RR 22 and 02 saturation 95%, lungs with no wheezing or rhonchi, heart with S1 and S2 present and regular,  abdomen with no distention and positive lower extremity edema.   Na 135, K 4,8 Cl 105 bicarbonate 22, glucose 154 bun 37 cr 1,6  BNP 896  High sensitive troponin 15 and 16  Wbc 8,4 hgb 14,9 plt 152   Sars covid 19 negative  Influenza negative   Urine analysis SG 1.015, protein 100, glucose > 500, negative hgb and negative leukocytes.   Chest radiograph with cardiomegaly with bilateral hilar vascular congestion with no effusions or infiltrates.   CT chest/ abdomen and pelvis with mild apical pre septal emphysema, mild bilateral ground glass opacities.  No aortic dissection or intramural hematoma.  Mild aneurysm of the infrarenal abdominal aorta measuring up to 3,1 cm, (recommend follow up US in 3 mo).  Long segment severe stenosis of the proximal superior mesenteric artery due to predominantly non calcified plaque.  Near complete occlusion at the origin of the left main renal artery.  Near complete occlusion of the proximal left common femoral artery.  Visualized segment of the left profunda femoris artery are occluded.  Severe stenosis of the proximal left SFA.  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   Patient was placed on furosemide with improvement in volume status.  Prolonged hospitalization due to AKI.  03/96 renal function has improved, plan to continue diuresis as outpatient.   Assessment and Plan: * Acute on chronic systolic CHF (congestive heart failure) (HCC)  Echocardiogram with reduced LV systolic function with EF 35 to 40%, moderate asymmetric left ventricular hypertrophy of the basal septal segment. RV systolic function preserved.  LV akinetic entire inferior wall, posterior wall, basal inferior septal segment and apex.  Right and left atrium with normal size.   Patient was placed on IV furosemide for diuresis,  negative fluid balance was achieved, - 8,210 ml, with significant improvement in his symptoms.   Continue empagliflozin and metoprolol.  Limited pharmacologic therapy due to acutely reduced GFR. Continue diuresis with as needed furosemide.   Coronary artery disease Patient had old apical wall motion abnormalities.  Plan to continue medical therapy with aspirin.   Chronic kidney disease, stage 3a (HCC) AKI,   Volume status has improved, at the time of  discharge his serum cr is 1,97 with K at 4.1 and serum bicarbonate at 21  Na 136   Continue diuresis with oral furosemide, will start with as needed for signs of volume overload.  Follow up renal function as outpatient.   Essential hypertension Continue blood pressure control with metoprolol   Peripheral artery disease (HCC) Continue blood pressure control and statin therapy  Patient was evaluated by vascular surgery, recommendation to continue medical therapy. Current symptoms not related to his peripheral vascular disease.  He will follow up with Dr Kirke Corin as outpatient.   History of abdominal aortic aneurysm (AAA) Follow up as outpatient  Type 2 diabetes mellitus with hyperlipidemia (HCC) Uncontrolled with hyperglycemia. Hgb A1c 6.9  Patient was placed on insulin sliding scale for hyperglycemia during his hospitalization.  His fasting glucose at the time of discharge was 162 mg/dl.   Continue with statin therapy.   Arthritis of left knee Sp local injection with improved pain and mobility.   BPH (benign prostatic hyperplasia) No sings of acute urinary retention   History of COPD No signs of exacerbation. Continue with bronchodilator therapy         Consultants: cardiology vascular surgery.  Procedures performed: none   Disposition: Home Diet recommendation:  Cardiac and Carb modified diet DISCHARGE MEDICATION: Allergies as of 02/09/2024       Reactions   Ace Inhibitors Other (See Comments)   Angioedema    Amoxicillin-pot Clavulanate Hives   Penicillins Swelling, Rash   Lips swell        Medication List     STOP taking these medications    Anoro Ellipta 62.5-25 MCG/ACT Aepb Generic drug: umeclidinium-vilanterol   cephALEXin 500 MG capsule Commonly known as: KEFLEX   nadolol 40 MG tablet Commonly known as: CORGARD       TAKE these medications    acetaminophen 325 MG tablet Commonly known as: TYLENOL Take 650 mg by mouth every 6 (six) hours as needed for mild pain (pain score 1-3) or moderate pain (pain score 4-6).   Airborne Pack Take 1 tablet by mouth 2 (two) times daily.   albuterol (2.5 MG/3ML) 0.083% nebulizer solution Commonly known as: PROVENTIL Take 2.5 mg by nebulization every 6 (six) hours as needed for wheezing or shortness of breath.   Aspirin Low Dose 81 MG tablet Generic drug: aspirin EC TAKE 1 TABLET DAILY   cetirizine 10 MG tablet Commonly known as: ZYRTEC Take 10 mg by mouth daily.   CO Q-10 PO Take 10 mg by mouth daily.   cyanocobalamin 1000 MCG tablet Commonly known as: VITAMIN B12 Take 1,000 mcg by mouth daily.   empagliflozin 25 MG Tabs tablet Commonly known as: JARDIANCE Take 25 mg by mouth  daily.   furosemide 20 MG tablet Commonly known as: LASIX Take 1 tablet (20 mg total) by mouth daily as needed for edema or fluid (take in case of weight gain 2 to 3 lbs in 24 hrs or 5 lbs in  7 days.).   metoprolol succinate 25 MG 24 hr tablet Commonly known as: TOPROL-XL Take 1 tablet (25 mg total) by mouth daily. Start taking on: February 10, 2024   mirabegron ER 50 MG Tb24 tablet Commonly known as: MYRBETRIQ Take 50 mg by mouth daily.   nitroGLYCERIN 0.4 MG SL tablet Commonly known as: NITROSTAT Place 1 tablet (0.4 mg total) under the tongue every 5 (five) minutes as needed for chest pain.   OSTEO BI-FLEX ONE PER DAY PO Take 2 tablets by mouth daily.   rosuvastatin 40 MG tablet Commonly known as: CRESTOR Take 1 tablet (40 mg total) by  mouth daily.   Tiotropium Bromide-Olodaterol 2.5-2.5 MCG/ACT Aers Take 2 puffs by mouth daily.   triamcinolone cream 0.1 % Commonly known as: KENALOG Apply 1 Application topically 2 (two) times daily as needed (Rash).   TURMERIC PO Take 1,350 mg by mouth daily.   Vitamin D-3 125 MCG (5000 UT) Tabs Take 5,000 Units by mouth daily.        Follow-up Information     [facility] Follow up.   Specialty: Home Health Services Why: Agency will call you to set up apt times Contact information: [address] [place] [address] [phone]                Discharge Exam: Filed Weights   02/08/24 0031 02/09/24 0027 02/09/24 0828  Weight: 89 kg 88.2 kg 88.2 kg   BP 129/77 (BP Location: Right Arm)   Pulse 70   Temp 97.6 F (36.4 C) (Oral)   Resp 15   Ht 5' 9.5" (1.765 m)   Wt 88.2 kg   SpO2 93%   BMI 28.30 kg/m   Patient is feeling well, he is out of the bed to the chair. Denies chest pain, no dyspnea, PND, orthopnea or lower extremity edema  Neurology awake and alert ENT with mild pallor Cardiovascular with S1 and S2 present and regular with no gallops, rubs or murmurs Respiratory with no rales or wheezing. No rhonchi Abdomen with no distention   Condition at discharge: stable  The results of significant diagnostics from this hospitalization (including imaging, microbiology, ancillary and laboratory) are listed below for reference.   Imaging Studies: MR LUMBAR SPINE WO CONTRAST Result Date: 02/06/2024 CLINICAL DATA:  Weakness and ataxia. EXAM: MRI LUMBAR SPINE WITHOUT CONTRAST TECHNIQUE: Multiplanar, multisequence MR imaging of the lumbar spine was performed. No intravenous contrast was administered. COMPARISON:  CT chest, abdomen, and pelvis dated February 04, 2024. FINDINGS: Segmentation:  Standard. Alignment:  Physiologic. Vertebrae:  No fracture, evidence of discitis, or bone lesion. Conus medullaris and cauda equina: Conus extends to the  L1 level. Conus and cauda equina appear normal. Paraspinal and other soft tissues: Bilateral renal simple cysts. No follow-up imaging is recommended. Disc levels: T12-L1:  No significant disc bulge or herniation.  No stenosis. L1-L2: Mild disc bulging and bilateral facet arthropathy. No stenosis. L2-L3: Mild-to-moderate disc bulging. Mild right-greater-than-left lateral recess and neuroforaminal stenosis. No spinal canal stenosis. L3-L4: Mild-to-moderate disc bulging and mild bilateral facet arthropathy. Mild to moderate bilateral neuroforaminal stenosis. No spinal canal stenosis. L4-L5: Mild-to-moderate disc bulging with superimposed small central disc protrusion. Mild bilateral facet arthropathy. Small right-sided  synovial cyst (series 16, image 35) without significant mass effect. Mild to moderate bilateral lateral recess and neuroforaminal stenosis. No spinal canal stenosis. L5-S1: Small broad-based posterior disc protrusion with right subarticular annular fissure. Mild bilateral facet arthropathy. Mild right lateral recess stenosis. No spinal canal or neuroforaminal stenosis. IMPRESSION: 1. Multilevel lumbar spondylosis as described above. Mild to moderate bilateral neuroforaminal stenosis at L3-L4 and L4-L5. Electronically Signed   By: Obie Dredge M.D.   On: 02/06/2024 15:03   MR THORACIC SPINE WO CONTRAST Result Date: 02/06/2024 CLINICAL DATA:  Initial evaluation for ataxia, thoracic pathology suspected. EXAM: MRI THORACIC SPINE WITHOUT CONTRAST TECHNIQUE: Multiplanar, multisequence MR imaging of the thoracic spine was performed. No intravenous contrast was administered. COMPARISON:  None Available. FINDINGS: Alignment: Mild dextroscoliosis. Alignment otherwise normal with preservation of the normal thoracic kyphosis. No listhesis. Vertebrae: Vertebral body height maintained without acute or chronic fracture. Bone marrow signal intensity within normal limits. No worrisome osseous lesions or abnormal  marrow edema. Cord:  Normal signal and morphology. Paraspinal and other soft tissues: Paraspinous soft tissues demonstrate no acute finding. Multiple T2 hyperintense renal cysts noted, benign in appearance, no follow-up imaging recommended. Disc levels: Ordinary for age multilevel disc desiccation noted throughout the thoracic spine. Mild degenerative endplate spurring noted at T8-9 through T12-L1. Mild multilevel facet hypertrophy. No significant spinal stenosis. Foramina remain patent. No overt neural impingement. IMPRESSION: 1. Normal MRI appearance of the thoracic spinal cord. No cord signal changes to suggest myelopathy. 2. Mild for age thoracic spondylosis without significant stenosis or overt neural impingement. Electronically Signed   By: Rise Mu M.D.   On: 02/06/2024 02:35   MR CERVICAL SPINE WO CONTRAST Result Date: 02/06/2024 CLINICAL DATA:  Initial evaluation for ataxia, cervical pathology suspected. EXAM: MRI CERVICAL SPINE WITHOUT CONTRAST TECHNIQUE: Multiplanar, multisequence MR imaging of the cervical spine was performed. No intravenous contrast was administered. COMPARISON:  None Available. FINDINGS: Alignment: Examination degraded by motion artifact. Straightening of the normal cervical lordosis.  No listhesis. Vertebrae: Vertebral body height maintained without acute or chronic fracture. Bone marrow signal intensity within normal limits. No worrisome osseous lesions. No abnormal marrow edema. Cord: Normal signal and morphology. Posterior Fossa, vertebral arteries, paraspinal tissues: Unremarkable. Disc levels: C2-C3: Mild left-sided uncovertebral spurring without significant disc bulge. Left-sided facet degeneration with bony ankylosis. No spinal stenosis. Mild left C3 foraminal narrowing. Right neural foramen remains patent. C3-C4: Degenerative vertebral disc space narrowing with diffuse disc osteophyte complex. Posterior component flattens and partially faces the ventral thecal  sac. Mild to moderate left greater than right facet hypertrophy. No significant spinal stenosis. Severe left with moderate right C4 foraminal narrowing. C4-C5: Mild right-sided uncovertebral spurring without significant disc bulge. Moderate right-sided facet arthrosis. No spinal stenosis. Severe right C5 foraminal narrowing. Left neural foramen remains patent. C5-C6: Minimal disc bulge with uncovertebral spurring. Moderate left with mild right facet hypertrophy. No spinal stenosis. Severe left with moderate right C6 foraminal narrowing. C6-C7: Mild disc bulge with uncovertebral spurring. Mild bilateral facet hypertrophy. No significant spinal stenosis. Mild to moderate bilateral C7 foraminal narrowing, slightly worse on the right. C7-T1: Negative interspace. Mild facet hypertrophy. No canal or foraminal stenosis. IMPRESSION: 1. Normal MRI appearance of the cervical spinal cord. No cord signal changes to suggest myelopathy or significant spinal stenosis. 2. Multilevel cervical spondylosis with associated moderate to severe bilateral C4 through C7 foraminal narrowing as above. 3. Moderate multilevel facet hypertrophy as above. Findings could contribute to underlying neck pain. Electronically Signed   By: Sharlet Salina  Phill Myron M.D.   On: 02/06/2024 02:29   VAS [place] ABI WITH/WO TBI Result Date: 02/05/2024  LOWER EXTREMITY DOPPLER STUDY Patient Name:  Steven Newman  Date of Exam:   02/05/2024 Medical Rec #: [id number]     Accession #:    [id number] Date of Birth: [birth year]     Patient Gender: M Patient Age:   83 years Exam Location:  [facility] Procedure:      VAS [place] ABI WITH/WO TBI Referring Phys: Tereasa Coop --------------------------------------------------------------------------------  Indications: Peripheral artery disease. (patient followed by Dr. Kennith Maes in the              past) CTA 02/04/2024, indicated severe stenosis of the proximal SMA,              near occlusion at the origin of the left main renal  artery, near              occlusion of proximal left femoral artery and the left profunda              artery, severe stenosis of the proximal left SFA, distal right CIA              aneurysm, moderate to severe stenosis at the origin of the right              internal iliac artery, narrowing of the right external iliac              artery, possible nonflow limiting focal dissection of the right              CFA. High Risk Factors: Hypertension, hyperlipidemia, Diabetes, prior MI, coronary                    artery disease. Other Factors: CKD IIIa, AAA, COPD, CHF.  Vascular Interventions: No vascular interventions. Comparison Study: Prior ABI done 02/01/2023 at [facility] Performing Technologist: Sherren Kerns RVS  Examination Guidelines: A complete evaluation includes at minimum, Doppler waveform signals and systolic blood pressure reading at the level of bilateral brachial, anterior tibial, and posterior tibial arteries, when vessel segments are accessible. Bilateral testing is considered an integral part of a complete examination. Photoelectric Plethysmograph (PPG) waveforms and toe systolic pressure readings are included as required and additional duplex testing as needed. Limited examinations for reoccurring indications may be performed as noted.  ABI Findings: +---------+------------------+-----+---------+--------+ Right    Rt Pressure (mmHg)IndexWaveform Comment  +---------+------------------+-----+---------+--------+ Brachial 124                    triphasic         +---------+------------------+-----+---------+--------+ PTA      86                0.69 biphasic          +---------+------------------+-----+---------+--------+ DP       86                0.69 biphasic          +---------+------------------+-----+---------+--------+ Great Toe65                0.52 Abnormal          +---------+------------------+-----+---------+--------+  +---------+------------------+-----+---------+-------+ Left     Lt Pressure (mmHg)IndexWaveform Comment +---------+------------------+-----+---------+-------+ Brachial 119                    triphasic        +---------+------------------+-----+---------+-------+ PTA      85  0.69 biphasic         +---------+------------------+-----+---------+-------+ DP       80                0.65 biphasic         +---------+------------------+-----+---------+-------+ Great Toe90                0.73 Abnormal         +---------+------------------+-----+---------+-------+ +-------+-----------+-----------+------------+------------+ ABI/TBIToday's ABIToday's TBIPrevious ABIPrevious TBI +-------+-----------+-----------+------------+------------+ Right  0.69       0.52       0.66        0.46         +-------+-----------+-----------+------------+------------+ Left   0.69       0.73       0.84        0.67         +-------+-----------+-----------+------------+------------+ Right ABIs and bilateral TBIs appear essentially unchanged compared to prior study on 02/01/2023. Left ABIs appear decreased compared to prior study on 02/01/23.  Summary: Right: Resting right ankle-brachial index indicates moderate right lower extremity arterial disease. The right toe-brachial index is abnormal. Left: Resting left ankle-brachial index indicates moderate left lower extremity arterial disease. The left toe-brachial index is abnormal. *See table(s) above for measurements and observations.  Electronically signed by Coral Else MD on 02/05/2024 at 9:27:15 PM.    Final    ECHOCARDIOGRAM COMPLETE Result Date: 02/05/2024    ECHOCARDIOGRAM REPORT   Patient Name:   Steven Newman Date of Exam: 02/05/2024 Medical Rec #:  [id number]    Height:       69.5 in Accession #:    [id number]   Weight:       197.3 lb Date of Birth:  1941-04-18    BSA:          2.064 m Patient Age:    82 years     BP:           116/72  mmHg Patient Gender: M            HR:           80 bpm. Exam Location:  Inpatient Procedure: 2D Echo, Cardiac Doppler and Color Doppler (Both Spectral and Color            Flow Doppler were utilized during procedure). Indications:     CHF-Acute Diastolic I50.31, CHF- Acute Systolic I50.21  History:         Patient has prior history of Echocardiogram examinations, most                  recent 09/11/2021. CHF, Previous Myocardial Infarction, PAD,                  COPD and CKD, stage 3; Risk Factors:Hypertension, Diabetes,                  Dyslipidemia and Sleep Apnea.  Sonographer:     Lucendia Herrlich RCS Referring Phys:  [id number] SUBRINA SUNDIL Diagnosing Phys: Epifanio Lesches MD IMPRESSIONS  1. Technically difficult study, consider limited echo with contrast to better evaluate LV systolic function. Left ventricular ejection fraction, by estimation, is 35 to 40%. The left ventricle has moderately decreased function. The left ventricle demonstrates regional wall motion abnormalities (see scoring diagram/findings for description). There is moderate asymmetric left ventricular hypertrophy of the basal-septal segment. Left ventricular diastolic parameters are consistent with Grade I diastolic dysfunction (impaired relaxation).  2. Right ventricular systolic function is normal. The right  ventricular size is not well visualized. Tricuspid regurgitation signal is inadequate for assessing PA pressure.  3. The mitral valve is normal in structure. Trivial mitral valve regurgitation.  4. The aortic valve was not well visualized. Aortic valve regurgitation is trivial. No aortic stenosis is present.  5. Aortic dilatation noted. 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  6. The inferior vena cava is normal in size with greater than 50% respiratory variability, suggesting right atrial pressure of 3 mmHg. FINDINGS  Left Ventricle: Left ventricular  ejection fraction, by estimation, is 35 to 40%. The left ventricle has moderately decreased function. The left ventricle demonstrates regional wall motion abnormalities. Strain imaging was not performed. The left ventricular internal cavity size was normal in size. There is moderate asymmetric left ventricular hypertrophy of the basal-septal segment. Left ventricular diastolic parameters are consistent with Grade I diastolic dysfunction (impaired relaxation).  LV Wall Scoring: The entire inferior wall, posterior wall, basal inferoseptal segment, and apex are akinetic. The entire anterior wall, antero-lateral wall, entire anterior septum, apical lateral segment, and mid inferoseptal segment are normal. Right Ventricle: The right ventricular size is not well visualized. No increase in right ventricular wall thickness. Right ventricular systolic function is normal. Tricuspid regurgitation signal is inadequate for assessing PA pressure. Left Atrium: Left atrial size was normal in size. Right Atrium: Right atrial size was normal in size. Pericardium: Trivial pericardial effusion is present. Presence of epicardial fat layer. Mitral Valve: The mitral valve is normal in structure. Trivial mitral valve regurgitation. Tricuspid Valve: The tricuspid valve is normal in structure. Tricuspid valve regurgitation is trivial. Aortic Valve: The aortic valve was not well visualized. Aortic valve regurgitation is trivial. No aortic stenosis is present. Aortic valve peak gradient measures 3.8 mmHg. Pulmonic Valve: The pulmonic valve was not well visualized. Pulmonic valve regurgitation is not visualized. Aorta: Aortic dilatation noted. 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 Venous: The inferior vena cava is normal in size with greater than 50% respiratory variability, suggesting right atrial pressure of 3 mmHg. IAS/Shunts: The interatrial septum was not well  visualized. Additional Comments: 3D imaging was not performed.  LEFT VENTRICLE PLAX 2D LVIDd:         4.80 cm      Diastology LVIDs:         4.00 cm      LV e' medial:    3.48 cm/s LV PW:         1.30 cm      LV E/e' medial:  11.2 LV IVS:        0.90 cm      LV e' lateral:   6.96 cm/s LVOT diam:     2.30 cm      LV E/e' lateral: 5.6 LV SV:         51 LV SV Index:   25 LVOT Area:     4.15 cm  LV Volumes (MOD) LV vol d, MOD A2C: 129.0 ml LV vol d, MOD A4C: 160.0 ml LV vol s, MOD A2C: 88.3 ml LV vol s, MOD A4C: 107.0 ml LV SV MOD A2C:     40.7 ml LV SV MOD A4C:     160.0 ml LV SV MOD BP:      46.0 ml RIGHT VENTRICLE  IVC RV S prime:     12.60 cm/s  IVC diam: 1.00 cm LEFT ATRIUM             Index        RIGHT ATRIUM           Index LA diam:        3.90 cm 1.89 cm/m   RA Area:     13.70 cm LA Vol (A2C):   32.4 ml 15.67 ml/m  RA Volume:   31.30 ml  15.16 ml/m LA Vol (A4C):   33.5 ml 16.23 ml/m LA Biplane Vol: 33.6 ml 16.28 ml/m  AORTIC VALVE AV Area (Vmax): 3.77 cm AV Vmax:        97.10 cm/s AV Peak Grad:   3.8 mmHg LVOT Vmax:      88.02 cm/s LVOT Vmean:     53.275 cm/s LVOT VTI:       0.123 m  AORTA Ao Root diam: 4.20 cm Ao Asc diam:  4.00 cm MITRAL VALVE MV Area (PHT): 3.65 cm    SHUNTS MV Decel Time: 208 msec    Systemic VTI:  0.12 m MV E velocity: 39.00 cm/s  Systemic Diam: 2.30 cm MV A velocity: 68.50 cm/s MV E/A ratio:  0.57 Epifanio Lesches MD Electronically signed by Epifanio Lesches MD Signature Date/Time: 02/05/2024/6:36:38 PM    Final (Updated)    DG Knee Left Port Result Date: 02/05/2024 CLINICAL DATA:  Pain EXAM: PORTABLE PELVIS 1-2 VIEWS; PORTABLE LEFT KNEE - 1-2 VIEW; PORTABLE RIGHT KNEE - 1-2 VIEW COMPARISON:  CT angiography chest abdomen pelvis 02/04/2024 FINDINGS: Pelvis: No fracture or dislocation. Extensive Atherosclerotic changes seen throughout visualized arterial segments. Moderate bilateral hip osteoarthrosis. Right knee: No fracture or dislocation. 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 Moderate knee joint effusion. 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 No fracture or dislocation. Calcific densities in the distal femur favored to be a benign chondroid lesion. Small knee joint effusion. IMPRESSION: 1. No acute fracture or dislocation of the pelvis or knees. 2. Moderate right and small left knee joint effusions. 3. Moderate bilateral hip osteoarthrosis. Electronically Signed   By: Acquanetta Belling M.D.   On: 02/05/2024 17:42   DG Knee Right Port Result Date: 02/05/2024 CLINICAL DATA:  Pain EXAM: PORTABLE PELVIS 1-2 VIEWS; PORTABLE LEFT KNEE - 1-2 VIEW; PORTABLE RIGHT KNEE - 1-2 VIEW COMPARISON:  CT angiography chest abdomen pelvis 02/04/2024 FINDINGS: Pelvis: No fracture or dislocation. Extensive Atherosclerotic changes seen throughout visualized arterial segments. Moderate bilateral hip osteoarthrosis. Right knee: No fracture or dislocation. 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 Moderate knee joint effusion. 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 No fracture or dislocation. Calcific densities in the distal femur favored to be a benign chondroid lesion. Small knee joint effusion. IMPRESSION: 1. No acute fracture or dislocation of the pelvis or knees. 2. Moderate right and small left knee joint effusions. 3. Moderate bilateral hip osteoarthrosis. Electronically Signed   By: Acquanetta Belling M.D.   On: 02/05/2024 17:42   DG Pelvis Portable Result Date: 02/05/2024 CLINICAL DATA:  Pain EXAM: PORTABLE PELVIS 1-2 VIEWS; PORTABLE LEFT KNEE - 1-2 VIEW; PORTABLE RIGHT KNEE - 1-2 VIEW COMPARISON:  CT angiography chest abdomen pelvis 02/04/2024 FINDINGS: Pelvis: No fracture or dislocation. Extensive Atherosclerotic changes seen throughout visualized arterial segments. Moderate bilateral hip osteoarthrosis. Right knee: No fracture or dislocation. Chondrocalcinosis of the medial and lateral  compartments. Moderate knee joint effusion. Left knee: Chondrocalcinosis  of the medial and lateral compartments. No fracture or dislocation. Calcific densities in the distal femur favored to be a benign chondroid lesion. Small knee joint effusion. IMPRESSION: 1. No acute fracture or dislocation of the pelvis or knees. 2. Moderate right and small left knee joint effusions. 3. Moderate bilateral hip osteoarthrosis. Electronically Signed   By: Acquanetta Belling M.D.   On: 02/05/2024 17:42   CT Angio Chest/Abd/Pel for Dissection W and/or Wo Contrast Result Date: 02/04/2024 CLINICAL DATA:  Acute aortic syndrome suspected. Weakness. Tachypnea. Chills. EXAM: CT ANGIOGRAPHY CHEST, ABDOMEN AND PELVIS TECHNIQUE: Non-contrast CT of the chest was initially obtained. Multidetector CT imaging through the chest, abdomen and pelvis was performed using the standard protocol during bolus administration of intravenous contrast. Multiplanar reconstructed images and MIPs were obtained and reviewed to evaluate the vascular anatomy. RADIATION DOSE REDUCTION: This exam was performed according to the departmental dose-optimization program which includes automated exposure control, adjustment of the mA and/or kV according to patient size and/or use of iterative reconstruction technique. CONTRAST:  80mL OMNIPAQUE IOHEXOL 350 MG/ML SOLN COMPARISON:  Chest two-view 02/04/2024 FINDINGS: CTA CHEST FINDINGS Cardiovascular: Heart size within normal limits. Severe triple vessel coronary artery calcifications. No pulmonary artery embolism. No aortic dissection or intramural hematoma. Mediastinum/Nodes: No enlarged mediastinal, hilar, or axillary lymph nodes. Thyroid gland, trachea, and esophagus demonstrate no significant findings. Lungs/Pleura: No focal airspace opacity to indicate pneumonia. Scattered tiny calcified pulmonary nodules consistent with prior granulomatous inflammation. Musculoskeletal: Numerous old healed bilateral rib fractures are  seen. No acute osseous abnormality. Review of the MIP images confirms the above findings. CTA ABDOMEN AND PELVIS FINDINGS VASCULAR Aorta: No aortic dissection. Calcified and noncalcified atheromatous plaque seen throughout the abdominal aorta with mild aneurysm of the infrarenal segment measuring up to 3.1 x 3.0 cm. Celiac: Patent without evidence of aneurysm, dissection, vasculitis or significant stenosis. 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 Moderate narrowing at the origin of the right main renal artery. IMA: Near complete occlusion at the origin of the IMA, which is otherwise patent. Right lower extremity arteries: Mild narrowing of the common iliac artery origin due to calcified atheromatous plaque. 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 Moderate to severe stenosis at the origin of the right internal iliac artery which is diffusely calcified but otherwise patent. Mild diffuse narrowing of the right external iliac artery due to calcified atheromatous plaque. Irregular appearance of the right common femoral artery with a possible nonflow limiting focal dissection. Stranding anterior to the right common femoral artery likely related to prior surgical intervention. No high-grade stenosis of the visualized proximal profunda femoris are superficial femoral arteries. Left lower extremity arteries: Diffuse calcified and noncalcified atheromatous plaque of the left common iliac artery without flow-limiting stenosis. Proximal left internal iliac artery is occluded. Opacification of distal branches consistent with retrograde collateral flow. Mild diffuse narrowing of the external iliac artery due to diffusely calcified plaque. Near complete occlusion of the proximal left common  femoral artery due to mixed calcified and noncalcified plaque. Moderate stenosis of the distal left common femoral artery due to densely calcified plaque. Visualized segments of the left profunda femoris artery are occluded due to dense calcified plaque at the origin. Severe stenosis of the proximal left SFA due to densely calcified plaque. Veins: No obvious venous  abnormality within the limitations of this arterial phase study. Review of the MIP images confirms the above findings. NON-VASCULAR Hepatobiliary: Mild pneumobilia likely related to prior sphincterotomy. Please correlate with patient history. Liver, gallbladder, and bile ducts are otherwise normal. Pancreas: Diffuse fatty atrophy of the pancreas which is otherwise unremarkable. Spleen: Normal in size without focal abnormality. Adrenals/Urinary Tract: Adrenal glands are normal. Subcentimeter renal hypodensities are too small to fully characterize, however most likely represent simple cysts which do not require dedicated imaging follow-up. Multiple additional simple cysts are also seen bilaterally with the largest located in the left kidney measuring 4.2 cm. These simple cysts do not require dedicated imaging surveillance. No hydronephrosis or hydroureter. Bladder is normal. Stomach/Bowel: No bowel dilatation to indicate ileus or obstruction. Extensive descending and sigmoid colon diverticulosis without evidence of acute diverticulitis. Appendix is normal. Lymphatic: No enlarged abdominopelvic lymph nodes. Reproductive: Moderate prostatomegaly. Other: No abdominal wall hernia or abnormality. No abdominopelvic ascites. Musculoskeletal: No acute or significant osseous findings. Review of the MIP images confirms the above findings. IMPRESSION: 1. No aortic dissection or intramural hematoma. 2. 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 Recommend follow-up ultrasound every 3 years. 3. 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 4. 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 Moderate narrowing at the origin of the right main renal artery. 5. 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 Visualized segments of the left profunda femoris artery are occluded due to dense calcified plaque at the origin. 6. Severe stenosis of the proximal left SFA due to densely calcified plaque. 7. 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 8. Extensive descending and sigmoid colon diverticulosis without evidence of acute diverticulitis. 9. Moderate prostatomegaly. Electronically Signed   By: Acquanetta Belling M.D.   On: 02/04/2024 18:07   DG Chest 2 View Result Date: 02/04/2024 CLINICAL DATA:  Weakness for 2 days.  Tachypnea and chills. EXAM: CHEST - 2 VIEW COMPARISON:  Radiographs 09/16/2021 and 07/29/2020.  CT 08/18/2015. FINDINGS: Lordotic positioning on the frontal examination. The heart size is stable at the upper limits of normal. There is mild aortic atherosclerosis. The lungs appear clear. No evidence of pleural effusion or pneumothorax. Previous left shoulder reverse arthroplasty without evidence of acute osseous abnormality. IMPRESSION: Stable examination.  No evidence of active cardiopulmonary process. Electronically Signed   By: Carey Bullocks M.D.   On: 02/04/2024 16:03    Microbiology: Results for orders placed or performed during the hospital encounter of 02/04/24  Resp panel by RT-PCR (RSV, Flu A&B, Covid) Anterior Nasal Swab     Status: None   Collection Time: 02/04/24  2:23 PM   Specimen: Anterior Nasal Swab  Result Value Ref Range Status   SARS Coronavirus 2 by RT PCR NEGATIVE NEGATIVE Final    Comment: (NOTE) SARS-CoV-2 target nucleic acids are NOT DETECTED.  The SARS-CoV-2 RNA is generally detectable  in upper respiratory specimens during the acute phase of infection. The lowest concentration of SARS-CoV-2 viral copies this assay can detect is 138 copies/mL. A negative result does not preclude SARS-Cov-2 infection and should not be used as the sole basis for treatment or other patient management decisions. A negative result may occur with  improper specimen collection/handling, submission of specimen other than nasopharyngeal swab, presence of viral  mutation(s) within the areas targeted by this assay, and inadequate number of viral copies(<138 copies/mL). A negative result must be combined with clinical observations, patient history, and epidemiological information. The expected result is Negative.  Fact Sheet for Patients:  BloggerCourse.com  Fact Sheet for Healthcare Providers:  SeriousBroker.it  This test is no t yet approved or cleared by the [place] FDA and  has been authorized for detection and/or diagnosis of SARS-CoV-2 by FDA under an Emergency Use Authorization (EUA). This EUA will remain  in effect (meaning this test can be used) for the duration of the COVID-19 declaration under Section 564(b)(1) of the Act, 21 U.S.C.section 360bbb-3(b)(1), unless the authorization is terminated  or revoked sooner.       Influenza A by PCR NEGATIVE NEGATIVE Final   Influenza B by PCR NEGATIVE NEGATIVE Final    Comment: (NOTE) The Xpert Xpress SARS-CoV-2/FLU/RSV plus assay is intended as an aid in the diagnosis of influenza from Nasopharyngeal swab specimens and should not be used as a sole basis for treatment. Nasal washings and aspirates are unacceptable for Xpert Xpress SARS-CoV-2/FLU/RSV testing.  Fact Sheet for Patients: BloggerCourse.com  Fact Sheet for Healthcare Providers: SeriousBroker.it  This test is not yet approved or cleared by the [place] FDA and has  been authorized for detection and/or diagnosis of SARS-CoV-2 by FDA under an Emergency Use Authorization (EUA). This EUA will remain in effect (meaning this test can be used) for the duration of the COVID-19 declaration under Section 564(b)(1) of the Act, 21 U.S.C. section 360bbb-3(b)(1), unless the authorization is terminated or revoked.     Resp Syncytial Virus by PCR NEGATIVE NEGATIVE Final    Comment: (NOTE) Fact Sheet for Patients: BloggerCourse.com  Fact Sheet for Healthcare Providers: SeriousBroker.it  This test is not yet approved or cleared by the [place] FDA and has been authorized for detection and/or diagnosis of SARS-CoV-2 by FDA under an Emergency Use Authorization (EUA). This EUA will remain in effect (meaning this test can be used) for the duration of the COVID-19 declaration under Section 564(b)(1) of the Act, 21 U.S.C. section 360bbb-3(b)(1), unless the authorization is terminated or revoked.  Performed at [facility], [address] [place], [place] [address]     Labs: CBC: Recent Labs  Lab 02/04/24 1423 02/05/24 0237 02/06/24 0304 02/07/24 0328 02/08/24 0246 02/09/24 0232  WBC 8.4 9.6 9.2 7.5 10.0 6.5  NEUTROABS 6.1  --   --   --   --   --   HGB 14.9 14.8 14.3 14.3 13.9 14.4  HCT 46.2 46.6 43.0 44.0 42.2 45.5  MCV 93.7 94.5 91.1 92.4 91.3 93.4  PLT 152 138* 130* 132* 172 192   Basic Metabolic Panel: Recent Labs  Lab 02/05/24 0237 02/06/24 0304 02/07/24 0328 02/08/24 0246 02/09/24 0232  NA 138 135 134* 138 136  K 3.9 4.0 4.4 4.2 4.1  CL 105 99 100 103 104  CO2 15* 20* 18* 23 21*  GLUCOSE 145* 145* 189* 203* 162*  BUN 32* 36* 51* 63* 61*  CREATININE 1.56* 1.71* 2.09* 2.12* 1.97*  CALCIUM 9.0 8.8* 8.7* 8.8* 8.5*   Liver Function Tests: Recent Labs  Lab 02/04/24 1423 02/05/24 0237  AST 35 21  ALT 46* 36  ALKPHOS 56 43  BILITOT 0.6 1.2  PROT 7.8 6.9  ALBUMIN 3.4*  2.8*   CBG: Recent Labs  Lab 02/08/24 1058 02/08/24 1507 02/08/24 2114 02/09/24 0606 02/09/24 1140  GLUCAP 271* 167* 182* 157*  167*    Discharge time spent: greater than 30 minutes.  Signed: Coralie Keens, MD [facility] 02/09/2024

## 2024-02-09 NOTE — Plan of Care (Signed)
°  Problem: Education: °Goal: Knowledge of General Education information will improve °Description: Including pain rating scale, medication(s)/side effects and non-pharmacologic comfort measures °Outcome: Progressing °  °Problem: Clinical Measurements: °Goal: Cardiovascular complication will be avoided °Outcome: Progressing °  °Problem: Activity: °Goal: Risk for activity intolerance will decrease °Outcome: Progressing °  °

## 2024-02-13 NOTE — Progress Notes (Unsigned)
  Cardiology Office Note:  .   Date:  02/14/2024  ID:  Steven Newman, DOB 1941-10-29, MRN [id number] PCP: Lorenda Ishihara, MD  [facility] Providers Cardiologist:  Lorine Bears, MD {, Eldridge Dace    History of Present Illness: .   Steven Newman is a 83 y.o. male with hx of CAD, carotid artery disease, HTN; obesity , HLD , AAA, right iliac aneurism  He sees Dr. Kirke Corin for his PAD   Seen with wife , Steven Newman.   He was in the [facility] last [facility] with dyspena Had been eating more salty foods than he should  Was seen by Dr Tresa Endo ,   Echo showed EF 35-40%.  Creatinine was acutely elevated Creatinine was 1.97 at DC   Currently on Jardiance 25 mg a day Lasix 20 mg a day  Toprol 25 mg a day     ROS:   Studies Reviewed: .         Risk Assessment/Calculations:             Physical Exam:   VS:  BP 122/70 (BP Location: Left Arm, Patient Position: Sitting, Cuff Size: Normal)   Pulse 69   Resp 16   Ht 5\' 9"  (1.753 m)   Wt 197 lb 6.4 oz (89.5 kg)   SpO2 96%   BMI 29.15 kg/m    Wt Readings from Last 3 Encounters:  02/14/24 197 lb 6.4 oz (89.5 kg)  02/09/24 194 lb 7.1 oz (88.2 kg)  12/19/23 207 lb (93.9 kg)    GEN: Well nourished, well developed in no acute distress NECK: No JVD; No carotid bruits CARDIAC: RRR, no murmurs, rubs, gallops RESPIRATORY:  Clear to auscultation without rales, wheezing or rhonchi  ABDOMEN: Soft, non-tender, non-distended EXTREMITIES:  No edema; No deformity   ASSESSMENT AND PLAN: .     Acute on chronic combined CHF:  continue lasix , jardiance. Consider adding spironolactone .  I think he will do well if he avoids salty foods.  Will have him see Dr. Kirke Corin for his general cards issues   2.  AAA / PAD:  sees Dr. Kirke Corin.     3. CKD :  creatinine is around 2.        Dispo: 6 months with Dr. Kirke Corin    Signed, Kristeen Miss, MD

## 2024-02-14 ENCOUNTER — Ambulatory Visit: Payer: Medicare Other | Attending: Cardiovascular Disease | Admitting: Cardiovascular Disease

## 2024-02-14 ENCOUNTER — Encounter: Payer: Self-pay | Admitting: Cardiovascular Disease

## 2024-02-14 VITALS — BP 122/70 | HR 69 | Resp 16 | Ht 69.0 in | Wt 197.4 lb

## 2024-02-14 DIAGNOSIS — I5043 Acute on chronic combined systolic (congestive) and diastolic (congestive) heart failure: Secondary | ICD-10-CM | POA: Diagnosis not present

## 2024-02-14 MED ORDER — METOPROLOL SUCCINATE ER 25 MG PO TB24: 25.0000 mg | ORAL_TABLET | Freq: Every day | ORAL | 3 refills | Status: DC

## 2024-02-14 NOTE — Patient Instructions (Signed)
 Follow-Up: At [facility], you and your health needs are our priority.  As part of our continuing mission to provide you with exceptional heart care, we have created designated Provider Care Teams.  These Care Teams include your primary Cardiologist (physician) and Advanced Practice Providers (APPs -  Physician Assistants and Nurse Practitioners) who all work together to provide you with the care you need, when you need it.  Your next appointment:   6 month(s)  Provider:   Lorine Bears, MD      1st Floor: - Lobby - Registration  - Pharmacy  - Lab - Cafe  2nd Floor: - PV Lab - Diagnostic Testing (echo, CT, nuclear med)  3rd Floor: - Vacant  4th Floor: - TCTS (cardiothoracic surgery) - AFib Clinic - Structural Heart Clinic - Vascular Surgery  - Vascular Ultrasound  5th Floor: - HeartCare Cardiology (general and EP) - Clinical Pharmacy for coumadin, hypertension, lipid, weight-loss medications, and med management appointments    Valet parking services will be available as well.

## 2024-02-15 DIAGNOSIS — I5022 Chronic systolic (congestive) heart failure: Secondary | ICD-10-CM | POA: Diagnosis not present

## 2024-02-15 DIAGNOSIS — I509 Heart failure, unspecified: Secondary | ICD-10-CM | POA: Diagnosis not present

## 2024-02-15 DIAGNOSIS — E1169 Type 2 diabetes mellitus with other specified complication: Secondary | ICD-10-CM | POA: Diagnosis not present

## 2024-02-15 DIAGNOSIS — J449 Chronic obstructive pulmonary disease, unspecified: Secondary | ICD-10-CM | POA: Diagnosis not present

## 2024-02-15 DIAGNOSIS — N1832 Chronic kidney disease, stage 3b: Secondary | ICD-10-CM | POA: Diagnosis not present

## 2024-02-16 DIAGNOSIS — M25461 Effusion, right knee: Secondary | ICD-10-CM | POA: Diagnosis not present

## 2024-02-16 DIAGNOSIS — D696 Thrombocytopenia, unspecified: Secondary | ICD-10-CM | POA: Diagnosis not present

## 2024-02-16 DIAGNOSIS — M170 Bilateral primary osteoarthritis of knee: Secondary | ICD-10-CM | POA: Diagnosis not present

## 2024-02-16 DIAGNOSIS — I252 Old myocardial infarction: Secondary | ICD-10-CM | POA: Diagnosis not present

## 2024-02-16 DIAGNOSIS — E559 Vitamin D deficiency, unspecified: Secondary | ICD-10-CM | POA: Diagnosis not present

## 2024-02-16 DIAGNOSIS — M25462 Effusion, left knee: Secondary | ICD-10-CM | POA: Diagnosis not present

## 2024-02-16 DIAGNOSIS — E1151 Type 2 diabetes mellitus with diabetic peripheral angiopathy without gangrene: Secondary | ICD-10-CM | POA: Diagnosis not present

## 2024-02-16 DIAGNOSIS — M47812 Spondylosis without myelopathy or radiculopathy, cervical region: Secondary | ICD-10-CM | POA: Diagnosis not present

## 2024-02-16 DIAGNOSIS — E1159 Type 2 diabetes mellitus with other circulatory complications: Secondary | ICD-10-CM | POA: Diagnosis not present

## 2024-02-16 DIAGNOSIS — E7800 Pure hypercholesterolemia, unspecified: Secondary | ICD-10-CM | POA: Diagnosis not present

## 2024-02-16 DIAGNOSIS — M7072 Other bursitis of hip, left hip: Secondary | ICD-10-CM | POA: Diagnosis not present

## 2024-02-16 DIAGNOSIS — G3184 Mild cognitive impairment, so stated: Secondary | ICD-10-CM | POA: Diagnosis not present

## 2024-02-16 DIAGNOSIS — I130 Hypertensive heart and chronic kidney disease with heart failure and stage 1 through stage 4 chronic kidney disease, or unspecified chronic kidney disease: Secondary | ICD-10-CM | POA: Diagnosis not present

## 2024-02-16 DIAGNOSIS — I5032 Chronic diastolic (congestive) heart failure: Secondary | ICD-10-CM | POA: Diagnosis not present

## 2024-02-16 DIAGNOSIS — M7071 Other bursitis of hip, right hip: Secondary | ICD-10-CM | POA: Diagnosis not present

## 2024-02-16 DIAGNOSIS — M47816 Spondylosis without myelopathy or radiculopathy, lumbar region: Secondary | ICD-10-CM | POA: Diagnosis not present

## 2024-02-16 DIAGNOSIS — J439 Emphysema, unspecified: Secondary | ICD-10-CM | POA: Diagnosis not present

## 2024-02-16 DIAGNOSIS — I701 Atherosclerosis of renal artery: Secondary | ICD-10-CM | POA: Diagnosis not present

## 2024-02-16 DIAGNOSIS — I712 Thoracic aortic aneurysm, without rupture, unspecified: Secondary | ICD-10-CM | POA: Diagnosis not present

## 2024-02-16 DIAGNOSIS — I5023 Acute on chronic systolic (congestive) heart failure: Secondary | ICD-10-CM | POA: Diagnosis not present

## 2024-02-16 DIAGNOSIS — N1832 Chronic kidney disease, stage 3b: Secondary | ICD-10-CM | POA: Diagnosis not present

## 2024-02-16 DIAGNOSIS — M9983 Other biomechanical lesions of lumbar region: Secondary | ICD-10-CM | POA: Diagnosis not present

## 2024-02-16 DIAGNOSIS — M160 Bilateral primary osteoarthritis of hip: Secondary | ICD-10-CM | POA: Diagnosis not present

## 2024-02-16 DIAGNOSIS — M47814 Spondylosis without myelopathy or radiculopathy, thoracic region: Secondary | ICD-10-CM | POA: Diagnosis not present

## 2024-02-16 DIAGNOSIS — E1122 Type 2 diabetes mellitus with diabetic chronic kidney disease: Secondary | ICD-10-CM | POA: Diagnosis not present

## 2024-02-20 DIAGNOSIS — E1122 Type 2 diabetes mellitus with diabetic chronic kidney disease: Secondary | ICD-10-CM | POA: Diagnosis not present

## 2024-02-20 DIAGNOSIS — I130 Hypertensive heart and chronic kidney disease with heart failure and stage 1 through stage 4 chronic kidney disease, or unspecified chronic kidney disease: Secondary | ICD-10-CM | POA: Diagnosis not present

## 2024-02-20 DIAGNOSIS — N1832 Chronic kidney disease, stage 3b: Secondary | ICD-10-CM | POA: Diagnosis not present

## 2024-02-20 DIAGNOSIS — I5023 Acute on chronic systolic (congestive) heart failure: Secondary | ICD-10-CM | POA: Diagnosis not present

## 2024-02-20 DIAGNOSIS — I5032 Chronic diastolic (congestive) heart failure: Secondary | ICD-10-CM | POA: Diagnosis not present

## 2024-02-20 DIAGNOSIS — E1151 Type 2 diabetes mellitus with diabetic peripheral angiopathy without gangrene: Secondary | ICD-10-CM | POA: Diagnosis not present

## 2024-02-22 DIAGNOSIS — E1151 Type 2 diabetes mellitus with diabetic peripheral angiopathy without gangrene: Secondary | ICD-10-CM | POA: Diagnosis not present

## 2024-02-22 DIAGNOSIS — I5023 Acute on chronic systolic (congestive) heart failure: Secondary | ICD-10-CM | POA: Diagnosis not present

## 2024-02-22 DIAGNOSIS — E1122 Type 2 diabetes mellitus with diabetic chronic kidney disease: Secondary | ICD-10-CM | POA: Diagnosis not present

## 2024-02-22 DIAGNOSIS — N1832 Chronic kidney disease, stage 3b: Secondary | ICD-10-CM | POA: Diagnosis not present

## 2024-02-22 DIAGNOSIS — I5032 Chronic diastolic (congestive) heart failure: Secondary | ICD-10-CM | POA: Diagnosis not present

## 2024-02-22 DIAGNOSIS — I130 Hypertensive heart and chronic kidney disease with heart failure and stage 1 through stage 4 chronic kidney disease, or unspecified chronic kidney disease: Secondary | ICD-10-CM | POA: Diagnosis not present

## 2024-02-27 DIAGNOSIS — N1832 Chronic kidney disease, stage 3b: Secondary | ICD-10-CM | POA: Diagnosis not present

## 2024-02-27 DIAGNOSIS — I130 Hypertensive heart and chronic kidney disease with heart failure and stage 1 through stage 4 chronic kidney disease, or unspecified chronic kidney disease: Secondary | ICD-10-CM | POA: Diagnosis not present

## 2024-02-27 DIAGNOSIS — I5032 Chronic diastolic (congestive) heart failure: Secondary | ICD-10-CM | POA: Diagnosis not present

## 2024-02-27 DIAGNOSIS — J449 Chronic obstructive pulmonary disease, unspecified: Secondary | ICD-10-CM | POA: Diagnosis not present

## 2024-02-27 DIAGNOSIS — I2510 Atherosclerotic heart disease of native coronary artery without angina pectoris: Secondary | ICD-10-CM | POA: Diagnosis not present

## 2024-02-27 DIAGNOSIS — I25118 Atherosclerotic heart disease of native coronary artery with other forms of angina pectoris: Secondary | ICD-10-CM | POA: Diagnosis not present

## 2024-02-27 DIAGNOSIS — I5023 Acute on chronic systolic (congestive) heart failure: Secondary | ICD-10-CM | POA: Diagnosis not present

## 2024-02-27 DIAGNOSIS — E1122 Type 2 diabetes mellitus with diabetic chronic kidney disease: Secondary | ICD-10-CM | POA: Diagnosis not present

## 2024-02-27 DIAGNOSIS — E1151 Type 2 diabetes mellitus with diabetic peripheral angiopathy without gangrene: Secondary | ICD-10-CM | POA: Diagnosis not present

## 2024-02-29 DIAGNOSIS — I130 Hypertensive heart and chronic kidney disease with heart failure and stage 1 through stage 4 chronic kidney disease, or unspecified chronic kidney disease: Secondary | ICD-10-CM | POA: Diagnosis not present

## 2024-02-29 DIAGNOSIS — I5023 Acute on chronic systolic (congestive) heart failure: Secondary | ICD-10-CM | POA: Diagnosis not present

## 2024-02-29 DIAGNOSIS — N1832 Chronic kidney disease, stage 3b: Secondary | ICD-10-CM | POA: Diagnosis not present

## 2024-02-29 DIAGNOSIS — E1122 Type 2 diabetes mellitus with diabetic chronic kidney disease: Secondary | ICD-10-CM | POA: Diagnosis not present

## 2024-02-29 DIAGNOSIS — E1151 Type 2 diabetes mellitus with diabetic peripheral angiopathy without gangrene: Secondary | ICD-10-CM | POA: Diagnosis not present

## 2024-02-29 DIAGNOSIS — I5032 Chronic diastolic (congestive) heart failure: Secondary | ICD-10-CM | POA: Diagnosis not present

## 2024-03-05 DIAGNOSIS — J449 Chronic obstructive pulmonary disease, unspecified: Secondary | ICD-10-CM | POA: Diagnosis not present

## 2024-03-05 DIAGNOSIS — I25118 Atherosclerotic heart disease of native coronary artery with other forms of angina pectoris: Secondary | ICD-10-CM | POA: Diagnosis not present

## 2024-03-05 DIAGNOSIS — I5023 Acute on chronic systolic (congestive) heart failure: Secondary | ICD-10-CM | POA: Diagnosis not present

## 2024-03-05 DIAGNOSIS — I2510 Atherosclerotic heart disease of native coronary artery without angina pectoris: Secondary | ICD-10-CM | POA: Diagnosis not present

## 2024-03-05 DIAGNOSIS — I5032 Chronic diastolic (congestive) heart failure: Secondary | ICD-10-CM | POA: Diagnosis not present

## 2024-03-05 DIAGNOSIS — E1151 Type 2 diabetes mellitus with diabetic peripheral angiopathy without gangrene: Secondary | ICD-10-CM | POA: Diagnosis not present

## 2024-03-05 DIAGNOSIS — N1832 Chronic kidney disease, stage 3b: Secondary | ICD-10-CM | POA: Diagnosis not present

## 2024-03-05 DIAGNOSIS — I130 Hypertensive heart and chronic kidney disease with heart failure and stage 1 through stage 4 chronic kidney disease, or unspecified chronic kidney disease: Secondary | ICD-10-CM | POA: Diagnosis not present

## 2024-03-05 DIAGNOSIS — E669 Obesity, unspecified: Secondary | ICD-10-CM | POA: Diagnosis not present

## 2024-03-05 DIAGNOSIS — E1122 Type 2 diabetes mellitus with diabetic chronic kidney disease: Secondary | ICD-10-CM | POA: Diagnosis not present

## 2024-03-07 DIAGNOSIS — E1151 Type 2 diabetes mellitus with diabetic peripheral angiopathy without gangrene: Secondary | ICD-10-CM | POA: Diagnosis not present

## 2024-03-07 DIAGNOSIS — I5023 Acute on chronic systolic (congestive) heart failure: Secondary | ICD-10-CM | POA: Diagnosis not present

## 2024-03-07 DIAGNOSIS — I5032 Chronic diastolic (congestive) heart failure: Secondary | ICD-10-CM | POA: Diagnosis not present

## 2024-03-07 DIAGNOSIS — N1832 Chronic kidney disease, stage 3b: Secondary | ICD-10-CM | POA: Diagnosis not present

## 2024-03-07 DIAGNOSIS — I130 Hypertensive heart and chronic kidney disease with heart failure and stage 1 through stage 4 chronic kidney disease, or unspecified chronic kidney disease: Secondary | ICD-10-CM | POA: Diagnosis not present

## 2024-03-07 DIAGNOSIS — E1122 Type 2 diabetes mellitus with diabetic chronic kidney disease: Secondary | ICD-10-CM | POA: Diagnosis not present

## 2024-03-13 DIAGNOSIS — I5023 Acute on chronic systolic (congestive) heart failure: Secondary | ICD-10-CM | POA: Diagnosis not present

## 2024-03-13 DIAGNOSIS — E1122 Type 2 diabetes mellitus with diabetic chronic kidney disease: Secondary | ICD-10-CM | POA: Diagnosis not present

## 2024-03-13 DIAGNOSIS — I130 Hypertensive heart and chronic kidney disease with heart failure and stage 1 through stage 4 chronic kidney disease, or unspecified chronic kidney disease: Secondary | ICD-10-CM | POA: Diagnosis not present

## 2024-03-13 DIAGNOSIS — N1832 Chronic kidney disease, stage 3b: Secondary | ICD-10-CM | POA: Diagnosis not present

## 2024-03-13 DIAGNOSIS — I5032 Chronic diastolic (congestive) heart failure: Secondary | ICD-10-CM | POA: Diagnosis not present

## 2024-03-13 DIAGNOSIS — E1151 Type 2 diabetes mellitus with diabetic peripheral angiopathy without gangrene: Secondary | ICD-10-CM | POA: Diagnosis not present

## 2024-03-15 DIAGNOSIS — I509 Heart failure, unspecified: Secondary | ICD-10-CM | POA: Diagnosis not present

## 2024-03-15 DIAGNOSIS — I2510 Atherosclerotic heart disease of native coronary artery without angina pectoris: Secondary | ICD-10-CM | POA: Diagnosis not present

## 2024-03-15 DIAGNOSIS — E669 Obesity, unspecified: Secondary | ICD-10-CM | POA: Diagnosis not present

## 2024-03-15 DIAGNOSIS — E1151 Type 2 diabetes mellitus with diabetic peripheral angiopathy without gangrene: Secondary | ICD-10-CM | POA: Diagnosis not present

## 2024-03-17 DIAGNOSIS — M47814 Spondylosis without myelopathy or radiculopathy, thoracic region: Secondary | ICD-10-CM | POA: Diagnosis not present

## 2024-03-17 DIAGNOSIS — I5032 Chronic diastolic (congestive) heart failure: Secondary | ICD-10-CM | POA: Diagnosis not present

## 2024-03-17 DIAGNOSIS — E7800 Pure hypercholesterolemia, unspecified: Secondary | ICD-10-CM | POA: Diagnosis not present

## 2024-03-17 DIAGNOSIS — M9983 Other biomechanical lesions of lumbar region: Secondary | ICD-10-CM | POA: Diagnosis not present

## 2024-03-17 DIAGNOSIS — M25462 Effusion, left knee: Secondary | ICD-10-CM | POA: Diagnosis not present

## 2024-03-17 DIAGNOSIS — M25461 Effusion, right knee: Secondary | ICD-10-CM | POA: Diagnosis not present

## 2024-03-17 DIAGNOSIS — M7071 Other bursitis of hip, right hip: Secondary | ICD-10-CM | POA: Diagnosis not present

## 2024-03-17 DIAGNOSIS — M160 Bilateral primary osteoarthritis of hip: Secondary | ICD-10-CM | POA: Diagnosis not present

## 2024-03-17 DIAGNOSIS — E559 Vitamin D deficiency, unspecified: Secondary | ICD-10-CM | POA: Diagnosis not present

## 2024-03-17 DIAGNOSIS — G3184 Mild cognitive impairment, so stated: Secondary | ICD-10-CM | POA: Diagnosis not present

## 2024-03-17 DIAGNOSIS — E1122 Type 2 diabetes mellitus with diabetic chronic kidney disease: Secondary | ICD-10-CM | POA: Diagnosis not present

## 2024-03-17 DIAGNOSIS — M47812 Spondylosis without myelopathy or radiculopathy, cervical region: Secondary | ICD-10-CM | POA: Diagnosis not present

## 2024-03-17 DIAGNOSIS — I5023 Acute on chronic systolic (congestive) heart failure: Secondary | ICD-10-CM | POA: Diagnosis not present

## 2024-03-17 DIAGNOSIS — M170 Bilateral primary osteoarthritis of knee: Secondary | ICD-10-CM | POA: Diagnosis not present

## 2024-03-17 DIAGNOSIS — I701 Atherosclerosis of renal artery: Secondary | ICD-10-CM | POA: Diagnosis not present

## 2024-03-17 DIAGNOSIS — I252 Old myocardial infarction: Secondary | ICD-10-CM | POA: Diagnosis not present

## 2024-03-17 DIAGNOSIS — M7072 Other bursitis of hip, left hip: Secondary | ICD-10-CM | POA: Diagnosis not present

## 2024-03-17 DIAGNOSIS — I712 Thoracic aortic aneurysm, without rupture, unspecified: Secondary | ICD-10-CM | POA: Diagnosis not present

## 2024-03-17 DIAGNOSIS — E1151 Type 2 diabetes mellitus with diabetic peripheral angiopathy without gangrene: Secondary | ICD-10-CM | POA: Diagnosis not present

## 2024-03-17 DIAGNOSIS — N1832 Chronic kidney disease, stage 3b: Secondary | ICD-10-CM | POA: Diagnosis not present

## 2024-03-17 DIAGNOSIS — I130 Hypertensive heart and chronic kidney disease with heart failure and stage 1 through stage 4 chronic kidney disease, or unspecified chronic kidney disease: Secondary | ICD-10-CM | POA: Diagnosis not present

## 2024-03-17 DIAGNOSIS — D696 Thrombocytopenia, unspecified: Secondary | ICD-10-CM | POA: Diagnosis not present

## 2024-03-17 DIAGNOSIS — E1159 Type 2 diabetes mellitus with other circulatory complications: Secondary | ICD-10-CM | POA: Diagnosis not present

## 2024-03-17 DIAGNOSIS — M47816 Spondylosis without myelopathy or radiculopathy, lumbar region: Secondary | ICD-10-CM | POA: Diagnosis not present

## 2024-03-17 DIAGNOSIS — J439 Emphysema, unspecified: Secondary | ICD-10-CM | POA: Diagnosis not present

## 2024-03-21 ENCOUNTER — Encounter (INDEPENDENT_AMBULATORY_CARE_PROVIDER_SITE_OTHER): Payer: Self-pay | Admitting: Otolaryngology

## 2024-03-21 ENCOUNTER — Ambulatory Visit (INDEPENDENT_AMBULATORY_CARE_PROVIDER_SITE_OTHER): Payer: Medicare Other | Admitting: Otolaryngology

## 2024-03-21 ENCOUNTER — Ambulatory Visit: Payer: Medicare Other | Admitting: Podiatry

## 2024-03-21 VITALS — BP 154/84 | HR 82 | Ht 69.0 in | Wt 194.0 lb

## 2024-03-21 DIAGNOSIS — R0982 Postnasal drip: Secondary | ICD-10-CM | POA: Diagnosis not present

## 2024-03-21 DIAGNOSIS — R490 Dysphonia: Secondary | ICD-10-CM | POA: Diagnosis not present

## 2024-03-21 DIAGNOSIS — R0981 Nasal congestion: Secondary | ICD-10-CM

## 2024-03-21 DIAGNOSIS — J383 Other diseases of vocal cords: Secondary | ICD-10-CM | POA: Diagnosis not present

## 2024-03-21 DIAGNOSIS — J300 Vasomotor rhinitis: Secondary | ICD-10-CM

## 2024-03-21 DIAGNOSIS — J310 Chronic rhinitis: Secondary | ICD-10-CM | POA: Diagnosis not present

## 2024-03-21 DIAGNOSIS — J3089 Other allergic rhinitis: Secondary | ICD-10-CM

## 2024-03-21 DIAGNOSIS — R221 Localized swelling, mass and lump, neck: Secondary | ICD-10-CM

## 2024-03-21 MED ORDER — FLUTICASONE PROPIONATE 50 MCG/ACT NA SUSP: 2.0000 | Freq: Every day | NASAL | 6 refills | Status: DC

## 2024-03-21 MED ORDER — IPRATROPIUM BROMIDE 0.03 % NA SOLN: 2.0000 | Freq: Two times a day (BID) | NASAL | 12 refills | Status: AC

## 2024-03-21 NOTE — Progress Notes (Signed)
 ENT CONSULT:  Reason for Consult: left neck mass x 8-9 mo   HPI: Discussed the use of AI scribe software for clinical note transcription with the patient, who gave verbal consent to proceed.  History of Present Illness Steven Newman is an 83 year old male who presents with a recurrent left neck mass. He was referred by Dr. Suszanne Conners for evaluation of the neck mass.  He had a left neck mass first noticed in 1999, initially diagnosed as non-cancerous and surgically removed at [facility]. Over the past 8 to 9 months, the mass has regrown, located deep in the neck as a 'regular lump' without pain or skin changes. No imaging has been done for the current mass. He is not sure what the exact diagnosis was when the mass was removed at [facility] in 1999.   He has a history of smoking, quitting approximately five years ago. He has chronic heart failure/CKD, with a recent hospitalization four weeks ago, and is scheduled for follow-up labs and an appointment with his nephrologist, Dr. Valentino Nose, due to his kidney disease.   He has a long-standing raspy voice, not associated with the neck mass, and a history of skin cancer on the face and scalp, though the type is unknown. He also has a past diagnosis of bladder cancer, which he considers irrelevant to the current issue.  He experiences constant nasal dripping and takes cetirizine (Zyrtec) at night for it. He has a history of allergic reactions to penicillin and possibly steroids, which caused facial swelling (inhaled steroids). He has COPD.  Records Reviewed:  02/05/24 admission H&P from hospital admission Steven Newman is a 83 y.o. male with medical history significant of CAD, previous MI, combined CHF with reduced EF 40 to 45%, peripheral arterial disease, non-insulin-dependent DM type II, essential hypertension, AAA, CKD 3 AA OSA not on CPAP, history of bladder cancer, prior history of agent orange exposure and hyperlipidemia presented to emergency  department complaining of bilateral lower extremity weakness, rash, dyspnea on exertion and bilateral lower extremity swelling. During my evaluation at the bedside patient reported that he has dyspnea on exertion and paroxysmal nocturnal dyspnea as well.  Currently have to use 2-3 pillows to lift himself up during sleep.  Denies any chest pain, palpitation, headache, blurry vision, diaphoresis, nausea, change of appetite and weight.  Complaining about bilateral lower extremities pain more severe on the left side as compared to right, bilateral lower extremities pain aggravated by walking and improves with rest.  Patient denies any postprandial abdominal and periumbilical pain. Denies any noticing black tarry stool or bright red per rectum.   He is also stating that he has history of skin cancer of his forehead and face 2-3 times and currently has been using topical regimen and his wife going to bring to show [place] tomorrow.  Unable to find any record on the chart. Unable to verify with patient's which medication he has been taking or not.  Patient is alert oriented and answering all the questions appropriately.        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   Myocardial infarction    Neck pain 03/05/2015   Obesity    OSA (obstructive sleep apnea) 08/09/2013    October 2014:AHI of 14 , RDI 33/h and desaturation to 88%    Osteoarthritis of left knee 04/24/2021   Osteoarthritis of right knee 04/24/2021   Pain in joint of left shoulder 01/14/2021   Peripheral arterial disease    Pneumonia    Rotator cuff tear arthropathy 05/14/2021   Skin cancer    Thrombocytopenia    Type 2 diabetes mellitus with peripheral angiopathy    Urinary incontinence    Venous insufficiency (chronic) (peripheral)    Vitamin D deficiency    Wheezing     Past Surgical History:  Procedure Laterality Date   bladder cancer surgery     CARDIAC CATHETERIZATION     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 His smoking use included cigarettes. He started smoking about 58 years ago. He has a 100 pack-year smoking history. He has never used smokeless tobacco. He reports current alcohol use. He reports that he does not use drugs.  Allergies:  Allergies  Allergen Reactions   Ace Inhibitors Other (See Comments)    Angioedema   Amoxicillin-Pot Clavulanate Hives   Penicillins Swelling and Rash    Lips swell    Medications: I have reviewed the patient's current medications.  The PMH, PSH, Medications, Allergies, and SH were reviewed and updated.  ROS: Constitutional:  Negative for fever, weight loss and weight gain. Cardiovascular: Negative for chest pain and dyspnea on exertion. Respiratory: Is not experiencing shortness of breath at rest. Gastrointestinal: Negative for nausea and vomiting. Neurological: Negative for headaches. Psychiatric: The patient is not nervous/anxious  Blood pressure (!) 154/84, pulse 82, height 5\' 9"  (1.753 m), weight 194 lb (88 kg), SpO2 97%. Body mass index is 28.65 kg/m.  PHYSICAL EXAM:  Exam: General: Well-developed, well-nourished Communication and Voice: raspy Respiratory Respiratory effort: Equal inspiration and expiration without stridor Cardiovascular Peripheral Vascular: Warm extremities with equal color/perfusion Eyes: No nystagmus with equal extraocular motion bilaterally Neuro/Psych/Balance: Patient oriented to person, place, and time; Appropriate mood and affect; Gait is intact with no imbalance; Cranial nerves I-XII are intact Head and Face Inspection: Normocephalic and atraumatic without mass or lesion Palpation: Facial skeleton intact without bony stepoffs Salivary Glands: No mass or tenderness Facial Strength: Facial motility symmetric and full  bilaterally ENT Pinna: External ear intact and fully developed External canal: Canal is patent with intact skin Tympanic Membrane: Clear and mobile External Nose: No scar or anatomic deformity Internal Nose: Septum is relatively straight. No polyp, or purulence. Mucosal edema and erythema present.  Bilateral inferior turbinate hypertrophy.  Lips, Teeth, and gums: Mucosa and teeth intact and viable TMJ: No pain to palpation with full mobility Oral cavity/oropharynx: No erythema or exudate, no lesions present Nasopharynx: No mass or lesion with intact mucosa Hypopharynx: Intact mucosa without pooling of secretions Larynx Glottic: Full true vocal cord mobility without lesion or mass VF atrophy and glottic insufficiency Supraglottic: Normal appearing epiglottis  and AE folds Interarytenoid Space: Moderate pachydermia&edema Subglottic Space: Patent without lesion or edema Neck Neck and Trachea: Midline trachea without mass or lesion Thyroid: No mass or nodularity Lymphatics: palpable node vs mass left border of SCM, mobile and soft   Procedure: Preoperative diagnosis: dysphonia left neck mass  Postoperative diagnosis:   Same + VF atrophy glottic insufficiency  Procedure: Flexible fiberoptic laryngoscopy  Surgeon: Artice Last, MD  Anesthesia: Topical lidocaine and Afrin Complications: None Condition is stable throughout exam  Indications and consent:  The patient presents to the clinic with above symptoms. Indirect laryngoscopy view was incomplete. Thus it was recommended that they undergo a flexible fiberoptic laryngoscopy. All of the risks, benefits, and potential complications were reviewed with the patient preoperatively and verbal informed consent was obtained.  Procedure: The patient was seated upright in the clinic. Topical lidocaine and Afrin were applied to the nasal cavity. After adequate anesthesia had occurred, I then proceeded to pass the flexible telescope into the nasal cavity. The nasal cavity was patent without rhinorrhea or polyp. The nasopharynx was also patent without mass or lesion. The base of tongue was visualized and was normal. There were no signs of pooling of secretions in the piriform sinuses. The true vocal folds were mobile bilaterally. There were no signs of glottic or supraglottic mucosal lesion or mass. There was moderate interarytenoid pachydermia and post cricoid edema. The telescope was then slowly withdrawn and the patient tolerated the procedure throughout.      Studies Reviewed: MRI cervical spine (done for ataxia) 1. Normal MRI appearance of the cervical spinal cord. No cord signal changes to suggest myelopathy or significant spinal stenosis. 2. Multilevel cervical spondylosis with associated  moderate to severe bilateral C4 through C7 foraminal narrowing as above. 3. Moderate multilevel facet hypertrophy as above. Findings could contribute to underlying neck pain.  Assessment/Plan: No diagnosis found.  Assessment and Plan Assessment & Plan Neck mass left side  Hx of left sided neck excision at [facility], reports benign pathology but exact diagnosis is unknown. No records available for review.  Recurrent neck mass in the same location x 8-9 mo, no associated pain, no skin changes, denies dysphagia odynophagia, hemoptysis, has had raspy voice for a long time, before he noticed neck mass. Neck mass is palpable on exam ~ 2 cm in size, soft, mobile, without induration or skin changes. This is likely lymph node vs lipoma cannot rule out neoplasm vs infectious process, etiology unclear. CT scan needed for further evaluation. Smoking is a risk factor for malignancy. - Order CT scan of the neck with contrast, pending nephrologist clearance to have IV contrast 2/2 hx of CKD - patient will check with nephrology to ensure he is ok to have contrast with this scan  Chronic rhinitis/post-nasal drainage Long-standing nasal drippage, likely allergic or non-allergic rhinitis (such as  vasomotor rhinitis).  - Prescribe Atrovent nasal spray.  - will hold off on Flonase 2/2 hx of allergic reaction to steroid inhaler in the past  Hoarseness Raspy voice for several years. Flexible scope exam with VF atrophy and glottic insufficiency and changes c/w GERD LPR, but no masses or lesions. 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  - Offer referral to voice specialist for therapy if desired - he would like to hold off at this time  Follow-up Follow-up needed to review CT scan results and evaluate neck mass. - Schedule follow-up appointment post-CT scan.  Thank you for allowing me to participate in the care of this patient. Please do not hesitate to contact me with  any questions or concerns.   Artice Last, MD Otolaryngology [facility] Phone: [phone] Fax: [phone]    03/21/2024, 1:52 PM

## 2024-03-26 DIAGNOSIS — N1832 Chronic kidney disease, stage 3b: Secondary | ICD-10-CM | POA: Diagnosis not present

## 2024-03-27 DIAGNOSIS — I2510 Atherosclerotic heart disease of native coronary artery without angina pectoris: Secondary | ICD-10-CM | POA: Diagnosis not present

## 2024-03-27 DIAGNOSIS — N1832 Chronic kidney disease, stage 3b: Secondary | ICD-10-CM | POA: Diagnosis not present

## 2024-03-27 DIAGNOSIS — I25118 Atherosclerotic heart disease of native coronary artery with other forms of angina pectoris: Secondary | ICD-10-CM | POA: Diagnosis not present

## 2024-03-27 DIAGNOSIS — J449 Chronic obstructive pulmonary disease, unspecified: Secondary | ICD-10-CM | POA: Diagnosis not present

## 2024-03-29 DIAGNOSIS — I5023 Acute on chronic systolic (congestive) heart failure: Secondary | ICD-10-CM | POA: Diagnosis not present

## 2024-03-29 DIAGNOSIS — I130 Hypertensive heart and chronic kidney disease with heart failure and stage 1 through stage 4 chronic kidney disease, or unspecified chronic kidney disease: Secondary | ICD-10-CM | POA: Diagnosis not present

## 2024-03-29 DIAGNOSIS — N1832 Chronic kidney disease, stage 3b: Secondary | ICD-10-CM | POA: Diagnosis not present

## 2024-03-29 DIAGNOSIS — E1122 Type 2 diabetes mellitus with diabetic chronic kidney disease: Secondary | ICD-10-CM | POA: Diagnosis not present

## 2024-03-29 DIAGNOSIS — I5032 Chronic diastolic (congestive) heart failure: Secondary | ICD-10-CM | POA: Diagnosis not present

## 2024-03-29 DIAGNOSIS — E1151 Type 2 diabetes mellitus with diabetic peripheral angiopathy without gangrene: Secondary | ICD-10-CM | POA: Diagnosis not present

## 2024-04-03 ENCOUNTER — Encounter: Payer: Self-pay | Admitting: Podiatry

## 2024-04-03 ENCOUNTER — Ambulatory Visit (INDEPENDENT_AMBULATORY_CARE_PROVIDER_SITE_OTHER): Admitting: Podiatry

## 2024-04-03 DIAGNOSIS — M79675 Pain in left toe(s): Secondary | ICD-10-CM

## 2024-04-03 DIAGNOSIS — N1832 Chronic kidney disease, stage 3b: Secondary | ICD-10-CM

## 2024-04-03 DIAGNOSIS — M79674 Pain in right toe(s): Secondary | ICD-10-CM | POA: Diagnosis not present

## 2024-04-03 DIAGNOSIS — E1151 Type 2 diabetes mellitus with diabetic peripheral angiopathy without gangrene: Secondary | ICD-10-CM

## 2024-04-03 DIAGNOSIS — B351 Tinea unguium: Secondary | ICD-10-CM | POA: Diagnosis not present

## 2024-04-03 DIAGNOSIS — E1169 Type 2 diabetes mellitus with other specified complication: Secondary | ICD-10-CM

## 2024-04-03 NOTE — Progress Notes (Signed)
 This patient returns to my office for at risk foot care.  This patient requires this care by a professional since this patient will be at risk due to having type 2 diabetes, CKD and swelling right foot.  This patient is unable to cut nails himself since the patient cannot reach his nails.These nails are painful walking and wearing shoes.  This patient presents for at risk foot care today.  General Appearance  Alert, conversant and in no acute stress.  Vascular  Dorsalis pedis and posterior tibial  pulses are  weakly palpable  bilaterally.  Capillary return is within normal limits  bilaterally. Temperature is within normal limits  bilaterally.  Neurologic  Senn-Weinstein monofilament wire test within normal limits  bilaterally. Muscle power within normal limits bilaterally.  Nails Thick disfigured discolored nails with subungual debris  from hallux to fifth toes bilaterally. No evidence of bacterial infection or drainage bilaterally. Bleeding from hallux nail lateral border from self inflicted.  Orthopedic  No limitations of motion  feet .  No crepitus or effusions noted.  No bony pathology or digital deformities noted.  DJD 1st MPJ right foot.  Skin  normotropic skin with no porokeratosis noted bilaterally.  No signs of infections or ulcers noted.     Onychomycosis  Pain in right toes  Pain in left toes  Consent was obtained for treatment procedures.   Mechanical debridement of nails 1-5  bilaterally performed with a nail nipper.  Filed with dremel without incident. DSD applied to right hallux.   Return office visit    3 months                  Told patient to return for periodic foot care and evaluation due to potential at risk complications.   Ruffin Cotton DPM

## 2024-04-04 DIAGNOSIS — N1832 Chronic kidney disease, stage 3b: Secondary | ICD-10-CM | POA: Diagnosis not present

## 2024-04-04 DIAGNOSIS — I509 Heart failure, unspecified: Secondary | ICD-10-CM | POA: Diagnosis not present

## 2024-04-04 DIAGNOSIS — E785 Hyperlipidemia, unspecified: Secondary | ICD-10-CM | POA: Diagnosis not present

## 2024-04-04 DIAGNOSIS — I2510 Atherosclerotic heart disease of native coronary artery without angina pectoris: Secondary | ICD-10-CM | POA: Diagnosis not present

## 2024-04-04 DIAGNOSIS — E1122 Type 2 diabetes mellitus with diabetic chronic kidney disease: Secondary | ICD-10-CM | POA: Diagnosis not present

## 2024-04-04 DIAGNOSIS — J449 Chronic obstructive pulmonary disease, unspecified: Secondary | ICD-10-CM | POA: Diagnosis not present

## 2024-04-04 DIAGNOSIS — E1151 Type 2 diabetes mellitus with diabetic peripheral angiopathy without gangrene: Secondary | ICD-10-CM | POA: Diagnosis not present

## 2024-04-04 DIAGNOSIS — I129 Hypertensive chronic kidney disease with stage 1 through stage 4 chronic kidney disease, or unspecified chronic kidney disease: Secondary | ICD-10-CM | POA: Diagnosis not present

## 2024-04-04 DIAGNOSIS — E669 Obesity, unspecified: Secondary | ICD-10-CM | POA: Diagnosis not present

## 2024-04-04 DIAGNOSIS — I25118 Atherosclerotic heart disease of native coronary artery with other forms of angina pectoris: Secondary | ICD-10-CM | POA: Diagnosis not present

## 2024-04-05 NOTE — Addendum Note (Signed)
 Addended by: Toshiye Kever on: 04/05/2024 02:01 PM   Modules accepted: Orders

## 2024-04-24 ENCOUNTER — Telehealth: Payer: Self-pay | Admitting: Physician Assistant

## 2024-04-24 NOTE — Telephone Encounter (Signed)
 [name] called in stating the pt has been having dizzy spells. They last 4-5 seconds and are happening a lot more often. They are concerned of him driving. They have been monitoring his blood pressure and that doesn't seem to be the issue.

## 2024-04-25 NOTE — Telephone Encounter (Signed)
 I advised to follow up with PCP she thanked me for calling.

## 2024-04-26 DIAGNOSIS — I2510 Atherosclerotic heart disease of native coronary artery without angina pectoris: Secondary | ICD-10-CM | POA: Diagnosis not present

## 2024-04-26 DIAGNOSIS — N1832 Chronic kidney disease, stage 3b: Secondary | ICD-10-CM | POA: Diagnosis not present

## 2024-04-26 DIAGNOSIS — I25118 Atherosclerotic heart disease of native coronary artery with other forms of angina pectoris: Secondary | ICD-10-CM | POA: Diagnosis not present

## 2024-04-26 DIAGNOSIS — J449 Chronic obstructive pulmonary disease, unspecified: Secondary | ICD-10-CM | POA: Diagnosis not present

## 2024-04-27 ENCOUNTER — Other Ambulatory Visit

## 2024-05-01 DIAGNOSIS — E1169 Type 2 diabetes mellitus with other specified complication: Secondary | ICD-10-CM | POA: Diagnosis not present

## 2024-05-01 DIAGNOSIS — R2681 Unsteadiness on feet: Secondary | ICD-10-CM | POA: Diagnosis not present

## 2024-05-01 DIAGNOSIS — J449 Chronic obstructive pulmonary disease, unspecified: Secondary | ICD-10-CM | POA: Diagnosis not present

## 2024-05-01 DIAGNOSIS — H8110 Benign paroxysmal vertigo, unspecified ear: Secondary | ICD-10-CM | POA: Diagnosis not present

## 2024-05-01 DIAGNOSIS — G4733 Obstructive sleep apnea (adult) (pediatric): Secondary | ICD-10-CM | POA: Diagnosis not present

## 2024-05-02 ENCOUNTER — Ambulatory Visit
Admission: RE | Admit: 2024-05-02 | Discharge: 2024-05-02 | Disposition: A | Discharge status: Home / Self Care | Source: Ambulatory Visit | Attending: Otolaryngology | Admitting: Otolaryngology

## 2024-05-02 DIAGNOSIS — R221 Localized swelling, mass and lump, neck: Secondary | ICD-10-CM | POA: Diagnosis not present

## 2024-05-02 MED ORDER — IOPAMIDOL (ISOVUE-300) INJECTION 61%
65.0000 mL | Freq: Once | INTRAVENOUS | Status: AC | PRN
  Administered 2024-05-02: 65 mL via INTRAVENOUS

## 2024-05-05 DIAGNOSIS — E669 Obesity, unspecified: Secondary | ICD-10-CM | POA: Diagnosis not present

## 2024-05-05 DIAGNOSIS — I2510 Atherosclerotic heart disease of native coronary artery without angina pectoris: Secondary | ICD-10-CM | POA: Diagnosis not present

## 2024-05-05 DIAGNOSIS — J449 Chronic obstructive pulmonary disease, unspecified: Secondary | ICD-10-CM | POA: Diagnosis not present

## 2024-05-05 DIAGNOSIS — E1151 Type 2 diabetes mellitus with diabetic peripheral angiopathy without gangrene: Secondary | ICD-10-CM | POA: Diagnosis not present

## 2024-05-05 DIAGNOSIS — N1832 Chronic kidney disease, stage 3b: Secondary | ICD-10-CM | POA: Diagnosis not present

## 2024-05-05 DIAGNOSIS — I25118 Atherosclerotic heart disease of native coronary artery with other forms of angina pectoris: Secondary | ICD-10-CM | POA: Diagnosis not present

## 2024-05-24 ENCOUNTER — Encounter: Payer: Self-pay | Admitting: Physician Assistant

## 2024-05-24 ENCOUNTER — Ambulatory Visit (INDEPENDENT_AMBULATORY_CARE_PROVIDER_SITE_OTHER): Admitting: Physician Assistant

## 2024-05-24 ENCOUNTER — Other Ambulatory Visit: Payer: Self-pay

## 2024-05-24 VITALS — BP 141/78 | HR 89 | Resp 20 | Ht 69.0 in | Wt 203.0 lb

## 2024-05-24 DIAGNOSIS — G3184 Mild cognitive impairment, so stated: Secondary | ICD-10-CM

## 2024-05-24 DIAGNOSIS — R42 Dizziness and giddiness: Secondary | ICD-10-CM

## 2024-05-24 DIAGNOSIS — R413 Other amnesia: Secondary | ICD-10-CM

## 2024-05-24 NOTE — Patient Instructions (Addendum)
 Follow up in 6 months  Recommend to control cardiovascular risk factors Repeat Neurocognitive testing  Follow with ENT left neck mass and vertigo Recommend Hearing Aids  MRI brain [phone]

## 2024-05-24 NOTE — Progress Notes (Signed)
 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 Patient is on not on antidementia medication, prefers not to take it unless neuropsych evaluation were to yield worsening cognitive status. MMSE today is 26/30. Patient is able to participate on ADLs and to drive without difficulties. He has some complaints of what it appears to be benign positional vertigo.  Discussed vestibular therapy with ENT, he agrees to proceed. As he is due to repeat neuropsych evaluation, will repeat MRI brain, with attention to the inner ear as well.       Recommendations:   Follow up in 6  months. Repeat neuropsych evaluation for diagnostic clarity and disease trajectory Vestibular therapy for vertigo MRI brain to evaluate for any structural abnormality and vascular load, inner ear abnormality.  Follow up with ENT L neck mass Recommend good control of cardiovascular risk factors Continue to control mood as per PCP    Subjective:   This patient is accompanied in the office by his wife  who supplements the history. Previous records as well as any outside records available were reviewed prior to todays visit.   Patient was last seen on 07/04/23.   Any changes in memory since last visit? It has not changed much, but for my wife I did-wife says.  He reports having some good and bad days, maybe a little worse at times . He checked the calendar and he did not remember the name of the provider for today's visit-wife says. Short-term memory may be worse than his long-term memory.  He likes doing crossword puzzles,  watch TV, he does not participate in activities outside of his house. repeats oneself?  Endorsed, especially with appointment. Disoriented when walking into a room?  Patient denies    Misplacing objects?  Patient denies.   Wandering behavior?   Denies. Any personality changes since last visit? Denies.   Any worsening depression?: denies. Hallucinations or paranoia?  Denies.   Seizures?   Denies.    Any sleep changes? Sleeps well unless he has to get up about 3 times to go to the bathroom (he has nocturia).  Denies vivid dreams, REM behavior or sleepwalking  Sleep apnea?   Endorsed, he tried CPAP, but he did not sleep so stopped using it.   Any hygiene concerns?   Denies.   Independent of bathing and dressing?  Endorsed  Does the patient needs help with medications? Patient is in charge. Who is in charge of the finances?  Wife is in charge     Any changes in appetite?  denies     Patient have trouble swallowing?  Denies.   Does the patient cook?  Not often any kitchen accidents such as leaving the stove on?   Denies.   Any headaches?    Denies.   Vision changes? Denies. Chronic pain?  Denies.   Ambulates with difficulty?  He does not like using the cane or the walker.  He does have gait instability, and possibly BPBV If I lie to a side or lie forward the room spins.   Recent falls or head injuries?  Denies.      Unilateral weakness, numbness or tingling?  Denies.   Any tremors?  Only when nervous  Any anosmia?    Denies.   Any incontinence of urine?  Denies.  He has nocturia, somewhat improved, follows urology. Any bowel dysfunction?  Denies.      Patient lives with his wife.  Does the patient drive?  Yes, he denies any issues    Initial evaluation, Dr. Festus Hubert 2022 Since at least early 2021, his wife has noticed memory problems.  He will sometimes not be aware of things.  For example, if his wife leaves a letter on the counter to be mailed out, he may not place it in the mailbox.   If his wife tells him there is food in the refrigerator, he forgets it is there.  He sometimes forgets the day of the week but he attributes that to being retired and not needing to keep track of the day.  Sometimes he forgets how to get to certain doctor's offices.  However, he says it is because he has many doctors and has to stop and think about where they are located.  He doesn't have trouble driving to his PCP.  He doesn't typically become disoriented driving on familiar routes.  His parents didn't have memory deficits but hey both passed away at age 33.   Also, for about a year, his wife has noticed unsteady gait.  He seems to wobble from side to side when he walks.  He had a fall in January, in which he tripped on the steps.  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  He also has diabetes that has worsened over the past year, requiring medication.  Last Hgb A1c was 7.     Neurocognitive testing 06/07/2022 Dr. Kitty Perkins Briefly, results suggested performance variability across domains of processing speed, executive functioning, visuospatial abilities, and both encoding (i.e., learning) and retrieval aspects of memory. No cognitive domains exhibited consistent impairment. Relative to his previous evaluation in March 2022, performances exhibited a very large degree of stability. The only task which exhibited a measurable decline was a visuoconstructional figure drawing task. However, poor performance on this task was due to poor attention to detail rather than visuospatial impairment as he omitted several aspects entirely. Very slight decline could also be argued across a list learning memory task. However, other memory tasks exhibited evidence for mild improvement if not full stability. All other domains exhibited complete stability. 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 Numerous vascular and complex medical conditions would be expected to produce variability and/or weakness across testing in the exact areas exhibited by Mr. Pridgeon. General stability over time would also be expected. As such, this represents the most likely etiology for ongoing dysfunction. It is also worth highlighting that research has shown that individuals with Agent Orange exposure may be at an increased risk for cognitive decline. The influence of this in his current clinical presentation is plausible but unknown.      MRI of the brain December 2023, personally reviewed was remarkable for chronic small vessel ischemia and some volume loss without acute findings.     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   Myocardial infarction    Neck pain 03/05/2015   Obesity    OSA (obstructive sleep apnea) 08/09/2013    October 2014:AHI of 14 , RDI 33/h and desaturation to 88%    Osteoarthritis of left knee 04/24/2021   Osteoarthritis of right knee 04/24/2021   Pain  in joint of left shoulder 01/14/2021   Peripheral arterial disease    Pneumonia    Rotator cuff tear arthropathy 05/14/2021   Skin cancer    Thrombocytopenia    Type 2 diabetes mellitus with peripheral angiopathy    Urinary incontinence    Venous insufficiency (chronic) (peripheral)    Vitamin D  deficiency    Wheezing      Past Surgical History:  Procedure Laterality Date   bladder cancer surgery     CARDIAC CATHETERIZATION     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   albuterol  (PROVENTIL ) (2.5 MG/3ML) 0.083% nebulizer solution Take 2.5 mg by nebulization every 6 (six) hours as needed for wheezing or shortness of breath.   ASPIRIN  LOW DOSE 81 MG tablet TAKE 1 TABLET DAILY   Boswellia-Glucosamine-Vit D (OSTEO BI-FLEX ONE PER DAY PO) Take 2 tablets by mouth daily.   cetirizine (ZYRTEC) 10 MG tablet Take 10 mg by mouth daily.   Cholecalciferol  (VITAMIN D -3) 125 MCG (5000 UT) TABS Take 5,000 Units by mouth daily.   Coenzyme Q10 (CO Q-10 PO) Take 10 mg by mouth daily.   empagliflozin  (JARDIANCE ) 25 MG TABS tablet Take 25 mg by mouth daily.   furosemide  (LASIX ) 20 MG tablet Take 1 tablet (20 mg total) by mouth daily as needed for edema or fluid (take in case of weight gain 2 to 3 lbs in 24 hrs or 5 lbs in  7 days.).   ipratropium (ATROVENT ) 0.03 % nasal spray Place 2 sprays into both nostrils every 12 (twelve) hours.   metoprolol  succinate (TOPROL -XL) 25 MG 24 hr tablet Take 1 tablet (25 mg total) by mouth daily.    mirabegron  ER (MYRBETRIQ ) 50 MG TB24 tablet Take 50 mg by mouth daily.   Multiple Vitamins-Minerals (AIRBORNE) PACK Take 1 tablet by mouth 2 (two) times daily.   nitroGLYCERIN  (NITROSTAT ) 0.4 MG SL tablet Place 1  tablet (0.4 mg total) under the tongue every 5 (five) minutes as needed for chest pain.   rosuvastatin  (CRESTOR ) 40 MG tablet Take 1 tablet (40 mg total) by mouth daily.   Tiotropium Bromide -Olodaterol 2.5-2.5 MCG/ACT AERS Take 2 puffs by mouth daily.   triamcinolone  cream (KENALOG ) 0.1 % Apply 1 Application topically 2 (two) times daily as needed (Rash).   TURMERIC PO Take 1,350 mg by mouth daily.   vitamin B-12 (CYANOCOBALAMIN) 1000 MCG tablet Take 1,000 mcg by mouth daily.   No facility-administered encounter medications on file as of 05/24/2024.     Objective:     PHYSICAL EXAMINATION:    VITALS:   Vitals:   05/24/24 1052  BP: (!) 141/78  Pulse: 89  Resp: 20  SpO2: 95%  Weight: 203 lb (92.1 kg)  Height: 5' 9 (1.753 m)    GEN:  The patient appears stated age and is in NAD. HEENT:  Normocephalic, atraumatic.   Neurological examination:  General: NAD, well-groomed, appears stated age. Orientation: The patient is alert. Oriented to person, place and not to date.  Cranial nerves: There is good facial symmetry.The speech is fluent and clear. No aphasia or dysarthria. Fund of knowledge is appropriate. Recent memory impaired and remote memory is normal.  Attention and concentration are normal.  Able to name objects and repeat phrases.  Hearing is decreased to conversational tone, uses hearing aids .  Delayed recall 2/3 Sensation: Sensation is intact to light touch throughout Motor: Strength is at least antigravity x4. DTR's 2/4 in UE/LE      03/04/2021    3:00 PM  Montreal Cognitive Assessment   Visuospatial/ Executive (0/5) 2  Naming (0/3) 3  Attention: Read list of digits (0/2) 2  Attention: Read list of letters (0/1) 1  Attention: Serial 7 subtraction starting  at 100 (0/3) 3  Language: Repeat phrase (0/2) 0  Language : Fluency (0/1) 0  Abstraction (0/2) 2  Delayed Recall (0/5) 0  Orientation (0/6) 4  Total 17  Adjusted Score (based on education) 18       05/24/2024   12:00 PM 07/04/2023    6:00 PM  MMSE - Mini Mental State Exam  Orientation to time 3 3  Orientation to Place 4 5  Registration 3 3  Attention/ Calculation 5 5  Recall 2 2  Language- name 2 objects 2 2  Language- repeat 1 1  Language- follow 3 step command 3 3  Language- read & follow direction 1 1  Write a sentence 1 1  Copy design 1 0  Total score 26 26       Movement examination: Tone: There is normal tone in the UE/LE Abnormal movements: mild intention tremor R>L .  No myoclonus.  No asterixis.   Coordination:  There is no decremation with RAM's. Normal finger to nose  Gait and Station: The patient has no difficulty arising out of a deep-seated chair without the use of the hands. The patient's stride length is good. Good arm swing. Gait is cautious and mildly ide based   Thank you for allowing us  the opportunity to participate in the care of this nice patient. Please do not hesitate to contact us  for any questions or concerns.   Total time spent on today's visit was 32 minutes dedicated to this patient today, preparing to see patient, examining the patient, ordering tests and/or medications and counseling the patient, documenting clinical information in the EHR or other health record, independently interpreting results and  communicating results to the patient/family, discussing treatment and goals, answering patient's questions and coordinating care.  Cc:  Arva Lathe, MD  Tex Filbert 05/24/2024 12:15 PM

## 2024-05-26 DIAGNOSIS — I25118 Atherosclerotic heart disease of native coronary artery with other forms of angina pectoris: Secondary | ICD-10-CM | POA: Diagnosis not present

## 2024-05-26 DIAGNOSIS — I2510 Atherosclerotic heart disease of native coronary artery without angina pectoris: Secondary | ICD-10-CM | POA: Diagnosis not present

## 2024-05-26 DIAGNOSIS — J449 Chronic obstructive pulmonary disease, unspecified: Secondary | ICD-10-CM | POA: Diagnosis not present

## 2024-05-26 DIAGNOSIS — N1832 Chronic kidney disease, stage 3b: Secondary | ICD-10-CM | POA: Diagnosis not present

## 2024-05-29 ENCOUNTER — Ambulatory Visit (INDEPENDENT_AMBULATORY_CARE_PROVIDER_SITE_OTHER): Payer: Self-pay

## 2024-05-29 ENCOUNTER — Ambulatory Visit (INDEPENDENT_AMBULATORY_CARE_PROVIDER_SITE_OTHER): Admitting: Otolaryngology

## 2024-05-29 NOTE — Telephone Encounter (Signed)
 I let the patient know that he could give us  a call back to get his results.

## 2024-06-04 DIAGNOSIS — J449 Chronic obstructive pulmonary disease, unspecified: Secondary | ICD-10-CM | POA: Diagnosis not present

## 2024-06-04 DIAGNOSIS — N1832 Chronic kidney disease, stage 3b: Secondary | ICD-10-CM | POA: Diagnosis not present

## 2024-06-04 DIAGNOSIS — I25118 Atherosclerotic heart disease of native coronary artery with other forms of angina pectoris: Secondary | ICD-10-CM | POA: Diagnosis not present

## 2024-06-04 DIAGNOSIS — I2510 Atherosclerotic heart disease of native coronary artery without angina pectoris: Secondary | ICD-10-CM | POA: Diagnosis not present

## 2024-06-06 DIAGNOSIS — H8110 Benign paroxysmal vertigo, unspecified ear: Secondary | ICD-10-CM | POA: Diagnosis not present

## 2024-06-06 DIAGNOSIS — E1169 Type 2 diabetes mellitus with other specified complication: Secondary | ICD-10-CM | POA: Diagnosis not present

## 2024-06-06 DIAGNOSIS — I10 Essential (primary) hypertension: Secondary | ICD-10-CM | POA: Diagnosis not present

## 2024-06-06 DIAGNOSIS — N1832 Chronic kidney disease, stage 3b: Secondary | ICD-10-CM | POA: Diagnosis not present

## 2024-06-06 DIAGNOSIS — I252 Old myocardial infarction: Secondary | ICD-10-CM | POA: Diagnosis not present

## 2024-06-06 DIAGNOSIS — G3184 Mild cognitive impairment, so stated: Secondary | ICD-10-CM | POA: Diagnosis not present

## 2024-06-13 NOTE — Therapy (Incomplete)
 OUTPATIENT PHYSICAL THERAPY VESTIBULAR EVALUATION     Patient Name: Steven Newman MRN: [id number] DOB:May 15, 1941, 83 y.o., male Today's Date: 06/13/2024  END OF SESSION:   Past Medical History:  Diagnosis Date   Abdominal aortic aneurysm 11/21/2014   Abnormal gait 05/30/2021   Atherosclerotic heart disease of native coronary artery without angina pectoris    coronary angioplasty 1995   Benign essential hypertension 05/30/2021   Bilateral hip bursitis 04/24/2021   Bilateral sensorineural hearing loss 05/30/2021   Calculus of kidney 05/30/2021   Chronic kidney disease, stage 3b 05/30/2021   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   Myocardial infarction    Neck pain 03/05/2015   Obesity    OSA (obstructive sleep apnea) 08/09/2013    October 2014:AHI of 14 , RDI 33/h and desaturation to 88%    Osteoarthritis of left knee 04/24/2021   Osteoarthritis of right knee 04/24/2021   Pain in joint of left shoulder 01/14/2021   Peripheral arterial disease    Pneumonia    Rotator cuff tear arthropathy 05/14/2021   Skin cancer    Thrombocytopenia    Type 2 diabetes mellitus with peripheral angiopathy    Urinary incontinence    Venous insufficiency (chronic) (peripheral)    Vitamin D  deficiency    Wheezing    Past Surgical History:  Procedure Laterality Date   bladder cancer surgery     CARDIAC CATHETERIZATION     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     REVERSE SHOULDER ARTHROPLASTY Left 08/07/2021    Procedure: REVERSE SHOULDER ARTHROPLASTY;  Surgeon: Kay Kemps, MD;  Location: [facility];  Service: Orthopedics;  Laterality: Left;  with ISB   Patient Active Problem List   Diagnosis Date Noted   Heart failure (HCC) 02/08/2024   Arthritis of left knee 02/08/2024   Coronary artery disease 02/08/2024   Chronic kidney disease, stage 3a (HCC) 02/05/2024   History of abdominal aortic aneurysm (AAA) 02/05/2024   Type 2 diabetes mellitus with hyperlipidemia (HCC) 02/05/2024   Acute on chronic combined systolic and diastolic CHF (congestive heart failure) (HCC) 02/05/2024   Generalized weakness 02/05/2024   History of COPD 02/05/2024   BPH (benign prostatic hyperplasia) 02/05/2024   Acute on chronic systolic CHF (congestive heart failure) (HCC) 02/04/2024   Tinnitus of both ears 10/19/2023   History of artificial lens replacement    Exposure to Agent Orange    History of adenomatous polyp of colon    Myocardial infarction    Microalbuminuria    Peripheral artery disease (HCC)    Thrombocytopenia    Urinary incontinence    Venous insufficiency (chronic) (peripheral)    Vitamin D  deficiency    Hypercholesterolemia    History of total shoulder replacement, left 08/07/2021   Abnormal gait 05/30/2021   Essential hypertension 05/30/2021   Sensorineural hearing loss, bilateral 05/30/2021   Chronic kidney disease, stage 3b 05/30/2021   Rotator cuff tear arthropathy  05/14/2021   Bilateral hip bursitis 04/24/2021   Osteoarthritis of left knee 04/24/2021   Osteoarthritis of right knee 04/24/2021   Mild neurocognitive disorder 03/04/2021   Pain in joint of left shoulder 01/14/2021   Neck pain 03/05/2015   Abdominal aortic aneurysm 11/21/2014   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  ONSET DATE: ***  Rationale for Evaluation and Treatment: {HABREHAB:27488}  SUBJECTIVE:   SUBJECTIVE STATEMENT: *** Pt accompanied by: {accompnied:27141}  PERTINENT HISTORY: Steven Newman is a delightful 83 y.o. RH male with a history of CAD status post MI, COPD, hypertension, hyperlipidemia,CHF, CKD, DM2, possible OSA not on CPAP, history of bladder cancer, prior agent orange exposure, and a history of MCI   PAIN:  Are you having pain? {OPRCPAIN:27236}  PRECAUTIONS: {Therapy precautions:24002}  RED FLAGS: {PT Red Flags:29287}   WEIGHT BEARING RESTRICTIONS: {Yes ***/No:24003}  FALLS: Has patient fallen in last 6 months? {fallsyesno:27318}  LIVING ENVIRONMENT: Lives with: {OPRC lives with:25569::lives with their family} Lives in: {Lives in:25570} Stairs: {opstairs:27293} Has following equipment at home: {Assistive devices:23999}  PLOF: {PLOF:24004}  PATIENT GOALS: ***  OBJECTIVE:  Note: Objective measures were completed at Evaluation unless otherwise noted.  DIAGNOSTIC FINDINGS: ***  COGNITION: Overall cognitive status: {cognition:24006}   SENSATION: {sensation:27233}  EDEMA:  {edema:24020}  MUSCLE TONE:  {LE tone:25568}  DTRs:  {DTR SITE:24025}  POSTURE:  {posture:25561}  Cervical ROM:    {AROM/PROM:27142} A/PROM (deg) eval  Flexion   Extension   Right lateral flexion   Left lateral flexion   Right rotation   Left rotation   (Blank rows = not tested)  STRENGTH: ***  LOWER EXTREMITY MMT:   MMT Right eval Left eval  Hip flexion    Hip abduction    Hip adduction    Hip internal rotation    Hip external rotation    Knee flexion    Knee extension    Ankle dorsiflexion    Ankle plantarflexion    Ankle inversion     Ankle eversion    (Blank rows = not tested)  BED MOBILITY:  {Bed mobility:24027}  TRANSFERS: Assistive device utilized: {Assistive devices:23999}  Sit to stand: {Levels of assistance:24026} Stand to sit: {Levels of assistance:24026} Chair to chair: {Levels of assistance:24026} Floor: {Levels of assistance:24026}  RAMP: {Levels of assistance:24026}  CURB: {Levels of assistance:24026}  GAIT: Gait pattern: {gait characteristics:25376} Distance walked: *** Assistive device utilized: {Assistive devices:23999} Level of assistance: {Levels of assistance:24026} Comments: ***  FUNCTIONAL TESTS:  {Functional tests:24029}  PATIENT SURVEYS:  {rehab surveys:24030}  VESTIBULAR ASSESSMENT:  GENERAL OBSERVATION: ***   SYMPTOM BEHAVIOR:  Subjective history: ***  Non-Vestibular symptoms: {nonvestibular symptoms:25260}  Type of dizziness: {Type of Dizziness:25255}  Frequency: ***  Duration: ***  Aggravating factors: {Aggravating Factors:25258}  Relieving factors: {Relieving Factors:25259}  Progression of symptoms: {DESC; BETTER/WORSE:18575}  OCULOMOTOR EXAM:  Ocular Alignment: {Ocular Alignment:25262}  Ocular ROM: {RANGE OF MOTION:21649}  Spontaneous Nystagmus: {Spontaneous nystagmus:25263}  Gaze-Induced Nystagmus: {gaze-induced nystagmus:25264}  Smooth Pursuits: {smooth pursuit:25265}  Saccades: {saccades:25266}  Convergence/Divergence: *** cm  VESTIBULAR - OCULAR REFLEX:   Slow VOR: {slow VOR:25290}  VOR Cancellation: {vor cancellation:25291}  Head-Impulse Test: {head impulse test:25272}  Dynamic Visual Acuity: {dynamic visual acuity:25273}   POSITIONAL TESTING: {Positional tests:25271}  MOTION SENSITIVITY:  Motion Sensitivity Quotient Intensity: 0 = none, 1 = Lightheaded, 2 = Mild, 3 = Moderate, 4 = Severe, 5 = Vomiting  Intensity  1. Sitting to supine   2. Supine to L side   3. Supine to R side   4. Supine to sitting   5. L Hallpike-Dix   6. Up from L     7. R Hallpike-Dix   8. Up from R    9. Sitting, head tipped to L knee   10. Head up from L knee   11. Sitting, head tipped to R knee   12. Head up from R knee   13. Sitting head turns x5   14.Sitting head nods x5   15. In stance, 180 turn to L    16. In stance, 180 turn to R     OTHOSTATICS: {Exam; orthostatics:31331}  FUNCTIONAL GAIT: {Functional tests:24029}                                                                                                                             TREATMENT DATE: ***   Canalith Repositioning:  {Canalith Repositioning:25283} Gaze Adaptation:  {gaze adaptation:25286} Habituation:  {habituation:25288} Other: ***  PATIENT EDUCATION: Education details: *** Person educated: {Person educated:25204} Education method: {Education Method:25205} Education comprehension: {Education Comprehension:25206}  HOME EXERCISE PROGRAM:  GOALS: Goals reviewed with patient? {yes/no:20286}  SHORT TERM GOALS: Target date: ***  *** Baseline: Goal status: {GOALSTATUS:25110}  2.  *** Baseline:  Goal status: {GOALSTATUS:25110}  3.  *** Baseline:  Goal status: {GOALSTATUS:25110}  4.  *** Baseline:  Goal status: {GOALSTATUS:25110}  5.  *** Baseline:  Goal status: {GOALSTATUS:25110}  6.  *** Baseline:  Goal status: {GOALSTATUS:25110}  LONG TERM GOALS: Target date: ***  *** Baseline:  Goal status: {GOALSTATUS:25110}  2.  *** Baseline:  Goal status: {GOALSTATUS:25110}  3.  *** Baseline:  Goal status: {GOALSTATUS:25110}  4.  *** Baseline:  Goal status: {GOALSTATUS:25110}  5.  *** Baseline:  Goal status: {GOALSTATUS:25110}  6.  *** Baseline:  Goal status: {GOALSTATUS:25110}  ASSESSMENT:  CLINICAL IMPRESSION: Patient is a *** y.o. *** who was seen today for physical therapy evaluation and treatment for ***.   OBJECTIVE IMPAIRMENTS: {opptimpairments:25111}.   ACTIVITY LIMITATIONS:  {activitylimitations:27494}  PARTICIPATION LIMITATIONS: {participationrestrictions:25113}  PERSONAL FACTORS: {Personal factors:25162} are also affecting patient's functional outcome.   REHAB POTENTIAL: {rehabpotential:25112}  CLINICAL DECISION MAKING: {clinical decision making:25114}  EVALUATION COMPLEXITY: {Evaluation complexity:25115}   PLAN:  PT FREQUENCY: {rehab frequency:25116}  PT DURATION: {rehab duration:25117}  PLANNED INTERVENTIONS: {rehab planned interventions:25118::97110-Therapeutic exercises,97530- Therapeutic [phone]- Neuromuscular re-education,97535- Self Rjmz,02859- Manual therapy}  PLAN FOR NEXT SESSION: ***   Almetta Fam, PT 06/13/2024, 8:27 AM

## 2024-06-14 ENCOUNTER — Ambulatory Visit

## 2024-06-23 ENCOUNTER — Ambulatory Visit
Admission: RE | Admit: 2024-06-23 | Discharge: 2024-06-23 | Disposition: A | Discharge status: Home / Self Care | Source: Ambulatory Visit | Attending: Physician Assistant | Admitting: Physician Assistant

## 2024-06-23 DIAGNOSIS — R9082 White matter disease, unspecified: Secondary | ICD-10-CM | POA: Diagnosis not present

## 2024-06-23 DIAGNOSIS — R42 Dizziness and giddiness: Secondary | ICD-10-CM | POA: Diagnosis not present

## 2024-06-25 DIAGNOSIS — N1832 Chronic kidney disease, stage 3b: Secondary | ICD-10-CM | POA: Diagnosis not present

## 2024-06-25 DIAGNOSIS — I25118 Atherosclerotic heart disease of native coronary artery with other forms of angina pectoris: Secondary | ICD-10-CM | POA: Diagnosis not present

## 2024-06-25 DIAGNOSIS — I2510 Atherosclerotic heart disease of native coronary artery without angina pectoris: Secondary | ICD-10-CM | POA: Diagnosis not present

## 2024-06-25 DIAGNOSIS — J449 Chronic obstructive pulmonary disease, unspecified: Secondary | ICD-10-CM | POA: Diagnosis not present

## 2024-06-26 ENCOUNTER — Ambulatory Visit (INDEPENDENT_AMBULATORY_CARE_PROVIDER_SITE_OTHER): Admitting: Otolaryngology

## 2024-06-26 VITALS — BP 134/74 | HR 98

## 2024-06-26 DIAGNOSIS — R09A9 Foreign body sensation, other site: Secondary | ICD-10-CM | POA: Diagnosis not present

## 2024-06-26 DIAGNOSIS — R490 Dysphonia: Secondary | ICD-10-CM

## 2024-06-26 DIAGNOSIS — H903 Sensorineural hearing loss, bilateral: Secondary | ICD-10-CM

## 2024-06-26 DIAGNOSIS — R42 Dizziness and giddiness: Secondary | ICD-10-CM

## 2024-06-26 DIAGNOSIS — H9313 Tinnitus, bilateral: Secondary | ICD-10-CM

## 2024-06-26 DIAGNOSIS — J300 Vasomotor rhinitis: Secondary | ICD-10-CM

## 2024-06-26 DIAGNOSIS — R221 Localized swelling, mass and lump, neck: Secondary | ICD-10-CM

## 2024-06-26 DIAGNOSIS — J3089 Other allergic rhinitis: Secondary | ICD-10-CM

## 2024-06-26 DIAGNOSIS — R0982 Postnasal drip: Secondary | ICD-10-CM

## 2024-06-26 DIAGNOSIS — R0981 Nasal congestion: Secondary | ICD-10-CM

## 2024-06-26 DIAGNOSIS — J383 Other diseases of vocal cords: Secondary | ICD-10-CM

## 2024-06-26 NOTE — Progress Notes (Signed)
 ENT Progress Note:   Update 06/26/2024  Discussed the use of AI scribe software for clinical note transcription with the patient, who gave verbal consent to proceed.  History of Present Illness  Steven Newman is an 83 year old male who presents for f/u after neck CT.   CT did not identify masses or lymphadenopathy, but showed atherosclerosis in carotid arteries.   He has been experiencing dizzy spells characterized by a sensation of the room spinning, particularly when getting up to go to the bathroom at night. These episodes lasted about five seconds and persisted for approximately three weeks before resolving. He has not experienced any dizziness for the past two to three months.  He has a history of vascular issues, with recent imaging showing some plaques in the arteries. He has an upcoming appointment with his vascular doctor next month.  He also experiences chronic neck and back pain.   Records Reviewed:  Initial Evaluation  Reason for Consult: left neck mass x 8-9 mo   HPI: Discussed the use of AI scribe software for clinical note transcription with the patient, who gave verbal consent to proceed.  History of Present Illness Steven Newman is an 83 year old male who presents with a recurrent left neck mass. He was referred by Dr. Karis for evaluation of the neck mass.  He had a left neck mass first noticed in 1999, initially diagnosed as non-cancerous and surgically removed at [facility]. Over the past 8 to 9 months, the mass has regrown, located deep in the neck as a 'regular lump' without pain or skin changes. No imaging has been done for the current mass. He is not sure what the exact diagnosis was when the mass was removed at [facility] in 1999.   He has a history of smoking, quitting approximately five years ago. He has chronic heart failure/CKD, with a recent hospitalization four weeks ago, and is scheduled for follow-up labs and an appointment with his nephrologist,  Dr. Macel, due to his kidney disease.   He has a long-standing raspy voice, not associated with the neck mass, and a history of skin cancer on the face and scalp, though the type is unknown. He also has a past diagnosis of bladder cancer, which he considers irrelevant to the current issue.  He experiences constant nasal dripping and takes cetirizine (Zyrtec) at night for it. He has a history of allergic reactions to penicillin and possibly steroids, which caused facial swelling (inhaled steroids). He has COPD.  Records Reviewed:  02/05/24 admission H&P from hospital admission Steven Newman is a 83 y.o. male with medical history significant of CAD, previous MI, combined CHF with reduced EF 40 to 45%, peripheral arterial disease, non-insulin -dependent DM type II, essential hypertension, AAA, CKD 3 AA OSA not on CPAP, history of bladder cancer, prior history of agent orange exposure and hyperlipidemia presented to emergency department complaining of bilateral lower extremity weakness, rash, dyspnea on exertion and bilateral lower extremity swelling. During my evaluation at the bedside patient reported that he has dyspnea on exertion and paroxysmal nocturnal dyspnea as well.  Currently have to use 2-3 pillows to lift himself up during sleep.  Denies any chest pain, palpitation, headache, blurry vision, diaphoresis, nausea, change of appetite and weight.  Complaining about bilateral lower extremities pain more severe on the left side as compared to right, bilateral lower extremities pain aggravated by walking and improves with rest.  Patient denies any postprandial abdominal and periumbilical pain. Denies any noticing  black tarry stool or bright red per rectum.   He is also stating that he has history of skin cancer of his forehead and face 2-3 times and currently has been using topical regimen and his wife going to bring to show us  tomorrow.  Unable to find any record on the chart. Unable to verify with  patient's which medication he has been taking or not.  Patient is alert oriented and answering all the questions appropriately.        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   Myocardial infarction    Neck pain 03/05/2015   Obesity    OSA (obstructive sleep apnea) 08/09/2013    October 2014:AHI of 14 , RDI 33/h and desaturation to 88%    Osteoarthritis of left knee 04/24/2021   Osteoarthritis of right knee 04/24/2021   Pain in joint of left shoulder 01/14/2021   Peripheral arterial disease    Pneumonia    Rotator cuff tear arthropathy 05/14/2021   Skin cancer    Thrombocytopenia    Type 2 diabetes mellitus with peripheral angiopathy    Urinary incontinence    Venous insufficiency (chronic) (peripheral)    Vitamin D  deficiency    Wheezing     Past Surgical History:  Procedure Laterality Date   bladder cancer surgery     CARDIAC CATHETERIZATION     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 His smoking use included cigarettes. He started smoking about 59 years ago. He has a 100 pack-year smoking history. He has never used smokeless tobacco. He reports current alcohol use. He reports that he does not use drugs.  Allergies:  Allergies  Allergen Reactions   Ace Inhibitors Other (See Comments)    Angioedema   Amoxicillin-Pot Clavulanate Hives   Penicillins Swelling and Rash    Lips swell    Medications: I have reviewed the patient's current medications.  The PMH, PSH, Medications, Allergies, and SH were reviewed and updated.  ROS: Constitutional: Negative for fever, weight loss and weight gain. Cardiovascular: Negative for chest pain and dyspnea on exertion. Respiratory: Is not experiencing shortness of breath at rest. Gastrointestinal: Negative for nausea and vomiting. Neurological: Negative for headaches. Psychiatric: The patient is not nervous/anxious  Blood pressure 134/74, pulse 98, SpO2 93%. There is no height or weight on file to calculate BMI.  PHYSICAL EXAM:  Exam: General: Well-developed, well-nourished Respiratory Respiratory effort: Equal inspiration and expiration without stridor Cardiovascular Peripheral Vascular: Warm extremities with equal color/perfusion Eyes: No nystagmus with equal extraocular motion bilaterally Neuro/Psych/Balance: Patient oriented to person, place, and time; Appropriate mood and affect; Gait is intact with no imbalance; Cranial nerves I-XII are intact Head and Face Inspection: Normocephalic and atraumatic without mass or lesion Palpation: Facial skeleton intact without bony  stepoffs Salivary Glands: No mass or tenderness  Facial Strength: Facial motility symmetric and full bilaterally ENT Pinna: External ear intact and fully developed External canal: Canal is patent with intact skin Tympanic Membrane: Clear and mobile External Nose: No scar or anatomic deformity Internal Nose: Septum is relatively straight on anterior rhinoscopy. Bilateral inferior turbinate hypertrophy.  Lips, Teeth, and gums: Mucosa and teeth intact and viable Oral cavity/oropharynx: No erythema or exudate, no lesions present Neck Neck and Trachea: Midline trachea without mass or lesion Thyroid : No mass or nodularity Lymphatics: No lymphadenopathy   Studies Reviewed: MRI cervical spine (done for ataxia) 1. Normal MRI appearance of the cervical spinal cord. No cord signal changes to suggest myelopathy or significant spinal stenosis. 2. Multilevel cervical spondylosis with associated moderate to severe bilateral C4 through C7 foraminal narrowing as above. 3. Moderate multilevel facet hypertrophy as above. Findings could contribute to underlying neck pain.  CT neck w/contrast 05/02/24 CLINICAL DATA:  Left neck mass. History of resection of a reportedly benign left neck mass in the same location in 1999.   EXAM: CT NECK WITH CONTRAST   TECHNIQUE: Multidetector CT imaging of the neck was performed using the standard protocol following the bolus administration of intravenous contrast.   RADIATION DOSE REDUCTION: This exam was performed according to the departmental dose-optimization program which includes automated exposure control, adjustment of the mA and/or kV according to patient size and/or use of iterative reconstruction technique.   CONTRAST:  65mL ISOVUE -300 IOPAMIDOL  (ISOVUE -300) INJECTION 61%   COMPARISON:  None Available.   FINDINGS: Pharynx and larynx: No evidence of a mass or swelling. Patent airway. No parapharyngeal or retropharyngeal fluid collection  or inflammation.   Salivary glands: Small left parotid gland, suspected to reflect the sequelae of prior partial parotidectomy with mild regional scarring. No evidence of a mass. Unremarkable appearance of the right parotid and both submandibular glands.   Thyroid : Unremarkable.   Lymph nodes: No enlarged or suspicious lymph nodes in the neck. 2 subcentimeter subcutaneous nodules overlying left sternocleidomastoid muscle, possibly small lymph nodes.   Vascular: Major vessels of the neck appear patent. Prominent atherosclerosis at the carotid bifurcations with possible moderate to severe stenoses of both ICA origins.   Limited intracranial: Unremarkable.   Visualized orbits: Bilateral cataract extraction.   Mastoids and visualized paranasal sinuses: Clear.   Skeleton: No suspicious lesion. Advanced upper cervical facet arthrosis.   Upper chest: Clear lung apices. Partially visualized circumferential esophageal wall thickening, nonspecific but may reflect esophagitis.   Other: None.   IMPRESSION: 1. No mass, lymphadenopathy, or acute abnormality identified in the neck. 2. Carotid atherosclerosis with possible moderate to severe stenoses of both ICA origins. This could be further  evaluated by carotid ultrasound.   Assessment/Plan: Encounter Diagnoses  Name Primary?   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 No records available for review.  Recurrent neck mass in the same location x 8-9 mo, no associated pain, no skin changes, denies dysphagia odynophagia, hemoptysis,  has had raspy voice for a long time, before he noticed neck mass. Neck mass is palpable on exam ~ 2 cm in size, soft, mobile, without induration or skin changes. This is likely lymph node vs lipoma cannot rule out neoplasm vs infectious process, etiology unclear. CT scan needed for further evaluation. Smoking is a risk factor for malignancy. - Order CT scan of the neck with contrast, pending nephrologist clearance to have IV contrast 2/2 hx of CKD - patient will check with nephrology to ensure he is ok to have contrast with this scan  Chronic rhinitis/post-nasal drainage Long-standing nasal drippage, likely allergic or non-allergic rhinitis (such as vasomotor rhinitis).  - Prescribe Atrovent  nasal spray.  - will hold off on Flonase  2/2 hx of allergic reaction to steroid inhaler in the past  Hoarseness Raspy voice for several years. Flexible scope exam with VF atrophy and glottic insufficiency and changes c/w GERD LPR, but no masses or lesions. 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  - Offer referral to voice specialist for therapy if desired - he would like to hold off at this time  Follow-up Follow-up needed to review CT scan results and evaluate neck mass. - Schedule follow-up appointment post-CT scan.   Update 06/26/2024 Assessment and Plan Assessment & Plan Sensation of Left neck mass  CT neck negative for masses or lymphadenopathy. Previous scope exam is clear. No palpable masses on exam. Patient was reassured.  - no evidence of a neck mass on exam or imaging   Dizziness Intermittent nocturnal dizziness with brief vertigo episodes. Differential includes BPPV vs vascular issues vs neurologic issues. Arterial plaques noted on CT neck, further evaluation required. - Refer to vestibular therapist  - Continue workup with neurology and primary care physician. Ask for Duplex U/S when discussing with PCP - currently asymptomatic      Elena Larry, MD Otolaryngology [facility] Phone: [phone] Fax: [phone]    06/26/2024, 4:29 PM

## 2024-06-27 ENCOUNTER — Ambulatory Visit: Payer: Self-pay | Admitting: Physician Assistant

## 2024-06-29 ENCOUNTER — Other Ambulatory Visit (HOSPITAL_COMMUNITY): Payer: Self-pay | Admitting: Cardiovascular Disease

## 2024-07-03 ENCOUNTER — Ambulatory Visit: Payer: Self-pay | Admitting: Physician Assistant

## 2024-07-03 ENCOUNTER — Encounter: Payer: Self-pay | Admitting: Physician Assistant

## 2024-07-04 ENCOUNTER — Ambulatory Visit (INDEPENDENT_AMBULATORY_CARE_PROVIDER_SITE_OTHER): Admitting: Podiatry

## 2024-07-04 ENCOUNTER — Encounter: Payer: Self-pay | Admitting: Podiatry

## 2024-07-04 DIAGNOSIS — N1832 Chronic kidney disease, stage 3b: Secondary | ICD-10-CM | POA: Diagnosis not present

## 2024-07-04 DIAGNOSIS — E1151 Type 2 diabetes mellitus with diabetic peripheral angiopathy without gangrene: Secondary | ICD-10-CM | POA: Diagnosis not present

## 2024-07-04 DIAGNOSIS — B351 Tinea unguium: Secondary | ICD-10-CM

## 2024-07-04 DIAGNOSIS — E1169 Type 2 diabetes mellitus with other specified complication: Secondary | ICD-10-CM

## 2024-07-04 NOTE — Progress Notes (Signed)
 This patient returns to my office for at risk foot care.  This patient requires this care by a professional since this patient will be at risk due to having type 2 diabetes, CKD and swelling right foot.  This patient is unable to cut nails himself since the patient cannot reach his nails.These nails are painful walking and wearing shoes.  This patient presents for at risk foot care today.  General Appearance  Alert, conversant and in no acute stress.  Vascular  Dorsalis pedis and posterior tibial  pulses are  weakly palpable  bilaterally.  Capillary return is within normal limits  bilaterally. Temperature is within normal limits  bilaterally.  Neurologic  Senn-Weinstein monofilament wire test within normal limits  bilaterally. Muscle power within normal limits bilaterally.  Nails Thick disfigured discolored nails with subungual debris  from hallux to fifth toes bilaterally. No evidence of bacterial infection or drainage bilaterally. Bleeding from hallux nail lateral border from self inflicted.  Orthopedic  No limitations of motion  feet .  No crepitus or effusions noted.  No bony pathology or digital deformities noted.  DJD 1st MPJ right foot.  Skin  normotropic skin with no porokeratosis noted bilaterally.  No signs of infections or ulcers noted.     Onychomycosis  Pain in right toes  Pain in left toes  Consent was obtained for treatment procedures.   Mechanical debridement of nails 1-5  bilaterally performed with a nail nipper.  Filed with dremel without incident. DSD applied to right hallux.   Return office visit    3 months                  Told patient to return for periodic foot care and evaluation due to potential at risk complications.  Cordella See Beharry/SM

## 2024-07-05 ENCOUNTER — Ambulatory Visit (HOSPITAL_COMMUNITY)
Admission: RE | Admit: 2024-07-05 | Discharge: 2024-07-05 | Disposition: A | Discharge status: Home / Self Care | Source: Ambulatory Visit | Attending: Cardiovascular Disease | Admitting: Cardiovascular Disease

## 2024-07-05 ENCOUNTER — Ambulatory Visit (HOSPITAL_COMMUNITY)
Admission: RE | Admit: 2024-07-05 | Discharge: 2024-07-05 | Discharge status: Home / Self Care | Source: Ambulatory Visit | Attending: Cardiovascular Disease | Admitting: Cardiovascular Disease

## 2024-07-05 DIAGNOSIS — I6523 Occlusion and stenosis of bilateral carotid arteries: Secondary | ICD-10-CM | POA: Diagnosis not present

## 2024-07-05 DIAGNOSIS — N1832 Chronic kidney disease, stage 3b: Secondary | ICD-10-CM | POA: Diagnosis not present

## 2024-07-05 DIAGNOSIS — I77811 Abdominal aortic ectasia: Secondary | ICD-10-CM | POA: Diagnosis not present

## 2024-07-05 DIAGNOSIS — I714 Abdominal aortic aneurysm, without rupture, unspecified: Secondary | ICD-10-CM | POA: Insufficient documentation

## 2024-07-05 DIAGNOSIS — E669 Obesity, unspecified: Secondary | ICD-10-CM | POA: Diagnosis not present

## 2024-07-05 DIAGNOSIS — I2510 Atherosclerotic heart disease of native coronary artery without angina pectoris: Secondary | ICD-10-CM | POA: Diagnosis not present

## 2024-07-05 DIAGNOSIS — E1151 Type 2 diabetes mellitus with diabetic peripheral angiopathy without gangrene: Secondary | ICD-10-CM | POA: Diagnosis not present

## 2024-07-05 DIAGNOSIS — J449 Chronic obstructive pulmonary disease, unspecified: Secondary | ICD-10-CM | POA: Diagnosis not present

## 2024-07-05 DIAGNOSIS — I25118 Atherosclerotic heart disease of native coronary artery with other forms of angina pectoris: Secondary | ICD-10-CM | POA: Diagnosis not present

## 2024-07-11 ENCOUNTER — Ambulatory Visit: Payer: Self-pay | Admitting: Cardiovascular Disease

## 2024-07-11 DIAGNOSIS — I714 Abdominal aortic aneurysm, without rupture, unspecified: Secondary | ICD-10-CM

## 2024-07-11 DIAGNOSIS — I6523 Occlusion and stenosis of bilateral carotid arteries: Secondary | ICD-10-CM

## 2024-07-13 DIAGNOSIS — H8110 Benign paroxysmal vertigo, unspecified ear: Secondary | ICD-10-CM | POA: Diagnosis not present

## 2024-07-13 DIAGNOSIS — J449 Chronic obstructive pulmonary disease, unspecified: Secondary | ICD-10-CM | POA: Diagnosis not present

## 2024-07-13 DIAGNOSIS — J309 Allergic rhinitis, unspecified: Secondary | ICD-10-CM | POA: Diagnosis not present

## 2024-07-13 DIAGNOSIS — E1151 Type 2 diabetes mellitus with diabetic peripheral angiopathy without gangrene: Secondary | ICD-10-CM | POA: Diagnosis not present

## 2024-07-13 DIAGNOSIS — J029 Acute pharyngitis, unspecified: Secondary | ICD-10-CM | POA: Diagnosis not present

## 2024-07-25 DIAGNOSIS — J449 Chronic obstructive pulmonary disease, unspecified: Secondary | ICD-10-CM | POA: Diagnosis not present

## 2024-07-25 DIAGNOSIS — I25118 Atherosclerotic heart disease of native coronary artery with other forms of angina pectoris: Secondary | ICD-10-CM | POA: Diagnosis not present

## 2024-07-25 DIAGNOSIS — I2510 Atherosclerotic heart disease of native coronary artery without angina pectoris: Secondary | ICD-10-CM | POA: Diagnosis not present

## 2024-07-25 DIAGNOSIS — N1832 Chronic kidney disease, stage 3b: Secondary | ICD-10-CM | POA: Diagnosis not present

## 2024-08-05 DIAGNOSIS — J449 Chronic obstructive pulmonary disease, unspecified: Secondary | ICD-10-CM | POA: Diagnosis not present

## 2024-08-05 DIAGNOSIS — I25118 Atherosclerotic heart disease of native coronary artery with other forms of angina pectoris: Secondary | ICD-10-CM | POA: Diagnosis not present

## 2024-08-05 DIAGNOSIS — I2510 Atherosclerotic heart disease of native coronary artery without angina pectoris: Secondary | ICD-10-CM | POA: Diagnosis not present

## 2024-08-05 DIAGNOSIS — E1151 Type 2 diabetes mellitus with diabetic peripheral angiopathy without gangrene: Secondary | ICD-10-CM | POA: Diagnosis not present

## 2024-08-05 DIAGNOSIS — E669 Obesity, unspecified: Secondary | ICD-10-CM | POA: Diagnosis not present

## 2024-08-05 DIAGNOSIS — N1832 Chronic kidney disease, stage 3b: Secondary | ICD-10-CM | POA: Diagnosis not present

## 2024-08-10 DIAGNOSIS — N3941 Urge incontinence: Secondary | ICD-10-CM | POA: Diagnosis not present

## 2024-08-10 DIAGNOSIS — R351 Nocturia: Secondary | ICD-10-CM | POA: Diagnosis not present

## 2024-08-23 DIAGNOSIS — N1832 Chronic kidney disease, stage 3b: Secondary | ICD-10-CM | POA: Diagnosis not present

## 2024-08-23 DIAGNOSIS — I2510 Atherosclerotic heart disease of native coronary artery without angina pectoris: Secondary | ICD-10-CM | POA: Diagnosis not present

## 2024-08-23 DIAGNOSIS — I509 Heart failure, unspecified: Secondary | ICD-10-CM | POA: Diagnosis not present

## 2024-08-23 DIAGNOSIS — E1151 Type 2 diabetes mellitus with diabetic peripheral angiopathy without gangrene: Secondary | ICD-10-CM | POA: Diagnosis not present

## 2024-08-23 DIAGNOSIS — E669 Obesity, unspecified: Secondary | ICD-10-CM | POA: Diagnosis not present

## 2024-08-23 LAB — HEMOGLOBIN A1C: Hemoglobin A1C: 7

## 2024-08-24 DIAGNOSIS — I2510 Atherosclerotic heart disease of native coronary artery without angina pectoris: Secondary | ICD-10-CM | POA: Diagnosis not present

## 2024-08-24 DIAGNOSIS — J449 Chronic obstructive pulmonary disease, unspecified: Secondary | ICD-10-CM | POA: Diagnosis not present

## 2024-08-24 DIAGNOSIS — I25118 Atherosclerotic heart disease of native coronary artery with other forms of angina pectoris: Secondary | ICD-10-CM | POA: Diagnosis not present

## 2024-08-24 DIAGNOSIS — N1832 Chronic kidney disease, stage 3b: Secondary | ICD-10-CM | POA: Diagnosis not present

## 2024-08-28 ENCOUNTER — Ambulatory Visit: Payer: Self-pay | Admitting: Cardiovascular Disease

## 2024-08-28 ENCOUNTER — Ambulatory Visit: Attending: Cardiovascular Disease | Admitting: Cardiovascular Disease

## 2024-08-28 ENCOUNTER — Encounter: Payer: Self-pay | Admitting: Cardiovascular Disease

## 2024-08-28 VITALS — BP 110/80 | HR 76 | Ht 68.0 in | Wt 194.6 lb

## 2024-08-28 DIAGNOSIS — E785 Hyperlipidemia, unspecified: Secondary | ICD-10-CM | POA: Insufficient documentation

## 2024-08-28 DIAGNOSIS — I714 Abdominal aortic aneurysm, without rupture, unspecified: Secondary | ICD-10-CM | POA: Diagnosis not present

## 2024-08-28 DIAGNOSIS — I6523 Occlusion and stenosis of bilateral carotid arteries: Secondary | ICD-10-CM | POA: Diagnosis not present

## 2024-08-28 DIAGNOSIS — I2510 Atherosclerotic heart disease of native coronary artery without angina pectoris: Secondary | ICD-10-CM | POA: Diagnosis not present

## 2024-08-28 NOTE — Progress Notes (Signed)
 se    Cardiology Office Note   Date:  08/28/2024   ID:  Steven Newman, DOB 07/31/1941, MRN [id number]  PCP:  Elliot Charm, MD  Cardiologist:  Dr. Darron  No chief complaint on file.     History of Present Illness: Steven Newman is a 83 y.o. male who is here today for a follow-up visit regarding peripheral arterial disease.  He has known history of coronary artery disease status post MI and multiple PCI in the mid 90s. No recent revascularization. He is a previous smoker and quit in 2016. Other chronic medical conditions include hypertension, hyperlipidemia and obesity.  He has known history of abdominal aortic aneurysm, right common iliac artery aneurysm as well as peripheral arterial disease.   Most recent vascular studies in February showed an ABI of 0.66 on the right and 0.84 on the left.    Most recent duplex evaluation in July showed slight increase in abdominal aortic aneurysm to 3.6 cm with right common iliac artery of 2 cm and left common iliac artery of 2.2 cm.  He is known to have moderate bilateral carotid stenosis worse on the left side but still below 80%.  He has been doing well with no chest pain, shortness of breath or palpitations.  He has been bothered by knee joint arthritis but no significant claudication.  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   Myocardial infarction    Neck pain 03/05/2015   Obesity    OSA (obstructive sleep apnea) 08/09/2013    October 2014:AHI of 14 , RDI 33/h and desaturation to 88%    Osteoarthritis of left knee 04/24/2021   Osteoarthritis of right knee 04/24/2021   Pain in joint of left shoulder 01/14/2021   Peripheral arterial disease    Pneumonia    Rotator cuff tear arthropathy 05/14/2021   Skin cancer    Thrombocytopenia    Type 2 diabetes mellitus with peripheral angiopathy    Urinary incontinence    Venous insufficiency (chronic) (peripheral)    Vitamin D  deficiency    Wheezing     Past Surgical History:  Procedure Laterality Date   bladder cancer surgery     CARDIAC CATHETERIZATION     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     albuterol  (PROVENTIL ) (2.5 MG/3ML) 0.083% nebulizer solution Take 2.5 mg by nebulization every 6 (six) hours as needed for wheezing or shortness of breath.     ASPIRIN  LOW DOSE 81 MG tablet TAKE 1 TABLET DAILY 90 tablet 3   Boswellia-Glucosamine-Vit D (OSTEO BI-FLEX ONE PER DAY PO) Take 2 tablets by mouth daily.     cetirizine (ZYRTEC) 10 MG tablet Take 10 mg by mouth daily.     Cholecalciferol  (VITAMIN  D-3) 125 MCG (5000 UT) TABS Take 5,000 Units by mouth daily.     Coenzyme Q10 (CO Q-10 PO) Take 10 mg by mouth daily.     empagliflozin  (JARDIANCE ) 25 MG TABS tablet Take 25 mg by mouth daily.     furosemide  (LASIX ) 20 MG tablet Take 1 tablet (20 mg total) by mouth daily as needed for edema or fluid (take in case of weight gain 2 to 3 lbs in 24 hrs or 5 lbs in  7 days.). 30 tablet 0   hydrocortisone (ANUSOL-HC) 2.5 % rectal cream as needed.     ipratropium (ATROVENT ) 0.03 % nasal spray Place 2 sprays into both nostrils every 12 (twelve) hours. 30 mL 12   metoprolol  succinate (TOPROL -XL) 25 MG 24 hr tablet Take 1 tablet (25 mg total) by mouth daily. 90 tablet 3   mirabegron  ER (MYRBETRIQ ) 50 MG TB24 tablet Take 50 mg by mouth daily.     Multiple Vitamins-Minerals (AIRBORNE) PACK Take 1 tablet by mouth 2 (two) times daily.     nitroGLYCERIN  (NITROSTAT ) 0.4 MG SL tablet Place 1 tablet (0.4 mg total) under the tongue every 5 (five) minutes as needed for chest pain. 25 tablet 3   rosuvastatin  (CRESTOR ) 40 MG tablet TAKE 1 TABLET BY MOUTH DAILY 90 tablet 2   Tiotropium Bromide -Olodaterol 2.5-2.5 MCG/ACT AERS Take 2 puffs by mouth daily.     triamcinolone  cream (KENALOG ) 0.1 % Apply 1 Application topically 2 (two) times daily as needed (Rash).     TURMERIC PO Take 1,350 mg by mouth daily.     vitamin B-12 (CYANOCOBALAMIN) 1000 MCG tablet Take 1,000 mcg by mouth daily.     Vibegron (GEMTESA) 75 MG TABS daily.     No current facility-administered medications for this visit.    Allergies:   Ace inhibitors, Amoxicillin-pot clavulanate, and Penicillins    Social History:  The patient  reports that he quit smoking about 9 years ago. His smoking use included cigarettes. He started smoking about 59 years ago. He has a 100 pack-year smoking history. He has never used smokeless tobacco. He reports current alcohol use. He reports that he does not use drugs.   Family History:  The patient's family history  includes Cancer in his sister; Diabetes in his sister; Heart disease in his mother; Rheumatic fever in his brother and father.    ROS:  Please see the history of present illness.   Otherwise, review of systems are positive for none.   All other systems are reviewed and negative.    PHYSICAL EXAM: VS:  BP 110/80   Pulse 76   Ht 5' 8 (1.727 m)   Wt 194 lb 9.6 oz (88.3 kg)   SpO2 93%   BMI 29.59 kg/m  , BMI Body mass  index is 29.59 kg/m. GEN: Well nourished, well developed, in no acute distress  HEENT: normal  Neck: no JVD or masses.  Right carotid bruit Cardiac: RRR; no rubs, or gallops,no edema .  1 / 6 systolic murmur in the aortic area.   Respiratory:  clear to auscultation bilaterally, normal work of breathing GI: soft, nontender, nondistended, + BS MS: no deformity or atrophy  Skin: warm and dry, no rash Neuro:  Strength and sensation are intact Psych: euthymic mood, full affect Vascular: Femoral pulse: Slightly diminished bilaterally.  Distal pulses are palpable on the left but not the right   EKG:  EKG is not ordered today. EKG showed: Sinus rhythm with 1st degree A-V block Left axis deviation Right bundle branch block Inferior infarct , age undetermined       Recent Labs: 02/04/2024: B Natriuretic Peptide 896.6 02/05/2024: ALT 36 02/06/2024: TSH 3.764 02/09/2024: BUN 61; Creatinine, Ser 1.97; Hemoglobin 14.4; Platelets 192; Potassium 4.1; Sodium 136    Lipid Panel    Component Value Date/Time   CHOL 113 02/05/2024 0237   CHOL 159 12/23/2014 0923   TRIG 93 02/05/2024 0237   TRIG 129 12/23/2014 0923   HDL 51 02/05/2024 0237   HDL 45 12/23/2014 0923   CHOLHDL 2.2 02/05/2024 0237   VLDL 19 02/05/2024 0237   LDLCALC 43 02/05/2024 0237   LDLCALC 88 12/23/2014 0923      Wt Readings from Last 3 Encounters:  08/28/24 194 lb 9.6 oz (88.3 kg)  05/24/24 203 lb (92.1 kg)  03/21/24 194 lb (88 kg)          01/25/2017    9:25 AM  PAD Screen  Previous PAD dx? No   Previous surgical procedure? Yes  Dates of procedures hx of bladder cancer  Pain with walking? Yes  Subsides with rest? Yes  Feet/toe relief with dangling? No  Painful, non-healing ulcers? No  Extremities discolored? Yes      ASSESSMENT AND PLAN:  1.  Abdominal aortic/right common iliac artery aneurysm: Relatively stable over the last year.  Repeat studies in July of next year.  Still below the threshold for repair.    2. Peripheral arterial disease: The patient has known left external iliac artery stenosis as well as right popliteal artery disease.  Ino significant claudication at this time.  3. Hyperlipidemia: He is doing well with rosuvastatin  40 mg daily.  Most recent lipid profile showed an LDL of 43.  4.  Coronary artery disease involving native coronary arteries without angina: Continue medical therapy.  5.  Moderate bilateral carotid disease: Continue treatment of risk factors and repeat carotid Doppler in July 2026.  6.  Chronic kidney disease: Followed by nephrology with most recent creatinine of 1.97.   Disposition:   FU with me in 6 months.  Signed,  Deatrice Cage, MD  08/28/2024 10:42 AM    [facility]

## 2024-08-28 NOTE — Patient Instructions (Addendum)
 Medication Instructions:  Your physician recommends that you continue on your current medications as directed. Please refer to the Current Medication list given to you today.  *If you need a refill on your cardiac medications before your next appointment, please call your pharmacy*  Lab Work: NONE  If you have labs (blood work) drawn today and your tests are completely normal, you will receive your results only by: MyChart Message (if you have MyChart) OR A paper copy in the mail If you have any lab test that is abnormal or we need to change your treatment, we will call you to review the results.  Testing/Procedures: NONE  Follow-Up: At [facility], you and your health needs are our priority.  As part of our continuing mission to provide you with exceptional heart care, our providers are all part of one team.  This team includes your primary Cardiologist (physician) and Advanced Practice Providers or APPs (Physician Assistants and Nurse Practitioners) who all work together to provide you with the care you need, when you need it.  Your next appointment:   6 months  Provider:   Deatrice Cage, MD

## 2024-09-04 DIAGNOSIS — E1151 Type 2 diabetes mellitus with diabetic peripheral angiopathy without gangrene: Secondary | ICD-10-CM | POA: Diagnosis not present

## 2024-09-04 DIAGNOSIS — J449 Chronic obstructive pulmonary disease, unspecified: Secondary | ICD-10-CM | POA: Diagnosis not present

## 2024-09-04 DIAGNOSIS — I2510 Atherosclerotic heart disease of native coronary artery without angina pectoris: Secondary | ICD-10-CM | POA: Diagnosis not present

## 2024-09-04 DIAGNOSIS — E669 Obesity, unspecified: Secondary | ICD-10-CM | POA: Diagnosis not present

## 2024-09-04 DIAGNOSIS — N1832 Chronic kidney disease, stage 3b: Secondary | ICD-10-CM | POA: Diagnosis not present

## 2024-09-04 DIAGNOSIS — I25118 Atherosclerotic heart disease of native coronary artery with other forms of angina pectoris: Secondary | ICD-10-CM | POA: Diagnosis not present

## 2024-09-20 DIAGNOSIS — Z23 Encounter for immunization: Secondary | ICD-10-CM | POA: Diagnosis not present

## 2024-09-23 DIAGNOSIS — I2510 Atherosclerotic heart disease of native coronary artery without angina pectoris: Secondary | ICD-10-CM | POA: Diagnosis not present

## 2024-09-23 DIAGNOSIS — N1832 Chronic kidney disease, stage 3b: Secondary | ICD-10-CM | POA: Diagnosis not present

## 2024-09-23 DIAGNOSIS — I25118 Atherosclerotic heart disease of native coronary artery with other forms of angina pectoris: Secondary | ICD-10-CM | POA: Diagnosis not present

## 2024-09-23 DIAGNOSIS — J449 Chronic obstructive pulmonary disease, unspecified: Secondary | ICD-10-CM | POA: Diagnosis not present

## 2024-09-24 DIAGNOSIS — N1832 Chronic kidney disease, stage 3b: Secondary | ICD-10-CM | POA: Diagnosis not present

## 2024-10-04 ENCOUNTER — Ambulatory Visit: Admitting: Podiatry

## 2024-10-04 DIAGNOSIS — E785 Hyperlipidemia, unspecified: Secondary | ICD-10-CM | POA: Diagnosis not present

## 2024-10-04 DIAGNOSIS — N1832 Chronic kidney disease, stage 3b: Secondary | ICD-10-CM | POA: Diagnosis not present

## 2024-10-04 DIAGNOSIS — E1122 Type 2 diabetes mellitus with diabetic chronic kidney disease: Secondary | ICD-10-CM | POA: Diagnosis not present

## 2024-10-04 DIAGNOSIS — I129 Hypertensive chronic kidney disease with stage 1 through stage 4 chronic kidney disease, or unspecified chronic kidney disease: Secondary | ICD-10-CM | POA: Diagnosis not present

## 2024-10-04 DIAGNOSIS — R296 Repeated falls: Secondary | ICD-10-CM | POA: Diagnosis not present

## 2024-10-04 DIAGNOSIS — I509 Heart failure, unspecified: Secondary | ICD-10-CM | POA: Diagnosis not present

## 2024-10-05 DIAGNOSIS — J449 Chronic obstructive pulmonary disease, unspecified: Secondary | ICD-10-CM | POA: Diagnosis not present

## 2024-10-05 DIAGNOSIS — I2510 Atherosclerotic heart disease of native coronary artery without angina pectoris: Secondary | ICD-10-CM | POA: Diagnosis not present

## 2024-10-05 DIAGNOSIS — E1151 Type 2 diabetes mellitus with diabetic peripheral angiopathy without gangrene: Secondary | ICD-10-CM | POA: Diagnosis not present

## 2024-10-05 DIAGNOSIS — I25118 Atherosclerotic heart disease of native coronary artery with other forms of angina pectoris: Secondary | ICD-10-CM | POA: Diagnosis not present

## 2024-10-05 DIAGNOSIS — N1832 Chronic kidney disease, stage 3b: Secondary | ICD-10-CM | POA: Diagnosis not present

## 2024-10-05 DIAGNOSIS — E669 Obesity, unspecified: Secondary | ICD-10-CM | POA: Diagnosis not present

## 2024-10-08 ENCOUNTER — Encounter: Payer: Self-pay | Admitting: Podiatry

## 2024-10-08 ENCOUNTER — Ambulatory Visit: Admitting: Podiatry

## 2024-10-08 DIAGNOSIS — E1169 Type 2 diabetes mellitus with other specified complication: Secondary | ICD-10-CM

## 2024-10-08 DIAGNOSIS — E1151 Type 2 diabetes mellitus with diabetic peripheral angiopathy without gangrene: Secondary | ICD-10-CM | POA: Diagnosis not present

## 2024-10-08 DIAGNOSIS — B351 Tinea unguium: Secondary | ICD-10-CM

## 2024-10-08 DIAGNOSIS — N1832 Chronic kidney disease, stage 3b: Secondary | ICD-10-CM

## 2024-10-08 NOTE — Progress Notes (Signed)
 This patient returns to my office for at risk foot care.  This patient requires this care by a professional since this patient will be at risk due to having type 2 diabetes, CKD and swelling right foot.  This patient is unable to cut nails himself since the patient cannot reach his nails.These nails are painful walking and wearing shoes.  This patient presents for at risk foot care today.  General Appearance  Alert, conversant and in no acute stress.  Vascular  Dorsalis pedis and posterior tibial  pulses are  weakly palpable  bilaterally.  Capillary return is within normal limits  bilaterally. Temperature is within normal limits  bilaterally.  Neurologic  Senn-Weinstein monofilament wire test within normal limits  bilaterally. Muscle power within normal limits bilaterally.  Nails Thick disfigured discolored nails with subungual debris  from hallux to fifth toes bilaterally. No evidence of bacterial infection or drainage bilaterally. Bleeding from hallux nail lateral border from self inflicted.  Orthopedic  No limitations of motion  feet .  No crepitus or effusions noted.  No bony pathology or digital deformities noted.  DJD 1st MPJ right foot.  Skin  normotropic skin with no porokeratosis noted bilaterally.  No signs of infections or ulcers noted.     Onychomycosis  Pain in right toes  Pain in left toes  Consent was obtained for treatment procedures.   Mechanical debridement of nails 1-5  bilaterally performed with a nail nipper.  Filed with dremel without incident. DSD applied to right hallux.   Return office visit    3 months                  Told patient to return for periodic foot care and evaluation due to potential at risk complications.  Cordella See Beharry/SM

## 2024-10-09 DIAGNOSIS — R351 Nocturia: Secondary | ICD-10-CM | POA: Diagnosis not present

## 2024-10-09 DIAGNOSIS — N3281 Overactive bladder: Secondary | ICD-10-CM | POA: Diagnosis not present

## 2024-10-23 DIAGNOSIS — N1832 Chronic kidney disease, stage 3b: Secondary | ICD-10-CM | POA: Diagnosis not present

## 2024-10-23 DIAGNOSIS — I2510 Atherosclerotic heart disease of native coronary artery without angina pectoris: Secondary | ICD-10-CM | POA: Diagnosis not present

## 2024-10-23 DIAGNOSIS — J449 Chronic obstructive pulmonary disease, unspecified: Secondary | ICD-10-CM | POA: Diagnosis not present

## 2024-10-23 DIAGNOSIS — I25118 Atherosclerotic heart disease of native coronary artery with other forms of angina pectoris: Secondary | ICD-10-CM | POA: Diagnosis not present

## 2024-11-04 DIAGNOSIS — I2510 Atherosclerotic heart disease of native coronary artery without angina pectoris: Secondary | ICD-10-CM | POA: Diagnosis not present

## 2024-11-04 DIAGNOSIS — I25118 Atherosclerotic heart disease of native coronary artery with other forms of angina pectoris: Secondary | ICD-10-CM | POA: Diagnosis not present

## 2024-11-04 DIAGNOSIS — N1832 Chronic kidney disease, stage 3b: Secondary | ICD-10-CM | POA: Diagnosis not present

## 2024-11-04 DIAGNOSIS — J449 Chronic obstructive pulmonary disease, unspecified: Secondary | ICD-10-CM | POA: Diagnosis not present

## 2024-11-04 DIAGNOSIS — E669 Obesity, unspecified: Secondary | ICD-10-CM | POA: Diagnosis not present

## 2024-11-04 DIAGNOSIS — E1151 Type 2 diabetes mellitus with diabetic peripheral angiopathy without gangrene: Secondary | ICD-10-CM | POA: Diagnosis not present

## 2024-11-21 NOTE — Progress Notes (Signed)
 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 Patient is not on antidementia medication, prefers not to take it unless neuropsych evaluation were to yield worsening cognitive status. ***. This patient is accompanied in the office by ***  who supplements the history. Previous records as well as any outside records available were reviewed prior to todays visit.   Patient was last seen on ***. Memory is ***. MMSE today is  /30.Patient is able to participate on ADLs and to to drive without difficulties. Mood is ***  No indication for antidementia medication at this time Repeat neuropsych evaluation for diagnostic clarity and disease trajectory Continue to control mood as per PCP Recommend good control of cardiovascular risk factors Follow-up carotid artery disease he has been referred to further studies by primary care physician Folllow up in 5  months      Discussed the use of AI scribe software for clinical note transcription with the patient, who gave verbal consent to proceed.  History of Present Illness Discussed the use of AI scribe software for clinical note transcription with the patient, who gave verbal consent to proceed.     Any changes in memory since last visit? It has not changed much, but for my wife I did-wife says.  He reports having some good and bad days, maybe a little worse at times . He checked the calendar and he did not remember the name of the provider for today's visit-wife says. Short-term memory may be worse than his long-term memory.  He likes doing crossword puzzles, watch TV, he  does not participate in activities outside of his house. repeats oneself?  Endorsed, especially with appointment. Disoriented when walking into a room?  Patient denies    Misplacing objects?  Patient denies.   Wandering behavior?   Denies. Any personality changes since last visit? Denies.   Any worsening depression?: denies. Hallucinations or paranoia?  Denies.   Seizures?   Denies.    Any sleep changes? Sleeps well unless he has to get up about 3 times to go to the bathroom (he has nocturia).  Denies vivid dreams, REM behavior or sleepwalking  Sleep apnea?   Endorsed, he tried CPAP, but he did not sleep so stopped using it.   Any hygiene concerns?   Denies.   Independent of bathing and dressing?  Endorsed  Does the patient needs help with medications? Patient is in charge. Who is in charge of the finances?  Wife is in charge     Any changes in appetite?  denies     Patient have trouble swallowing?  Denies.   Does the patient cook?  Not often any kitchen accidents such as leaving the stove on?   Denies.   Any headaches?    Denies.   Vision changes? Denies. Chronic pain?  Denies.   Ambulates with difficulty?  He does not like using the cane or the walker.  He does have gait instability, and possibly BPBV If I lie to a side or lie forward the room spins.   Recent falls or head injuries?    Denies.  Unilateral weakness, numbness or tingling?  Denies.   Any tremors?  Only when nervous  Any anosmia?    Denies.   Any incontinence of urine?  He has OAB, urge incontinence, he has nocturia, somewhat improved, follows urology. Any bowel dysfunction?  Denies.      Patient lives with his wife.  Does the patient drive?  Yes, he denies any issues    Initial evaluation, Dr. Skeet 2022 Since at least early 2021, his wife has noticed memory problems.  He will sometimes not be aware of things.  For example, if his wife leaves a letter on the counter to be mailed out, he may not place it in the  mailbox.  If his wife tells him there is food in the refrigerator, he forgets it is there.  He sometimes forgets the day of the week but he attributes that to being retired and not needing to keep track of the day.  Sometimes he forgets how to get to certain doctor's offices.  However, he says it is because he has many doctors and has to stop and think about where they are located.  He doesn't have trouble driving to his PCP.  He doesn't typically become disoriented driving on familiar routes.  His parents didn't have memory deficits but hey both passed away at age 53.   Also, for about a year, his wife has noticed unsteady gait.  He seems to wobble from side to side when he walks.  He had a fall in January, in which he tripped on the steps.  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  He also has diabetes that has worsened over the past year, requiring medication.  Last Hgb A1c was 7.     Neurocognitive testing 06/07/2022 Dr. Richie Briefly, results suggested performance variability across domains of processing speed, executive functioning, visuospatial abilities, and both encoding (i.e., learning) and retrieval aspects of memory. No cognitive domains exhibited consistent impairment. Relative to his previous evaluation in March 2022, performances exhibited a very large degree of stability. The only task which exhibited a measurable decline was a visuoconstructional figure drawing task. However, poor performance on this task was due to poor attention to detail rather than visuospatial impairment as he omitted several aspects entirely. Very slight decline could also be argued across a list learning memory task. However, other memory tasks exhibited evidence for mild improvement if not full stability. All other domains exhibited complete stability. Recent neuroimaging revealed mild to moderate microvascular ischemic disease and he has numerous medical ailments (both cardiovascular and otherwise) which  can negatively influence cognitive functioning. Numerous vascular and complex medical conditions would be expected to produce variability and/or weakness across testing in the exact areas exhibited by Mr. Hudlow. General stability over time would also be expected. As such, this represents the most likely etiology for ongoing dysfunction. It is also worth highlighting that research has shown that individuals with Agent Orange exposure may be at an increased risk for cognitive decline. The influence of this in his current clinical presentation is plausible but unknown.      MRI of the brain December 2023, personally reviewed was remarkable for chronic small vessel ischemia and some volume loss without acute findings.  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, unchanged, moderate to advanced generalized cerebral atrophy, left C2--C3  facet ankylosis, no evidence of acute intracranial abnormalities      Past Medical History:  Diagnosis Date   Abdominal aortic aneurysm 11/21/2014   Abnormal gait 05/30/2021   Atherosclerotic heart disease of native coronary artery without angina pectoris    coronary angioplasty 1995   Benign essential hypertension 05/30/2021   Bilateral hip bursitis 04/24/2021   Bilateral sensorineural hearing loss 05/30/2021   Calculus of kidney 05/30/2021   Chronic kidney disease, stage 3b 05/30/2021   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   Myocardial infarction    Neck pain 03/05/2015   Obesity    OSA (obstructive sleep apnea) 08/09/2013    October 2014:AHI of 14 , RDI 33/h and desaturation to 88%    Osteoarthritis of left knee 04/24/2021   Osteoarthritis of right knee 04/24/2021   Pain in joint of left shoulder 01/14/2021   Peripheral arterial disease    Pneumonia    Rotator cuff tear arthropathy 05/14/2021   Skin cancer    Thrombocytopenia    Type 2 diabetes mellitus with peripheral angiopathy    Urinary incontinence    Venous insufficiency (chronic) (peripheral)    Vitamin D  deficiency    Wheezing      Past Surgical History:  Procedure Laterality Date   bladder cancer surgery     CARDIAC CATHETERIZATION     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  GEN:  The patient appears stated age and is in NAD. HEENT:  Normocephalic, atraumatic.   Neurological examination:  General: NAD, well-groomed, appears stated age. Orientation: The patient is alert. Oriented to person, place and not to date.*** Cranial nerves: There is good facial symmetry.The speech is fluent and clear. No aphasia or dysarthria. Fund of knowledge is appropriate. Recent memory impaired and remote memory is normal.  Attention and concentration are normal.  Able to name objects and repeat phrases.  Hearing is intact to conversational tone ***.   Delayed recall *** Sensation: Sensation is intact to light touch throughout Motor: Strength is at least antigravity x4. DTR's 2/4 in UE/LE      03/04/2021    3:00 PM   Montreal Cognitive Assessment   Visuospatial/ Executive (0/5) 2  Naming (0/3) 3  Attention: Read list of digits (0/2) 2  Attention: Read list of letters (0/1) 1  Attention: Serial 7 subtraction starting at 100 (0/3) 3  Language: Repeat phrase (0/2) 0  Language : Fluency (0/1) 0  Abstraction (0/2) 2  Delayed Recall (0/5) 0  Orientation (0/6) 4  Total 17  Adjusted Score (based on education) 18  05/24/2024   12:00 PM 07/04/2023    6:00 PM  MMSE - Mini Mental State Exam  Orientation to time 3 3  Orientation to Place 4 5  Registration 3 3  Attention/ Calculation 5 5  Recall 2 2  Language- name 2 objects 2 2  Language- repeat 1 1  Language- follow 3 step command 3 3  Language- read & follow direction 1 1  Write a sentence 1 1  Copy design 1 0  Total score 26 26      Movement examination: Tone: There is normal tone in the UE/LE Abnormal movements: Mild intention right greater than left tremor.  No myoclonus.  No asterixis.   Coordination:  There is no decremation with RAM's. Normal finger to nose  Gait and Station: The patient has no difficulty arising out of a deep-seated chair without the use of the hands. The patient's stride length is good.  Gait is cautious and mildly wide-based Thank you for allowing us  the opportunity to participate in the care of this nice patient. Please do not hesitate to contact us  for any questions or concerns.   Total time spent on today's visit was *** minutes dedicated to this patient today, preparing to see patient, examining the patient, ordering tests and/or medications and counseling the patient, documenting clinical information in the EHR or other health record, independently interpreting results and communicating results to the patient/family, discussing treatment and goals, answering patient's questions and coordinating care.  Cc:  Elliot Charm, MD  Camie Sevin 11/21/2024 6:31 AM

## 2024-11-22 ENCOUNTER — Ambulatory Visit: Admitting: Physician Assistant

## 2024-11-22 ENCOUNTER — Encounter: Payer: Self-pay | Admitting: Physician Assistant

## 2024-11-22 VITALS — BP 119/67 | HR 66 | Resp 20 | Wt 196.0 lb

## 2024-11-22 DIAGNOSIS — I10 Essential (primary) hypertension: Secondary | ICD-10-CM | POA: Diagnosis not present

## 2024-11-22 DIAGNOSIS — R413 Other amnesia: Secondary | ICD-10-CM

## 2024-11-22 DIAGNOSIS — G3184 Mild cognitive impairment, so stated: Secondary | ICD-10-CM | POA: Diagnosis not present

## 2024-11-22 DIAGNOSIS — E1169 Type 2 diabetes mellitus with other specified complication: Secondary | ICD-10-CM | POA: Diagnosis not present

## 2024-11-22 DIAGNOSIS — N1832 Chronic kidney disease, stage 3b: Secondary | ICD-10-CM | POA: Diagnosis not present

## 2024-11-22 NOTE — Patient Instructions (Signed)
 Follow up June 30 at 11:30  Repeat Neurocognitive testing  Recommend Hearing Aids  Recommend  using the CPAP

## 2024-11-23 ENCOUNTER — Telehealth: Payer: Self-pay | Admitting: Cardiovascular Disease

## 2024-11-23 MED ORDER — METOPROLOL SUCCINATE ER 25 MG PO TB24: 25.0000 mg | ORAL_TABLET | Freq: Every day | ORAL | 0 refills | Status: AC

## 2024-11-23 NOTE — Telephone Encounter (Signed)
Requested Prescriptions   Signed Prescriptions Disp Refills   metoprolol succinate (TOPROL-XL) 25 MG 24 hr tablet 90 tablet 0    Sig: Take 1 tablet (25 mg total) by mouth daily.    Authorizing Provider: ARIDA, MUHAMMAD A    Ordering User: Torrell Krutz C    

## 2024-11-23 NOTE — Telephone Encounter (Signed)
" °*  STAT* If patient is at the pharmacy, call can be transferred to refill team.   1. Which medications need to be refilled? (please list name of each medication and dose if known) metoprolol  succinate (TOPROL -XL) 25 MG 24 hr tablet   2. Which pharmacy/location (including street and city if local pharmacy) is medication to be sent to?  [facility] [place] [place] - [address]      3. Do they need a 30 day or 90 day supply? 90  "

## 2024-12-17 ENCOUNTER — Encounter: Payer: Self-pay | Admitting: Psychology

## 2024-12-17 DIAGNOSIS — M6281 Muscle weakness (generalized): Secondary | ICD-10-CM | POA: Insufficient documentation

## 2024-12-17 DIAGNOSIS — E66811 Obesity, class 1: Secondary | ICD-10-CM | POA: Insufficient documentation

## 2024-12-18 ENCOUNTER — Ambulatory Visit

## 2024-12-18 ENCOUNTER — Encounter: Payer: Self-pay | Admitting: Psychology

## 2024-12-18 ENCOUNTER — Ambulatory Visit: Admitting: Psychology

## 2024-12-18 DIAGNOSIS — F0670 Mild neurocognitive disorder due to known physiological condition without behavioral disturbance: Secondary | ICD-10-CM

## 2024-12-18 DIAGNOSIS — R4189 Other symptoms and signs involving cognitive functions and awareness: Secondary | ICD-10-CM

## 2024-12-18 NOTE — Progress Notes (Unsigned)
 "   NEUROPSYCHOLOGICAL EVALUATION [facility] Department of Neurology  Date of Evaluation: December 18, 2024  Reason for Referral:   JAME MORRELL is a 84 y.o. right-handed Caucasian male referred by Camie Sevin, PA-C, to characterize his current cognitive functioning and assist with diagnostic clarity and treatment planning in the context of subjective cognitive decline.   Assessment and Plan:   Clinical Impression(s): Mr. Blanke pattern of performance is suggestive of performance variability surrounding processing speed, executive functioning, visuospatial abilities, and recognition/consolidation aspects of memory. A relative weakness was also identified across both encoding (i.e., learning) and delayed retrieval aspects of memory. Performances were appropriate relative to age-matched peers across attention/concentration, safety/judgment, and both receptive and expressive language. Functionally, he is likely on the border between mild and major neurocognitive disorder classifications. He continues to drive but requires navigational assistance when outside of local areas. He likewise continues to reportedly be independent with medication management; however, recent confusional episodes have prompted his wife to provide additional supervision. 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 However, this would be at the severe end of this spectrum and he has moved closer to a dementia classification relative to previous evaluation time points given some reported functional decline.  Relative to his previous evaluations in March 2022 and July 2023, an isolated decline was exhibited across a figure drawing task. More subtle decline is also argued across processing speed, basic attention, and executive functioning. Specific to memory, retention rates in 2022 across verbal memory  tasks were 0%, 50%, and 50% across list, story, and daily living tasks respectively. They were 0%, 44%, and 73% currently. While this continues to suggest overall memory impairment, performances are fairly similar between the two assessed time points.   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 He also has numerous medical ailments, both cardiovascular and otherwise, which can negatively influence cognitive functioning. Numerous vascular and complex medical conditions would be reasonably expected to produce variability and/or weakness across testing in the areas exhibited by Mr. Sirico. A decent degree of stability over time would also be expected. As such, this represents the most likely etiology for ongoing dysfunction. It is also worth highlighting that research has shown that individuals with Agent Orange exposure may be at an increased risk for cognitive decline. The influence of this in his current clinical presentation is plausible but unknown. Despite memory dysfunction/impairment, patterns across testing do not suggest underlying Alzheimer's disease in a particularly compelling fashion in my opinion.  Recommendations: If interested, Mr. Sheeley could discuss memory medications with Ms. Wertman. It is important to highlight that these medications have been shown to slow functional decline in some individuals. There is no current treatment which can stop or reverse cognitive decline when caused by a neurodegenerative illness.   Performance across neurocognitive testing is not a strong predictor of an individual's safety operating a motor vehicle. Should his family wish to pursue a formalized driving evaluation, they could reach out to the following agencies: [employer] [facility] in [place]: [phone] [facility]: [phone] [facility]: [phone] [facility]: [phone] or  [phone]  Should there be progression of current deficits over time, Mr. Rape is unlikely to regain any independent living skills lost. Therefore, it is recommended that he remain as involved as possible in all aspects of household chores, finances, and medication management, with supervision to  ensure adequate performance. He will likely benefit from the establishment and maintenance of a routine in order to maximize his functional abilities over time.  It will be important for Mr. Dais to have another person with him when in situations where he may need to process information, weigh the pros and cons of different options, and make decisions, in order to ensure that he fully understands and recalls all information to be considered.  If not already done, Mr. Auld and his family may want to discuss his wishes regarding durable power of attorney and medical decision making, so that he can have input into these choices. If they require legal assistance with this, long-term care resource access, or other aspects of estate planning, they could reach out to [employer] at [phone] for a free consultation.   Mr. Heaphy is encouraged to attend to lifestyle factors for brain health (e.g., regular physical exercise, good nutrition habits and consideration of the MIND-DASH diet, regular participation in cognitively-stimulating activities, and general stress management techniques), which are likely to have benefits for both emotional adjustment and cognition. Optimal control of vascular risk factors (including safe cardiovascular exercise and adherence to dietary recommendations) is encouraged. Continued participation in activities which provide mental stimulation and social interaction is also recommended.   Memory can be improved using internal strategies such as rehearsal, repetition, chunking, mnemonics, association, and imagery. External strategies such as written notes in a consistently used  memory journal, visual and nonverbal auditory cues such as a calendar on the refrigerator or appointments with alarm, such as on a cell phone, can also help maximize recall.    When learning new information, he would benefit from information being broken up into small, manageable pieces. He may find it helpful to articulate the material in his own words and in a context to promote encoding at the onset of a new task. This material may need to be repeated multiple times to promote encoding. He may also understand and retain new information better if it is presented to him in a meaningful or well-organized manner at the outset, such as grouping items into meaningful categories or presenting information in an outlined, bulleted, or story format.  To address problems with processing speed, he may wish to consider:   -Ensuring that he is alerted when essential material or instructions are being presented   -Adjusting the speed at which new information is presented   -Allowing for more time in comprehending, processing, and responding in conversation   -Repeating and paraphrasing instructions or conversations aloud  To address problems with fluctuating attention and/or executive dysfunction, he may wish to consider:   -Avoiding external distractions when needing to concentrate   -Limiting exposure to fast paced environments with multiple sensory demands   -Writing down complicated information and using checklists   -Attempting and completing one task at a time (i.e., no multi-tasking)   -Verbalizing aloud each step of a task to maintain focus   -Taking frequent breaks during the completion of steps/tasks to avoid fatigue   -Reducing the amount of information considered at one time   -Scheduling more difficult activities for a time of day where he is usually most alert  Review of Records:   Mr. Cheema completed a comprehensive neuropsychological evaluation Marshal Rattler, Psy.D.) on 03/04/2021. Results  suggested ongoing memory deficits, particularly surrounding encoding (i.e., learning) and retrieval aspects. He did demonstrate some ability to retain information and did not exhibit a complete storage impairment. He also exhibited some difficulties  across processing speed. 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 Repeat testing was recommended.   He completed a second neuropsychological evaluation with myself on 06/25/2022. Results suggested performance variability across domains of processing speed, executive functioning, visuospatial abilities, and both encoding (i.e., learning) and retrieval aspects of memory. No cognitive domains exhibited consistent impairment. Performances were appropriate relative to age-matched peers across domains of attention/concentration, safety/judgment, receptive and expressive language, and recognition/consolidation aspects of memory. Relative to his previous evaluation in March 2022, performances exhibited a very large degree of stability. The only task which exhibited a measurable decline was a visuoconstructional figure drawing task. However, poor performance on this task was due to poor attention to detail rather than visuospatial impairment as he omitted several aspects entirely. Very slight decline could also be argued across a list learning memory task. However, other memory tasks exhibited evidence for mild improvement if not full stability. All other domains exhibited stability.  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 09/2015 Hgb 16.5   Coronary artery disease 02/08/2024   Dyspnea    Erectile dysfunction    Exposure to Agent Orange    History of adenomatous polyp of colon    History of artificial lens replacement    History of bladder cancer    History of skin cancer    History of total shoulder replacement, left 08/07/2021   Hypercholesterolemia    Hyperlipidemia associated with type 2 diabetes mellitus 02/05/2024   Microalbuminuria    Mild neurocognitive disorder due to multiple etiologies 03/04/2021   Muscle weakness (generalized)    Myocardial infarction    Neck pain 03/05/2015   Obesity    OSA (obstructive sleep apnea) 08/09/2013   No CPAP use; AHI of 14, RDI 33/h and desaturation to 88%   Osteoarthritis of left knee 04/24/2021   Osteoarthritis of right knee 04/24/2021   Pain in joint of left shoulder 01/14/2021   Peripheral arterial disease    Peripheral artery disease    Pneumonia    Rotator cuff tear arthropathy 05/14/2021   Sensorineural hearing loss, bilateral 05/30/2021   Thrombocytopenia    Type 2 diabetes mellitus with peripheral angiopathy    Urinary incontinence    Venous insufficiency (chronic) (peripheral)    Vitamin D  deficiency    Wheezing     Past Surgical History:  Procedure Laterality Date   bladder cancer surgery     CARDIAC CATHETERIZATION     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     REVERSE SHOULDER ARTHROPLASTY Left 08/07/2021   Procedure: REVERSE SHOULDER ARTHROPLASTY;  Surgeon: Kay Kemps, MD;  Location: [facility];  Service: Orthopedics;  Laterality: Left;  with ISB    Current Outpatient Medications:    acetaminophen  (TYLENOL ) 325 MG tablet, Take 650 mg by mouth every 6 (six) hours as needed for mild pain (pain score 1-3) or moderate pain (  pain  score 4-6)., Disp: , Rfl:    albuterol  (PROVENTIL ) (2.5 MG/3ML) 0.083% nebulizer solution, Take 2.5 mg by nebulization every 6 (six) hours as needed for wheezing or shortness of breath., Disp: , Rfl:    ASPIRIN  LOW DOSE 81 MG tablet, TAKE 1 TABLET DAILY, Disp: 90 tablet, Rfl: 3   Boswellia-Glucosamine-Vit D (OSTEO BI-FLEX ONE PER DAY PO), Take 2 tablets by mouth daily., Disp: , Rfl:    cetirizine (ZYRTEC) 10 MG tablet, Take 10 mg by mouth daily., Disp: , Rfl:    Cholecalciferol  (VITAMIN D -3) 125 MCG (5000 UT) TABS, Take 5,000 Units by mouth daily., Disp: , Rfl:    Coenzyme Q10 (CO Q-10 PO), Take 10 mg by mouth daily., Disp: , Rfl:    empagliflozin  (JARDIANCE ) 25 MG TABS tablet, Take 25 mg by mouth daily., Disp: , Rfl:    furosemide  (LASIX ) 20 MG tablet, Take 1 tablet (20 mg total) by mouth daily as needed for edema or fluid (take in case of weight gain 2 to 3 lbs in 24 hrs or 5 lbs in  7 days.)., Disp: 30 tablet, Rfl: 0   hydrocortisone (ANUSOL-HC) 2.5 % rectal cream, as needed., Disp: , Rfl:    ipratropium (ATROVENT ) 0.03 % nasal spray, Place 2 sprays into both nostrils every 12 (twelve) hours., Disp: 30 mL, Rfl: 12   metoprolol  succinate (TOPROL -XL) 25 MG 24 hr tablet, Take 1 tablet (25 mg total) by mouth daily., Disp: 90 tablet, Rfl: 0   mirabegron  ER (MYRBETRIQ ) 50 MG TB24 tablet, Take 50 mg by mouth daily., Disp: , Rfl:    Multiple Vitamins-Minerals (AIRBORNE) PACK, Take 1 tablet by mouth 2 (two) times daily., Disp: , Rfl:    nitroGLYCERIN  (NITROSTAT ) 0.4 MG SL tablet, Place 1 tablet (0.4 mg total) under the tongue every 5 (five) minutes as needed for chest pain., Disp: 25 tablet, Rfl: 3   rosuvastatin  (CRESTOR ) 40 MG tablet, TAKE 1 TABLET BY MOUTH DAILY, Disp: 90 tablet, Rfl: 2   Tiotropium Bromide -Olodaterol 2.5-2.5 MCG/ACT AERS, Take 2 puffs by mouth daily., Disp: , Rfl:    triamcinolone  cream (KENALOG ) 0.1 %, Apply 1 Application topically 2 (two) times daily as needed (Rash)., Disp: , Rfl:     TURMERIC PO, Take 1,350 mg by mouth daily., Disp: , Rfl:    Vibegron (GEMTESA) 75 MG TABS, daily., Disp: , Rfl:    vitamin B-12 (CYANOCOBALAMIN) 1000 MCG tablet, Take 1,000 mcg by mouth daily., Disp: , Rfl:      11/23/2024    6:00 AM 05/24/2024   12:00 PM 07/04/2023    6:00 PM  MMSE - Mini Mental State Exam  Orientation to time 5 3 3   Orientation to Place 5 4 5   Registration 3 3 3   Attention/ Calculation 5 5 5   Recall 2 2 2   Language- name 2 objects 2 2 2   Language- repeat 1 1 1   Language- follow 3 step command 3 3 3   Language- read & follow direction 1 1 1   Write a sentence 1 1 1   Copy design 1 1 0  Total score 29 26 26       03/04/2021    3:00 PM  Montreal Cognitive Assessment   Visuospatial/ Executive (0/5) 2  Naming (0/3) 3  Attention: Read list of digits (0/2) 2  Attention: Read list of letters (0/1) 1  Attention: Serial 7 subtraction starting at 100 (0/3) 3  Language: Repeat phrase (0/2) 0  Language : Fluency (0/1) 0  Abstraction (  0/2) 2  Delayed Recall (0/5) 0  Orientation (0/6) 4  Total 17  Adjusted Score (based on education) 18   Neuroimaging: Brain MRI on 01/24/2021 revealed age-related atrophy without lobar predominance, as well as 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 Brain MRI on 11/14/2022 was stable. Brain MRI on 06/23/2024 suggested moderate to advanced generalized cerebral atrophy, mild microvascular ischemic disease, and small chronic infarcts within the bilateral cerebellar hemispheres, said to be unchanged relative to his 2023 scan.   Clinical Interview:   The following information was obtained during a clinical interview with Mr. Grima and his wife prior to cognitive testing.  Cognitive Symptoms: Decreased short-term memory: Endorsed. Previously, he appeared to diminish memory concerns but did acknowledge trouble recalling details of past conversations and names of familiar individuals. His wife reported similar concerns but noted these  being greater in magnitude and frequency. Similar examples were again provided today. Mr. Jarboe reported perceived stability relative to his July 2023 evaluation. His wife expressed concern for progressive decline while also acknowledging the presence of good and bad days. Decreased long-term memory: Denied. Decreased attention/concentration: Denied. Reduced processing speed: Denied. He previously reported a longstanding and unchanged trait of always being slower to digest and process information. His wife was in agreement at that time.  Difficulties with executive functions: Denied. His wife previously noted a longstanding weakness surrounding multi-tasking, noting that he has always needed to focus on a single task or risk performance declines. They denied trouble with impulsivity or any significant personality changes.  Difficulties with emotion regulation: Denied. Difficulties with receptive language: Denied. Difficulties with word finding: Denied. Decreased visuoperceptual ability: Denied.   Difficulties completing ADLs: Somewhat. He continues to manage his pillbox independently. His wife noted some increased confusion lately and has been providing additional supervision as of late. He attributed to confusion to his medical team adjusting medications. His wife clarified that this was only a single medication. His wife manages finances and bill paying, which is longstanding and unchanged in nature. He continues to drive. While his wife denied any safety concerns or concerns with the act of driving, she did highlight that she must provide navigational direction, especially during longer trips outside [place] .   Additional Medical History: History of traumatic brain injury/concussion: Unclear. His wife reported a history of falls, including some with potential head impact. I was unable to locate records which suggest prior concussion diagnoses. No head impacts since his previous  neuropsychological evaluation were reported.  History of stroke: Denied. History of seizure activity: Denied. History of known exposure to toxins: Denied. Symptoms of chronic pain: Endorsed. He reported ongoing chronic pain surrounding his shoulders and knees bilaterally.  Experience of frequent headaches/migraines: Denied. Frequent instances of dizziness/vertigo: Denied. He can have vertigo exacerbations, with the most recent experience occurring several months prior with symptoms lasting for about one week. Outside of this more acute timeframe, symptoms were said to be infrequent.    Sensory changes: He wears glasses with benefit. He has ongoing hearing loss and does utilize his hearing aids regularly with some benefit. Other sensory changes/difficulties (e.g., taste or smell) were denied.  Balance/coordination difficulties: Largely denied. At the time of his previous 2023 evaluation, he was reporting increased dizziness after bending over, which would impact his balance. Medical records suggest several falls in the past. He had fallen 3-4 months prior to his previous evaluation. However, he and his wife denied any recent falls. One side of the body was not said to be worse than  the other.  Other motor difficulties: Denied.  Sleep History: Estimated hours obtained each night: 5 hours.  Difficulties falling asleep: He reported occasional instances where he will have an overactive mind which can prevent him from falling asleep quickly. Difficulties staying asleep: He reported waking to use the restroom several times throughout the night and will sometimes have trouble falling back asleep.  Feels rested and refreshed upon awakening: Variably so depending on the quantity and quality of sleep obtained the night before.    History of snoring: Endorsed. His wife noted that these symptoms seem to have improved over time.  History of waking up gasping for air: Denied. Witnessed breath cessation while  asleep: Denied.   History of vivid dreaming: Denied. Excessive movement while asleep: Denied. Instances of acting out his dreams: Denied.  Psychiatric/Behavioral Health History: Depression: When asked his current mood, he reported some acute distress surrounding him learning about the passing of his brother-in-law very recently. He also alluded to some distress surrounding him now outliving the majority of his contemporaries and feeling as though he is the last one on the list. He has previously denied depressive experiences. Current or remote suicidal ideation, intent, or plan was denied.  Anxiety: Denied. Mania: Denied. Trauma History: Denied. Visual/auditory hallucinations: Denied. Delusional thoughts: Denied.   Tobacco: Denied. Alcohol: He reported infrequent alcohol consumption and denied a history of problematic alcohol abuse or dependence.  Recreational drugs: Denied.  Family History: Problem Relation Age of Onset   Heart disease Mother    Rheumatic fever Father    Rheumatic fever Brother    Diabetes Sister    Cancer Sister        cancer on eyelid   This information was confirmed by Mr. Oelke.  Academic/Vocational History: Highest level of educational attainment: 12 years. He graduated from high school and described himself as an average (B/B+) student in academic settings. No relative weaknesses were identified.  History of developmental delay: Denied. History of grade repetition: Denied. Enrollment in special education courses: Denied. History of LD/ADHD: Denied.   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 He was medically disabled in his 30s stemming from several heart attacks.   Evaluation Results:   Behavioral Observations: Mr. Coole was accompanied by his wife, arrived to his appointment on time, and was appropriately dressed and groomed. He appeared alert. Observed  gait and station were within normal limits. Gross motor functioning appeared intact upon informal observation and no abnormal movements (e.g., tremors) were noted. His affect was generally relaxed and positive. Spontaneous speech was fluent and word finding difficulties were not observed during the clinical interview. Thought processes were coherent, organized, and normal in content. Insight into his cognitive difficulties appeared somewhat limited and I do have concern that Mr. Montfort does not appreciate the full extent of ongoing cognitive dysfunction as seen across objective testing.   During testing, sustained attention was appropriate. Task engagement was adequate and he persisted when challenged. Overall, Mr. Sindelar was cooperative with the clinical interview and subsequent testing procedures.   Adequacy of Effort: The validity of neuropsychological testing is limited by the extent to which the individual being tested may be assumed to have exerted adequate effort during testing. Mr. Goeken expressed his intention to perform to the best of his abilities and exhibited adequate task engagement and persistence. Scores across stand-alone and embedded performance validity measures were within expectation. As such,  the results of the current evaluation are believed to be a valid representation of Mr. Spano current cognitive functioning.  Test Results: Mr. Florea was mildly disoriented at the time of the current evaluation. He was one day off when stating the current date and responded [facility] something when asked for the name of the current clinic.  Intellectual abilities based upon educational and vocational attainment were estimated to be in the average range. Premorbid abilities were estimated to be within the below average range based upon a single-word reading test.   Processing speed was variable, ranging from the well below average to average normative ranges. Basic attention was average. More  complex attention (e.g., working memory) was also average. Executive functioning was variable, ranging from the exceptionally low to average normative ranges. He performed in the above average range across a task assessing safety and judgment.   Assessed receptive language abilities were above average. Likewise, Mr. Breeden did not exhibit any difficulties comprehending task instructions and answered all questions asked of him appropriately. Assessed expressive language (e.g., verbal fluency and confrontation naming) was average to well above average.     Assessed visuospatial/visuoconstructional abilities were variable, ranging from the well below average to average normative ranges.    Learning (i.e., encoding) of novel verbal information was well below average to below average. Spontaneous delayed recall (i.e., retrieval) of previously learned information was commensurate with performances across learning trials. Retention rates were 0% across a list learning task, 44% across a story learning task, 73% across a daily living task, and 43% across a figure drawing task. Performance across recognition tasks was variable, ranging from the well below average to average normative ranges, suggesting some evidence for information consolidation.   Results of emotional screening instruments suggested that recent symptoms of generalized anxiety were in the minimal range, while symptoms of depression were within normal limits. A screening instrument assessing recent sleep quality suggested the presence of minimal sleep dysfunction.  Table of Scores:   Note: This summary of test scores accompanies the interpretive report and should not be considered in isolation without reference to the appropriate sections in the text. Descriptors are based on appropriate normative data and may be adjusted based on clinical judgment. Terms such as Within Normal Limits and Outside Normal Limits are used when a more specific  description of the test score cannot be determined. Descriptors refer to the current evaluation only.         Percentile - Normative Descriptor > 98 - Exceptionally High 91-97 - Well Above Average 75-90 - Above Average 25-74 - Average 9-24 - Below Average 2-8 - Well Below Average < 2 - Exceptionally Low         Orientation: March 2022 July 2023 Current     Raw Score Raw Score Raw Score Percentile   NAB Orientation, Form 1 --- 27/29 26/29 --- ---         Cognitive Screening:        Raw Score Raw Score Raw Score Percentile   SLUMS: --- 22/30 22/30 --- ---         Intellectual Functioning:        Standard Score Standard Score Standard Score Percentile   Test of Premorbid Functioning: --- 84 89 23 Below Average         Memory:       NAB Memory Module, Form 1: T Score T Score T Score Percentile   List Learning         Total  Trials 1-3 12/36 (40) 9/36 (27) 10/36 (33) 5 Well Below Average    List B 2/12 (44) 1/12 (36) 1/12 (36) 8 Well Below Average    Short Delay Free Recall 0/12 (29) 1/12 (33) 2/12 (37) 9 Below Average    Long Delay Free Recall 1/12 (37) 0/12 (35) 0/12 (35) 7 Well Below Average    Retention Percentage 0 0 (30) 0 (30) 2 Well Below Average    Recognition Discriminability 0 3 (43) -1 (32) 4 Well Below Average  Story Learning         Immediate Recall 26/80 (31) 35/80 (38) 37/80 (39) 14 Below Average    Delayed Recall 8/40 (37) 15/40 (39) 11/40 (37) 9 Below Average    Retention Percentage 50 65 (44) 44 (36) 8 Well Below Average  Daily Living Memory         Immediate Recall 24/51 (35) 36/51 (49) 29/51 (42) 21 Below Average    Delayed Recall 5/17 (32) 10/17 (46) 8/17 (41) 18 Below Average    Retention Percentage 50 71 (45) 73 (46) 34 Average    Recognition Hits 6/10 9/10 (57) 7/10 (43) 25 Average          Scaled Score Scaled Score Raw Score (Scaled Score) Percentile   RBANS Figure Copy: 11 5 14/20 (5) 5 Well Below Average  RBANS Figure Recall: 5 7 6/20 (6) 9 Below  Average    RBANS Figure Recognition --- --- 6/8 58-68 Average         Attention/Executive Function:       Trail Making Test (TMT): T Score Raw Score (T Score) Raw Score (T Score) Percentile     Part A 54 43 secs., 0 errors (49) 52 secs., 0 errors (45) 31 Average    Part B 52 127 secs., 1 error (47) 158 secs., 2 errors (43) 25 Average           Scaled Score Scaled Score Scaled Score Percentile   RBANS Coding: 3 5 5 5  Well Below Average          Scaled Score Scaled Score Scaled Score Percentile   WAIS-5 Naming Speed Quantity: --- --- 11 63 Average         NAB Attention Module, Form 1: T Score T Score T Score Percentile     Digits Forward 61 51 43 25 Average    Digits Backwards 47 52 52 58 Average         D-KEFS Color-Word Interference Test: Raw Score (Scaled Score) Raw Score (Scaled Score) Raw Score (Scaled Score) Percentile     Color Naming --- 45 secs. (6) 51 secs. (4) 2 Well Below Average    Word Reading --- 33 secs. (7) 36 secs. (5) 5 Well Below Average    Inhibition --- 122 secs. (6) Discontinued --- Impaired      Total Errors --- 10 errors (5) --- --- ---    Inhibition/Switching --- 128 secs. (7) Not Attempted --- ---      Total Errors --- 20 errors (1) --- --- ---         D-KEFS Verbal Fluency Test: Raw Score (Scaled Score) Raw Score (Scaled Score) Raw Score (Scaled Score) Percentile     Letter Total Correct --- --- 30 (9) 37 Average    Category Total Correct --- --- 33 (11) 63 Average    Category Switching Total Correct --- --- 5 (3) 1 Exceptionally Low    Category Switching Accuracy --- --- 3 (4)  2 Well Below Average      Total Set Loss Errors --- --- 3 (9) 37 Average      Total Repetition Errors --- --- 5 (8) 25 Average         NAB Executive Functions Module, Form 1: T Score T Score T Score Percentile     Judgment --- 60 60 84 Above Average         Language:       Verbal Fluency Test: T Score Raw Score (T Score) Raw Score (T Score) Percentile     Phonemic  Fluency (FAS) 43 29 (46) 30 (46) 34 Average    Animal Fluency 50 22 (63) 21 (63) 91 Well Above Average          NAB Language Module, Form 1: T Score T Score T Score Percentile     Auditory Comprehension --- 56 58 79 Above Average    Naming 29/31 (52) 30/31 (62) 28/31 (47) 38 Average         Visuospatial/Visuoconstruction:        Raw Score Raw Score Raw Score Percentile   Clock Drawing: 8/10 10/10 10/10 --- Within Normal Limits  RBANS Line Orientation, Form A: 11/20 13/20 10/20  3-9 Well Below Average          Scaled Score Scaled Score Scaled Score Percentile   WAIS-IV Block Design: --- 11 11 63 Average         Mood and Personality:        Raw Score Raw Score Raw Score Percentile   Geriatric Depression Scale: --- 5 2 --- Within Normal Limits  Geriatric Anxiety Scale: --- 9 5 --- Minimal    Somatic --- 5 5 --- Minimal    Cognitive --- 2 0 --- Minimal    Affective --- 2 0 --- Minimal         Additional Questionnaires:        Raw Score Raw Score Raw Score Percentile   PROMIS Sleep Disturbance Questionnaire: --- 29 23 --- None to Slight   Informed Consent and Coding/Compliance:   The current evaluation represents a clinical evaluation for the purposes previously outlined by the referral source and is in no way reflective of a forensic evaluation.   Mr. Schubring was provided with a verbal description of the nature and purpose of the present neuropsychological evaluation. Also reviewed were the foreseeable risks and/or discomforts and benefits of the procedure, limits of confidentiality, and mandatory reporting requirements of this provider. The patient was given the opportunity to ask questions and receive answers about the evaluation. Oral consent to participate was provided by the patient.   This evaluation was conducted by Arthea KYM Maryland, Ph.D., ABPP-CN, board certified clinical neuropsychologist. Mr. Beavers completed a clinical interview with Dr. Maryland, billed as one unit [phone], and 135  minutes of cognitive testing and scoring, billed as one unit [phone] and three additional units [id number]. Psychometrist Lonell Jude, B.S. assisted Dr. Maryland with test administration and scoring procedures. As a separate and discrete service, one unit [phone] and two units 96133 (175 minutes) were billed for Dr. Loralee time spent in interpretation and report writing.      "

## 2024-12-18 NOTE — Progress Notes (Signed)
" ° °  Psychometrician Note   Cognitive testing was administered to Steven Newman by Lonell Jude, B.S. (psychometrist) under the supervision of Dr. Arthea KYM Newman, Ph.D., ABPP, licensed psychologist on 12/18/2024. Steven Newman did not appear overtly distressed by the testing session per behavioral observation or responses across self-report questionnaires. Rest breaks were offered.   The battery of tests administered was selected by Dr. Arthea KYM Newman, Ph.D., ABPP with consideration to Steven Newman current level of functioning, the nature of his symptoms, emotional and behavioral responses during interview, level of literacy, observed level of motivation/effort, and the nature of the referral question. This battery was communicated to the psychometrist. Communication between Dr. Arthea KYM Newman, Ph.D., ABPP and the psychometrist was ongoing throughout the evaluation and Dr. Arthea KYM Newman, Ph.D., ABPP was immediately accessible at all times. Dr. Zachary C. Merz, Ph.D., ABPP provided supervision to the psychometrist on the date of this service to the extent necessary to assure the quality of all services provided.    Steven Newman will return within approximately 1-2 weeks for an interactive feedback session with Steven Newman at which time his test performances, clinical impressions, and treatment recommendations will be reviewed in detail. Steven Newman understands he can contact our office should he require our assistance before this time.  A total of 135 minutes of billable time were spent face-to-face with Steven Newman by the psychometrist. This includes both test administration and scoring time. Billing for these services is reflected in the clinical report generated by Dr. Arthea KYM Newman, Ph.D., ABPP  This note reflects time spent with the psychometrician and does not include test scores or any clinical interpretations made by Steven Newman. The full report will follow in a separate note. "

## 2024-12-19 ENCOUNTER — Encounter: Payer: Self-pay | Admitting: Psychology

## 2024-12-26 ENCOUNTER — Institutional Professional Consult (permissible substitution): Payer: Self-pay | Admitting: Psychology

## 2024-12-26 ENCOUNTER — Ambulatory Visit: Payer: Self-pay

## 2025-01-01 ENCOUNTER — Ambulatory Visit (INDEPENDENT_AMBULATORY_CARE_PROVIDER_SITE_OTHER): Admitting: Psychology

## 2025-01-01 DIAGNOSIS — F0670 Mild neurocognitive disorder due to known physiological condition without behavioral disturbance: Secondary | ICD-10-CM | POA: Diagnosis not present

## 2025-01-01 NOTE — Progress Notes (Signed)
"  ° °  Neuropsychology Feedback Session Jolynn DEL. [facility] [place] Department of Neurology  Reason for Referral:   Steven Newman is a 84 y.o. right-handed Caucasian male referred by Camie Sevin, PA-C, to characterize his current cognitive functioning and assist with diagnostic clarity and treatment planning in the context of subjective cognitive decline.   Feedback:   [name] completed a comprehensive neuropsychological evaluation on 12/18/2024. Please refer to that encounter for the full report and recommendations. Briefly, results suggested performance variability surrounding processing speed, executive functioning, visuospatial abilities, and recognition/consolidation aspects of memory. A relative weakness was also identified across both encoding (i.e., learning) and delayed retrieval aspects of memory. Relative to his previous evaluations in March 2022 and July 2023, an isolated decline was exhibited across a figure drawing task. More subtle decline is also argued across processing speed, basic attention, and executive functioning. Specific to memory, retention rates in 2022 across verbal memory tasks were 0%, 50%, and 50% across list, story, and daily living tasks respectively. They were 0%, 44%, and 73% currently. While this continues to suggest overall memory impairment, performances are fairly similar between the two assessed time points. 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 He also has numerous medical ailments, both cardiovascular and otherwise, which can negatively influence cognitive functioning. Numerous vascular and complex medical conditions would be reasonably expected to produce variability and/or weakness across testing in the areas exhibited by [name]. A decent degree of stability over time would also be expected. As such, this represents the most likely etiology for ongoing dysfunction. It  is also worth highlighting that research has shown that individuals with Agent Orange exposure may be at an increased risk for cognitive decline. The influence of this in his current clinical presentation is plausible but unknown. Despite memory dysfunction/impairment, patterns across testing do not suggest underlying Alzheimer's disease in a particularly compelling fashion in my opinion.  [name] was accompanied by his wife during the current telephone call. They were within their residence while I was within my office. I discussed the limitations of evaluation and management by telemedicine and the availability of in person appointments. [name] expressed his understanding and agreed to proceed. Content of the current session focused on the results of his neuropsychological evaluation. [name] was given the opportunity to ask questions and his questions were answered. He was encouraged to reach out should additional questions arise. His wife was able to print a copy of the report via MyChart while on the phone.     One unit 96132 (33 minutes) was billed for Dr. Loralee time spent preparing for, conducting, and documenting the current feedback session with [name]. "

## 2025-01-07 ENCOUNTER — Encounter: Payer: Self-pay | Admitting: Psychology

## 2025-01-09 ENCOUNTER — Ambulatory Visit: Admitting: Podiatry

## 2025-01-29 ENCOUNTER — Ambulatory Visit: Admitting: Podiatry

## 2025-03-19 ENCOUNTER — Ambulatory Visit: Admitting: Cardiovascular Disease

## 2025-06-04 ENCOUNTER — Ambulatory Visit: Payer: Self-pay | Admitting: Physician Assistant
